# Patient Record
Sex: Male | Born: 1970
Health system: Southern US, Community
[De-identification: ages and names within clinical notes are randomized; demographics above are authoritative.]

## PROBLEM LIST (undated history)

## (undated) DIAGNOSIS — E785 Hyperlipidemia, unspecified: Secondary | ICD-10-CM

## (undated) DIAGNOSIS — Z87442 Personal history of urinary calculi: Secondary | ICD-10-CM

## (undated) DIAGNOSIS — I10 Essential (primary) hypertension: Secondary | ICD-10-CM

## (undated) DIAGNOSIS — K219 Gastro-esophageal reflux disease without esophagitis: Secondary | ICD-10-CM

## (undated) DIAGNOSIS — I472 Ventricular tachycardia, unspecified: Secondary | ICD-10-CM

## (undated) DIAGNOSIS — E039 Hypothyroidism, unspecified: Secondary | ICD-10-CM

## (undated) DIAGNOSIS — M169 Osteoarthritis of hip, unspecified: Secondary | ICD-10-CM

## (undated) DIAGNOSIS — J40 Bronchitis, not specified as acute or chronic: Secondary | ICD-10-CM

## (undated) DIAGNOSIS — D759 Disease of blood and blood-forming organs, unspecified: Secondary | ICD-10-CM

## (undated) DIAGNOSIS — Z973 Presence of spectacles and contact lenses: Secondary | ICD-10-CM

## (undated) DIAGNOSIS — F32A Depression, unspecified: Secondary | ICD-10-CM

## (undated) DIAGNOSIS — F1010 Alcohol abuse, uncomplicated: Secondary | ICD-10-CM

## (undated) DIAGNOSIS — E538 Deficiency of other specified B group vitamins: Secondary | ICD-10-CM

## (undated) HISTORY — DX: Essential (primary) hypertension: I10

## (undated) HISTORY — DX: Depression, unspecified: F32.A

## (undated) HISTORY — DX: Ventricular tachycardia: I47.2

## (undated) HISTORY — DX: Alcohol abuse, uncomplicated: F10.10

## (undated) HISTORY — DX: Deficiency of other specified B group vitamins: E53.8

## (undated) HISTORY — DX: Bronchitis, not specified as acute or chronic: J40

## (undated) HISTORY — PX: OTHER SURGICAL HISTORY: SHX169

## (undated) HISTORY — DX: Hypothyroidism, unspecified: E03.9

## (undated) HISTORY — PX: TOTAL HIP ARTHROPLASTY: SHX124

## (undated) HISTORY — DX: Ventricular tachycardia, unspecified: I47.20

## (undated) HISTORY — DX: Hyperlipidemia, unspecified: E78.5

---

## 1998-07-18 ENCOUNTER — Ambulatory Visit (HOSPITAL_COMMUNITY): Admission: RE | Admit: 1998-07-18 | Discharge: 1998-07-18 | Payer: Self-pay

## 2003-07-05 ENCOUNTER — Encounter: Admission: RE | Admit: 2003-07-05 | Discharge: 2003-07-05 | Payer: Self-pay | Admitting: Family Medicine

## 2006-03-08 ENCOUNTER — Inpatient Hospital Stay (HOSPITAL_COMMUNITY): Admission: RE | Admit: 2006-03-08 | Discharge: 2006-03-11 | Payer: Self-pay | Admitting: Orthopedic Surgery

## 2006-03-13 ENCOUNTER — Encounter: Admission: RE | Admit: 2006-03-13 | Discharge: 2006-05-14 | Payer: Self-pay | Admitting: Orthopedic Surgery

## 2010-03-12 ENCOUNTER — Encounter: Payer: Self-pay | Admitting: Family Medicine

## 2010-04-11 ENCOUNTER — Inpatient Hospital Stay (HOSPITAL_COMMUNITY)
Admission: EM | Admit: 2010-04-11 | Discharge: 2010-04-13 | DRG: 310 | Disposition: A | Discharge status: Home / Self Care | Payer: 59 | Attending: Cardiology | Admitting: Cardiology

## 2010-04-11 DIAGNOSIS — I10 Essential (primary) hypertension: Secondary | ICD-10-CM | POA: Diagnosis present

## 2010-04-11 DIAGNOSIS — I472 Ventricular tachycardia, unspecified: Principal | ICD-10-CM | POA: Diagnosis present

## 2010-04-11 DIAGNOSIS — F1010 Alcohol abuse, uncomplicated: Secondary | ICD-10-CM | POA: Diagnosis present

## 2010-04-11 DIAGNOSIS — F17200 Nicotine dependence, unspecified, uncomplicated: Secondary | ICD-10-CM | POA: Diagnosis present

## 2010-04-11 DIAGNOSIS — IMO0001 Reserved for inherently not codable concepts without codable children: Secondary | ICD-10-CM

## 2010-04-11 DIAGNOSIS — R002 Palpitations: Secondary | ICD-10-CM

## 2010-04-11 DIAGNOSIS — I4891 Unspecified atrial fibrillation: Secondary | ICD-10-CM | POA: Diagnosis present

## 2010-04-11 DIAGNOSIS — I4729 Other ventricular tachycardia: Principal | ICD-10-CM | POA: Diagnosis present

## 2010-04-11 DIAGNOSIS — E039 Hypothyroidism, unspecified: Secondary | ICD-10-CM | POA: Diagnosis present

## 2010-04-11 DIAGNOSIS — R5381 Other malaise: Secondary | ICD-10-CM

## 2010-04-11 DIAGNOSIS — R5383 Other fatigue: Secondary | ICD-10-CM

## 2010-04-11 DIAGNOSIS — E785 Hyperlipidemia, unspecified: Secondary | ICD-10-CM | POA: Diagnosis present

## 2010-04-11 DIAGNOSIS — E876 Hypokalemia: Secondary | ICD-10-CM | POA: Diagnosis present

## 2010-04-11 DIAGNOSIS — I959 Hypotension, unspecified: Secondary | ICD-10-CM | POA: Diagnosis present

## 2010-04-11 LAB — POCT I-STAT, CHEM 8
BUN: 6 mg/dL (ref 6–23)
Calcium, Ion: 1.03 mmol/L — ABNORMAL LOW (ref 1.12–1.32)
Chloride: 104 mEq/L (ref 96–112)
Creatinine, Ser: 0.9 mg/dL (ref 0.4–1.5)
Glucose, Bld: 133 mg/dL — ABNORMAL HIGH (ref 70–99)
HCT: 42 % (ref 39.0–52.0)
Hemoglobin: 14.3 g/dL (ref 13.0–17.0)
Potassium: 2.8 mEq/L — ABNORMAL LOW (ref 3.5–5.1)
Sodium: 137 mEq/L (ref 135–145)
TCO2: 19 mmol/L (ref 0–100)

## 2010-04-11 LAB — TROPONIN I: Troponin I: 0.08 ng/mL — ABNORMAL HIGH (ref 0.00–0.06)

## 2010-04-11 LAB — MRSA PCR SCREENING: MRSA by PCR: NEGATIVE

## 2010-04-11 LAB — CK TOTAL AND CKMB (NOT AT ARMC): Relative Index: 0.7 (ref 0.0–2.5)

## 2010-04-11 LAB — POCT CARDIAC MARKERS
CKMB, poc: 1.4 ng/mL (ref 1.0–8.0)
Myoglobin, poc: 116 ng/mL (ref 12–200)

## 2010-04-12 ENCOUNTER — Encounter: Payer: Self-pay | Admitting: Internal Medicine

## 2010-04-12 DIAGNOSIS — I4729 Other ventricular tachycardia: Secondary | ICD-10-CM

## 2010-04-12 DIAGNOSIS — I472 Ventricular tachycardia: Secondary | ICD-10-CM

## 2010-04-12 DIAGNOSIS — I369 Nonrheumatic tricuspid valve disorder, unspecified: Secondary | ICD-10-CM

## 2010-04-12 LAB — T4, FREE: Free T4: 1.47 ng/dL (ref 0.80–1.80)

## 2010-04-12 LAB — CARDIAC PANEL(CRET KIN+CKTOT+MB+TROPI)
CK, MB: 3.5 ng/mL (ref 0.3–4.0)
CK, MB: 3.5 ng/mL (ref 0.3–4.0)
Relative Index: 0.8 (ref 0.0–2.5)
Total CK: 433 U/L — ABNORMAL HIGH (ref 7–232)

## 2010-04-12 LAB — BASIC METABOLIC PANEL
Calcium: 8.1 mg/dL — ABNORMAL LOW (ref 8.4–10.5)
Creatinine, Ser: 0.94 mg/dL (ref 0.4–1.5)
GFR calc Af Amer: >60 mL/min (ref >60)
GFR calc non Af Amer: >60 mL/min (ref >60)
Glucose, Bld: 102 mg/dL — ABNORMAL HIGH (ref 70–99)
Sodium: 137 mEq/L (ref 135–145)

## 2010-04-12 LAB — TSH
TSH: 4.316 u[IU]/mL (ref 0.350–4.500)
TSH: 4.298 u[IU]/mL (ref 0.350–4.500)

## 2010-04-12 LAB — T3, FREE: T3, Free: 3.2 pg/mL (ref 2.3–4.2)

## 2010-04-13 LAB — BASIC METABOLIC PANEL
BUN: 5 mg/dL — ABNORMAL LOW (ref 6–23)
Calcium: 8.9 mg/dL (ref 8.4–10.5)
Creatinine, Ser: 0.98 mg/dL (ref 0.4–1.5)
GFR calc non Af Amer: >60 mL/min (ref >60)
Glucose, Bld: 120 mg/dL — ABNORMAL HIGH (ref 70–99)
Potassium: 3.8 mEq/L (ref 3.5–5.1)

## 2010-04-13 LAB — CBC
MCHC: 33.7 g/dL (ref 30.0–36.0)
Platelets: 178 10*3/uL (ref 150–400)
RDW: 13 % (ref 11.5–15.5)
WBC: 4.8 10*3/uL (ref 4.0–10.5)

## 2010-04-14 ENCOUNTER — Ambulatory Visit (HOSPITAL_COMMUNITY)
Admission: RE | Admit: 2010-04-14 | Discharge: 2010-04-14 | Disposition: A | Discharge status: Home / Self Care | Payer: 59 | Source: Ambulatory Visit | Attending: Cardiology | Admitting: Cardiology

## 2010-04-14 ENCOUNTER — Telehealth (INDEPENDENT_AMBULATORY_CARE_PROVIDER_SITE_OTHER): Payer: Self-pay | Admitting: *Deleted

## 2010-04-14 DIAGNOSIS — I4729 Other ventricular tachycardia: Secondary | ICD-10-CM | POA: Insufficient documentation

## 2010-04-14 DIAGNOSIS — I472 Ventricular tachycardia, unspecified: Secondary | ICD-10-CM | POA: Insufficient documentation

## 2010-04-14 LAB — ALDOSTERONE: Aldosterone, Serum: <1 ng/dL

## 2010-04-14 MED ORDER — GADOPENTETATE DIMEGLUMINE 469.01 MG/ML IV SOLN
35.0000 mL | Freq: Once | INTRAVENOUS | Status: AC
  Administered 2010-04-14: 35 mL via INTRAVENOUS

## 2010-04-14 NOTE — H&P (Signed)
NAME:  Christian King, Christian King            ACCOUNT NO.:  [id number]  MEDICAL RECORD NO.:  [id number]           PATIENT TYPE:  E  LOCATION:  [facility]                         FACILITY:  [facility]  PHYSICIAN:  Madolyn Frieze. Jens Som, MD, FACCDATE OF BIRTH:  September 11, 1970  DATE OF ADMISSION:  04/11/2010 DATE OF DISCHARGE:                             HISTORY & PHYSICAL   PRIMARY CARDIOLOGIST:  New patient to [facility], currently being evaluated by Dr. Jens Som.  PRIMARY CARE PHYSICIAN:  Belva Agee, MD  CHIEF COMPLAINT:  Palpitations, lightheadedness, and flushed feeling.  HISTORY OF PRESENT ILLNESS:  This is a 40 year old gentleman with history of hypertension, hyperlipidemia, and questionable hypothyroidism who states he was in his usual state of health today until this afternoon when he developed feelings of hot flash, flushing, nausea, weakness, and a full sensation.  This morning, he had helped his fatherload furniture and then proceeded to eat a buffet lunch.  By the time he got home, it was around 2 o'clock, and he began to have a fullness in his stomach.  He went to pick up his son from school, and at that time, became mildly nauseous.  This was then followed by hot flashes or flushing sensation.  He and his son were getting their haircut when this happened.  He had to go outside because he felt like he was going to vomit.  He also noticed that he was having weakness.  Then, while driving, the patient states he noticed palpitations in his chest as well as a lightheadedness/dizziness.  He continued to home.  His palpitations increased in intensity and with the feeling of weakness and lightheadedness, he called EMS.    When EMS arrived, they found the patient to be in a wide complex tachycardia.   He was given two IV pushes of amiodarone 150 mg.  This was without relief.   In the emergency department, the patient still showed a wide complex tachycardia at a rate of 205 beats per minute.  His  systolic blood pressure was noted to be around 70.  Therefore, the patient received direct current cardioversion x1.  This broke the wide complex tachycardia, returning him to  atrial fibrillation with rates around 70 and systolic blood pressure around 110.  The patient was then placed on Cardizem and his heart rate remains between 90 to 110 beats per minute, still in atrial fibrillation.  The patient's followup EKG after shock did show atrial fibrillation  at a rate of 151 beats per minute.  Currently, telemetry reads atrial fibrillation at a rate of 110 beats per minute.  Initial lab work also revealed a potassium of 2.8.  The patient's symptoms are much improved.  He is still anxious but currently in no acute distress.  In speaking with the patient, he denies any dyspnea on exertion, orthopnea, PND, pedal edema, chest pain, history of syncope, or palpitations.  Cardiology was asked to evaluate this patient for further recommendations.    PAST MEDICAL HISTORY: 1. Hyperlipidemia. 2. Hypertension. 3. Questionable hypothyroidism. 4. Alcohol abuse. 5. Continued tobacco abuse. 6. Right hip surgery.  SOCIAL HISTORY:  The patient lives with his wife and  son.  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  He has a tobacco history and continues to smoke 2-3 cigarettes per day.  He does consume alcohol; this is at least a 6 pack a day or 6 shot of bourbon a day, or a bottle of wine a day depending on what the patient has around the house.  Drugs, no illicit drug use.  FAMILY HISTORY:  The patient's mother is alive but unsure of any coronary artery disease.  His father is alive without known coronary artery disease.  He has siblings with diabetes, but no known coronary artery disease.  ALLERGIES:  No known drug allergies.  HOME MEDICATIONS:  The patient unsure of dosage,  but does take lisinopril, Trilipix, lorazepam, and simvastatin.  REVIEW OF SYSTEMS:  All pertinent positives and negatives as stated in the HPI.  All other systems have been reviewed and are negative.  CODE STATUS:  Full.  PHYSICAL EXAMINATION:  VITAL SIGNS:  Pulse 110, respirations 28, blood pressure 104/78, O2 saturation 100% on 2 L. GENERAL:  This is a well-developed, well-nourished, middle-aged gentleman who appears slightly anxious.  He is in no acute distress. HEENT:  Normal. NECK:  Supple without bruit or JVD. HEART:  Regularly irregular without murmur, rub, or gallop noted. Pulses are 2+ and equal bilaterally. LUNGS:  Clear to auscultation bilaterally without wheezes, rales, or rhonchi. ABDOMEN:  Soft, nontender, positive bowel sounds x4. EXTREMITIES:  No clubbing, cyanosis, or edema. MUSCULOSKELETAL:  No joint deformities or effusions. NEURO:  Alert and oriented x3.  Cranial nerves II-XII grossly intact.  Radiology pending.  EKG as stated in the HPI.  Sodium 137, potassium 2.8, chloride 104, bicarb 19, BUN 6, creatinine 0.9.  Point-of-care markers negative x1.  ASSESSMENT AND PLAN:  This is a 40 year old gentleman with past medical history of hypertension, hyperlipidemia, and questionable hypothyroidism who presents with a wide complex tachycardia that appears to be right ventricular outflow tract ventricular tachycardia that converted to atrial fibrillation after direct current cardioversion.  Dr. Jens Som completed a bedside quick look echocardiogram that demonstrated normal left ventricular and right ventricular function.  He also reviewed the above with Dr. Graciela Husbands, electrophysiologist, by phone.  The plan was discussed and he will be admitted to the coronary care unit.  The patient's potassium will be supplemented and followed closely.  We will also check a magnesium level as we are unsure of the etiology of the hypokalemia at this point.  Question if this is related  to his significant alcohol history.  We will also check a serum renin and aldosterone level.  The patient was placed on Cardizem, and this will be continued along with IV heparin.  Hopefully, the patient will convert overnight to normal sinus rhythm.  We will check an echocardiogram as well as TSH. Electrophysiology evaluation has been scheduled for the morning.  We will keep the patient n.p.o. after midnight for further procedures per Electrophysiology recommendations.  With the patient's alcohol abuse on a daily basis, we will watch for withdrawal and prophylactically place the patient on a CIWA protocol.  Discussion for alcohol and tobacco cessation has been discussed with the wife and the patient, and they understand the importance of cessation.  Further treatment will be dependent upon the above.  Thank you for this evaluation.     Leonette Monarch, PA-C   ______________________________ Madolyn Frieze. Jens Som, MD,  [facility]    NB/MEDQ  D:  04/11/2010  T:  04/12/2010  Job:  [id number]  cc:   [facility] Belva Agee, MD  Electronically Signed by Alen Blew P.A. on 04/14/2010 03:23:44 PM Electronically Signed by Olga Millers MD [facility] on 04/14/2010 06:52:40 PM

## 2010-04-16 DIAGNOSIS — I4729 Other ventricular tachycardia: Secondary | ICD-10-CM

## 2010-04-16 DIAGNOSIS — I472 Ventricular tachycardia: Secondary | ICD-10-CM

## 2010-04-17 ENCOUNTER — Ambulatory Visit (HOSPITAL_COMMUNITY): Payer: 59 | Attending: Internal Medicine

## 2010-04-17 ENCOUNTER — Encounter: Payer: Self-pay | Admitting: Internal Medicine

## 2010-04-17 DIAGNOSIS — I4729 Other ventricular tachycardia: Secondary | ICD-10-CM | POA: Insufficient documentation

## 2010-04-17 DIAGNOSIS — R9430 Abnormal result of cardiovascular function study, unspecified: Secondary | ICD-10-CM

## 2010-04-17 DIAGNOSIS — I472 Ventricular tachycardia, unspecified: Secondary | ICD-10-CM | POA: Insufficient documentation

## 2010-04-17 DIAGNOSIS — R55 Syncope and collapse: Secondary | ICD-10-CM

## 2010-04-18 NOTE — Progress Notes (Signed)
Summary: Nuclear Pre-Procedure  Phone Note Outgoing Call Call back at [facility] Phone [phone]   Call placed by: Stanton Kidney, EMT-P,  April 14, 2010 12:25 PM Action Taken: Phone Call Completed Summary of Call: Left message with information on [employer] Information Sheet (see scanned document for details). Stanton Kidney, EMT-P  April 14, 2010 12:25 PM      Nuclear Med Background Indications for Stress Test: Evaluation for Ischemia, Post Hospital, Abnormal EKG   History: Echo  History Comments: 2/12 Echo: EF=50-55%, Abn septal motion   Symptoms: Dizziness, Light-Headedness, Near Syncope, Palpitations    Nuclear Pre-Procedure Cardiac Risk Factors: Hypertension, Lipids, Smoker

## 2010-04-20 LAB — RENIN: Renin Activity: 9 ng/mL/h — ABNORMAL HIGH (ref 0.25–5.82)

## 2010-04-26 DIAGNOSIS — I4729 Other ventricular tachycardia: Secondary | ICD-10-CM | POA: Insufficient documentation

## 2010-04-26 DIAGNOSIS — E538 Deficiency of other specified B group vitamins: Secondary | ICD-10-CM

## 2010-04-26 DIAGNOSIS — I10 Essential (primary) hypertension: Secondary | ICD-10-CM

## 2010-04-26 DIAGNOSIS — I472 Ventricular tachycardia, unspecified: Secondary | ICD-10-CM | POA: Insufficient documentation

## 2010-04-26 DIAGNOSIS — M109 Gout, unspecified: Secondary | ICD-10-CM

## 2010-04-26 DIAGNOSIS — E039 Hypothyroidism, unspecified: Secondary | ICD-10-CM | POA: Insufficient documentation

## 2010-04-26 DIAGNOSIS — E785 Hyperlipidemia, unspecified: Secondary | ICD-10-CM | POA: Insufficient documentation

## 2010-04-26 HISTORY — DX: Essential (primary) hypertension: I10

## 2010-04-26 HISTORY — DX: Deficiency of other specified B group vitamins: E53.8

## 2010-04-26 HISTORY — DX: Gout, unspecified: M10.9

## 2010-04-26 HISTORY — DX: Hyperlipidemia, unspecified: E78.5

## 2010-04-27 ENCOUNTER — Encounter (INDEPENDENT_AMBULATORY_CARE_PROVIDER_SITE_OTHER): Payer: 59 | Admitting: Internal Medicine

## 2010-04-27 ENCOUNTER — Encounter: Payer: Self-pay | Admitting: Internal Medicine

## 2010-04-27 ENCOUNTER — Other Ambulatory Visit: Payer: Self-pay | Admitting: Internal Medicine

## 2010-04-27 DIAGNOSIS — I10 Essential (primary) hypertension: Secondary | ICD-10-CM

## 2010-04-27 DIAGNOSIS — I4729 Other ventricular tachycardia: Secondary | ICD-10-CM

## 2010-04-27 DIAGNOSIS — I472 Ventricular tachycardia: Secondary | ICD-10-CM

## 2010-04-27 LAB — BASIC METABOLIC PANEL
BUN: 12 mg/dL (ref 6–23)
Calcium: 10.2 mg/dL (ref 8.4–10.5)
Creatinine, Ser: 1 mg/dL (ref 0.4–1.5)

## 2010-04-27 LAB — MAGNESIUM: Magnesium: 2.1 mg/dL (ref 1.5–2.5)

## 2010-04-27 NOTE — Assessment & Plan Note (Addendum)
Summary: Cardiology Nuclear Testing  Nuclear Med Background Indications for Stress Test: Evaluation for Ischemia, Post Hospital, Abnormal EKG   History: Echo  History Comments: 2/12 Echo: EF=50-55%, Abn septal motion   Symptoms: Dizziness, Light-Headedness, Near Syncope, Palpitations    Nuclear Pre-Procedure Cardiac Risk Factors: Hypertension, Lipids, Smoker Caffeine/Decaff Intake: none NPO After: 10:00 PM Lungs: clear IV 0.9% NS with Angio Cath: 18     IV Site: R Antecubital IV Started by: Stanton Kidney, EMT-P Chest Size (in) 40     Height (in): 68 Weight (lb): 169 BMI: 25.79 Tech Comments: Dr. Johney Frame wanted Wende Mott to be in the room while his exercise portion was being done. S.Weaver observed, with no problems.   Nuclear Med Study 1 or 2 day study:  1 day     Stress Test Type:  Stress Reading MD:  Dietrich Pates, MD     Referring MD:  J.Allred Resting Radionuclide:  Technetium 49m Tetrofosmin     Resting Radionuclide Dose:  11.0 mCi  Stress Radionuclide:  Technetium 51m Tetrofosmin     Stress Radionuclide Dose:  33.0 mCi   Stress Protocol Exercise Time (min):  8:16 min     Max HR:  176 bpm     Predicted Max HR:  179 bpm  Max Systolic BP: 164 mm Hg     Percent Max HR:  98.32 %     METS: 13.4 Rate Pressure Product:  18841    Stress Test Technologist:  Milana Na, EMT-P     Nuclear Technologist:  Doyne Keel, CNMT  Rest Procedure  Myocardial perfusion imaging was performed at rest 45 minutes following the intravenous administration of Technetium 50m Tetrofosmin.  Stress Procedure  The patient exercised for 8:16. The patient stopped due to fatigue and denied any chest pain.  There were non specific ST-T wave changes.  Technetium 64m Tetrofosmin was injected at peak exercise and myocardial perfusion imaging was performed after a brief delay.  QPS Raw Data Images:  Rest images were motion corrected.  Soft tissue (diaphragm) underlies heart. Stress Images:  Thinning in  the inferior wall (base, mid)  Otherwise normal perfusion. Rest Images:  Mild improvemnt in the inferior  base.  Otherwise no significant change. Subtraction (SDS):  No significant ischemia. Transient Ischemic Dilatation:  0.83  (Normal <1.22)  Lung/Heart Ratio:  0.34  (Normal <0.45)  Quantitative Gated Spect Images QGS EDV:  86 ml QGS ESV:  33 ml QGS EF:  62 %   Overall Impression  Exercise Capacity: Good exercise capacity. BP Response: Normal blood pressure response. Clinical Symptoms: No chest pain ECG Impression: No significant ST segment change suggestive of ischemia. Overall Impression: Probable normal perfusion and soft tissue attenuation (diaphragm)  Minimal change in the inferior base.  Does not appeart to be signif for ischemia.  No evidence of scar.  Appended Document: Cardiology Nuclear Testing low risk study please inform pt  Appended Document: Cardiology Nuclear Testing pt aware

## 2010-05-02 NOTE — Consult Note (Addendum)
Summary: Consultation Report  Consultation Report   Imported By: Earl Many 04/28/2010 10:36:47  _____________________________________________________________________  External Attachment:    Type:   Image     Comment:   External Document

## 2010-05-02 NOTE — Assessment & Plan Note (Signed)
Summary: eph. per rhonda pa.gd   Visit Type:  Follow-up Primary Provider:  Belva Agee   History of Present Illness: The patient presents today for routine electrophysiology followup. He reports doing very well since last being seen in our clinic.  He denies symptomatic VT.  The patient denies symptoms of palpitations, chest pain, shortness of breath, orthopnea, PND, lower extremity edema, dizziness, presyncope, syncope, or neurologic sequela. The patient is tolerating medications without difficulties and is otherwise without complaint today.   Current Medications (verified): 1)  Folic Acid 1 Mg Tabs (Folic Acid) .Marland Kitchen.. 1 By Mouth Daily 2)  Aspirin 81 Mg  Tabs (Aspirin) .Marland Kitchen.. 1 By Mouth Daily 3)  Magnesium Oxide 400 Mg Tabs (Magnesium Oxide) .Marland Kitchen.. 1 By Mouth Daily 4)  Metoprolol Succinate 50 Mg Xr24h-Tab (Metoprolol Succinate) .Marland Kitchen.. 1 By Mouth Daily 5)  Klor-Con 10 10 Meq Cr-Tabs (Potassium Chloride) .Marland Kitchen.. 1 By Mouth Daily 6)  Thiamine Hcl 100 Mg Tabs (Thiamine Hcl) .Marland Kitchen.. 1 By Mouth Daily 7)  Lisinopril 20 Mg Tabs (Lisinopril) .Marland Kitchen.. 1 By Mouth Daily 8)  Simvastatin 20 Mg Tabs (Simvastatin) .Marland Kitchen.. 1 By Mouth Every 3 Day 9)  Trilipix 45 Mg Cpdr (Choline Fenofibrate) .... Every 3 Days 10)  Vitamin D3 50000 Unit Caps (Cholecalciferol) .Marland Kitchen.. 1 By Mouth Weekly  Allergies (verified): No Known Drug Allergies  Past History:  Past Medical History: Reviewed history from 04/26/2010 and no changes required. VENTRICULAR TACHYCARDIA HYPERLIPIDEMIA  HYPERTENSION B12 DEFICIENCY GOUT HYPOTHYROIDISM  Past Surgical History: Reviewed history from 04/26/2010 and no changes required. 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      Social History:  The patient lives with his spouse.  He is unemployed.   He has a history of tobacco use and presently smokes 2-3 cigarettes per   day.  He consumes large amounts of alcohol at least a 6 packs of beer a  day or 6 shots of  bourbon a day, but has quit since hospital discharge.  He denies drug use.   Vital Signs:  Patient profile:   40 year old male Height:      68 inches Weight:      169 pounds BMI:     25.79 Pulse rate:   66 / minute Resp:     16 per minute BP sitting:   104 / 67  (right arm)  Vitals Entered By: Marrion Coy, CNA (April 27, 2010 10:36 AM)  Physical Exam  General:  Well developed, well nourished, in no acute distress. Head:  normocephalic and atraumatic Eyes:  PERRLA/EOM intact; conjunctiva and lids normal. Mouth:  Teeth, gums and palate normal. Oral mucosa normal. Neck:  supple Lungs:  Clear bilaterally to auscultation and percussion. Heart:  Non-displaced PMI, chest non-tender; regular rate and rhythm, S1, S2 without murmurs, rubs or gallops. Carotid upstroke normal, no bruit. Normal abdominal aortic size, no bruits. Femorals normal pulses, no bruits. Pedals normal pulses. No edema, no varicosities. Abdomen:  Bowel sounds positive; abdomen soft and non-tender without masses, organomegaly, or hernias noted. No hepatosplenomegaly. Msk:  mildly tender over 1st metatarsal L foot Extremities:  No clubbing or cyanosis. Neurologic:  Alert and oriented x 3.   Impression & Recommendations:  Problem # 1:  VENTRICULAR TACHYCARDIA (ICD-427.1) doing well after recent hospital discharge without any further symptoms of VT Echo, MRI, and GXT were all low risk. We discussed EP study and ablation as an option today.  He requests to continue conservative management with  metoprolol and will consider EP study if VT recurs. No driving x 6 months (pt aware) ETOH cessation we will recheck BMET and Mg today  Problem # 2:  HYPERTENSION (ICD-401.9) stable no changes  Other Orders: TLB-BMP (Basic Metabolic Panel-BMET) (80048-METABOL) TLB-Magnesium (Mg) (83735-MG)  Patient Instructions: 1)  Your physician recommends that you return for lab work in: bmet and mg today...we will call you with  results 2)  No driving or alcohol use 3)  Your physician recommends that you schedule a follow-up appointment in: 3 months with Dr.Rocklin Soderquist

## 2010-05-04 ENCOUNTER — Encounter: Payer: 59 | Admitting: Internal Medicine

## 2010-05-05 NOTE — Discharge Summary (Signed)
NAME:  Christian King, Christian King            ACCOUNT NO.:  [id number]  MEDICAL RECORD NO.:  [id number]           PATIENT TYPE:  I  LOCATION:  2913                         FACILITY:  [facility]  PHYSICIAN:  Hillis Range, MD       DATE OF BIRTH:  1970-11-25  DATE OF ADMISSION:  04/11/2010 DATE OF DISCHARGE:  04/13/2010                              DISCHARGE SUMMARY   PROCEDURES:  A 2-D echocardiogram.  PRIMARY FINAL DISCHARGE DIAGNOSIS:  Ventricular tachycardia, rule out left ventricular outflow tract.  SECONDARY DIAGNOSES: 1. History of ethyl alcohol abuse. 2. Hypertension. 3. Hyperlipidemia. 4. Questionable history of hypothyroidism with TFTs within normal     limits this admission. 5. Tobacco abuse. 6. Status post vascularized fibular graft to his right hip for     avascular necrosis. 7. History of gout. 8. History of B12 deficiency. 9. Family history of sudden death in the paternal great grandmother     and two paternal uncles, no further details available.  TIME AT DISCHARGE:  Forty eight minutes.  HOSPITAL COURSE:  Christian King is a 40 year old male with no previous history of coronary artery disease.  He came to the hospital on the day of admission for palpitations with lightheadedness and flushed feeling. He was given amiodarone by EMS and then became hypotensive.  At that time, his heart rate was 205.  He received direct current cardioversion and was been in atrial fibrillation.  He was placed on Cardizem.  His blood pressure improved.  His initial heart rate in the atrial fibrillation was 150, but was slowed by IV Cardizem.  He was admitted for further evaluation.  His thyroid function test was within normal limits.  His initial potassium was 2.8, but was supplemented.  He had some elevated blood sugars between 102 and 133, but no hemoglobin A1c was done.  Magnesium was 1.5.  his potassium was extensively supplemented and improved.  His potassium level was 3.8 at discharge.   Thyroid function tests were within normal limits.  He spontaneously converted to sinus rhythm.  He had a serum rennin and aldosterone level performed.  These are both pending at the time of dictation.  They can be followed as an outpatient.  He spontaneously converted to sinus rhythm and the Cardizem was discontinued.  Cardiac enzymes were cycled and he had some elevation in his CKs between 427 and 513.  His troponins ranged between 0.08 and 0.13.  His MBs were all within normal limits.  After he converted to sinus rhythm, the Cardizem drip was discontinued.  On April 12, 2010, he was seen by Dr. Johney Frame.  He had a signal average EKG, which had some abnormalities.  He had an echocardiogram to evaluate his left ventricular and right ventricular function.  His left ventricular ejection fraction was 50-55% with no wall motion abnormalities.  He had no significant valvular abnormalities.  The right ventricular wall thickness and function was normal.  He was started on a beta-blocker and his final dosage was Toprol-XL 50 mg a day.  It was recommended that he continue on potassium to keep his level greater than 4 and magnesium to keep  his level greater than 2.  A smoking cessation consult was called.  On April 13, 2010, he was evaluated by Dr. Johney Frame.  He had no further arrhythmias.  He had no symptoms of volume overloaded.  Signal average EKG was reviewed by Dr. Graciela Husbands and because of that, he will need cardiac MRI and an outpatient stress test.  Dr. Johney Frame felt that Christian King was stable for discharge on April 13, 2010 in improved condition with close followup arranged.  DISCHARGE INSTRUCTIONS: 1. His activity level is to be increased gradually with no lifting or     driving for 4 weeks. 2. He is to be at [facility] on April 14, 2010 at 8:34, cardiac     MRI.  He is to check in, it is moving. 3. He is not to use alcohol at all. 4. He is encouraged not to smoke. 5. He is to  get a stress test on Monday April 17, 2010 and be at     the office at 8:15. 6. He is not to eat or drink after midnight, no caffeine or decaf for     24 hours before and he is not to take a.m. meds. 7. He is to follow up with Dr. Johney Frame on May 04, 2010 at 3:15 and     with Dr. Tania Ade as needed.  DISCHARGE MEDICATIONS: 1. Lisinopril 20 mg a day. 2. Fish oil OTC two capsules daily as prior to admission. 3. Toprol-XL 50 mg a day. 4. Trilipix 45 mg q.3 days. 5. Simvastatin 20 mg every 3 days as prior to admission. 6. Aspirin 81 mg a day. 7. K-Dur 10 mEq daily. 8. Mag-Ox 400 mg daily. 9. Thiamine 100 mg daily. 10.Folic acid 1 mg daily. 11.Vitamin D2 50,000 units on Sundays.     Theodore Demark, PA-C   ______________________________ Hillis Range, MD    RB/MEDQ  D:  04/13/2010  T:  04/13/2010  Job:  [id number]  cc:   Dr. Belva Agee  Electronically Signed by Theodore Demark PA-C on 04/17/2010 03:29:37 PM Electronically Signed by Hillis Range MD on 05/05/2010 10:51:57 PM

## 2010-05-05 NOTE — Consult Note (Signed)
NAME:  Christian King, Christian King            ACCOUNT NO.:  [id number]  MEDICAL RECORD NO.:  [id number]           PATIENT TYPE:  LOCATION:                                 FACILITY:  PHYSICIAN:  Christian Range, MD       DATE OF BIRTH:  05/25/1970  DATE OF CONSULTATION: DATE OF DISCHARGE:                                CONSULTATION   REQUESTING PHYSICIAN:  Madolyn Frieze. Jens Som, MD, [facility]  REASON FOR CONSULTATION:  Wide-complex tachycardia.  HISTORY OF PRESENT ILLNESS:  Christian King is a pleasant 40 year old gentleman with a history of heavy alcohol dependence who was admitted on April 11, 2010, with a wide-complex tachycardia.  The patient reports being in his usual state of health until the afternoon when he began having diaphoresis, flushing, and nausea.  He subsequently developed tachy palpitations with associated lightheadedness and dizziness.  He presented to [facility] where he was found to have a wide- complex tachycardia at 205 beats per minute.  He was mildly hypotensive and therefore was cardioverted.  He cardioverted to atrial fibrillation but subsequently converted to sinus rhythm.  He has maintained sinus rhythm since that time.  Presently, he is resting comfortably and is without complaint.  He denies chest pain, shortness of breath, presyncope, syncope, or other concerns.  He denies any prior exertional symptoms.  He has not had syncope in the past.  HOME MEDICATIONS:  Lisinopril, Trilipix, lorazepam, and simvastatin.  ALLERGIES:  No known drug allergies.  PAST MEDICAL HISTORY: 1. Alcohol abuse. 2. Hypertension. 3. Hyperlipidemia. 4. Ongoing tobacco use. 5. Prior right hip surgery.  SOCIAL HISTORY:  The patient lives with his spouse.  He is unemployed. He has a history of tobacco use and presently smokes 2-3 cigarettes per day.  He consumes large amounts of alcohol at least a 6 packs of beer a day or 6 shots of bourbon a day.  He denies drug use.  FAMILY  HISTORY:  The patient's parents are healthy.  He has a sibling with diabetes but no known coronary disease.  His paternal great grandmother died suddenly at the young age.  He also has two paternal uncles who died suddenly in their 30s or 18s.  REVIEW OF SYSTEMS:  All systems were reviewed and negative except as outlined in the HPI above.  PHYSICAL EXAMINATION:  TELEMETRY:  Sinus rhythm at 74 beats per minute with no arrhythmias observed. VITALS:  Blood pressure 125/70, heart rate 74, respirations 16, sats 97% on room air, afebrile. GENERAL:  The patient is a well-appearing male in no acute distress.  He is alert and oriented x3. HEENT:  Normocephalic, atraumatic.  Sclerae clear.  Conjunctivae pink. Oropharynx clear. NECK:  Supple.  No thyromegaly, JVD, or bruits. LUNGS:  Clear to auscultation bilaterally. HEART:  Regular rate and rhythm.  No murmurs, rubs, or gallops. GI:  Soft, nontender, nondistended.  Positive bowel sounds. EXTREMITIES:  No clubbing, cyanosis, or edema. SKIN:  No ecchymoses or lacerations. MUSCULOSKELETAL:  No deformity or atrophy. PSYCHIATRIC:  Euthymic mood.  Full affect. NEUROLOGIC:  Strength and sensation are intact.  EKG from this morning reveals sinus bradycardia with  heart rate of 54 beats per minute with T-wave inversions in V1 through V3, without specific abnormalities observed.  The QTc is 124.  I reviewed an EKG during his tachycardia from April 11, 2010, at 1739, which reveals a wide-complex tachycardia.  According to VT criteria, this is more consistent with a supraventricular tachycardia than a ventricular tachycardia.  The tachycardia has a left bundle- branch inferior axis morphology and could be potentially consistent with a right ventricular outflow tract tachycardia.  Echo is pending.  Cardiac markers are reviewed.  Potassium now 3.6.  Upon initial presentation, his potassium was 2.8.  Creatinine is 0.9.  TSH 4.3.  IMPRESSION:  Mr.  King is a 40 year old gentleman with a history of alcohol dependence who now presents in the setting of hypokalemia with a wide-complex tachycardia.  I have reviewed the wide-complex tachycardia and discussed its morphology with Dr. Graciela Husbands.  We both agree that this is more of a supraventricular tachycardia morphology and therefore may represent an aberrantly conducted SVT; however, certainly the morphology could also be consistent with a right ventricular outflow tract ventricular tachycardia.  At this time, I think the most prudent step is to obtain an echocardiogram to evaluate for structural heart disease. We will also obtain a signal averaged EKG.  If these are normal, then I think the patient can be discharged and have an outpatient stress Myoview.  If his signal average EKG or echo revealed high-risk features for a right ventricular dysplasia, then we could proceed with cardiac MRI at that time.  I have initiated the patient on Toprol-XL 50 mg daily today.  We will replete his magnesium and potassium.  In addition, I have made it very clear that the patient should make efforts to discontinue alcohol consumption.  I have also been very clear that the patient should not drive until he follows up with me in the office.  I will see the patient again in the office in 4 weeks.  He will contact our office immediately should he have syncope or any other problems.     Christian Range, MD     JA/MEDQ  D:  04/12/2010  T:  04/12/2010  Job:  [id number]  cc:   Madolyn Frieze. Jens Som, MD, [facility]  Electronically Signed by Christian Range MD on 05/05/2010 10:52:01 PM

## 2010-05-16 ENCOUNTER — Telehealth: Payer: Self-pay | Admitting: Internal Medicine

## 2010-05-16 NOTE — Telephone Encounter (Signed)
Pt needs metoprolol [facility]# [phone]

## 2010-05-17 ENCOUNTER — Other Ambulatory Visit: Payer: Self-pay

## 2010-05-17 DIAGNOSIS — I10 Essential (primary) hypertension: Secondary | ICD-10-CM

## 2010-05-17 MED ORDER — METOPROLOL SUCCINATE ER 50 MG PO TB24: 50.0000 mg | ORAL_TABLET | Freq: Every day | ORAL | Status: DC

## 2010-05-17 NOTE — Telephone Encounter (Signed)
Pt wife states pt needs meds to be called pt does not have anymore meds. Pt wife # [phone] wants to know when rx is called in.

## 2010-05-18 ENCOUNTER — Other Ambulatory Visit: Payer: Self-pay | Admitting: *Deleted

## 2010-05-18 MED ORDER — MAGNESIUM OXIDE 400 MG PO TABS: 400.0000 mg | ORAL_TABLET | Freq: Every day | ORAL | Status: AC

## 2010-06-14 ENCOUNTER — Telehealth: Payer: Self-pay | Admitting: Internal Medicine

## 2010-06-14 DIAGNOSIS — I10 Essential (primary) hypertension: Secondary | ICD-10-CM

## 2010-06-14 NOTE — Telephone Encounter (Signed)
Pt needs metoprolol to be called in to [facility] [address] # [phone].

## 2010-06-15 ENCOUNTER — Other Ambulatory Visit: Payer: Self-pay | Admitting: Internal Medicine

## 2010-06-15 DIAGNOSIS — I10 Essential (primary) hypertension: Secondary | ICD-10-CM

## 2010-06-15 MED ORDER — METOPROLOL SUCCINATE ER 50 MG PO TB24: 50.0000 mg | ORAL_TABLET | Freq: Every day | ORAL | Status: DC

## 2010-06-19 MED ORDER — METOPROLOL SUCCINATE ER 50 MG PO TB24: 50.0000 mg | ORAL_TABLET | Freq: Every day | ORAL | Status: DC

## 2010-06-19 NOTE — Telephone Encounter (Signed)
Metoprolol 50 mg. [facility] - store [phone]. Fax [phone].

## 2010-06-24 ENCOUNTER — Encounter: Payer: Self-pay | Admitting: Nurse Practitioner

## 2010-07-04 ENCOUNTER — Other Ambulatory Visit: Payer: Self-pay | Admitting: *Deleted

## 2010-07-04 MED ORDER — POTASSIUM CHLORIDE 10 MEQ PO TBCR: 10.0000 meq | EXTENDED_RELEASE_TABLET | Freq: Every day | ORAL | Status: DC

## 2010-07-14 ENCOUNTER — Encounter: Payer: Self-pay | Admitting: Internal Medicine

## 2010-07-24 ENCOUNTER — Encounter: Payer: Self-pay | Admitting: Internal Medicine

## 2010-07-24 ENCOUNTER — Ambulatory Visit (INDEPENDENT_AMBULATORY_CARE_PROVIDER_SITE_OTHER): Payer: 59 | Admitting: Internal Medicine

## 2010-07-24 VITALS — BP 124/88 | HR 72 | Resp 16 | Ht 68.0 in | Wt 176.0 lb

## 2010-07-24 DIAGNOSIS — I4729 Other ventricular tachycardia: Secondary | ICD-10-CM

## 2010-07-24 DIAGNOSIS — I10 Essential (primary) hypertension: Secondary | ICD-10-CM

## 2010-07-24 DIAGNOSIS — E785 Hyperlipidemia, unspecified: Secondary | ICD-10-CM

## 2010-07-24 DIAGNOSIS — F1010 Alcohol abuse, uncomplicated: Secondary | ICD-10-CM

## 2010-07-24 DIAGNOSIS — I472 Ventricular tachycardia: Secondary | ICD-10-CM

## 2010-07-24 HISTORY — DX: Alcohol abuse, uncomplicated: F10.10

## 2010-07-24 LAB — BASIC METABOLIC PANEL
Calcium: 9.7 mg/dL (ref 8.4–10.5)
Chloride: 102 mEq/L (ref 96–112)
Creatinine, Ser: 0.8 mg/dL (ref 0.4–1.5)
GFR: 109.4 mL/min (ref >60.00)

## 2010-07-24 LAB — MAGNESIUM: Magnesium: 2 mg/dL (ref 1.5–2.5)

## 2010-07-24 NOTE — Assessment & Plan Note (Signed)
Complete cessation advised 

## 2010-07-24 NOTE — Assessment & Plan Note (Signed)
Stable No change required today  

## 2010-07-24 NOTE — Progress Notes (Signed)
The patient presents today for routine electrophysiology followup.  Since last being seen in our clinic, the patient reports doing very well.  He has had no further palpitations or symptoms of VT.  Unfortunately, he continues to drink ETOH intemittently.  Today, he denies symptoms of palpitations, chest pain, shortness of breath, orthopnea, PND, lower extremity edema, dizziness, presyncope, syncope, or neurologic sequela.  The patient feels that he is tolerating medications without difficulties and is otherwise without complaint today.   Past Medical History  Diagnosis Date  . Ventricular tachycardia     in setting of hypokalemia/ hypomagnesemia and ETOH  . HLD (hyperlipidemia)   . HTN (hypertension)   . B12 deficiency   . Gout   . Hypothyroidism   . Alcohol abuse    Past Surgical History  Procedure Date  . Vascularized fibular graft     right hip for avascular necrosis    Current Outpatient Prescriptions  Medication Sig Dispense Refill  . aspirin 81 MG tablet Take 81 mg by mouth daily.        . Cholecalciferol (VITAMIN D) 2000 UNITS CAPS Take by mouth.        . Choline Fenofibrate (TRILIPIX) 135 MG capsule Take 135 mg by mouth daily.        . folic acid (FOLVITE) 1 MG tablet Take 1 mg by mouth daily.        Marland Kitchen lisinopril (PRINIVIL,ZESTRIL) 20 MG tablet Take 20 mg by mouth daily.        Marland Kitchen LORazepam (ATIVAN) 2 MG tablet Take 2 mg by mouth. One po q hs prn        . magnesium oxide (MAG-OX 400) 400 MG tablet Take 1 tablet (400 mg total) by mouth daily.  90 tablet  3  . metoprolol (TOPROL XL) 50 MG 24 hr tablet Take 1 tablet (50 mg total) by mouth daily.  30 tablet  11  . potassium chloride (KLOR-CON) 10 MEQ CR tablet Take 1 tablet (10 mEq total) by mouth daily.  30 tablet  3  . simvastatin (ZOCOR) 20 MG tablet Take 20 mg by mouth as directed.        . thiamine 100 MG tablet Take 100 mg by mouth daily.        Marland Kitchen DISCONTD: fish oil-omega-3 fatty acids 1000 MG capsule Take 2 g by mouth  daily. 2 po bid        . DISCONTD: simvastatin (ZOCOR) 40 MG tablet Take 40 mg by mouth. One po q3days         Allergies  Allergen Reactions  . Hctz (Hydrochlorothiazide) Rash    History   Social History  . Marital Status: Married    Spouse Name: N/A    Number of Children: N/A  . Years of Education: N/A   Occupational History  . Not on file.   Social History Main Topics  . Smoking status: Former Smoker -- 0.2 packs/day    Quit date: 02/19/2010  . Smokeless tobacco: Not on file  . Alcohol Use: Yes     was drinking 6 beers or shots or more a day - quit in hosp  . Drug Use: No  . Sexually Active: Not on file   Other Topics Concern  . Not on file   Social History Narrative  . No narrative on file   Physical Exam: Filed Vitals:   07/24/10 1000  BP: 124/88  Pulse: 72  Resp: 16  Height: 5\' 8"  (1.727 m)  Weight: 176 lb (79.833 kg)    GEN- The patient is well appearing, alert and oriented x 3 today.   Head- normocephalic, atraumatic Eyes-  Sclera clear, conjunctiva pink Ears- hearing intact Oropharynx- clear Neck- supple, no JVP Lymph- no cervical lymphadenopathy Lungs- Clear to ausculation bilaterally, normal work of breathing Heart- Regular rate and rhythm, no murmurs, rubs or gallops, PMI not laterally displaced GI- soft, NT, ND, + BS Extremities- no clubbing, cyanosis, or edema MS- no significant deformity or atrophy Skin- no rash or lesion Psych- euthymic mood, full affect Neuro- strength and sensation are intact  Assessment and Plan:

## 2010-07-24 NOTE — Patient Instructions (Signed)
Your physician wants you to follow-up in: 6 months with Dr Jacquiline Doe will receive a reminder letter in the mail two months in advance. If you don't receive a letter, please call our office to schedule the follow-up appointment.  Your physician recommends that you return for lab work today BMP/Mag

## 2010-07-24 NOTE — Assessment & Plan Note (Signed)
Stable, without recurrence Prior episode occurred in setting of heavy ETOH with K and Mg depletion.  Echo/ cardiac MRI/ GXT nuclear study were all unrevealing.  Continue Toprol He is aware to refrain from driving until 6 months s/p prior VT.   He will contact my office if further episodes arise.

## 2010-07-28 ENCOUNTER — Telehealth: Payer: Self-pay | Admitting: Internal Medicine

## 2010-07-28 NOTE — Telephone Encounter (Signed)
Pt aware of lab results 

## 2010-07-28 NOTE — Telephone Encounter (Signed)
Pt had lab work on Monday, He would like the results called to him.

## 2010-08-22 ENCOUNTER — Telehealth: Payer: Self-pay | Admitting: *Deleted

## 2010-08-22 NOTE — Telephone Encounter (Signed)
Called patient with lab results from June.

## 2010-10-27 ENCOUNTER — Other Ambulatory Visit: Payer: Self-pay | Admitting: Internal Medicine

## 2011-02-05 ENCOUNTER — Ambulatory Visit: Payer: 59 | Admitting: Internal Medicine

## 2011-02-08 ENCOUNTER — Encounter: Payer: Self-pay | Admitting: Internal Medicine

## 2011-02-08 ENCOUNTER — Ambulatory Visit (INDEPENDENT_AMBULATORY_CARE_PROVIDER_SITE_OTHER): Payer: 59 | Admitting: Internal Medicine

## 2011-02-08 VITALS — BP 120/76 | HR 86 | Resp 18 | Ht 68.0 in | Wt 177.4 lb

## 2011-02-08 DIAGNOSIS — I472 Ventricular tachycardia, unspecified: Secondary | ICD-10-CM

## 2011-02-08 DIAGNOSIS — I4729 Other ventricular tachycardia: Secondary | ICD-10-CM

## 2011-02-08 LAB — BASIC METABOLIC PANEL
CO2: 27 mEq/L (ref 19–32)
Calcium: 9.6 mg/dL (ref 8.4–10.5)
Creatinine, Ser: 1 mg/dL (ref 0.4–1.5)
GFR: 92.07 mL/min (ref >60.00)
Sodium: 139 mEq/L (ref 135–145)

## 2011-02-08 LAB — MAGNESIUM: Magnesium: 1.9 mg/dL (ref 1.5–2.5)

## 2011-02-08 NOTE — Progress Notes (Signed)
Addended by: Dennis Bast F on: 02/08/2011 02:49 PM   Modules accepted: Orders

## 2011-02-08 NOTE — Assessment & Plan Note (Signed)
Stable, without recurrence Prior episode occurred in setting of heavy ETOH with K and Mg depletion.  Echo/ cardiac MRI/ GXT nuclear study were all unrevealing.  Continue Toprol  He will contact my office if further episodes arise.

## 2011-02-08 NOTE — Progress Notes (Signed)
The patient presents today for routine electrophysiology followup.  Since last being seen in our clinic, the patient reports doing very well.  He has had no further palpitations or symptoms of VT.  Unfortunately, he continues to drink ETOH intemittently.  Today, he denies symptoms of palpitations, chest pain, shortness of breath, orthopnea, PND, lower extremity edema, dizziness, presyncope, syncope, or neurologic sequela.  The patient feels that he is tolerating medications without difficulties and is otherwise without complaint today.   Past Medical History  Diagnosis Date  . Ventricular tachycardia     in setting of hypokalemia/ hypomagnesemia and ETOH  . HLD (hyperlipidemia)   . HTN (hypertension)   . B12 deficiency   . Gout   . Hypothyroidism   . Alcohol abuse    Past Surgical History  Procedure Date  . Vascularized fibular graft     right hip for avascular necrosis    Current Outpatient Prescriptions  Medication Sig Dispense Refill  . aspirin 81 MG tablet Take 81 mg by mouth daily.        . Cholecalciferol (VITAMIN D) 2000 UNITS CAPS Take by mouth.        . folic acid (FOLVITE) 1 MG tablet Take 1 mg by mouth daily.        Marland Kitchen lisinopril (PRINIVIL,ZESTRIL) 20 MG tablet Take 20 mg by mouth daily.        Marland Kitchen LORazepam (ATIVAN) 2 MG tablet Take 2 mg by mouth. One po q hs prn        . magnesium oxide (MAG-OX 400) 400 MG tablet Take 1 tablet (400 mg total) by mouth daily.  90 tablet  3  . metoprolol (TOPROL XL) 50 MG 24 hr tablet Take 1 tablet (50 mg total) by mouth daily.  30 tablet  11  . potassium chloride (K-DUR) 10 MEQ tablet TAKE ONE TABLET BY MOUTH EVERY DAY  30 tablet  6  . simvastatin (ZOCOR) 20 MG tablet Take 20 mg by mouth as directed.        . thiamine 100 MG tablet Take 100 mg by mouth daily.          Allergies  Allergen Reactions  . Hctz (Hydrochlorothiazide) Rash    History   Social History  . Marital Status: Married    Spouse Name: N/A    Number of Children: N/A   . Years of Education: N/A   Occupational History  . Not on file.   Social History Main Topics  . Smoking status: Former Smoker -- 0.2 packs/day    Quit date: 02/19/2010  . Smokeless tobacco: Not on file  . Alcohol Use: Yes     was drinking 6 beers or shots or more a day - now drinks no wine and drinks several beers/burboun most days  . Drug Use: No  . Sexually Active: Not on file   Other Topics Concern  . Not on file   Social History Narrative   Unemployed.  Previously a Automotive engineer   Physical Exam: Filed Vitals:   02/08/11 1149  BP: 120/76  Pulse: 86  Resp: 18  Height: 5\' 8"  (1.727 m)  Weight: 177 lb 6.4 oz (80.468 kg)    GEN- The patient is well appearing, alert and oriented x 3 today.   Head- normocephalic, atraumatic Eyes-  Sclera clear, conjunctiva pink Ears- hearing intact Oropharynx- clear Neck- supple, no JVP Lymph- no cervical lymphadenopathy Lungs- Clear to ausculation bilaterally, normal work of breathing Heart- Regular rate  and rhythm, no murmurs, rubs or gallops, PMI not laterally displaced GI- soft, NT, ND, + BS Extremities- no clubbing, cyanosis, or edema MS- no significant deformity or atrophy  ekg today reveals sinus rhythm 86 bpm, nonspecific ST/T changes  Assessment and Plan:

## 2011-02-08 NOTE — Patient Instructions (Signed)
Your physician wants you to follow-up in: 12 months with Dr Jacquiline Doe will receive a reminder letter in the mail two months in advance. If you don't receive a letter, please call our office to schedule the follow-up appointment.   Your physician recommends that you return for lab work today  BMP/MAG

## 2011-02-22 ENCOUNTER — Telehealth: Payer: Self-pay | Admitting: Internal Medicine

## 2011-02-22 NOTE — Telephone Encounter (Signed)
Informed patient of results/KLanier,RN  

## 2011-02-22 NOTE — Telephone Encounter (Signed)
Pt would like a call to get his lab results

## 2011-10-15 ENCOUNTER — Other Ambulatory Visit: Payer: Self-pay | Admitting: Internal Medicine

## 2011-11-16 ENCOUNTER — Telehealth: Payer: Self-pay | Admitting: Internal Medicine

## 2011-11-16 NOTE — Telephone Encounter (Signed)
He does not have CAD.  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  I have told him if there is a problem to call me and we can always write him a letter or he can sign a release and we can send his medical records

## 2011-11-16 NOTE — Telephone Encounter (Signed)
plz return call to patient at [phone] regarding medical questions.

## 2011-11-29 ENCOUNTER — Other Ambulatory Visit: Payer: Self-pay

## 2011-11-29 MED ORDER — POTASSIUM CHLORIDE ER 10 MEQ PO TBCR: 10.0000 meq | EXTENDED_RELEASE_TABLET | Freq: Every day | ORAL | Status: DC

## 2012-05-29 ENCOUNTER — Other Ambulatory Visit: Payer: Self-pay | Admitting: Nurse Practitioner

## 2012-06-05 ENCOUNTER — Other Ambulatory Visit: Payer: Self-pay | Admitting: Nurse Practitioner

## 2012-06-05 NOTE — Telephone Encounter (Signed)
Last filled 03/21/12, last seen 12/05/11. If approved call into [facility]

## 2012-06-05 NOTE — Telephone Encounter (Signed)
OK to refill Lorazepam X1 NTBS for future refills

## 2012-07-02 ENCOUNTER — Other Ambulatory Visit: Payer: Self-pay | Admitting: Nurse Practitioner

## 2012-07-11 ENCOUNTER — Other Ambulatory Visit: Payer: Self-pay | Admitting: Nurse Practitioner

## 2012-07-23 ENCOUNTER — Ambulatory Visit (INDEPENDENT_AMBULATORY_CARE_PROVIDER_SITE_OTHER): Payer: 59 | Admitting: Internal Medicine

## 2012-07-23 ENCOUNTER — Encounter: Payer: Self-pay | Admitting: Internal Medicine

## 2012-07-23 VITALS — BP 125/88 | HR 103 | Ht 67.0 in | Wt 170.0 lb

## 2012-07-23 DIAGNOSIS — I472 Ventricular tachycardia, unspecified: Secondary | ICD-10-CM

## 2012-07-23 DIAGNOSIS — I4729 Other ventricular tachycardia: Secondary | ICD-10-CM

## 2012-07-23 DIAGNOSIS — F1010 Alcohol abuse, uncomplicated: Secondary | ICD-10-CM

## 2012-07-23 MED ORDER — METOPROLOL SUCCINATE ER 50 MG PO TB24: 50.0000 mg | ORAL_TABLET | Freq: Every day | ORAL | Status: DC

## 2012-07-23 MED ORDER — POTASSIUM CHLORIDE ER 10 MEQ PO TBCR: 10.0000 meq | EXTENDED_RELEASE_TABLET | Freq: Every day | ORAL | Status: DC

## 2012-07-23 NOTE — Progress Notes (Signed)
PCP: Mathis Fare, OTR  The patient presents today for routine electrophysiology followup.  Since last being seen in our clinic, the patient reports doing very well.  He has had no further palpitations or symptoms of VT.  Unfortunately, he continues to drink ETOH (2-3 beers per day).  He did have an episode of dizziness several weeks ago while doing yard work.  EMS was called and noted that he had tachycardia 130s which gradually terminated.  He reports that they did an ekg and did not feel that he warranted hospital eval.  He has had no other episodes.   Today, he denies symptoms of palpitations, chest pain, shortness of breath, orthopnea, PND, lower extremity edema, presyncope, syncope, or neurologic sequela.  The patient feels that he is tolerating medications without difficulties and is otherwise without complaint today.   Past Medical History  Diagnosis Date  . Ventricular tachycardia     in setting of hypokalemia/ hypomagnesemia and ETOH  . HLD (hyperlipidemia)   . HTN (hypertension)   . B12 deficiency   . Gout   . Hypothyroidism   . Alcohol abuse    Past Surgical History  Procedure Laterality Date  . Vascularized fibular graft      right hip for avascular necrosis    Current Outpatient Prescriptions  Medication Sig Dispense Refill  . aspirin 81 MG tablet Take 81 mg by mouth daily.        . Cholecalciferol (VITAMIN D) 2000 UNITS CAPS Take by mouth.        . fenofibrate micronized (ANTARA) 130 MG capsule TAKE ONE CAPSULE BY MOUTH ONCE DAILY NEEDS  TO  BE  SEEN  30 capsule  0  . folic acid (FOLVITE) 1 MG tablet Take 1 mg by mouth daily.        Marland Kitchen lisinopril (PRINIVIL,ZESTRIL) 20 MG tablet TAKE ONE TABLET BY MOUTH ONCE DAILY  30 tablet  0  . LORazepam (ATIVAN) 2 MG tablet TAKE ONE TABLET BY MOUTH AT BEDTIME  30 tablet  0  . metoprolol succinate (TOPROL-XL) 50 MG 24 hr tablet Take 1 tablet (50 mg total) by mouth daily. Take with or immediately following a meal.  90 tablet  3  .  potassium chloride (K-DUR) 10 MEQ tablet Take 1 tablet (10 mEq total) by mouth daily.  90 tablet  3  . simvastatin (ZOCOR) 20 MG tablet Take 20 mg by mouth as directed.        . thiamine 100 MG tablet Take 100 mg by mouth daily.         No current facility-administered medications for this visit.    Allergies  Allergen Reactions  . Hctz (Hydrochlorothiazide) Rash    History   Social History  . Marital Status: Married    Spouse Name: N/A    Number of Children: N/A  . Years of Education: N/A   Occupational History  . Not on file.   Social History Main Topics  . Smoking status: Former Smoker -- 0.20 packs/day    Quit date: 02/19/2010  . Smokeless tobacco: Not on file  . Alcohol Use: Yes     Comment: was drinking 6 beers or shots or more a day - now drinks no wine and drinks several beers/burboun most days  . Drug Use: No  . Sexually Active: Not on file   Other Topics Concern  . Not on file   Social History Narrative   Unemployed.  Previously a Automotive engineer  Physical Exam: Filed Vitals:   07/23/12 1557  BP: 125/88  Pulse: 103  Height: 5\' 7"  (1.702 m)  Weight: 170 lb (77.111 kg)    GEN- The patient is well appearing, alert and oriented x 3 today.   Head- normocephalic, atraumatic Eyes-  Sclera clear, conjunctiva pink Ears- hearing intact Oropharynx- clear Neck- supple, no JVP Lymph- no cervical lymphadenopathy Lungs- Clear to ausculation bilaterally, normal work of breathing Heart- Regular rate and rhythm, no murmurs, rubs or gallops, PMI not laterally displaced GI- soft, NT, ND, + BS Extremities- no clubbing, cyanosis, or edema MS- no significant deformity or atrophy  ekg today reveals sinus rhythm 100 bpm, nonspecific ST/T changes  Assessment and Plan:  1. VT Well controlled, without symptomatic recurrence No changes  2. ETOH  Cessation advised  Return in 12 months

## 2012-07-23 NOTE — Patient Instructions (Signed)
Your physician wants you to follow-up in: 12 months with Dr Allred You will receive a reminder letter in the mail two months in advance. If you don't receive a letter, please call our office to schedule the follow-up appointment.  

## 2012-07-30 ENCOUNTER — Other Ambulatory Visit: Payer: Self-pay | Admitting: Nurse Practitioner

## 2012-08-01 MED ORDER — LISINOPRIL 20 MG PO TABS: 20.0000 mg | ORAL_TABLET | Freq: Every day | ORAL | Status: DC

## 2012-08-01 NOTE — Telephone Encounter (Signed)
Please call in ativen rx 0 refills- NTBS for future refills

## 2012-08-01 NOTE — Telephone Encounter (Signed)
Last seen 10/13, last filled 06/05/12, if approved [facility] into [facility]

## 2012-08-01 NOTE — Telephone Encounter (Signed)
RX called to [facility] vm.

## 2012-08-05 ENCOUNTER — Other Ambulatory Visit: Payer: Self-pay | Admitting: Nurse Practitioner

## 2012-08-07 NOTE — Telephone Encounter (Signed)
See no Vitamin D level since before 2012

## 2012-08-22 ENCOUNTER — Other Ambulatory Visit: Payer: Self-pay | Admitting: Internal Medicine

## 2012-09-01 ENCOUNTER — Other Ambulatory Visit: Payer: Self-pay | Admitting: *Deleted

## 2012-09-01 ENCOUNTER — Other Ambulatory Visit: Payer: Self-pay | Admitting: Nurse Practitioner

## 2012-09-01 MED ORDER — FENOFIBRATE MICRONIZED 130 MG PO CAPS: 130.0000 mg | ORAL_CAPSULE | Freq: Every day | ORAL | Status: DC

## 2012-09-01 NOTE — Telephone Encounter (Signed)
LAST LABS 2/14.

## 2012-09-03 NOTE — Telephone Encounter (Signed)
Please call in ativan rx with 2 refills

## 2012-09-03 NOTE — Telephone Encounter (Signed)
Last seen 12/05/11  MMM Last filled 07/30/12    If approved route to nurse to call in and notify patient

## 2012-09-04 NOTE — Telephone Encounter (Signed)
rx called into pharmacy

## 2012-09-30 ENCOUNTER — Other Ambulatory Visit: Payer: Self-pay | Admitting: Nurse Practitioner

## 2012-10-02 NOTE — Telephone Encounter (Signed)
Pt has been told for the last 2 refills that he needs to be seen

## 2012-10-02 NOTE — Telephone Encounter (Signed)
Must make appointment to be seen.

## 2012-10-03 ENCOUNTER — Telehealth: Payer: Self-pay | Admitting: Nurse Practitioner

## 2012-10-03 NOTE — Telephone Encounter (Signed)
Patient aware.

## 2012-11-05 ENCOUNTER — Other Ambulatory Visit (INDEPENDENT_AMBULATORY_CARE_PROVIDER_SITE_OTHER): Payer: 59

## 2012-11-05 DIAGNOSIS — E785 Hyperlipidemia, unspecified: Secondary | ICD-10-CM

## 2012-11-05 DIAGNOSIS — R7989 Other specified abnormal findings of blood chemistry: Secondary | ICD-10-CM

## 2012-11-06 ENCOUNTER — Other Ambulatory Visit: Payer: Self-pay

## 2012-11-06 MED ORDER — LISINOPRIL 20 MG PO TABS: 20.0000 mg | ORAL_TABLET | Freq: Every day | ORAL | Status: DC

## 2012-11-06 NOTE — Telephone Encounter (Signed)
Last seen 12/05/11  MMM

## 2012-11-07 LAB — CMP14+EGFR
ALT: 12 IU/L (ref 0–44)
AST: 30 IU/L (ref 0–40)
Albumin/Globulin Ratio: 2.1 (ref 1.1–2.5)
GFR calc Af Amer: 99 mL/min/{1.73_m2} (ref >59)
GFR calc non Af Amer: 86 mL/min/{1.73_m2} (ref >59)
Globulin, Total: 2.3 g/dL (ref 1.5–4.5)
Potassium: 4 mmol/L (ref 3.5–5.2)
Sodium: 143 mmol/L (ref 134–144)
Total Bilirubin: 0.4 mg/dL (ref 0.0–1.2)
Total Protein: 7.2 g/dL (ref 6.0–8.5)

## 2012-11-07 LAB — NMR, LIPOPROFILE
LDL Size: 19.7 nm — ABNORMAL LOW (ref >20.5)
LP-IR Score: 88 — ABNORMAL HIGH (ref <=45)
Small LDL Particle Number: 1135 nmol/L — ABNORMAL HIGH (ref <=527)

## 2012-11-10 ENCOUNTER — Ambulatory Visit (INDEPENDENT_AMBULATORY_CARE_PROVIDER_SITE_OTHER): Payer: 59

## 2012-11-10 DIAGNOSIS — Z23 Encounter for immunization: Secondary | ICD-10-CM

## 2012-11-14 ENCOUNTER — Telehealth: Payer: Self-pay | Admitting: Nurse Practitioner

## 2012-11-17 NOTE — Telephone Encounter (Signed)
Patient aware on vm

## 2012-11-17 NOTE — Telephone Encounter (Signed)
Will check vitamin d at next visit

## 2012-11-17 NOTE — Telephone Encounter (Signed)
PT NOTIFIED ABOUT LABS. WANTED TO KNOW IF HE NEEDED A VIT D. NOT DONE AT VISIT.

## 2012-11-27 ENCOUNTER — Other Ambulatory Visit: Payer: Self-pay

## 2012-11-27 MED ORDER — FENOFIBRATE MICRONIZED 130 MG PO CAPS: 130.0000 mg | ORAL_CAPSULE | Freq: Every day | ORAL | Status: DC

## 2012-11-27 NOTE — Telephone Encounter (Signed)
Last lipid 2/14  MMM 

## 2012-12-05 ENCOUNTER — Encounter: Payer: Self-pay | Admitting: Nurse Practitioner

## 2012-12-05 ENCOUNTER — Encounter (INDEPENDENT_AMBULATORY_CARE_PROVIDER_SITE_OTHER): Payer: Self-pay

## 2012-12-05 ENCOUNTER — Ambulatory Visit (INDEPENDENT_AMBULATORY_CARE_PROVIDER_SITE_OTHER): Payer: 59 | Admitting: Nurse Practitioner

## 2012-12-05 VITALS — BP 86/60 | HR 100 | Temp 98.3°F | Ht 67.0 in | Wt 171.0 lb

## 2012-12-05 DIAGNOSIS — I10 Essential (primary) hypertension: Secondary | ICD-10-CM

## 2012-12-05 DIAGNOSIS — E039 Hypothyroidism, unspecified: Secondary | ICD-10-CM

## 2012-12-05 DIAGNOSIS — J069 Acute upper respiratory infection, unspecified: Secondary | ICD-10-CM

## 2012-12-05 DIAGNOSIS — E785 Hyperlipidemia, unspecified: Secondary | ICD-10-CM

## 2012-12-05 MED ORDER — AZITHROMYCIN 250 MG PO TABS: ORAL_TABLET | ORAL | Status: DC

## 2012-12-05 NOTE — Progress Notes (Signed)
Subjective:    Patient ID: Christian King, male    DOB: 1970/07/31, 42 y.o.   MRN: [id number]  URI  This is a new problem. The current episode started in the past 7 days. The problem has been unchanged. There has been no fever. Associated symptoms include coughing, rhinorrhea, sneezing and a sore throat. Pertinent negatives include no ear pain. He has tried nothing for the symptoms. The treatment provided mild relief.  Hypertension This is a chronic problem. The current episode started today. The problem has been resolved since onset. The problem is controlled. Pertinent negatives include no blurred vision, palpitations, peripheral edema or shortness of breath. Risk factors for coronary artery disease include dyslipidemia and male gender. Past treatments include beta blockers and ACE inhibitors. The current treatment provides significant improvement. There is no history of a thyroid problem. There is no history of sleep apnea.  Hyperlipidemia This is a chronic problem. The current episode started more than 1 year ago. The problem is controlled. Recent lipid tests were reviewed and are normal. He has no history of diabetes or hypothyroidism. Pertinent negatives include no leg pain, myalgias or shortness of breath. Current antihyperlipidemic treatment includes statins and fibric acid derivatives. The current treatment provides moderate improvement of lipids. Risk factors for coronary artery disease include dyslipidemia, hypertension and male sex.   Insomnia Pt takes 1/2 ativan about 3-4 nights a weeks- Works well with no complaints    Review of Systems  HENT: Positive for rhinorrhea, sneezing and sore throat. Negative for ear pain.   Eyes: Negative for blurred vision.  Respiratory: Positive for cough. Negative for shortness of breath.   Cardiovascular: Negative for palpitations.  Musculoskeletal: Negative for myalgias.  All other systems reviewed and are negative.       Objective:   Physical Exam  Vitals reviewed. Constitutional: He is oriented to person, place, and time. He appears well-developed and well-nourished.  HENT:  Head: Normocephalic.  Right Ear: External ear normal.  Left Ear: External ear normal.  Eyes: Pupils are equal, round, and reactive to light.  Neck: Normal range of motion. Neck supple.  Cardiovascular: Normal rate, regular rhythm, normal heart sounds and intact distal pulses.   Pulmonary/Chest: Effort normal and breath sounds normal.  Dry nonproductive cough   Abdominal: Soft. Bowel sounds are normal.  Musculoskeletal: Normal range of motion.  Neurological: He is alert and oriented to person, place, and time.  Skin: Skin is warm and dry.  Psychiatric: He has a normal mood and affect. His behavior is normal. Judgment and thought content normal.     BP 86/60  Pulse 100  Temp(Src) 98.3 F (36.8 C) (Oral)  Ht 5\' 7"  (1.702 m)  Wt 171 lb (77.565 kg)  BMI 26.78 kg/m2      Assessment & Plan:   1. HYPOTHYROIDISM   2. HYPERTENSION   3. HYPERLIPIDEMIA   4. Upper respiratory infection, acute      Meds ordered this encounter  Medications  . azithromycin (ZITHROMAX) 250 MG tablet    Sig: As directed    Dispense:  6 each    Refill:  0    Order Specific Question:  Supervising Provider    Answer:  Ernestina Penna [[id number]]  1. Take meds as prescribed 2. Use a cool mist humidifier especially during the winter months and when heat has  been humid. 3. Use saline nose sprays frequently 4. Saline irrigations of the nose can be very helpful if done frequently.  * 4X  daily for 1 week*  * Use of a nettie pot can be helpful with this. Follow directions with this* 5. Drink plenty of fluids 6. Keep thermostat turn down low 7.For any cough or congestion  Use plain Mucinex- regular strength or max strength is fine   * Children- consult with Pharmacist for dosing 8. For fever or aces or pains- take tylenol or ibuprofen appropriate for age and  weight.  * for fevers greater than 101 orally you may alternate ibuprofen and tylenol every  3 hours.     Continue all meds Labs pending Diet and exercise encouraged Health maintenance reviewed Follow up in 3 months  Mary-Margaret Daphine Deutscher, FNP

## 2012-12-05 NOTE — Patient Instructions (Signed)
Health Maintenance, Males A healthy lifestyle and preventative care can promote health and wellness.  Maintain regular health, dental, and eye exams.  Eat a healthy diet. Foods like vegetables, fruits, whole grains, low-fat dairy products, and lean protein foods contain the nutrients you need without too many calories. Decrease your intake of foods high in solid fats, added sugars, and salt. Get information about a proper diet from your caregiver, if necessary.  Regular physical exercise is one of the most important things you can do for your health. Most adults should get at least 150 minutes of moderate-intensity exercise (any activity that increases your heart rate and causes you to sweat) each week. In addition, most adults need muscle-strengthening exercises on 2 or more days a week.   Maintain a healthy weight. The body mass index (BMI) is a screening tool to identify possible weight problems. It provides an estimate of body fat based on height and weight. Your caregiver can help determine your BMI, and can help you achieve or maintain a healthy weight. For adults 20 years and older:  A BMI below 18.5 is considered underweight.  A BMI of 18.5 to 24.9 is normal.  A BMI of 25 to 29.9 is considered overweight.  A BMI of 30 and above is considered obese.  Maintain normal blood lipids and cholesterol by exercising and minimizing your intake of saturated fat. Eat a balanced diet with plenty of fruits and vegetables. Blood tests for lipids and cholesterol should begin at age 20 and be repeated every 5 years. If your lipid or cholesterol levels are high, you are over 50, or you are a high risk for heart disease, you may need your cholesterol levels checked more frequently.Ongoing high lipid and cholesterol levels should be treated with medicines, if diet and exercise are not effective.  If you smoke, find out from your caregiver how to quit. If you do not use tobacco, do not start.  If you  choose to drink alcohol, do not exceed 2 drinks per day. One drink is considered to be 12 ounces (355 mL) of beer, 5 ounces (148 mL) of wine, or 1.5 ounces (44 mL) of liquor.  Avoid use of street drugs. Do not share needles with anyone. Ask for help if you need support or instructions about stopping the use of drugs.  High blood pressure causes heart disease and increases the risk of stroke. Blood pressure should be checked at least every 1 to 2 years. Ongoing high blood pressure should be treated with medicines if weight loss and exercise are not effective.  If you are 45 to 44 years old, ask your caregiver if you should take aspirin to prevent heart disease.  Diabetes screening involves taking a blood sample to check your fasting blood sugar level. This should be done once every 3 years, after age 45, if you are within normal weight and without risk factors for diabetes. Testing should be considered at a younger age or be carried out more frequently if you are overweight and have at least 1 risk factor for diabetes.  Colorectal cancer can be detected and often prevented. Most routine colorectal cancer screening begins at the age of 50 and continues through age 75. However, your caregiver may recommend screening at an earlier age if you have risk factors for colon cancer. On a yearly basis, your caregiver may provide home test kits to check for hidden blood in the stool. Use of a small camera at the end of a tube,   to directly examine the colon (sigmoidoscopy or colonoscopy), can detect the earliest forms of colorectal cancer. Talk to your caregiver about this at age 50, when routine screening begins. Direct examination of the colon should be repeated every 5 to 10 years through age 75, unless early forms of pre-cancerous polyps or small growths are found.  Hepatitis C blood testing is recommended for all people born from 1945 through 1965 and any individual with known risks for hepatitis C.  Healthy  men should no longer receive prostate-specific antigen (PSA) blood tests as part of routine cancer screening. Consult with your caregiver about prostate cancer screening.  Testicular cancer screening is not recommended for adolescents or adult males who have no symptoms. Screening includes self-exam, caregiver exam, and other screening tests. Consult with your caregiver about any symptoms you have or any concerns you have about testicular cancer.  Practice safe sex. Use condoms and avoid high-risk sexual practices to reduce the spread of sexually transmitted infections (STIs).  Use sunscreen with a sun protection factor (SPF) of 30 or greater. Apply sunscreen liberally and repeatedly throughout the day. You should seek shade when your shadow is shorter than you. Protect yourself by wearing long sleeves, pants, a wide-brimmed hat, and sunglasses year round, whenever you are outdoors.  Notify your caregiver of new moles or changes in moles, especially if there is a change in shape or color. Also notify your caregiver if a mole is larger than the size of a pencil eraser.  A one-time screening for abdominal aortic aneurysm (AAA) and surgical repair of large AAAs by sound wave imaging (ultrasonography) is recommended for ages 65 to 75 years who are current or former smokers.  Stay current with your immunizations. Document Released: 08/04/2007 Document Revised: 04/30/2011 Document Reviewed: 07/03/2010 Exit[employer] Patient Information 2014 [employer].  

## 2012-12-09 ENCOUNTER — Other Ambulatory Visit: Payer: Self-pay

## 2012-12-09 MED ORDER — LISINOPRIL 20 MG PO TABS: 20.0000 mg | ORAL_TABLET | Freq: Every day | ORAL | Status: DC

## 2012-12-17 ENCOUNTER — Other Ambulatory Visit: Payer: Self-pay | Admitting: Nurse Practitioner

## 2012-12-18 NOTE — Telephone Encounter (Signed)
Please call in ativan RX

## 2012-12-18 NOTE — Telephone Encounter (Signed)
Last seen 12/05/12  MMM  If approved route to nurse to call into [facility]

## 2012-12-22 NOTE — Telephone Encounter (Signed)
RX called to [facility].

## 2013-01-19 ENCOUNTER — Other Ambulatory Visit: Payer: Self-pay | Admitting: Nurse Practitioner

## 2013-01-21 ENCOUNTER — Other Ambulatory Visit: Payer: Self-pay | Admitting: *Deleted

## 2013-01-21 MED ORDER — FENOFIBRATE MICRONIZED 130 MG PO CAPS: 130.0000 mg | ORAL_CAPSULE | Freq: Every day | ORAL | Status: DC

## 2013-01-21 NOTE — Telephone Encounter (Signed)
Please call in ativan rx 

## 2013-01-21 NOTE — Telephone Encounter (Signed)
Ativan called into [facility].

## 2013-01-21 NOTE — Telephone Encounter (Signed)
Last seen 12/05/12, last filled 12/17/12, uses [facility]

## 2013-02-16 ENCOUNTER — Encounter: Payer: Self-pay | Admitting: Family Medicine

## 2013-02-16 ENCOUNTER — Telehealth: Payer: Self-pay | Admitting: Nurse Practitioner

## 2013-02-16 ENCOUNTER — Ambulatory Visit (INDEPENDENT_AMBULATORY_CARE_PROVIDER_SITE_OTHER): Payer: 59 | Admitting: Family Medicine

## 2013-02-16 VITALS — BP 101/68 | HR 99 | Temp 98.1°F | Ht 67.0 in | Wt 177.0 lb

## 2013-02-16 DIAGNOSIS — J209 Acute bronchitis, unspecified: Secondary | ICD-10-CM

## 2013-02-16 MED ORDER — METHYLPREDNISOLONE (PAK) 4 MG PO TABS: ORAL_TABLET | ORAL | Status: DC

## 2013-02-16 MED ORDER — HYDROCODONE-HOMATROPINE 5-1.5 MG/5ML PO SYRP: 5.0000 mL | ORAL_SOLUTION | Freq: Four times a day (QID) | ORAL | Status: DC | PRN

## 2013-02-16 MED ORDER — AMOXICILLIN 875 MG PO TABS: 875.0000 mg | ORAL_TABLET | Freq: Two times a day (BID) | ORAL | Status: DC

## 2013-02-16 NOTE — Progress Notes (Signed)
   Subjective:    Patient ID: HARVIS MABUS, male    DOB: 1970/05/27, 42 y.o.   MRN: [id number]  HPI  This 42 y.o. male presents for evaluation of uri sx's and cough.  He has  Been short winded and having coughing and congested in chest at night.  Review of Systems    No chest pain, SOB, HA, dizziness, vision change, N/V, diarrhea, constipation, dysuria, urinary urgency or frequency, myalgias, arthralgias or rash.  Objective:   Physical Exam  Vital signs noted  Well developed well nourished male.  HEENT - Head atraumatic Normocephalic                Eyes - PERRLA, Conjuctiva - clear Sclera- Clear EOMI                Ears - EAC's Wnl TM's Wnl Gross Hearing WNL                Nose - Nares patent                 Throat - oropharanx wnl Respiratory - Lungs CTA bilateral Cardiac - RRR S1 and S2 without murmur GI - Abdomen soft Nontender and bowel sounds active x 4 Extremities - No edema. Neuro - Grossly intact.      Assessment & Plan:  Acute bronchitis - Plan: amoxicillin (AMOXIL) 875 MG tablet, methylPREDNIsolone (MEDROL DOSPACK) 4 MG tablet, HYDROcodone-homatropine (HYCODAN) 5-1.5 MG/5ML syrup.  Push po fluids, rest, tylenol and motrin otc prn as directed for fever, arthralgias, and myalgias.  Follow up prn if sx's continue or persist.  Deatra Canter FNP

## 2013-02-16 NOTE — Telephone Encounter (Signed)
appt today at 4 

## 2013-02-18 ENCOUNTER — Other Ambulatory Visit: Payer: Self-pay | Admitting: Nurse Practitioner

## 2013-02-20 ENCOUNTER — Telehealth: Payer: Self-pay | Admitting: Nurse Practitioner

## 2013-02-23 NOTE — Telephone Encounter (Signed)
rx called into pharmacy

## 2013-02-23 NOTE — Telephone Encounter (Signed)
Please call in ativan rx 

## 2013-03-17 ENCOUNTER — Telehealth: Payer: Self-pay | Admitting: Nurse Practitioner

## 2013-03-17 MED ORDER — ANTARA 90 MG PO CAPS: 90.0000 mg | ORAL_CAPSULE | Freq: Every day | ORAL | Status: DC

## 2013-03-17 NOTE — Telephone Encounter (Signed)
Wrote for lower dose of antara which has immediate coupon- should cost anything

## 2013-03-17 NOTE — Telephone Encounter (Signed)
Insurance will not cover Fenofibrate. What else can he take?

## 2013-03-18 NOTE — Telephone Encounter (Signed)
Pt will pickup coupon card.

## 2013-03-18 NOTE — Telephone Encounter (Signed)
Pt.notified

## 2013-03-23 MED ORDER — FENOFIBRATE 54 MG PO TABS: 54.0000 mg | ORAL_TABLET | Freq: Every day | ORAL | Status: DC

## 2013-03-23 NOTE — Telephone Encounter (Signed)
Insurance is still denying antara with coupon card. i spoke with pharmacy and they stated ins will not cover. Please advise

## 2013-03-23 NOTE — Telephone Encounter (Signed)
Pharmacy is asking for fenofibrate 160mg  or 54mg  will this work?

## 2013-03-23 NOTE — Telephone Encounter (Signed)
I spoke with [facility] and they said ins is denying to cover it.

## 2013-03-23 NOTE — Telephone Encounter (Signed)
Already chnaged med to fenofibrate

## 2013-03-23 NOTE — Telephone Encounter (Signed)
[facility] not [facility] very much- should be 0 co pay with coupon if has private insurance- bet if he tries somewhere else should go through.

## 2013-03-26 ENCOUNTER — Other Ambulatory Visit: Payer: Self-pay | Admitting: Nurse Practitioner

## 2013-03-30 ENCOUNTER — Telehealth: Payer: Self-pay | Admitting: Family Medicine

## 2013-03-30 NOTE — Telephone Encounter (Signed)
Last seen 02/16/13  [place]  [facility]

## 2013-03-30 NOTE — Telephone Encounter (Signed)
Called [facility]epam [facility] [place]

## 2013-04-13 ENCOUNTER — Other Ambulatory Visit: Payer: Self-pay | Admitting: Nurse Practitioner

## 2013-04-27 ENCOUNTER — Other Ambulatory Visit: Payer: Self-pay | Admitting: Family Medicine

## 2013-04-28 ENCOUNTER — Telehealth: Payer: Self-pay | Admitting: *Deleted

## 2013-04-28 MED ORDER — LORAZEPAM 2 MG PO TABS: ORAL_TABLET | ORAL | Status: DC

## 2013-04-28 NOTE — Telephone Encounter (Signed)
Last filled 03/30/2013. If approved have nurse to call into [facility]

## 2013-04-28 NOTE — Telephone Encounter (Signed)
Please call in ativan with 0 refills 

## 2013-04-28 NOTE — Telephone Encounter (Signed)
Needs a refill for ativan 2mg  1 po @ hs prn #30  Last filled 03/26/2013 [facility] to [facility]

## 2013-04-29 NOTE — Telephone Encounter (Signed)
Left message with authorization

## 2013-05-01 ENCOUNTER — Other Ambulatory Visit: Payer: Self-pay | Admitting: *Deleted

## 2013-05-01 DIAGNOSIS — I472 Ventricular tachycardia, unspecified: Secondary | ICD-10-CM

## 2013-05-01 MED ORDER — METOPROLOL SUCCINATE ER 50 MG PO TB24: 50.0000 mg | ORAL_TABLET | Freq: Every day | ORAL | Status: DC

## 2013-05-01 MED ORDER — POTASSIUM CHLORIDE CRYS ER 10 MEQ PO TBCR: EXTENDED_RELEASE_TABLET | ORAL | Status: DC

## 2013-05-05 ENCOUNTER — Other Ambulatory Visit: Payer: Self-pay

## 2013-05-05 NOTE — Telephone Encounter (Signed)
Last seen 02/16/13 JWO  No Vit D in EPIC  This is mail order

## 2013-05-06 ENCOUNTER — Other Ambulatory Visit: Payer: Self-pay | Admitting: Nurse Practitioner

## 2013-05-06 ENCOUNTER — Other Ambulatory Visit: Payer: Self-pay | Admitting: Family Medicine

## 2013-05-06 MED ORDER — LISINOPRIL 20 MG PO TABS: 20.0000 mg | ORAL_TABLET | Freq: Every day | ORAL | Status: DC

## 2013-05-06 NOTE — Telephone Encounter (Signed)
Lisinopril [facility] ent [facility] [place]- ntbs for other refills

## 2013-05-06 NOTE — Telephone Encounter (Signed)
Please review

## 2013-05-07 ENCOUNTER — Other Ambulatory Visit: Payer: Self-pay | Admitting: *Deleted

## 2013-05-07 ENCOUNTER — Telehealth: Payer: Self-pay | Admitting: Nurse Practitioner

## 2013-05-07 MED ORDER — LISINOPRIL 20 MG PO TABS: 20.0000 mg | ORAL_TABLET | Freq: Every day | ORAL | Status: DC

## 2013-05-07 NOTE — Telephone Encounter (Signed)
Spoke with patient.

## 2013-05-14 ENCOUNTER — Other Ambulatory Visit: Payer: Self-pay | Admitting: *Deleted

## 2013-05-14 NOTE — Telephone Encounter (Signed)
Patient NTBS for follow up and lab work before refilling vitamin D

## 2013-05-15 ENCOUNTER — Telehealth: Payer: Self-pay | Admitting: Nurse Practitioner

## 2013-05-15 NOTE — Telephone Encounter (Signed)
Detailed message left that he needs to be seen

## 2013-05-15 NOTE — Telephone Encounter (Signed)
Patient aware that he has to be seen in order to get a refill on his medication.

## 2013-05-26 ENCOUNTER — Encounter: Payer: Self-pay | Admitting: Nurse Practitioner

## 2013-05-26 ENCOUNTER — Ambulatory Visit (INDEPENDENT_AMBULATORY_CARE_PROVIDER_SITE_OTHER): Payer: 59 | Admitting: Nurse Practitioner

## 2013-05-26 VITALS — BP 96/70 | HR 76 | Temp 97.0°F | Ht 67.0 in | Wt 170.4 lb

## 2013-05-26 DIAGNOSIS — Z77018 Contact with and (suspected) exposure to other hazardous metals: Secondary | ICD-10-CM

## 2013-05-26 DIAGNOSIS — F1010 Alcohol abuse, uncomplicated: Secondary | ICD-10-CM

## 2013-05-26 DIAGNOSIS — F411 Generalized anxiety disorder: Secondary | ICD-10-CM

## 2013-05-26 DIAGNOSIS — E039 Hypothyroidism, unspecified: Secondary | ICD-10-CM

## 2013-05-26 DIAGNOSIS — I10 Essential (primary) hypertension: Secondary | ICD-10-CM

## 2013-05-26 DIAGNOSIS — E785 Hyperlipidemia, unspecified: Secondary | ICD-10-CM

## 2013-05-26 DIAGNOSIS — Z1388 Encounter for screening for disorder due to exposure to contaminants: Secondary | ICD-10-CM

## 2013-05-26 DIAGNOSIS — M109 Gout, unspecified: Secondary | ICD-10-CM

## 2013-05-26 MED ORDER — LORAZEPAM 2 MG PO TABS: ORAL_TABLET | ORAL | Status: DC

## 2013-05-26 NOTE — Progress Notes (Signed)
Subjective:    Patient ID: BREVAN LUBERTO, male    DOB: 05-18-1970, 43 y.o.   MRN: [id number]  Patient here today for follow up  Of chronic medical problems.  Hypertension This is a chronic problem. The current episode started today. The problem has been resolved since onset. The problem is controlled. Pertinent negatives include no blurred vision, palpitations, peripheral edema or shortness of breath. Risk factors for coronary artery disease include dyslipidemia and male gender. Past treatments include beta blockers and ACE inhibitors. The current treatment provides significant improvement. There is no history of a thyroid problem. There is no history of sleep apnea.  Hyperlipidemia This is a chronic problem. The current episode started more than 1 year ago. The problem is controlled. Recent lipid tests were reviewed and are normal. He has no history of diabetes or hypothyroidism. Pertinent negatives include no leg pain, myalgias or shortness of breath. Current antihyperlipidemic treatment includes statins and fibric acid derivatives. The current treatment provides moderate improvement of lipids. Risk factors for coronary artery disease include dyslipidemia, hypertension and male sex.  Insomnia Pt takes 1/2 ativan about 3-4 nights a weeks- Works well with no complaints  Alcohol abuse Patient says that he drinks daily- sometimes beer sometimes liquor. Has no desire to stop.   Review of Systems  Eyes: Negative for blurred vision.  Respiratory: Negative for shortness of breath.   Cardiovascular: Negative for palpitations.  Musculoskeletal: Negative for myalgias.  All other systems reviewed and are negative.       Objective:   Physical Exam  Vitals reviewed. Constitutional: He is oriented to person, place, and time. He appears well-developed and well-nourished.  HENT:  Head: Normocephalic.  Right Ear: External ear normal.  Left Ear: External ear normal.  Eyes: Pupils are equal,  round, and reactive to light.  Neck: Normal range of motion. Neck supple.  Cardiovascular: Normal rate, regular rhythm, normal heart sounds and intact distal pulses.   Pulmonary/Chest: Effort normal and breath sounds normal.  Dry nonproductive cough   Abdominal: Soft. Bowel sounds are normal.  Musculoskeletal: Normal range of motion.  Neurological: He is alert and oriented to person, place, and time.  Skin: Skin is warm and dry.  Psychiatric: He has a normal mood and affect. His behavior is normal. Judgment and thought content normal.     BP 96/70  Pulse 76  Temp(Src) 97 F (36.1 C) (Oral)  Ht 5' 7" (1.702 m)  Wt 170 lb 6.4 oz (77.293 kg)  BMI 26.68 kg/m2      Assessment & Plan:   1. GAD (generalized anxiety disorder)   2. HYPOTHYROIDISM   3. HYPERTENSION   4. HYPERLIPIDEMIA   5. GOUT   6. Alcohol abuse   7. Heavy metal exposure   8. Screening for heavy metal poisoning    Orders Placed This Encounter  Procedures  . CMP14+EGFR  . NMR, lipoprofile  . Chromium and Cobalt, WB (MoM)  . Anemia Profile B  . Thyroid Panel With TSH   Meds ordered this encounter  Medications  . LORazepam (ATIVAN) 2 MG tablet    Sig: TAKE ONE TABLET BY MOUTH ONCE DAILY AT BEDTIME    Dispense:  30 tablet    Refill:  1    Order Specific Question:  Supervising Provider    Answer:  Joycelyn Man  Do Not mix alcohol and ativan Discussed alcohol consumption and its affect on life and body- no desire to quit Labs pending Health maintenance reviewed  Diet and exercise encouraged Continue all meds Follow up  In 3 months   [place], FNP

## 2013-05-26 NOTE — Patient Instructions (Signed)
Alcohol Use Disorder  Alcohol use disorder is a mental disorder. It is not a one-time incident of heavy drinking. Alcohol use disorder is the excessive and uncontrollable use of alcohol over time that leads to problems with functioning in one or more areas of daily living. People with this disorder risk harming themselves and others when they drink to excess. Alcohol use disorder also can cause other mental disorders, such as mood and anxiety disorders, and serious physical problems. People with alcohol use disorder often misuse other drugs.   Alcohol use disorder is common and widespread. Some people with this disorder drink alcohol to cope with or escape from negative life events. Others drink to relieve chronic pain or symptoms of mental illness. People with a family history of alcohol use disorder are at higher risk of losing control and using alcohol to excess.   SYMPTOMS   Signs and symptoms of alcohol use disorder may include the following:   · Consumption of alcohol in larger amounts or over a longer period of time than intended.  · Multiple unsuccessful attempts to cut down or control alcohol use.    · A great deal of time spent obtaining alcohol, using alcohol, or recovering from the effects of alcohol (hangover).  · A strong desire or urge to use alcohol (cravings).    · Continued use of alcohol despite problems at work, school, or home because of alcohol use.    · Continued use of alcohol despite problems in relationships because of alcohol use.  · Continued use of alcohol in situations when it is physically hazardous, such as driving a car.  · Continued use of alcohol despite awareness of a physical or psychological problem that is likely related to alcohol use. Physical problems related to alcohol use can involve the brain, heart, liver, stomach, and intestines. Psychological problems related to alcohol use include intoxication, depression, anxiety, psychosis, delirium, and dementia.    · The need for  increased amounts of alcohol to achieve the same desired effect, or a decreased effect from the consumption of the same amount of alcohol (tolerance).  · Withdrawal symptoms upon reducing or stopping alcohol use, or alcohol use to reduce or avoid withdrawal symptoms. Withdrawal symptoms include:  · Racing heart.  · Hand tremor.  · Difficulty sleeping.  · Nausea.  · Vomiting.  · Hallucinations.  · Restlessness.  · Seizures.  DIAGNOSIS  Alcohol use disorder is diagnosed through an assessment by your caregiver. Your caregiver may start by asking three or four questions to screen for excessive or problematic alcohol use. To confirm a diagnosis of alcohol use disorder, at least two symptoms (see SYMPTOMS) must be present within a 12-month period. The severity of alcohol use disorder depends on the number of symptoms:  · Mild two or three.  · Moderate four or five.  · Severe six or more.  Your caregiver may perform a physical exam or use results from lab tests to see if you have physical problems resulting from alcohol use. Your caregiver may refer you to a mental health professional for evaluation.  TREATMENT   Some people with alcohol use disorder are able to reduce their alcohol use to low-risk levels. Some people with alcohol use disorder need to quit drinking alcohol. When necessary, mental health professionals with specialized training in substance use treatment can help. Your caregiver can help you decide how severe your alcohol use disorder is and what type of treatment you need. The following forms of treatment are available:   · Detoxification. Detoxification involves   the use of prescription medication to prevent alcohol withdrawal symptoms in the first week after quitting. This is important for people with a history of symptoms of withdrawal and for heavy drinkers who are likely to have withdrawal symptoms. Alcohol withdrawal can be dangerous and, in severe cases, cause death. Detoxification is usually provided  in a hospital or in-patient substance use treatment facility.  · Counseling or talk therapy. Talk therapy is provided by substance use treatment counselors. It addresses the reasons people use alcohol and ways to keep them from drinking again. The goals of talk therapy are to help people with alcohol use disorder find healthy activities and ways to cope with life stress, to identify and avoid triggers for alcohol use, and to handle cravings, which can cause relapse.  · Medication. Different medications can help treat alcohol use disorder through the following actions:  · Decrease alcohol cravings.  · Decrease the positive reward response felt from alcohol use.  · Produce an uncomfortable physical reaction when alcohol is used (aversion therapy).  · Support groups. Support groups are run by people who have quit drinking. They provide emotional support, advice, and guidance.  These forms of treatment are often combined. Some people with alcohol use disorder benefit from intensive combination treatment provided by specialized substance use treatment centers. Both inpatient and outpatient treatment programs are available.  Document Released: 03/15/2004 Document Revised: 10/08/2012 Document Reviewed: 05/15/2012  Exit[employer]® Patient Information ©2014 [employer].

## 2013-05-29 LAB — CMP14+EGFR
ALT: 28 IU/L (ref 0–44)
AST: 59 IU/L — AB (ref 0–40)
Albumin/Globulin Ratio: 1.8 (ref 1.1–2.5)
Albumin: 4.9 g/dL (ref 3.5–5.5)
Alkaline Phosphatase: 45 IU/L (ref 39–117)
BUN/Creatinine Ratio: 9 (ref 9–20)
BUN: 13 mg/dL (ref 6–24)
CALCIUM: 9.4 mg/dL (ref 8.7–10.2)
CHLORIDE: 99 mmol/L (ref 97–108)
CO2: 24 mmol/L (ref 18–29)
Creatinine, Ser: 1.44 mg/dL — ABNORMAL HIGH (ref 0.76–1.27)
GFR calc Af Amer: 67 mL/min/{1.73_m2} (ref >59)
GFR calc non Af Amer: 58 mL/min/{1.73_m2} — ABNORMAL LOW (ref >59)
GLUCOSE: 77 mg/dL (ref 65–99)
Globulin, Total: 2.7 g/dL (ref 1.5–4.5)
POTASSIUM: 4.7 mmol/L (ref 3.5–5.2)
Sodium: 140 mmol/L (ref 134–144)
TOTAL PROTEIN: 7.6 g/dL (ref 6.0–8.5)
Total Bilirubin: 0.7 mg/dL (ref 0.0–1.2)

## 2013-05-29 LAB — THYROID PANEL WITH TSH
Free Thyroxine Index: 2.3 (ref 1.2–4.9)
T3 UPTAKE RATIO: 27 % (ref 24–39)
T4, Total: 8.7 ug/dL (ref 4.5–12.0)
TSH: 4.21 u[IU]/mL (ref 0.450–4.500)

## 2013-05-29 LAB — ANEMIA PROFILE B
Basophils Absolute: 0.1 10*3/uL (ref 0.0–0.2)
Basos: 2 %
EOS: 2 %
Eosinophils Absolute: 0.1 10*3/uL (ref 0.0–0.4)
Ferritin: 591 ng/mL — ABNORMAL HIGH (ref 30–400)
Folate: 3 ng/mL — ABNORMAL LOW (ref >3.0)
HEMATOCRIT: 36.4 % — AB (ref 37.5–51.0)
Hemoglobin: 12.1 g/dL — ABNORMAL LOW (ref 12.6–17.7)
IMMATURE GRANULOCYTES: 0 %
IRON SATURATION: 34 % (ref 15–55)
Immature Grans (Abs): 0 10*3/uL (ref 0.0–0.1)
Iron: 156 ug/dL — ABNORMAL HIGH (ref 40–155)
Lymphocytes Absolute: 0.9 10*3/uL (ref 0.7–3.1)
Lymphs: 31 %
MCH: 36.4 pg — AB (ref 26.6–33.0)
MCHC: 33.2 g/dL (ref 31.5–35.7)
MCV: 110 fL — ABNORMAL HIGH (ref 79–97)
MONOS ABS: 0.4 10*3/uL (ref 0.1–0.9)
Monocytes: 15 %
NEUTROS ABS: 1.5 10*3/uL (ref 1.4–7.0)
Neutrophils Relative %: 50 %
PLATELETS: 100 10*3/uL — AB (ref 150–379)
RBC: 3.32 x10E6/uL — ABNORMAL LOW (ref 4.14–5.80)
RDW: 17.1 % — AB (ref 12.3–15.4)
Retic Ct Pct: 1 % (ref 0.6–2.6)
TIBC: 455 ug/dL — AB (ref 250–450)
UIBC: 299 ug/dL (ref 150–375)
Vitamin B-12: 211 pg/mL (ref 211–946)
WBC: 2.9 10*3/uL — ABNORMAL LOW (ref 3.4–10.8)

## 2013-05-29 LAB — NMR, LIPOPROFILE
Cholesterol: 205 mg/dL — ABNORMAL HIGH (ref <200)
HDL CHOLESTEROL BY NMR: 96 mg/dL (ref >=40)
HDL Particle Number: 45 umol/L (ref >=30.5)
LDL Particle Number: 755 nmol/L (ref <1000)
LDL Size: 21.3 nm (ref >20.5)
LDLC SERPL CALC-MCNC: 95 mg/dL (ref <100)
LP-IR Score: <25 (ref <=45)
Small LDL Particle Number: <90 nmol/L (ref <=527)
TRIGLYCERIDES BY NMR: 72 mg/dL (ref <150)

## 2013-05-29 LAB — CHROMIUM AND COBALT, WB (MOM)

## 2013-06-11 ENCOUNTER — Telehealth: Payer: Self-pay | Admitting: Nurse Practitioner

## 2013-06-11 DIAGNOSIS — Z77018 Contact with and (suspected) exposure to other hazardous metals: Secondary | ICD-10-CM

## 2013-06-11 DIAGNOSIS — D696 Thrombocytopenia, unspecified: Secondary | ICD-10-CM

## 2013-06-11 NOTE — Telephone Encounter (Signed)
Discussed with patient. He will return for repeat labs soon.

## 2013-07-14 ENCOUNTER — Telehealth: Payer: Self-pay | Admitting: Nurse Practitioner

## 2013-07-14 NOTE — Telephone Encounter (Signed)
Janie stone spoke with pt appt made

## 2013-07-16 ENCOUNTER — Other Ambulatory Visit: Payer: Self-pay | Admitting: Internal Medicine

## 2013-07-20 ENCOUNTER — Other Ambulatory Visit: Payer: Self-pay

## 2013-07-20 ENCOUNTER — Ambulatory Visit (INDEPENDENT_AMBULATORY_CARE_PROVIDER_SITE_OTHER): Payer: 59 | Admitting: Nurse Practitioner

## 2013-07-20 ENCOUNTER — Encounter: Payer: Self-pay | Admitting: Nurse Practitioner

## 2013-07-20 VITALS — BP 126/82 | HR 79 | Temp 98.1°F | Ht 67.0 in | Wt 166.0 lb

## 2013-07-20 DIAGNOSIS — Z09 Encounter for follow-up examination after completed treatment for conditions other than malignant neoplasm: Secondary | ICD-10-CM

## 2013-07-20 DIAGNOSIS — D691 Qualitative platelet defects: Secondary | ICD-10-CM

## 2013-07-20 DIAGNOSIS — I10 Essential (primary) hypertension: Secondary | ICD-10-CM

## 2013-07-20 LAB — POCT CBC
GRANULOCYTE PERCENT: 62.7 % (ref 37–80)
HCT, POC: 36.1 % — AB (ref 43.5–53.7)
Hemoglobin: 11.5 g/dL — AB (ref 14.1–18.1)
Lymph, poc: 1.4 (ref 0.6–3.4)
MCH, POC: 35.3 pg — AB (ref 27–31.2)
MCHC: 31.7 g/dL — AB (ref 31.8–35.4)
MCV: 111.2 fL — AB (ref 80–97)
MPV: 6.8 fL (ref 0–99.8)
POC Granulocyte: 3 (ref 2–6.9)
POC LYMPH PERCENT: 28.9 %L (ref 10–50)
Platelet Count, POC: 421 10*3/uL (ref 142–424)
RBC: 3.3 M/uL — AB (ref 4.69–6.13)
RDW, POC: 15.3 %
WBC: 4.8 10*3/uL (ref 4.6–10.2)

## 2013-07-20 MED ORDER — FENOFIBRATE 54 MG PO TABS: 54.0000 mg | ORAL_TABLET | Freq: Every day | ORAL | Status: DC

## 2013-07-20 MED ORDER — VITAMIN D (ERGOCALCIFEROL) 1.25 MG (50000 UNIT) PO CAPS: ORAL_CAPSULE | ORAL | Status: DC

## 2013-07-20 NOTE — Progress Notes (Signed)
   Subjective:    Patient ID: Christian King, male    DOB: 08/01/1970, 43 y.o.   MRN: [id number]  HPI Patient was at a concert outside and passed out- Was diagnosed with heat exhaustion and dehydration- His blood pressure was low at hospital and nothing has changed. He has been holding his lisinopril and blood pressure has been running normal.    Review of Systems  Constitutional: Negative.   HENT: Negative.   Respiratory: Negative.   Cardiovascular: Negative.   Gastrointestinal: Negative.   Genitourinary: Negative.   Neurological: Negative.   Psychiatric/Behavioral: Negative.   All other systems reviewed and are negative.      Objective:   Physical Exam  Constitutional: He is oriented to person, place, and time. He appears well-developed and well-nourished.  Cardiovascular: Normal rate, regular rhythm and normal heart sounds.   Pulmonary/Chest: Effort normal.  Neurological: He is alert and oriented to person, place, and time.  Skin: Skin is warm and dry.  Psychiatric: He has a normal mood and affect. His behavior is normal. Thought content normal.    BP 126/82  Pulse 79  Temp(Src) 98.1 F (36.7 C) (Oral)  Ht _0  (1.702 m)  Wt 166 lb (75.297 kg)  BMI 25.99 kg/m2       Assessment & Plan:   1. Hospital discharge follow-up   2. HYPERTENSION    Orders Placed This Encounter  Procedures  . CMP14+EGFR  No hospital records to review Continue to hold lisinopril Keep check of blood pressure- if goes above 856 systolic need to start back on 1/2 of lisinopril RTO prn AVOID alcohol  Mary-Margaret Hassell Done, FNP

## 2013-07-20 NOTE — Patient Instructions (Signed)

## 2013-07-20 NOTE — Telephone Encounter (Signed)
Last seen 05/26/13  MMM  Last lipid 05/26/13  No Vit D level in EPIC  This is mail order

## 2013-07-21 LAB — CMP14+EGFR
ALK PHOS: 57 IU/L (ref 39–117)
ALT: 15 IU/L (ref 0–44)
AST: 25 IU/L (ref 0–40)
Albumin/Globulin Ratio: 1.8 (ref 1.1–2.5)
Albumin: 4.5 g/dL (ref 3.5–5.5)
BILIRUBIN TOTAL: 0.3 mg/dL (ref 0.0–1.2)
BUN / CREAT RATIO: 7 — AB (ref 9–20)
BUN: 7 mg/dL (ref 6–24)
CHLORIDE: 102 mmol/L (ref 97–108)
CO2: 22 mmol/L (ref 18–29)
Calcium: 9.7 mg/dL (ref 8.7–10.2)
Creatinine, Ser: 1.07 mg/dL (ref 0.76–1.27)
GFR calc non Af Amer: 83 mL/min/{1.73_m2} (ref >59)
GFR, EST AFRICAN AMERICAN: 96 mL/min/{1.73_m2} (ref >59)
Globulin, Total: 2.5 g/dL (ref 1.5–4.5)
Glucose: 114 mg/dL — ABNORMAL HIGH (ref 65–99)
POTASSIUM: 4.4 mmol/L (ref 3.5–5.2)
SODIUM: 142 mmol/L (ref 134–144)
Total Protein: 7 g/dL (ref 6.0–8.5)

## 2013-07-27 ENCOUNTER — Ambulatory Visit: Payer: 59 | Admitting: Internal Medicine

## 2013-08-17 ENCOUNTER — Other Ambulatory Visit: Payer: Self-pay | Admitting: Nurse Practitioner

## 2013-08-18 NOTE — Telephone Encounter (Signed)
rx called into pharmacy

## 2013-08-18 NOTE — Telephone Encounter (Signed)
Please call in ativan with 1 refills 

## 2013-08-18 NOTE — Telephone Encounter (Signed)
Last seen 07/20/13  MMM  If approved route to nurse to call into [facility]

## 2013-08-24 ENCOUNTER — Other Ambulatory Visit: Payer: Self-pay | Admitting: Internal Medicine

## 2013-08-26 ENCOUNTER — Other Ambulatory Visit: Payer: Self-pay

## 2013-08-26 ENCOUNTER — Ambulatory Visit: Payer: 59 | Admitting: Internal Medicine

## 2013-08-28 ENCOUNTER — Other Ambulatory Visit: Payer: Self-pay | Admitting: Internal Medicine

## 2013-09-02 ENCOUNTER — Other Ambulatory Visit: Payer: Self-pay | Admitting: Nurse Practitioner

## 2013-09-28 ENCOUNTER — Telehealth: Payer: Self-pay | Admitting: Nurse Practitioner

## 2013-09-28 NOTE — Telephone Encounter (Signed)
appt scheduled for tomorrow at 4 with bill oxford

## 2013-09-29 ENCOUNTER — Ambulatory Visit (INDEPENDENT_AMBULATORY_CARE_PROVIDER_SITE_OTHER): Payer: 59 | Admitting: Family Medicine

## 2013-09-29 ENCOUNTER — Encounter: Payer: Self-pay | Admitting: Family Medicine

## 2013-09-29 VITALS — BP 124/88 | HR 79 | Temp 97.7°F | Ht 67.0 in | Wt 167.0 lb

## 2013-09-29 DIAGNOSIS — T148 Other injury of unspecified body region: Secondary | ICD-10-CM

## 2013-09-29 DIAGNOSIS — D518 Other vitamin B12 deficiency anemias: Secondary | ICD-10-CM

## 2013-09-29 DIAGNOSIS — W57XXXA Bitten or stung by nonvenomous insect and other nonvenomous arthropods, initial encounter: Secondary | ICD-10-CM

## 2013-09-29 DIAGNOSIS — E538 Deficiency of other specified B group vitamins: Secondary | ICD-10-CM

## 2013-09-29 DIAGNOSIS — R5383 Other fatigue: Secondary | ICD-10-CM

## 2013-09-29 DIAGNOSIS — R5381 Other malaise: Secondary | ICD-10-CM

## 2013-09-29 MED ORDER — CYANOCOBALAMIN 1000 MCG/ML IJ SOLN
1000.0000 ug | INTRAMUSCULAR | Status: DC
  Administered 2013-09-29: 1000 ug via INTRAMUSCULAR

## 2013-09-29 MED ORDER — DOXYCYCLINE HYCLATE 100 MG PO TABS: 100.0000 mg | ORAL_TABLET | Freq: Two times a day (BID) | ORAL | Status: DC

## 2013-09-29 NOTE — Progress Notes (Signed)
   Subjective:    Patient ID: Christian King, male    DOB: 17-Aug-1970, 43 y.o.   MRN: [id number]  HPI  C/o chest congestion and decreased energy.  He has been bitten by a tick 4 days ago.  He has hx of viamin B12 deficiency and anemia.  He has low energy.  Review of Systems No chest pain, SOB, HA, dizziness, vision change, N/V, diarrhea, constipation, dysuria, urinary urgency or frequency, myalgias, arthralgias or rash.     Objective:   Physical Exam  Vital signs noted  Well developed well nourished male.  HEENT - Head atraumatic Normocephalic                Eyes - PERRLA, Conjuctiva - clear Sclera- Clear EOMI                Ears - EAC's Wnl TM's Wnl Gross Hearing WNL                Throat - oropharanx wnl Respiratory - Lungs CTA bilateral Cardiac - RRR S1 and S2 without murmur GI - Abdomen soft Nontender and bowel sounds active x 4 Extremities - No edema. Neuro - Grossly intact.      Assessment & Plan:  Other malaise and fatigue - Plan: Rocky mtn spotted fvr abs pnl(IgG+IgM), Lyme Ab/Western Blot Reflex, Vit D  25 hydroxy (rtn osteoporosis monitoring), Thyroid Panel With TSH, CMP14+EGFR, Anemia panel, CANCELED: POCT CBC  Tick bite - Plan: Rocky mtn spotted fvr abs pnl(IgG+IgM), Lyme Ab/Western Blot Reflex, doxycycline (VIBRA-TABS) 100 MG tablet, CANCELED: POCT CBC  Anemia - anemia panel  B12 deficiency - Plan: cyanocobalamin ((VITAMIN B-12)) injection 1,000 mcg  Lysbeth Penner FNP

## 2013-09-30 ENCOUNTER — Ambulatory Visit: Payer: 59 | Admitting: Internal Medicine

## 2013-09-30 LAB — SPECIMEN STATUS REPORT

## 2013-10-01 ENCOUNTER — Other Ambulatory Visit: Payer: Self-pay | Admitting: Family Medicine

## 2013-10-01 DIAGNOSIS — D696 Thrombocytopenia, unspecified: Secondary | ICD-10-CM

## 2013-10-01 LAB — CMP14+EGFR
ALT: 65 IU/L — ABNORMAL HIGH (ref 0–44)
AST: 129 IU/L — ABNORMAL HIGH (ref 0–40)
Albumin/Globulin Ratio: 1.4 (ref 1.1–2.5)
Albumin: 4.1 g/dL (ref 3.5–5.5)
Alkaline Phosphatase: 51 IU/L (ref 39–117)
BUN/Creatinine Ratio: 5 — ABNORMAL LOW (ref 9–20)
BUN: 5 mg/dL — ABNORMAL LOW (ref 6–24)
CO2: 24 mmol/L (ref 18–29)
Calcium: 8.9 mg/dL (ref 8.7–10.2)
Chloride: 97 mmol/L (ref 97–108)
Creatinine, Ser: 0.93 mg/dL (ref 0.76–1.27)
GFR calc Af Amer: 114 mL/min/{1.73_m2} (ref >59)
GFR calc non Af Amer: 99 mL/min/{1.73_m2} (ref >59)
Globulin, Total: 2.9 g/dL (ref 1.5–4.5)
Glucose: 169 mg/dL — ABNORMAL HIGH (ref 65–99)
Potassium: 3.6 mmol/L (ref 3.5–5.2)
Sodium: 142 mmol/L (ref 134–144)
Total Bilirubin: 0.9 mg/dL (ref 0.0–1.2)
Total Protein: 7 g/dL (ref 6.0–8.5)

## 2013-10-01 LAB — ROCKY MTN SPOTTED FVR ABS PNL(IGG+IGM)
RMSF IgG: NEGATIVE
RMSF IgM: 0.44 index (ref 0.00–0.89)

## 2013-10-01 LAB — ANEMIA PROFILE B
Basophils Absolute: 0.1 10*3/uL (ref 0.0–0.2)
Basos: 2 %
Eos: 2 %
Eosinophils Absolute: 0.1 10*3/uL (ref 0.0–0.4)
Ferritin: 706 ng/mL — ABNORMAL HIGH (ref 30–400)
Folate: 2.9 ng/mL — ABNORMAL LOW (ref >3.0)
HCT: 40.3 % (ref 37.5–51.0)
Hemoglobin: 13.9 g/dL (ref 12.6–17.7)
Immature Grans (Abs): 0 10*3/uL (ref 0.0–0.1)
Immature Granulocytes: 0 %
Iron Saturation: 83 % (ref 15–55)
Iron: 280 ug/dL (ref 40–155)
Lymphocytes Absolute: 0.8 10*3/uL (ref 0.7–3.1)
Lymphs: 23 %
MCH: 37.3 pg — ABNORMAL HIGH (ref 26.6–33.0)
MCHC: 34.5 g/dL (ref 31.5–35.7)
MCV: 108 fL — ABNORMAL HIGH (ref 79–97)
Monocytes Absolute: 0.4 10*3/uL (ref 0.1–0.9)
Monocytes: 11 %
Neutrophils Absolute: 2.1 10*3/uL (ref 1.4–7.0)
Neutrophils Relative %: 62 %
Platelets: 82 10*3/uL — CL (ref 150–379)
RBC: 3.73 x10E6/uL — ABNORMAL LOW (ref 4.14–5.80)
RDW: 15.5 % — ABNORMAL HIGH (ref 12.3–15.4)
Retic Ct Pct: 1.1 % (ref 0.6–2.6)
TIBC: 336 ug/dL (ref 250–450)
UIBC: 56 ug/dL — ABNORMAL LOW (ref 150–375)
Vitamin B-12: >1999 pg/mL — ABNORMAL HIGH (ref 211–946)
WBC: 3.3 10*3/uL — ABNORMAL LOW (ref 3.4–10.8)

## 2013-10-01 LAB — LYME AB/WESTERN BLOT REFLEX
LYME DISEASE AB, QUANT, IGM: <0.8 index (ref 0.00–0.79)
Lyme IgG/IgM Ab: <0.91 {ISR} (ref 0.00–0.90)

## 2013-10-01 LAB — THYROID PANEL WITH TSH
Free Thyroxine Index: 2 (ref 1.2–4.9)
T3 Uptake Ratio: 27 % (ref 24–39)
T4, Total: 7.3 ug/dL (ref 4.5–12.0)
TSH: 1.54 u[IU]/mL (ref 0.450–4.500)

## 2013-10-01 LAB — SPECIMEN STATUS REPORT

## 2013-10-01 LAB — VITAMIN D 25 HYDROXY (VIT D DEFICIENCY, FRACTURES): Vit D, 25-Hydroxy: 37.7 ng/mL (ref 30.0–100.0)

## 2013-10-03 HISTORY — DX: Hemochromatosis, unspecified: E83.119

## 2013-10-13 ENCOUNTER — Telehealth: Payer: Self-pay | Admitting: Nurse Practitioner

## 2013-10-13 MED ORDER — LORAZEPAM 2 MG PO TABS: ORAL_TABLET | ORAL | Status: DC

## 2013-10-13 NOTE — Telephone Encounter (Signed)
Please call in lorazepam  1 po qhs with 1 refills

## 2013-10-14 ENCOUNTER — Other Ambulatory Visit: Payer: Self-pay | Admitting: Nurse Practitioner

## 2013-10-14 ENCOUNTER — Ambulatory Visit: Payer: 59 | Admitting: Nurse Practitioner

## 2013-10-15 ENCOUNTER — Other Ambulatory Visit: Payer: Self-pay | Admitting: Family Medicine

## 2013-10-15 NOTE — Telephone Encounter (Signed)
Patient is having lorazepam filled by Gennette Pac

## 2013-10-15 NOTE — Telephone Encounter (Signed)
Called into pharmacy

## 2013-10-19 ENCOUNTER — Encounter (HOSPITAL_COMMUNITY): Payer: Self-pay

## 2013-10-19 ENCOUNTER — Encounter (HOSPITAL_COMMUNITY): Payer: 59 | Attending: Hematology and Oncology

## 2013-10-19 DIAGNOSIS — E785 Hyperlipidemia, unspecified: Secondary | ICD-10-CM | POA: Insufficient documentation

## 2013-10-19 DIAGNOSIS — D61818 Other pancytopenia: Secondary | ICD-10-CM | POA: Diagnosis not present

## 2013-10-19 DIAGNOSIS — F1010 Alcohol abuse, uncomplicated: Secondary | ICD-10-CM | POA: Insufficient documentation

## 2013-10-19 DIAGNOSIS — J40 Bronchitis, not specified as acute or chronic: Secondary | ICD-10-CM | POA: Diagnosis not present

## 2013-10-19 DIAGNOSIS — Z96649 Presence of unspecified artificial hip joint: Secondary | ICD-10-CM | POA: Insufficient documentation

## 2013-10-19 DIAGNOSIS — Z79899 Other long term (current) drug therapy: Secondary | ICD-10-CM | POA: Insufficient documentation

## 2013-10-19 DIAGNOSIS — I472 Ventricular tachycardia, unspecified: Secondary | ICD-10-CM | POA: Insufficient documentation

## 2013-10-19 DIAGNOSIS — F1020 Alcohol dependence, uncomplicated: Secondary | ICD-10-CM

## 2013-10-19 DIAGNOSIS — E039 Hypothyroidism, unspecified: Secondary | ICD-10-CM | POA: Insufficient documentation

## 2013-10-19 DIAGNOSIS — K766 Portal hypertension: Secondary | ICD-10-CM

## 2013-10-19 DIAGNOSIS — D696 Thrombocytopenia, unspecified: Secondary | ICD-10-CM

## 2013-10-19 DIAGNOSIS — Z888 Allergy status to other drugs, medicaments and biological substances status: Secondary | ICD-10-CM | POA: Diagnosis not present

## 2013-10-19 DIAGNOSIS — I10 Essential (primary) hypertension: Secondary | ICD-10-CM

## 2013-10-19 DIAGNOSIS — Z9889 Other specified postprocedural states: Secondary | ICD-10-CM | POA: Diagnosis not present

## 2013-10-19 DIAGNOSIS — M109 Gout, unspecified: Secondary | ICD-10-CM | POA: Diagnosis not present

## 2013-10-19 DIAGNOSIS — I4729 Other ventricular tachycardia: Secondary | ICD-10-CM | POA: Diagnosis not present

## 2013-10-19 DIAGNOSIS — D731 Hypersplenism: Secondary | ICD-10-CM

## 2013-10-19 DIAGNOSIS — D709 Neutropenia, unspecified: Secondary | ICD-10-CM

## 2013-10-19 DIAGNOSIS — E538 Deficiency of other specified B group vitamins: Secondary | ICD-10-CM | POA: Insufficient documentation

## 2013-10-19 LAB — COMPREHENSIVE METABOLIC PANEL
ALK PHOS: 48 U/L (ref 39–117)
ALT: 38 U/L (ref 0–53)
AST: 43 U/L — AB (ref 0–37)
Albumin: 3.9 g/dL (ref 3.5–5.2)
Anion gap: 15 (ref 5–15)
BILIRUBIN TOTAL: 1.5 mg/dL — AB (ref 0.3–1.2)
BUN: 12 mg/dL (ref 6–23)
CHLORIDE: 98 meq/L (ref 96–112)
CO2: 25 meq/L (ref 19–32)
CREATININE: 0.98 mg/dL (ref 0.50–1.35)
Calcium: 9.7 mg/dL (ref 8.4–10.5)
GFR calc Af Amer: >90 mL/min (ref >90)
GFR calc non Af Amer: >90 mL/min (ref >90)
Glucose, Bld: 88 mg/dL (ref 70–99)
Potassium: 4 mEq/L (ref 3.7–5.3)
Sodium: 138 mEq/L (ref 137–147)
Total Protein: 7.7 g/dL (ref 6.0–8.3)

## 2013-10-19 LAB — CBC WITH DIFFERENTIAL/PLATELET
BASOS ABS: 0 10*3/uL (ref 0.0–0.1)
Basophils Relative: 1 % (ref 0–1)
Eosinophils Absolute: 0.1 10*3/uL (ref 0.0–0.7)
Eosinophils Relative: 1 % (ref 0–5)
HEMATOCRIT: 40.8 % (ref 39.0–52.0)
Hemoglobin: 14.4 g/dL (ref 13.0–17.0)
LYMPHS PCT: 16 % (ref 12–46)
Lymphs Abs: 0.9 10*3/uL (ref 0.7–4.0)
MCH: 38.5 pg — ABNORMAL HIGH (ref 26.0–34.0)
MCHC: 35.3 g/dL (ref 30.0–36.0)
MCV: 109.1 fL — ABNORMAL HIGH (ref 78.0–100.0)
MONOS PCT: 12 % (ref 3–12)
Monocytes Absolute: 0.7 10*3/uL (ref 0.1–1.0)
Neutro Abs: 3.7 10*3/uL (ref 1.7–7.7)
Neutrophils Relative %: 70 % (ref 43–77)
Platelets: 125 10*3/uL — ABNORMAL LOW (ref 150–400)
RBC: 3.74 MIL/uL — ABNORMAL LOW (ref 4.22–5.81)
RDW: 14.6 % (ref 11.5–15.5)
WBC: 5.3 10*3/uL (ref 4.0–10.5)

## 2013-10-19 NOTE — Patient Instructions (Signed)
[facility] Discharge Instructions  RECOMMENDATIONS MADE BY THE CONSULTANT AND ANY TEST RESULTS WILL BE SENT TO YOUR REFERRING PHYSICIAN.  We will see you in 3 weeks for follow up and to review your lab results.  Ultrasound as scheduled.   Thank you for choosing [facility] to provide your oncology and hematology care.  To afford each patient quality time with our providers, please arrive at least 15 minutes before your scheduled appointment time.  With your help, our goal is to use those 15 minutes to complete the necessary work-up to ensure our physicians have the information they need to help with your evaluation and healthcare recommendations.    Effective January 1st, 2014, we ask that you re-schedule your appointment with our physicians should you arrive 10 or more minutes late for your appointment.  We strive to give you quality time with our providers, and arriving late affects you and other patients whose appointments are after yours.    Again, thank you for choosing [facility].  Our hope is that these requests will decrease the amount of time that you wait before being seen by our physicians.       _____________________________________________________________  Should you have questions after your visit to [facility], please contact our office at [phone] between the hours of 8:30 a.m. and 4:30 p.m.  Voicemails left after 4:30 p.m. will not be returned until the following business day.  For prescription refill requests, have your pharmacy contact our office with your prescription refill request.    _______________________________________________________________  We hope that we have given you very good care.  You may receive a patient satisfaction survey in the mail, please complete it and return it as soon as possible.  We value your feedback!  _______________________________________________________________  Have  you asked about our STAR program?  STAR stands for Survivorship Training and Rehabilitation, and this is a nationally recognized cancer care program that focuses on survivorship and rehabilitation.  Cancer and cancer treatments may cause problems, such as, pain, making you feel tired and keeping you from doing the things that you need or want to do. Cancer rehabilitation can help. Our goal is to reduce these troubling effects and help you have the best quality of life possible.  You may receive a survey from a nurse that asks questions about your current state of health.  Based on the survey results, all eligible patients will be referred to the [facility] program for an evaluation so we can better serve you!  A frequently asked questions sheet is available upon request.

## 2013-10-19 NOTE — Progress Notes (Signed)
Ketchum A. Barnet Glasgow, M.D.  NEW PATIENT EVALUATION   Name: Christian King Date: 10/19/2013 MRN: [id number] DOB: 25-Jun-1970  PCP: Redge Gainer, MD   REFERRING PHYSICIAN: Lysbeth Penner, FNP  REASON FOR REFERRAL: B12 deficiency, thrombocytopenia, hemachromatosis     HISTORY OF PRESENT ILLNESS:Christian King is a 43 y.o. male who is referred by family physician for evaluation of cytopenia in the setting of hemochromatosis, alcohol abuse, and vitamin B12 deficiency and hypothyroidism. The patient drinks alcohol on a regular basis, either a sixpack of beer or bottle of wine daily. He is currently unemployed. He saw his family physician last week and was told his platelet count was low as was his white cell count. He was given an injection of vitamin B 12. He has not drunk any alcohol for the last 3 days. He denies any tremulousness. He appears today with his wife. He denies any nausea, vomiting, diarrhea, constipation, peripheral paresthesias, easy bruising, epistaxis, melena, hematochezia, hematuria, or hemoptysis. Appetite is fair. HEENT appears episode of ventricular tachycardia on 04/11/2010 and was cardioverted in the emergency room successfully. Intensive cardiac workup following that episode showed no evidence of organic heart disease. The patient claims he was not inebriated at the time of the dysrhythmia.   PAST MEDICAL HISTORY:  has a past medical history of Ventricular tachycardia; HLD (hyperlipidemia); HTN (hypertension); B12 deficiency; Gout; Hypothyroidism; Alcohol abuse; and Bronchitis.     PAST SURGICAL HISTORY: Past Surgical History  Procedure Laterality Date  . 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 Total hip arthroplasty Right      CURRENT MEDICATIONS: has a current medication list which includes the following prescription(s): cialis, diphenhydramine, fenofibrate, lorazepam,  magnesium oxide, metoprolol succinate, potassium chloride, simvastatin, vitamin d (ergocalciferol), and doxycycline, and the following Facility-Administered Medications: cyanocobalamin.   ALLERGIES: Hctz   SOCIAL HISTORY:  reports that he quit smoking about 3 years ago. He does not have any smokeless tobacco history on file. He reports that he drinks alcohol. He reports that he does not use illicit drugs.   FAMILY HISTORY: family history includes Diabetes in his maternal grandmother; Hypertension in his father and paternal grandmother; Stroke in his paternal grandmother.    REVIEW OF SYSTEMS:  Other than that discussed above is noncontributory.    PHYSICAL EXAM:  weight is 163 lb 6.4 oz (74.118 kg). His oral temperature is 98.1 F (36.7 C). His blood pressure is 124/86 and his pulse is 88. His respiration is 18 and oxygen saturation is 98%.    GENERAL:alert, no distress and comfortable SKIN: skin color, texture, turgor are normal, no rashes or significant lesions EYES: normal, Conjunctiva are pink and non-injected, sclera clear OROPHARYNX:no exudate, no erythema and lips, buccal mucosa, and tongue normal  NECK: supple, thyroid normal size, non-tender, without nodularity CHEST: Normal AP diameter with no gynecomastia. No spider angiomata. LYMPH:  no palpable lymphadenopathy in the cervical, axillary or inguinal LUNGS: clear to auscultation and percussion with normal breathing effort HEART: regular rate & rhythm and no murmurs ABDOMEN:abdomen soft, non-tender and normal bowel sounds. Spleen tip not palpable. Liver 12 x 6 cm, nontender. No free fluid wave or shifting dullness. MUSCULOSKELETALl:no cyanosis of digits, no clubbing or edema  NEURO: alert & oriented x 3 with fluent speech, no focal motor/sensory deficits. No evidence of asterixis.    LABORATORY DATA:  Office Visit on 10/19/2013  Component  Date Value Ref Range Status  . WBC 10/19/2013 5.3  4.0 - 10.5 K/uL Final  . RBC  10/19/2013 3.74* 4.22 - 5.81 MIL/uL Final  . Hemoglobin 10/19/2013 14.4  13.0 - 17.0 g/dL Final  . HCT 10/19/2013 40.8  39.0 - 52.0 % Final  . MCV 10/19/2013 109.1* 78.0 - 100.0 fL Final  . MCH 10/19/2013 38.5* 26.0 - 34.0 pg Final  . MCHC 10/19/2013 35.3  30.0 - 36.0 g/dL Final  . RDW 10/19/2013 14.6  11.5 - 15.5 % Final  . Platelets 10/19/2013 125* 150 - 400 K/uL Final  . Neutrophils Relative % 10/19/2013 70  43 - 77 % Final  . Neutro Abs 10/19/2013 3.7  1.7 - 7.7 K/uL Final  . Lymphocytes Relative 10/19/2013 16  12 - 46 % Final  . Lymphs Abs 10/19/2013 0.9  0.7 - 4.0 K/uL Final  . Monocytes Relative 10/19/2013 12  3 - 12 % Final  . Monocytes Absolute 10/19/2013 0.7  0.1 - 1.0 K/uL Final  . Eosinophils Relative 10/19/2013 1  0 - 5 % Final  . Eosinophils Absolute 10/19/2013 0.1  0.0 - 0.7 K/uL Final  . Basophils Relative 10/19/2013 1  0 - 1 % Final  . Basophils Absolute 10/19/2013 0.0  0.0 - 0.1 K/uL Final  . Sodium 10/19/2013 138  137 - 147 mEq/L Final  . Potassium 10/19/2013 4.0  3.7 - 5.3 mEq/L Final  . Chloride 10/19/2013 98  96 - 112 mEq/L Final  . CO2 10/19/2013 25  19 - 32 mEq/L Final  . Glucose, Bld 10/19/2013 88  70 - 99 mg/dL Final  . BUN 10/19/2013 12  6 - 23 mg/dL Final  . Creatinine, Ser 10/19/2013 0.98  0.50 - 1.35 mg/dL Final  . Calcium 10/19/2013 9.7  8.4 - 10.5 mg/dL Final  . Total Protein 10/19/2013 7.7  6.0 - 8.3 g/dL Final  . Albumin 10/19/2013 3.9  3.5 - 5.2 g/dL Final  . AST 10/19/2013 43* 0 - 37 U/L Final  . ALT 10/19/2013 38  0 - 53 U/L Final  . Alkaline Phosphatase 10/19/2013 48  39 - 117 U/L Final  . Total Bilirubin 10/19/2013 1.5* 0.3 - 1.2 mg/dL Final  . GFR calc non Af Amer 10/19/2013 >90  >90 mL/min Final  . GFR calc Af Amer 10/19/2013 >90  >90 mL/min Final   Comment: (NOTE)                          The eGFR has been calculated using the CKD EPI equation.                          This calculation has not been validated in all clinical situations.                           eGFR's persistently <90 mL/min signify possible Chronic Kidney                          Disease.  . Anion gap 10/19/2013 15  5 - 15 Final  Office Visit on 09/29/2013  Component Date Value Ref Range Status  . RMSF IgG 09/29/2013 Negative  Negative Final  . RMSF IgM 09/29/2013 0.44  0.00 - 0.89 index Final   Comment:  Negative        <0.90                                                           Equivocal 0.90 - 1.10                                                           Positive        >1.10  . Lyme IgG/IgM Ab 09/29/2013 <0.91  0.00 - 0.90 ISR Final   Comment:                                 Negative         <0.91                                                          Equivocal  0.91 - 1.09                                                          Positive         >1.09  . LYME DISEASE AB, QUANT, IGM 09/29/2013 <0.80  0.00 - 0.79 index Final   Comment:                                 Negative         <0.80                                                          Equivocal  0.80 - 1.19                                                          Positive         >1.19                           IgM levels may peak at 3-6 weeks post infection, then                           gradually decline.  . Vit D, 25-Hydroxy 09/29/2013 37.7  30.0 - 100.0 ng/mL Final   Comment: Vitamin D deficiency has been defined  by the [place] practice guideline as a                          level of serum 25-OH vitamin D less than 20 ng/mL (1,2).                          The Endocrine Society went on to further define vitamin D                          insufficiency as a level between 21 and 29 ng/mL (2).                          1. IOM (Institute of Medicine). 2010. Dietary reference                             intakes for calcium and D. [place]: The                             [employer].                          2. Holick MF, Binkley [place], Bischoff-Ferrari HA, et al.                             Evaluation, treatment, and prevention of vitamin D                             deficiency: an Endocrine Society clinical practice                             guideline. JCEM. 2011 Jul; 96(7):1911-30.  Marland Kitchen TSH 09/29/2013 1.540  0.450 - 4.500 uIU/mL Final  . T4, Total 09/29/2013 7.3  4.5 - 12.0 ug/dL Final  . T3 Uptake Ratio 09/29/2013 27  24 - 39 % Final  . Free Thyroxine Index 09/29/2013 2.0  1.2 - 4.9 Final  . Glucose 09/29/2013 169* 65 - 99 mg/dL Final   Comment: Specimen received in contact with cells. No visible hemolysis                          present. However GLUC may be decreased and K increased. Clinical                          correlation indicated.  . BUN 09/29/2013 5* 6 - 24 mg/dL Final  . Creatinine, Ser 09/29/2013 0.93  0.76 - 1.27 mg/dL Final  . GFR calc non Af Amer 09/29/2013 99  >59 mL/min/1.73 Final  . GFR calc Af Amer 09/29/2013 114  >59 mL/min/1.73 Final  . BUN/Creatinine Ratio 09/29/2013 5* 9 - 20 Final  . Sodium 09/29/2013 142  134 - 144 mmol/L Final  . Potassium 09/29/2013 3.6  3.5 - 5.2 mmol/L Final  . Chloride 09/29/2013 97  97 -  108 mmol/L Final  . CO2 09/29/2013 24  18 - 29 mmol/L Final  . Calcium 09/29/2013 8.9  8.7 - 10.2 mg/dL Final  . Total Protein 09/29/2013 7.0  6.0 - 8.5 g/dL Final  . Albumin 09/29/2013 4.1  3.5 - 5.5 g/dL Final  . Globulin, Total 09/29/2013 2.9  1.5 - 4.5 g/dL Final  . Albumin/Globulin Ratio 09/29/2013 1.4  1.1 - 2.5 Final  . Total Bilirubin 09/29/2013 0.9  0.0 - 1.2 mg/dL Final  . Alkaline Phosphatase 09/29/2013 51  39 - 117 IU/L Final  . AST 09/29/2013 129* 0 - 40 IU/L Final  . ALT 09/29/2013 65* 0 - 44 IU/L Final  . specimen status report 09/29/2013 Comment   Final   Comment: Verbal Order                          See below:                          Comment:                          The [place] States Code  of [employer] requires a written and                          signed request be forwarded to a laboratory following a verbal order                          of a laboratory test.  Please assist [place] to meet this requirement and                          to complete our records.                          Date:______________________________                          ICD-9/Diagnosis Code(s):______________________________________________                          Physician or Authorized Designee:_____________________________________                                                                        Please Print                          Physician or Authorized Designee Signature:                          ______________________________________________________________________                          Your Animal nutritionist Your Order Of The Test(s) Listed                          Please provide requested information and fax to  [phone].                          Additional Test(s) Requested                          Comment:                          Test(s) added per APRIL HODGES at account 09-30-2013                          Logged by Jonell Cluck                          Test# [id number] Anemia Profile B                          Diagnosis Codes Provided                             780.79     919.4      E906.4  . TIBC 09/29/2013 336  250 - 450 ug/dL Final  . UIBC 09/29/2013 56* 150 - 375 ug/dL Final  . Iron 09/29/2013 280* 40 - 155 ug/dL Final  . Iron Saturation 09/29/2013 83* 15 - 55 % Final  . Ferritin 09/29/2013 706* 30 - 400 ng/mL Final  . Vitamin B-12 09/29/2013 >1999* 211 - 946 pg/mL Final  . Folate 09/29/2013 2.9* >3.0 ng/mL Final   Comment: A serum folate concentration of less than 3.1 ng/mL is                          considered to represent clinical deficiency.  . WBC 09/29/2013 3.3* 3.4 - 10.8 x10E3/uL Final  . RBC 09/29/2013 3.73* 4.14 - 5.80 x10E6/uL Final  . Hemoglobin  09/29/2013 13.9  12.6 - 17.7 g/dL Final  . HCT 09/29/2013 40.3  37.5 - 51.0 % Final  . MCV 09/29/2013 108* 79 - 97 fL Final  . MCH 09/29/2013 37.3* 26.6 - 33.0 pg Final  . MCHC 09/29/2013 34.5  31.5 - 35.7 g/dL Final  . RDW 09/29/2013 15.5* 12.3 - 15.4 % Final  . Platelets 09/29/2013 82* 150 - 379 x10E3/uL Final   Comment: Actual platelet count may be somewhat higher than reported due to                          aggregation of platelets in this sample.  . Neutrophils Relative % 09/29/2013 62   Final  . Lymphs 09/29/2013 23   Final  . Monocytes 09/29/2013 11   Final  . Eos 09/29/2013 2   Final  . Basos 09/29/2013 2   Final  . Neutrophils Absolute 09/29/2013 2.1  1.4 - 7.0 x10E3/uL Final  . Lymphocytes Absolute 09/29/2013 0.8  0.7 - 3.1 x10E3/uL Final  . Monocytes Absolute 09/29/2013 0.4  0.1 - 0.9 x10E3/uL Final  . Eosinophils Absolute 09/29/2013 0.1  0.0 - 0.4 x10E3/uL Final  . Basophils Absolute 09/29/2013 0.1  0.0 - 0.2 x10E3/uL Final  . Immature Granulocytes 09/29/2013 0   Final  . Immature Grans (Abs) 09/29/2013 0.0  0.0 - 0.1 x10E3/uL Final  . Hematology Comments: 09/29/2013 Note:  Final   Verified by microscopic examination.  . Retic Ct Pct 09/29/2013 1.1  0.6 - 2.6 % Final  . specimen status report 09/29/2013 Comment   Final   Comment: Written Authorization                          Written Authorization                          Written Authorization Received.                          Authorization received from Stevan Born 10-01-2013                          Logged by [facility]    Urinalysis No results found for this basename: colorurine,  appearanceur,  labspec,  phurine,  glucoseu,  hgbur,  bilirubinur,  ketonesur,  proteinur,  urobilinogen,  nitrite,  leukocytesur      _0 : No results found.  PATHOLOGY: Peripheral smear failed to reveal evidence of platelet clumping.   IMPRESSION:  #1. Pancytopenia probably secondary to portal hypertension and  hypersplenism. #2. Elevated serum iron and ferritin, possibly due to alcohol versus hemochromatosis. #3. Borderline low vitamin B12 level on 05/26/2013, now greater than 2000. #4. Chronic alcohol abuse with macrocytosis, low folate level. #5. Episode of ventricular tachycardia, status post cardioversion, no evidence of organic heart disease on subsequent workup.   PLAN:  #1. Continue alcohol abstention. #2. Ultrasound elastogram to estimate degree of hepatic fibrosis and portal hypertension. #3. Hemochromatosis gene analysis. #4. Followup in 4 weeks with CBC.  I appreciate the opportunity sharing in his care.   Doroteo Bradford, MD 10/19/2013 7:24 PM   DISCLAIMER:  This note was dictated with voice recognition softwre.  Similar sounding words can inadvertently be transcribed inaccurately and may not be corrected upon review.

## 2013-10-20 ENCOUNTER — Other Ambulatory Visit (HOSPITAL_COMMUNITY): Payer: Self-pay | Admitting: Hematology and Oncology

## 2013-10-20 LAB — VITAMIN B12: Vitamin B-12: 440 pg/mL (ref 211–911)

## 2013-10-20 LAB — FOLATE: Folate: 2.3 ng/mL — ABNORMAL LOW

## 2013-10-20 LAB — FERRITIN: Ferritin: 741 ng/mL — ABNORMAL HIGH (ref 22–322)

## 2013-10-20 LAB — IRON AND TIBC
IRON: 87 ug/dL (ref 42–135)
Saturation Ratios: 24 % (ref 20–55)
TIBC: 361 ug/dL (ref 215–435)
UIBC: 274 ug/dL (ref 125–400)

## 2013-10-20 MED ORDER — FOLIC ACID 1 MG PO TABS: 1.0000 mg | ORAL_TABLET | Freq: Every day | ORAL | Status: DC

## 2013-10-21 LAB — ERYTHROPOIETIN: Erythropoietin: 6.3 m[IU]/mL (ref 2.6–18.5)

## 2013-10-22 ENCOUNTER — Ambulatory Visit (HOSPITAL_COMMUNITY)
Admission: RE | Admit: 2013-10-22 | Discharge: 2013-10-22 | Disposition: A | Discharge status: Home / Self Care | Payer: 59 | Source: Ambulatory Visit | Attending: Hematology and Oncology | Admitting: Hematology and Oncology

## 2013-10-22 DIAGNOSIS — D696 Thrombocytopenia, unspecified: Secondary | ICD-10-CM

## 2013-10-23 LAB — SOLUBLE TRANSFERRIN RECEPTOR: TRANSFERRIN RECEPTOR, SOLUBLE: 1.48 mg/L (ref 0.76–1.76)

## 2013-10-24 LAB — HEMOCHROMATOSIS DNA-PCR(C282Y,H63D)

## 2013-11-08 ENCOUNTER — Other Ambulatory Visit: Payer: Self-pay | Admitting: Internal Medicine

## 2013-11-09 ENCOUNTER — Encounter (HOSPITAL_COMMUNITY): Payer: Self-pay

## 2013-11-09 ENCOUNTER — Encounter (HOSPITAL_COMMUNITY): Payer: 59 | Attending: Hematology and Oncology

## 2013-11-09 ENCOUNTER — Encounter (HOSPITAL_BASED_OUTPATIENT_CLINIC_OR_DEPARTMENT_OTHER): Payer: 59

## 2013-11-09 VITALS — BP 103/84 | HR 86 | Temp 98.1°F | Resp 18 | Wt 167.0 lb

## 2013-11-09 DIAGNOSIS — D61818 Other pancytopenia: Secondary | ICD-10-CM

## 2013-11-09 DIAGNOSIS — E039 Hypothyroidism, unspecified: Secondary | ICD-10-CM | POA: Insufficient documentation

## 2013-11-09 DIAGNOSIS — E785 Hyperlipidemia, unspecified: Secondary | ICD-10-CM | POA: Insufficient documentation

## 2013-11-09 DIAGNOSIS — E538 Deficiency of other specified B group vitamins: Secondary | ICD-10-CM | POA: Insufficient documentation

## 2013-11-09 DIAGNOSIS — I4729 Other ventricular tachycardia: Secondary | ICD-10-CM | POA: Diagnosis not present

## 2013-11-09 DIAGNOSIS — J40 Bronchitis, not specified as acute or chronic: Secondary | ICD-10-CM | POA: Diagnosis not present

## 2013-11-09 DIAGNOSIS — F1010 Alcohol abuse, uncomplicated: Secondary | ICD-10-CM | POA: Diagnosis not present

## 2013-11-09 DIAGNOSIS — D696 Thrombocytopenia, unspecified: Secondary | ICD-10-CM | POA: Diagnosis present

## 2013-11-09 DIAGNOSIS — I472 Ventricular tachycardia, unspecified: Secondary | ICD-10-CM | POA: Insufficient documentation

## 2013-11-09 DIAGNOSIS — Z79899 Other long term (current) drug therapy: Secondary | ICD-10-CM | POA: Diagnosis not present

## 2013-11-09 DIAGNOSIS — I10 Essential (primary) hypertension: Secondary | ICD-10-CM | POA: Diagnosis not present

## 2013-11-09 DIAGNOSIS — Z96649 Presence of unspecified artificial hip joint: Secondary | ICD-10-CM | POA: Diagnosis not present

## 2013-11-09 DIAGNOSIS — Z9889 Other specified postprocedural states: Secondary | ICD-10-CM | POA: Insufficient documentation

## 2013-11-09 DIAGNOSIS — M109 Gout, unspecified: Secondary | ICD-10-CM | POA: Diagnosis not present

## 2013-11-09 DIAGNOSIS — Z23 Encounter for immunization: Secondary | ICD-10-CM

## 2013-11-09 DIAGNOSIS — Z888 Allergy status to other drugs, medicaments and biological substances status: Secondary | ICD-10-CM | POA: Diagnosis not present

## 2013-11-09 LAB — CBC WITH DIFFERENTIAL/PLATELET
BASOS ABS: 0 10*3/uL (ref 0.0–0.1)
BASOS PCT: 0 % (ref 0–1)
Eosinophils Absolute: 0.1 10*3/uL (ref 0.0–0.7)
Eosinophils Relative: 1 % (ref 0–5)
HCT: 40.6 % (ref 39.0–52.0)
Hemoglobin: 13.8 g/dL (ref 13.0–17.0)
Lymphocytes Relative: 19 % (ref 12–46)
Lymphs Abs: 1.2 10*3/uL (ref 0.7–4.0)
MCH: 36.4 pg — ABNORMAL HIGH (ref 26.0–34.0)
MCHC: 34 g/dL (ref 30.0–36.0)
MCV: 107.1 fL — ABNORMAL HIGH (ref 78.0–100.0)
Monocytes Absolute: 0.4 10*3/uL (ref 0.1–1.0)
Monocytes Relative: 6 % (ref 3–12)
NEUTROS ABS: 5 10*3/uL (ref 1.7–7.7)
NEUTROS PCT: 74 % (ref 43–77)
Platelets: 277 10*3/uL (ref 150–400)
RBC: 3.79 MIL/uL — ABNORMAL LOW (ref 4.22–5.81)
RDW: 14 % (ref 11.5–15.5)
WBC: 6.7 10*3/uL (ref 4.0–10.5)

## 2013-11-09 MED ORDER — INFLUENZA VAC SPLIT QUAD 0.5 ML IM SUSY
PREFILLED_SYRINGE | INTRAMUSCULAR | Status: AC
  Filled 2013-11-09: qty 0.5

## 2013-11-09 MED ORDER — INFLUENZA VAC SPLIT QUAD 0.5 ML IM SUSY
0.5000 mL | PREFILLED_SYRINGE | Freq: Once | INTRAMUSCULAR | Status: AC
  Administered 2013-11-09: 0.5 mL via INTRAMUSCULAR

## 2013-11-09 NOTE — Progress Notes (Signed)
..  Marolyn Hammock Kofman's reason for visit today are for labs as scheduled per MD orders.  Venipuncture performed with a 23 gauge butterfly needle to R Antecubital. Good blood return.  Needle removed intact.  [name] tolerated venipuncture well and without incident.

## 2013-11-09 NOTE — Patient Instructions (Signed)
[facility] Discharge Instructions  RECOMMENDATIONS MADE BY THE CONSULTANT AND ANY TEST RESULTS WILL BE SENT TO YOUR REFERRING PHYSICIAN.  EXAM FINDINGS BY THE PHYSICIAN TODAY AND SIGNS OR SYMPTOMS TO REPORT TO CLINIC OR PRIMARY PHYSICIAN: Exam and findings as discussed by Dr. Zigmund Daniel.   INSTRUCTIONS/FOLLOW-UP:  Return in 6 months for office visit and blood work.  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 Join Alcoholics Anonymous   Thank you for choosing [facility] to provide your oncology and hematology care.  To afford each patient quality time with our providers, please arrive at least 15 minutes before your scheduled appointment time.  With your help, our goal is to use those 15 minutes to complete the necessary work-up to ensure our physicians have the information they need to help with your evaluation and healthcare recommendations.    Effective January 1st, 2014, we ask that you re-schedule your appointment with our physicians should you arrive 10 or more minutes late for your appointment.  We strive to give you quality time with our providers, and arriving late affects you and other patients whose appointments are after yours.    Again, thank you for choosing [facility].  Our hope is that these requests will decrease the amount of time that you wait before being seen by our physicians.       _____________________________________________________________  Should you have questions after your visit to [facility], please contact our office at [phone] between the hours of 8:30 a.m. and 4:30 p.m.  Voicemails left after 4:30 p.m. will not be returned until the following business day.  For prescription refill requests, have your pharmacy contact our office with your prescription refill request.     _______________________________________________________________  We hope that we have given you very good care.  You may receive a patient satisfaction survey in the mail, please complete it and return it as soon as possible.  We value your feedback!  _______________________________________________________________  Have you asked about our STAR program?  STAR stands for Survivorship Training and Rehabilitation, and this is a nationally recognized cancer care program that focuses on survivorship and rehabilitation.  Cancer and cancer treatments may cause problems, such as, pain, making you feel tired and keeping you from doing the things that you need or want to do. Cancer rehabilitation can help. Our goal is to reduce these troubling effects and help you have the best quality of life possible.  You may receive a survey from a nurse that asks questions about your current state of health.  Based on the survey results, all eligible patients will be referred to the [facility] program for an evaluation so we can better serve you!  A frequently asked questions sheet is available upon request. Alcohol Use Disorder Alcohol use disorder is a mental disorder. It is not a one-time incident of heavy drinking. Alcohol use disorder is the excessive and uncontrollable use of alcohol over time that leads to problems with functioning in one or more areas of daily living. People with this disorder risk harming themselves and others when they drink to excess. Alcohol use disorder also can cause other mental disorders, such as mood and anxiety disorders, and serious physical problems. People with alcohol use disorder often misuse other drugs.  Alcohol use disorder is common and widespread. Some people with this disorder drink alcohol to cope with  or escape from negative life events. Others drink to relieve chronic pain or symptoms of mental illness. People with a family history of alcohol use disorder are at higher risk of  losing control and using alcohol to excess.  SYMPTOMS  Signs and symptoms of alcohol use disorder may include the following:   Consumption ofalcohol inlarger amounts or over a longer period of time than intended.  Multiple unsuccessful attempts to cutdown or control alcohol use.   A great deal of time spent obtaining alcohol, using alcohol, or recovering from the effects of alcohol (hangover).  A strong desire or urge to use alcohol (cravings).   Continued use of alcohol despite problems at work, school, or home because of alcohol use.   Continued use of alcohol despite problems in relationships because of alcohol use.  Continued use of alcohol in situations when it is physically hazardous, such as driving a car.  Continued use of alcohol despite awareness of a physical or psychological problem that is likely related to alcohol use. Physical problems related to alcohol use can involve the brain, heart, liver, stomach, and intestines. Psychological problems related to alcohol use include intoxication, depression, anxiety, psychosis, delirium, and dementia.   The need for increased amounts of alcohol to achieve the same desired effect, or a decreased effect from the consumption of the same amount of alcohol (tolerance).  Withdrawal symptoms upon reducing or stopping alcohol use, or alcohol use to reduce or avoid withdrawal symptoms. Withdrawal symptoms include:  Racing heart.  Hand tremor.  Difficulty sleeping.  Nausea.  Vomiting.  Hallucinations.  Restlessness.  Seizures. DIAGNOSIS Alcohol use disorder is diagnosed through an assessment by your health care provider. Your health care provider may start by asking three or four questions to screen for excessive or problematic alcohol use. To confirm a diagnosis of alcohol use disorder, at least two symptoms must be present within a 50-month period. The severity of alcohol use disorder depends on the number of  symptoms:  Mild--two or three.  Moderate--four or five.  Severe--six or more. Your health care provider may perform a physical exam or use results from lab tests to see if you have physical problems resulting from alcohol use. Your health care provider may refer you to a mental health professional for evaluation. TREATMENT  Some people with alcohol use disorder are able to reduce their alcohol use to low-risk levels. Some people with alcohol use disorder need to quit drinking alcohol. When necessary, mental health professionals with specialized training in substance use treatment can help. Your health care provider can help you decide how severe your alcohol use disorder is and what type of treatment you need. The following forms of treatment are available:   Detoxification. Detoxification involves the use of prescription medicines to prevent alcohol withdrawal symptoms in the first week after quitting. This is important for people with a history of symptoms of withdrawal and for heavy drinkers who are likely to have withdrawal symptoms. Alcohol withdrawal can be dangerous and, in severe cases, cause death. Detoxification is usually provided in a hospital or in-patient substance use treatment facility.  Counseling or talk therapy. Talk therapy is provided by substance use treatment counselors. It addresses the reasons people use alcohol and ways to keep them from drinking again. The goals of talk therapy are to help people with alcohol use disorder find healthy activities and ways to cope with life stress, to identify and avoid triggers for alcohol use, and to handle cravings, which can cause relapse.  Medicines.Different medicines can help treat alcohol use disorder through the following actions:  Decrease alcohol cravings.  Decrease the positive reward response felt from alcohol use.  Produce an uncomfortable physical reaction when alcohol is used (aversion therapy).  Support groups.  Support groups are run by people who have quit drinking. They provide emotional support, advice, and guidance. These forms of treatment are often combined. Some people with alcohol use disorder benefit from intensive combination treatment provided by specialized substance use treatment centers. Both inpatient and outpatient treatment programs are available. Document Released: 03/15/2004 Document Revised: 06/22/2013 Document Reviewed: 05/15/2012 [facility] Patient Information 2015 [place], [place]. This information is not intended to replace advice given to you by your health care provider. Make sure you discuss any questions you have with your health care provider.

## 2013-11-09 NOTE — Progress Notes (Signed)
[place]  OFFICE PROGRESS NOTE  [address], [place] [address]  DIAGNOSIS: Thrombocytopenia  Pancytopenia - Plan: CBC with Differential, Ferritin, Ferritin  Chief Complaint  Patient presents with  . Pancytopenia    CURRENT THERAPY: Completed workup for pancytopenia  INTERVAL HISTORY: Christian King 43 y.o. male returns for followup after additional testing to explain pancytopenia in the setting of possible hereditary hemochromatosis.  Except for 3 days since his last visit on 10/19/2013, he has abstained from alcohol. He denies abdominal pain, epistaxis, hemoptysis, melena, medication, abdominal pain, fever, night sweats, lower 70 swelling or redness, skin rash, headache, or seizures.  MEDICAL HISTORY: Past Medical History  Diagnosis Date  . Ventricular tachycardia     in setting of hypokalemia/ hypomagnesemia and ETOH  . HLD (hyperlipidemia)   . HTN (hypertension)   . B12 deficiency   . Gout   . Hypothyroidism   . Alcohol abuse   . Bronchitis     INTERIM HISTORY: has HYPOTHYROIDISM; B12 DEFICIENCY; HYPERLIPIDEMIA; GOUT; HYPERTENSION; VENTRICULAR TACHYCARDIA; Alcohol abuse; and Other hemochromatosis on his problem list.    ALLERGIES:  is allergic to hctz.  MEDICATIONS: has a current medication list which includes the following prescription(s): cialis, diphenhydramine, doxycycline, fenofibrate, folic acid, lorazepam, magnesium oxide, metoprolol succinate, potassium chloride, potassium chloride, simvastatin, and vitamin d (ergocalciferol), and the following Facility-Administered Medications: cyanocobalamin.  SURGICAL HISTORY:  Past Surgical History  Procedure Laterality Date  . 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 Total hip arthroplasty Right     FAMILY HISTORY: family history includes Diabetes in his maternal grandmother; Hypertension in his father and paternal  grandmother; Stroke in his paternal grandmother.  SOCIAL HISTORY:  reports that he quit smoking about 3 years ago. He does not have any smokeless tobacco history on file. He reports that he does not drink alcohol or use illicit drugs.  REVIEW OF SYSTEMS:  Other than that discussed above is noncontributory.  PHYSICAL EXAMINATION: ECOG PERFORMANCE STATUS: 1 - Symptomatic but completely ambulatory  Blood pressure 103/84, pulse 86, temperature 98.1 F (36.7 C), temperature source Oral, resp. rate 18, weight 167 lb (75.751 kg), SpO2 100.00%.  GENERAL:alert, no distress and comfortable SKIN: skin color, texture, turgor are normal, no rashes or significant lesions EYES: PERLA; Conjunctiva are pink and non-injected, sclera clear SINUSES: No redness or tenderness over maxillary or ethmoid sinuses OROPHARYNX:no exudate, no erythema on lips, buccal mucosa, or tongue. NECK: supple, thyroid normal size, non-tender, without nodularity. No masses CHEST: Normal AP diameter with no gynecomastia. LYMPH:  no palpable lymphadenopathy in the cervical, axillary or inguinal LUNGS: clear to auscultation and percussion with normal breathing effort HEART: regular rate & rhythm and no murmurs. ABDOMEN:abdomen soft, non-tender and normal bowel sounds. Liver not palpable. No free fluid or shifting dullness. MUSCULOSKELETAL:no cyanosis of digits and no clubbing. Range of motion normal.  NEURO: alert & oriented x 3 with fluent speech, no focal motor/sensory deficits   LABORATORY DATA: Lab on 11/09/2013  Component Date Value Ref Range Status  . WBC 11/09/2013 6.7  4.0 - 10.5 K/uL Final  . RBC 11/09/2013 3.79* 4.22 - 5.81 MIL/uL Final  . Hemoglobin 11/09/2013 13.8  13.0 - 17.0 g/dL Final  . HCT 11/09/2013 40.6  39.0 - 52.0 % Final  . MCV 11/09/2013 107.1* 78.0 - 100.0 fL Final  . MCH 11/09/2013 36.4* 26.0 - 34.0 pg  Final  . MCHC 11/09/2013 34.0  30.0 - 36.0 g/dL Final  . RDW 11/09/2013 14.0  11.5 - 15.5 % Final    . Platelets 11/09/2013 277  150 - 400 K/uL Final  . Neutrophils Relative % 11/09/2013 74  43 - 77 % Final  . Neutro Abs 11/09/2013 5.0  1.7 - 7.7 K/uL Final  . Lymphocytes Relative 11/09/2013 19  12 - 46 % Final  . Lymphs Abs 11/09/2013 1.2  0.7 - 4.0 K/uL Final  . Monocytes Relative 11/09/2013 6  3 - 12 % Final  . Monocytes Absolute 11/09/2013 0.4  0.1 - 1.0 K/uL Final  . Eosinophils Relative 11/09/2013 1  0 - 5 % Final  . Eosinophils Absolute 11/09/2013 0.1  0.0 - 0.7 K/uL Final  . Basophils Relative 11/09/2013 0  0 - 1 % Final  . Basophils Absolute 11/09/2013 0.0  0.0 - 0.1 K/uL Final  Office Visit on 10/19/2013  Component Date Value Ref Range Status  . WBC 10/19/2013 5.3  4.0 - 10.5 K/uL Final  . RBC 10/19/2013 3.74* 4.22 - 5.81 MIL/uL Final  . Hemoglobin 10/19/2013 14.4  13.0 - 17.0 g/dL Final  . HCT 10/19/2013 40.8  39.0 - 52.0 % Final  . MCV 10/19/2013 109.1* 78.0 - 100.0 fL Final  . MCH 10/19/2013 38.5* 26.0 - 34.0 pg Final  . MCHC 10/19/2013 35.3  30.0 - 36.0 g/dL Final  . RDW 10/19/2013 14.6  11.5 - 15.5 % Final  . Platelets 10/19/2013 125* 150 - 400 K/uL Final  . Neutrophils Relative % 10/19/2013 70  43 - 77 % Final  . Neutro Abs 10/19/2013 3.7  1.7 - 7.7 K/uL Final  . Lymphocytes Relative 10/19/2013 16  12 - 46 % Final  . Lymphs Abs 10/19/2013 0.9  0.7 - 4.0 K/uL Final  . Monocytes Relative 10/19/2013 12  3 - 12 % Final  . Monocytes Absolute 10/19/2013 0.7  0.1 - 1.0 K/uL Final  . Eosinophils Relative 10/19/2013 1  0 - 5 % Final  . Eosinophils Absolute 10/19/2013 0.1  0.0 - 0.7 K/uL Final  . Basophils Relative 10/19/2013 1  0 - 1 % Final  . Basophils Absolute 10/19/2013 0.0  0.0 - 0.1 K/uL Final  . Sodium 10/19/2013 138  137 - 147 mEq/L Final  . Potassium 10/19/2013 4.0  3.7 - 5.3 mEq/L Final  . Chloride 10/19/2013 98  96 - 112 mEq/L Final  . CO2 10/19/2013 25  19 - 32 mEq/L Final  . Glucose, Bld 10/19/2013 88  70 - 99 mg/dL Final  . BUN 10/19/2013 12  6 - 23 mg/dL  Final  . Creatinine, Ser 10/19/2013 0.98  0.50 - 1.35 mg/dL Final  . Calcium 10/19/2013 9.7  8.4 - 10.5 mg/dL Final  . Total Protein 10/19/2013 7.7  6.0 - 8.3 g/dL Final  . Albumin 10/19/2013 3.9  3.5 - 5.2 g/dL Final  . AST 10/19/2013 43* 0 - 37 U/L Final  . ALT 10/19/2013 38  0 - 53 U/L Final  . Alkaline Phosphatase 10/19/2013 48  39 - 117 U/L Final  . Total Bilirubin 10/19/2013 1.5* 0.3 - 1.2 mg/dL Final  . GFR calc non Af Amer 10/19/2013 >90  >90 mL/min Final  . GFR calc Af Amer 10/19/2013 >90  >90 mL/min Final   Comment: (NOTE)                          The eGFR has  been calculated using the CKD EPI equation.                          This calculation has not been validated in all clinical situations.                          eGFR's persistently <90 mL/min signify possible Chronic Kidney                          Disease.  . Anion gap 10/19/2013 15  5 - 15 Final  . Vitamin B-12 10/19/2013 440  211 - 911 pg/mL Final   Performed at [facility]  . Folate 10/19/2013 2.3*  Final   Comment: (NOTE)                          Reference Ranges                                 Deficient:       0.4 - 3.3 ng/mL                                 Indeterminate:   3.4 - 5.4 ng/mL                                 Normal:              > 5.4 ng/mL                          Performed at [facility]  . Transferrin Receptor, Soluble 10/19/2013 1.48  0.76 - 1.76 mg/L Final   Performed at [facility]  . Ferritin 10/19/2013 741* 22 - 322 ng/mL Final   Performed at [facility]  . Iron 10/19/2013 87  42 - 135 ug/dL Final  . TIBC 10/19/2013 361  215 - 435 ug/dL Final  . Saturation Ratios 10/19/2013 24  20 - 55 % Final  . UIBC 10/19/2013 274  125 - 400 ug/dL Final   Performed at [facility]  . Erythropoietin 10/19/2013 6.3  2.6 - 18.5 mIU/mL Final   Performed at [facility]  . DNA Mutation Analysis 10/19/2013 REPORT   Final   Comment: (NOTE)                           DNA Mutation Analysis                                                                               RESULT: HETEROZYGOUS FOR THE H63D MUTATION  INTERPRETATION: DNA testing indicates that this                          individual is positive for one copy of H63D mutation in                          HFE gene.  This individual is negative for the C282Y                          mutation.  This result reduces the likelihood of                          hereditary hemochromatosis (HH) in this individual.                          However, it does not rule out the presence of other                          mutations within the HFE gene or a diagnosis of HH.                          The risk of this individual carrying a HFE mutation                          other than those tested in this assay depends greatly                          on family and clinical history as well as ethnicity.                          This assay does not test for other primary or secondary                          iron overload disorders.  Consider genetic counseling                          and DNA testing for at-risk family members.                                                                               Masamichi Dillon Bjork, Ph.D., [facility]                          Director, Molecular Genetics                          Hereditary hemochromatosis [facility]) is an autosomal                          recessive disorder of iron metabolism that results in  iron overload and potential organ failure.  It is one                          of the most common genetic disorders in individuals of                          European-Caucasian ancestry, with an estimated carrier                          frequency of 10%.  HH is caused by mutations in the HFE                          gene.  Most individuals with HH (60-90%) are homozygous                           for the C282Y mutation.  A smaller percentage of                          affected individuals are either compound heterozygous                          for the C282Y and H63D mutations (3%-8%), or homozygous                          for the H63D mutation (approximately 1%).                          This assay detects the two mutations in the HFE gene,                          C282Y (NM_000410.2: c.845G>A) and H63D ([id number]: c.                          187C>G), that are commonly associated with HH.  The                          mutations are detected by multiplex-polymerase chain                          reaction (PCR) amplification, followed by digestion of                          the amplification products with the restriction enzymes                          Rsal and NlaIII, for the detection of the C282Y and                          H63D mutations respectively.  Fluorescent-labeled                          restriction fragments are detected by capillary                          electrophoresis.  This assay does not detect other mutations in the HFE                          gene that can cause HH.  Since genetic variation and                          other factors can affect the accuracy of direct                          mutation testing, these results should be interpreted                          in light of clinical and familial data.                          For assistance with interpretation of these results,                          please contact your local [facility] genetic                          counselor or call [phone]).                          This test was developed and its performance                          characteristics have been determined by [facility], [place], [place].                          Performance characteristics refer to the                           analytical performance of the test.                          For more information on this test, go to                          http://education.questdiagnostics.com/faq/hemochromatos                          Performed at [place]: Peripheral smear failed to reveal evidence of hypersegmented neutrophils or basophilic stippling.  Urinalysis No results found for this basename: colorurine,  appearanceur,  labspec,  phurine,  glucoseu,  hgbur,  bilirubinur,  ketonesur,  proteinur,  urobilinogen,  nitrite,  leukocytesur    RADIOGRAPHIC STUDIES: US Abdomen Complete W/elastography  10/22/2013   CLINICAL DATA:  Hemochromatosis, thrombocytopenia. Question cirrhosis.  EXAM: ULTRASOUND ABDOMEN  ULTRASOUND HEPATIC ELASTOGRAPHY  TECHNIQUE: Sonography of the upper abdomen was performed, in addition, ultrasound elastography evaluation of the liver was performed. A region of interest was placed within the right lobe of the liver. Following application of a compressive sonographic pulse, shear waves were detected in the adjacent  hepatic tissue and the shear wave velocity was calculated. Multiple assessments were performed at the selected site. Median shear wave velocity is correlated to a Metavir fibrosis score.  COMPARISON:  None.  CORRELATIVE IMAGING: None.  FINDINGS: ULTRASOUND ABDOMEN  Gallbladder:  No gallstones or wall thickening visualized. No sonographic Murphy sign noted.  Common bile duct:  Diameter: Normal caliber, 4 mm.  Liver:  Increased echotexture compatible with fatty infiltration or intrinsic liver disease. No focal abnormality or biliary ductal dilatation.  IVC:  No abnormality visualized.  Pancreas:  Visualized portion unremarkable.  Spleen:  Size and appearance within normal limits.  Right Kidney:  Length: 12.2 cm. Echogenicity within normal limits. No mass or hydronephrosis visualized.  Left Kidney:  Length: 12.3 cm. Echogenicity within normal limits. No mass  or hydronephrosis visualized.  Abdominal aorta:  No aneurysm visualized.  Other findings:  None.  ULTRASOUND HEPATIC ELASTOGRAPHY  Hepatic Segment:  8  Number of measurements:  13  Median velocity:   1.42  m/sec  Corresponding Metavir fibrosis score:  F 1/F 2  Pertinent liver disease findings noted on other imaging exams: None noted or none available  Please note that abnormal shear wave velocities may also be identified in clinical settings other than with hepatic fibrosis, such as: Acute hepatitis, elevated right heart and central venous pressures, veno-occlusive disease (Budd-Chiari), infiltrative processes such as mastocytosis/amyloidosis/infiltrative tumor, extrahepatic cholestasis, in the post-prandial state, and liver transplantation. Correlation with patient history, laboratory data, and clinical condition recommended.  IMPRESSION: ULTRASOUND ABDOMEN: Increased echotexture throughout the liver compatible with fatty infiltration or intrinsic liver disease.  ULTRASOUND HEPATIC ELASTOGRAPHY: Median hepatic shear wave velocity is calculated at 1.42 m/sec, which corresponds to a Metavir fibrosis score of F 1/F 2.   Electronically Signed   By: Rolm Baptise M.D.   On: 10/22/2013 11:08    ASSESSMENT:  #1. Alcoholic hepatitis with associated thrombocytopenia, resolved,  and very little if any progression to cirrhosis by elastography, reversible with abstention from alcohol use. #2. Folic acid deficiency, secondary to alcohol abuse, currently on folic acid supplements. #3. Hypothyroidism, on treatment. #4. No evidence of hemochromatosis as evidenced by normal soluble transferrin receptor and normal iron saturation. Elevated ferritin was probably a reflection of acute liver injury from alcohol, elevated as an acute phase reactant, for repeat value today. #5. Episode of ventricular tachycardia, status post cardioversion, no evidence organic heart disease and subsequent workup, probably a reflection of acute  alcohol intoxication (holiday heart).    PLAN:  #1. Recommend Alcoholics Anonymous in the presence of his wife who accompanied him today. #2. Exclusion of further alcohol use. #3. Followup in 6 months with CBC and ferritin. Continue folic acid 1 mg daily at this time. #4. Influenza virus vaccine was given today.   All questions were answered. The patient knows to call the clinic with any problems, questions or concerns. We can certainly see the patient much sooner if necessary.   I spent 25 minutes counseling the patient face to face. The total time spent in the appointment was 30 minutes.    Doroteo Bradford, MD 11/10/2013 6:25 AM  DISCLAIMER:  This note was dictated with voice recognition software.  Similar sounding words can inadvertently be transcribed inaccurately and may not be corrected upon review.

## 2013-11-09 NOTE — Progress Notes (Signed)
1415:  Christian King presents today for injection per the provider's orders.  Fluvarix administration without incident; see MAR for injection details.  Patient tolerated procedure well and without incident.  No questions or complaints noted at this time.

## 2013-11-10 LAB — FERRITIN: Ferritin: 347 ng/mL — ABNORMAL HIGH (ref 22–322)

## 2013-11-11 ENCOUNTER — Encounter: Payer: Self-pay | Admitting: Internal Medicine

## 2013-11-17 ENCOUNTER — Other Ambulatory Visit: Payer: Self-pay | Admitting: Nurse Practitioner

## 2013-11-18 ENCOUNTER — Ambulatory Visit: Payer: 59 | Admitting: Internal Medicine

## 2013-11-18 NOTE — Telephone Encounter (Signed)
Last filled 10/20/13. [facility] you [facility] 07/2013, [facility]

## 2013-11-20 NOTE — Telephone Encounter (Signed)
Called into pharmacy

## 2013-12-09 ENCOUNTER — Ambulatory Visit: Payer: 59 | Admitting: Internal Medicine

## 2013-12-21 ENCOUNTER — Other Ambulatory Visit: Payer: Self-pay | Admitting: *Deleted

## 2013-12-21 MED ORDER — LORAZEPAM 2 MG PO TABS: ORAL_TABLET | ORAL | Status: DC

## 2013-12-21 NOTE — Telephone Encounter (Signed)
Aware,[facility].

## 2013-12-21 NOTE — Telephone Encounter (Signed)
[facility] you [facility]/2013[facility]

## 2013-12-21 NOTE — Telephone Encounter (Signed)
Please call in ativan with 1 refills 

## 2013-12-22 ENCOUNTER — Ambulatory Visit: Payer: 59 | Admitting: Physician Assistant

## 2013-12-23 ENCOUNTER — Ambulatory Visit: Payer: 59 | Admitting: Physician Assistant

## 2013-12-29 ENCOUNTER — Telehealth: Payer: Self-pay | Admitting: Family Medicine

## 2013-12-29 NOTE — Telephone Encounter (Signed)
[facility]

## 2013-12-31 ENCOUNTER — Ambulatory Visit: Payer: 59 | Admitting: Nurse Practitioner

## 2014-01-11 ENCOUNTER — Encounter: Payer: Self-pay | Admitting: Physician Assistant

## 2014-01-11 ENCOUNTER — Ambulatory Visit (INDEPENDENT_AMBULATORY_CARE_PROVIDER_SITE_OTHER): Payer: 59 | Admitting: Physician Assistant

## 2014-01-11 DIAGNOSIS — I4729 Other ventricular tachycardia: Secondary | ICD-10-CM

## 2014-01-11 DIAGNOSIS — I472 Ventricular tachycardia: Secondary | ICD-10-CM

## 2014-01-11 MED ORDER — METOPROLOL SUCCINATE ER 50 MG PO TB24: 50.0000 mg | ORAL_TABLET | Freq: Every day | ORAL | Status: DC

## 2014-01-11 NOTE — Assessment & Plan Note (Signed)
Patient is on Zocor has had multiple blood tests recently. Last cholesterol in April was 205.

## 2014-01-11 NOTE — Progress Notes (Signed)
HPI: This is a 43 year old male patient of Dr. Johney FrameAllred who has history of ventricular tachycardia secondary to hypokalemia and hypomagnesemia due to alcohol abuse. He was last seen by Dr. Johney FrameAllred in June 2014 at which time is still drinking 2-3 beers a day. He's had an echo/cardiac MRI/GXT nuclear study that were all unrevealing.  Patient comes in here for routine follow-up. He had an episode in May when he was at an outdoor concert drinking alcohol and became overheated. He passed out and was given IV fluids and [facility]. He was told his blood pressure was low and one of his medications was stopped. He denied any palpitations and states that he did not have any arrhythmias. [facility]. He was found to have alcoholic hepatitis with associated thrombocytopenia resolved reversible with Donation from alcohol use. He was also folic acid deficient. There was no evidence of hemochromatosis.  Patient denies any chest pain, palpitations, dyspnea, dyspnea on exertion, dizziness, or presyncope. He does not drink alcohol every day but is having trouble abstaining from. He was gambling last Wednesday and drained quite a bit at the casino. He had wine last night.  Allergies  Allergen Reactions  . Hctz [Hydrochlorothiazide] Rash     Current Outpatient Prescriptions  Medication Sig Dispense Refill  . CIALIS 20 MG tablet TAKE ONE TABLET BY MOUTH AS NEEDED 3 tablet 0  . diphenhydrAMINE (BENADRYL) 25 MG tablet Take 25 mg by mouth every 6 (six) hours as needed (as needed).    Marland Kitchen. doxycycline (VIBRA-TABS) 100 MG tablet Take 1 tablet (100 mg total) by mouth 2 (two) times daily. 20 tablet 0  . fenofibrate 54 MG tablet Take 1 tablet (54 mg total) by mouth daily. 90 tablet 1  . folic acid (FOLVITE) 1 MG tablet Take 1 tablet (1 mg total) by mouth daily. 100 tablet 3  . LORazepam (ATIVAN) 2 MG tablet TAKE ONE TABLET BY MOUTH  ONCE DAILY AT BEDTIME 30 tablet 0  . magnesium oxide (MAG-OX) 400 MG tablet Take 400 mg by mouth daily.    . metoprolol succinate (TOPROL-XL) 50 MG 24 hr tablet Take 1 tablet (50 mg total) by mouth daily. Take with   or immediately following a  meal. 30 tablet 1  . potassium chloride (K-DUR) 10 MEQ tablet Take 1 tablet (10 mEq total) by mouth daily. 90 tablet 3  . potassium chloride (K-DUR,KLOR-CON) 10 MEQ tablet Take 1 tablet by mouth  every day 11 tablet 0  . simvastatin (ZOCOR) 40 MG tablet TAKE ONE TABLET BY MOUTH EVERY DAY 30 tablet 1  . Vitamin D, Ergocalciferol, (DRISDOL) 50000 UNITS CAPS capsule TAKE ONE CAPSULE BY MOUTH ONCE A WEEK 12 capsule 0   Current Facility-Administered Medications  Medication Dose Route Frequency Provider Last Rate Last Dose  . cyanocobalamin ((VITAMIN B-12)) injection 1,000 mcg  1,000 mcg Intramuscular Q30 days Deatra CanterWilliam J Oxford, FNP   1,000 mcg at 09/29/13 1643    Past Medical History  Diagnosis Date  . Ventricular tachycardia     in setting of hypokalemia/ hypomagnesemia and ETOH  . HLD (hyperlipidemia)   . HTN (hypertension)   . B12 deficiency   . Gout   . Hypothyroidism   . Alcohol abuse   . Bronchitis     Past Surgical History  Procedure Laterality Date  . 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  Total hip arthroplasty Right     Family History  Problem Relation Age of Onset  . Hypertension Father   . Diabetes Maternal Grandmother   . Hypertension Paternal Grandmother   . Stroke Paternal Grandmother     History   Social History  . Marital Status: Married    Spouse Name: N/A    Number of Children: N/A  . Years of Education: N/A   Occupational History  . Not on file.   Social History Main Topics  . Smoking status: Former Smoker -- 0.20 packs/day    Quit date: 02/19/2010  . Smokeless tobacco: Not on file  . Alcohol Use: No     Comment: was drinking 6 beers or shots or more a day; 11/09/13 - denies use  x 3 weeks  . Drug Use: No  . Sexual Activity: Yes   Other Topics Concern  . Not on file   Social History Narrative   Unemployed.  Previously a Automotive engineerrealtor and insurance salesman    ROS: She history of present illness otherwise negative  BP 120/82 mmHg  Pulse 60  Ht 5\' 7"  (1.702 m)  Wt 172 lb (78.019 kg)  BMI 26.93 kg/m2  PHYSICAL EXAM: Well-nournished, in no acute distress. Neck: No JVD, HJR, Bruit, or thyroid enlargement  Lungs: No tachypnea, clear without wheezing, rales, or rhonchi  Cardiovascular: RRR, PMI not displaced, Normal S1 and S2, no murmurs, gallops, bruit, thrill, or heave.  Abdomen: BS normal. Soft without organomegaly, masses, lesions or tenderness.  Extremities: without cyanosis, clubbing or edema. Good distal pulses bilateral  SKin: Warm, no lesions or rashes   Musculoskeletal: No deformities  Neuro: no focal signs   Wt Readings from Last 3 Encounters:  11/09/13 167 lb (75.751 kg)  10/19/13 163 lb 6.4 oz (74.118 kg)  09/29/13 167 lb (75.751 kg)    Lab Results  Component Value Date   WBC 6.7 11/09/2013   HGB 13.8 11/09/2013   HCT 40.6 11/09/2013   PLT 277 11/09/2013   GLUCOSE 88 10/19/2013   CHOL 205* 05/26/2013   TRIG 72 05/26/2013   HDL 96 05/26/2013   LDLCALC 95 05/26/2013   ALT 38 10/19/2013   AST 43* 10/19/2013   NA 138 10/19/2013   K 4.0 10/19/2013   CL 98 10/19/2013   CREATININE 0.98 10/19/2013   BUN 12 10/19/2013   CO2 25 10/19/2013   TSH 1.540 09/29/2013    EKG: Normal sinus rhythm, no acute change  MR CARDIA MORPHOLOGY WITHOUT AND WITH CONTRAST 2012   GE 1.5 T magnet with dedicated cardiac coil.  FIESTA sequences for function and viability.  T1 and T1 fat sat images for tissue characterization.  10 minutes after Magnevist contrast was given, inversion recovery sequences were done to assess for myocardial delayed enhancement. EF was calculated at a dedicated workstation.   Comparison: None.   Findings: The left  ventricle was normal in size with low normal systolic function, EF estimated to be 53.5%.  No regional wall motion abnormalities.  The right ventricle appeared normal in size and systolic function.  No RV aneurysms or discrete wall motion abnormalities.  Normal T1 enhancement of the RV wall.  The atria were normal in size.  Trileaflet aortic valve with no stenosis or significant regurgitation.  No significant mitral regurgitation noted.   Delayed enhancement images were poor quality.  However, no definite myocardial delayed enhancement was seen.   IMPRESSION: 1.  Normal LV size with low normal systolic function, EF 53.5%.  No discrete wall motion abnormalities.   2.  Normal RV size and systolic function without evidence for ARVC.   3.  Poor quality delayed enhancement images, but no myocardial delayed enhancement was seen.

## 2014-01-11 NOTE — Patient Instructions (Signed)
Your physician recommends that you continue on your current medications as directed. Please refer to the Current Medication list given to you today.     Your physician wants you to follow-up in:  WITH DR Johney FrameALLRED IN ONE YEAR  You will receive a reminder letter in the mail two months in advance. If you don't receive a letter, please call our office to schedule the follow-up appointment.

## 2014-01-11 NOTE — Assessment & Plan Note (Signed)
BP stable.

## 2014-01-11 NOTE — Assessment & Plan Note (Signed)
Patient has had no recurrence since 2012. He did have syncope in May of this year related to being overheated and dehydrated with alcohol consumption. This treated with IV fluids. Follow-up with Dr. Johney FrameAllred in 1 year.

## 2014-01-11 NOTE — Assessment & Plan Note (Signed)
Patient continues to drink alcohol despite being told by multiple physicians to stop. Alcoholics Anonymous was recommended. Patient is not willing to quit.

## 2014-01-25 ENCOUNTER — Other Ambulatory Visit: Payer: Self-pay | Admitting: Internal Medicine

## 2014-01-26 ENCOUNTER — Other Ambulatory Visit: Payer: Self-pay | Admitting: Nurse Practitioner

## 2014-02-05 ENCOUNTER — Ambulatory Visit (INDEPENDENT_AMBULATORY_CARE_PROVIDER_SITE_OTHER): Payer: 59 | Admitting: Nurse Practitioner

## 2014-02-05 ENCOUNTER — Encounter: Payer: Self-pay | Admitting: Nurse Practitioner

## 2014-02-05 VITALS — BP 151/99 | HR 94 | Temp 97.6°F | Ht 67.0 in | Wt 177.0 lb

## 2014-02-05 DIAGNOSIS — F1010 Alcohol abuse, uncomplicated: Secondary | ICD-10-CM

## 2014-02-05 DIAGNOSIS — E039 Hypothyroidism, unspecified: Secondary | ICD-10-CM

## 2014-02-05 DIAGNOSIS — G4700 Insomnia, unspecified: Secondary | ICD-10-CM | POA: Insufficient documentation

## 2014-02-05 DIAGNOSIS — M1A9XX0 Chronic gout, unspecified, without tophus (tophi): Secondary | ICD-10-CM

## 2014-02-05 DIAGNOSIS — Z96649 Presence of unspecified artificial hip joint: Secondary | ICD-10-CM

## 2014-02-05 DIAGNOSIS — E785 Hyperlipidemia, unspecified: Secondary | ICD-10-CM

## 2014-02-05 DIAGNOSIS — I10 Essential (primary) hypertension: Secondary | ICD-10-CM

## 2014-02-05 DIAGNOSIS — E876 Hypokalemia: Secondary | ICD-10-CM

## 2014-02-05 HISTORY — DX: Insomnia, unspecified: G47.00

## 2014-02-05 MED ORDER — MAGNESIUM OXIDE 400 MG PO TABS: 400.0000 mg | ORAL_TABLET | Freq: Every day | ORAL | Status: DC

## 2014-02-05 MED ORDER — METOPROLOL SUCCINATE ER 50 MG PO TB24: 50.0000 mg | ORAL_TABLET | Freq: Every day | ORAL | Status: DC

## 2014-02-05 MED ORDER — LORAZEPAM 2 MG PO TABS: ORAL_TABLET | ORAL | Status: DC

## 2014-02-05 MED ORDER — POTASSIUM CHLORIDE CRYS ER 10 MEQ PO TBCR: EXTENDED_RELEASE_TABLET | ORAL | Status: DC

## 2014-02-05 MED ORDER — SIMVASTATIN 40 MG PO TABS: 40.0000 mg | ORAL_TABLET | Freq: Every day | ORAL | Status: DC

## 2014-02-05 MED ORDER — FENOFIBRATE 54 MG PO TABS: ORAL_TABLET | ORAL | Status: DC

## 2014-02-05 NOTE — Addendum Note (Signed)
Addended by: Bennie PieriniMARTIN, MARY-MARGARET on: 02/05/2014 08:31 AM   Modules accepted: Orders

## 2014-02-05 NOTE — Progress Notes (Signed)
Subjective:    Patient ID: Christian King, male    DOB: 17-Aug-1970, 43 y.o.   MRN: [id number]  Patient here today for follow up  Of chronic medical problems.  Hypertension This is a chronic problem. The current episode started more than 1 year ago. The problem is uncontrolled. Pertinent negatives include no headaches, palpitations, peripheral edema or shortness of breath. Risk factors for coronary artery disease include dyslipidemia and male gender. The current treatment provides mild improvement. Compliance problems include diet and exercise.   Hyperlipidemia This is a chronic problem. The current episode started more than 1 year ago. The problem is uncontrolled. Recent lipid tests were reviewed and are variable. Exacerbating diseases include obesity. He has no history of diabetes or hypothyroidism. Pertinent negatives include no myalgias or shortness of breath. Current antihyperlipidemic treatment includes statins and fibric acid derivatives. The current treatment provides moderate improvement of lipids. Compliance problems include adherence to diet and adherence to exercise.  Risk factors for coronary artery disease include dyslipidemia, hypertension and male sex.  Insomnia Pt takes 1/2 ativan about 3-4 nights a weeks- Works well with no complaints  Alcohol abuse Patient says that he drinks daily- sometimes beer sometimes liquor. Has no desire to stop. Hypokalemia Potassium supplement daily- no cramps. Hemochromatosis Patient says he is feeling okay right now- no fatigue. NOt 100% sure is this is correct diagnosis according to hematologist- has follow in March.    Review of Systems  Constitutional: Negative.   HENT: Negative.   Respiratory: Negative for shortness of breath.   Cardiovascular: Negative for palpitations.  Musculoskeletal: Negative for myalgias.  Neurological: Negative for headaches.  All other systems reviewed and are negative.      Objective:   Physical Exam   Constitutional: He is oriented to person, place, and time. He appears well-developed and well-nourished.  HENT:  Head: Normocephalic.  Right Ear: External ear normal.  Left Ear: External ear normal.  Nose: Nose normal.  Mouth/Throat: Oropharynx is clear and moist.  Eyes: EOM are normal. Pupils are equal, round, and reactive to light.  Neck: Normal range of motion. Neck supple. No JVD present. No thyromegaly present.  Cardiovascular: Normal rate, regular rhythm, normal heart sounds and intact distal pulses.  Exam reveals no gallop and no friction rub.   No murmur heard. Pulmonary/Chest: Effort normal and breath sounds normal. No respiratory distress. He has no wheezes. He has no rales. He exhibits no tenderness.  Abdominal: Soft. Bowel sounds are normal. He exhibits no mass. There is no tenderness.  Genitourinary: Prostate normal and penis normal.  Musculoskeletal: Normal range of motion. He exhibits no edema.  Lymphadenopathy:    He has no cervical adenopathy.  Neurological: He is alert and oriented to person, place, and time. No cranial nerve deficit.  Skin: Skin is warm and dry.  Psychiatric: He has a normal mood and affect. His behavior is normal. Judgment and thought content normal.     BP 151/99 mmHg  Pulse 94  Temp(Src) 97.6 F (36.4 C) (Oral)  Ht _0  (1.702 m)  Wt 177 lb (80.287 kg)  BMI 27.72 kg/m2      Assessment & Plan:   1. Insomnia bedtime ritual  2. Essential hypertension Do not add salt to diet - CMP14+EGFR - metoprolol succinate (TOPROL-XL) 50 MG 24 hr tablet; Take 1 tablet (50 mg total) by mouth daily. Take with or immediately following a meal.  Dispense: 90 tablet; Refill: 3  3. Hypothyroidism, unspecified hypothyroidism type  4.  Alcohol abuse  5. Chronic gout without tophus, unspecified cause, unspecified site  6. Hemochromatosis Keep follow up with hematologist - Anemia Profile B  7. Hyperlipidemia Avoid fat in diet - NMR, lipoprofile -  fenofibrate 54 MG tablet; Take 1 tablet by mouth  daily  Dispense: 90 tablet; Refill: 1 - simvastatin (ZOCOR) 40 MG tablet; Take 1 tablet (40 mg total) by mouth daily.  Dispense: 90 tablet; Refill: 1  8. Hypokalemia - potassium chloride (K-DUR,KLOR-CON) 10 MEQ tablet; Take 1 tablet by mouth  every day  Dispense: 90 tablet; Refill: 1    Labs pending Health maintenance reviewed Diet and exercise encouraged Continue all meds Follow up  In 3 months   [place], FNP

## 2014-02-05 NOTE — Patient Instructions (Signed)

## 2014-02-05 NOTE — Addendum Note (Signed)
Addended by: Bennie PieriniMARTIN, MARY-MARGARET on: 02/05/2014 08:34 AM   Modules accepted: Orders

## 2014-02-05 NOTE — Addendum Note (Signed)
Addended by: Bennie PieriniMARTIN, MARY-MARGARET on: 02/05/2014 08:33 AM   Modules accepted: Orders

## 2014-02-08 LAB — NMR, LIPOPROFILE
CHOLESTEROL: 159 mg/dL (ref 100–199)
HDL Cholesterol by NMR: 52 mg/dL (ref >39)
HDL PARTICLE NUMBER: 44.6 umol/L (ref >=30.5)
LDL Particle Number: 1247 nmol/L — ABNORMAL HIGH (ref <1000)
LDL SIZE: 20.4 nm (ref >20.5)
LDL-C: 89 mg/dL (ref 0–99)
LP-IR Score: 75 — ABNORMAL HIGH (ref <=45)
SMALL LDL PARTICLE NUMBER: 672 nmol/L — AB (ref <=527)
TRIGLYCERIDES BY NMR: 90 mg/dL (ref 0–149)

## 2014-02-08 LAB — CMP14+EGFR
A/G RATIO: 1.6 (ref 1.1–2.5)
ALBUMIN: 4.5 g/dL (ref 3.5–5.5)
ALT: 20 IU/L (ref 0–44)
AST: 20 IU/L (ref 0–40)
Alkaline Phosphatase: 68 IU/L (ref 39–117)
BILIRUBIN TOTAL: 0.2 mg/dL (ref 0.0–1.2)
BUN/Creatinine Ratio: 8 — ABNORMAL LOW (ref 9–20)
BUN: 8 mg/dL (ref 6–24)
CO2: 23 mmol/L (ref 18–29)
Calcium: 9.8 mg/dL (ref 8.7–10.2)
Chloride: 101 mmol/L (ref 97–108)
Creatinine, Ser: 1.02 mg/dL (ref 0.76–1.27)
GFR, EST AFRICAN AMERICAN: 102 mL/min/{1.73_m2} (ref >59)
GFR, EST NON AFRICAN AMERICAN: 88 mL/min/{1.73_m2} (ref >59)
Globulin, Total: 2.8 g/dL (ref 1.5–4.5)
Glucose: 117 mg/dL — ABNORMAL HIGH (ref 65–99)
Potassium: 4.5 mmol/L (ref 3.5–5.2)
Sodium: 141 mmol/L (ref 134–144)
Total Protein: 7.3 g/dL (ref 6.0–8.5)

## 2014-02-08 LAB — ANEMIA PROFILE B
BASOS: 1 %
Basophils Absolute: 0.1 10*3/uL (ref 0.0–0.2)
EOS ABS: 0.1 10*3/uL (ref 0.0–0.4)
EOS: 2 %
FOLATE: 13 ng/mL (ref >3.0)
Ferritin: 181 ng/mL (ref 30–400)
HCT: 44.9 % (ref 37.5–51.0)
HEMOGLOBIN: 14.9 g/dL (ref 12.6–17.7)
Immature Grans (Abs): 0 10*3/uL (ref 0.0–0.1)
Immature Granulocytes: 0 %
Iron Saturation: 16 % (ref 15–55)
Iron: 73 ug/dL (ref 40–155)
LYMPHS: 28 %
Lymphocytes Absolute: 1.7 10*3/uL (ref 0.7–3.1)
MCH: 32.5 pg (ref 26.6–33.0)
MCHC: 33.2 g/dL (ref 31.5–35.7)
MCV: 98 fL — ABNORMAL HIGH (ref 79–97)
Monocytes Absolute: 0.8 10*3/uL (ref 0.1–0.9)
Monocytes: 13 %
Neutrophils Absolute: 3.5 10*3/uL (ref 1.4–7.0)
Neutrophils Relative %: 56 %
Platelets: 296 10*3/uL (ref 150–379)
RBC: 4.59 x10E6/uL (ref 4.14–5.80)
RDW: 13.1 % (ref 12.3–15.4)
Retic Ct Pct: 1.4 % (ref 0.6–2.6)
TIBC: 449 ug/dL (ref 250–450)
UIBC: 376 ug/dL — ABNORMAL HIGH (ref 150–375)
VITAMIN B 12: 163 pg/mL — AB (ref 211–946)
WBC: 6.1 10*3/uL (ref 3.4–10.8)

## 2014-02-08 LAB — MAGNESIUM, RBC: Magnesium RBC: 5.7 mg/dL (ref 4.2–6.8)

## 2014-02-08 LAB — CHROMIUM AND COBALT, WB (MOM)
COBALT: 1.1 ng/mL (ref <3.0)
Chromium: 1.6 ng/mL (ref <3.0)

## 2014-02-10 ENCOUNTER — Telehealth: Payer: Self-pay | Admitting: *Deleted

## 2014-02-10 NOTE — Telephone Encounter (Signed)
Aware of lab results  

## 2014-03-29 ENCOUNTER — Other Ambulatory Visit: Payer: Self-pay | Admitting: Nurse Practitioner

## 2014-04-05 ENCOUNTER — Other Ambulatory Visit: Payer: Self-pay | Admitting: Nurse Practitioner

## 2014-04-07 ENCOUNTER — Other Ambulatory Visit: Payer: Self-pay

## 2014-04-07 NOTE — Telephone Encounter (Signed)
Last seen 02/05/14 MMM  Last Vit D level 09/29/13  37.7 normal

## 2014-04-08 MED ORDER — VITAMIN D (ERGOCALCIFEROL) 1.25 MG (50000 UNIT) PO CAPS: ORAL_CAPSULE | ORAL | Status: DC

## 2014-04-27 ENCOUNTER — Other Ambulatory Visit: Payer: Self-pay | Admitting: Nurse Practitioner

## 2014-04-28 NOTE — Telephone Encounter (Signed)
Last seen 02/05/14 MMM IF approved route to nurse to call into [facility]

## 2014-04-28 NOTE — Telephone Encounter (Signed)
RX for Lorazepam called into [facility] okayed per MMM

## 2014-04-28 NOTE — Telephone Encounter (Signed)
Please call in ativan with 1 refills 

## 2014-05-10 ENCOUNTER — Other Ambulatory Visit (HOSPITAL_COMMUNITY): Payer: 59

## 2014-05-10 ENCOUNTER — Ambulatory Visit (HOSPITAL_COMMUNITY): Payer: Self-pay | Admitting: Oncology

## 2014-05-25 ENCOUNTER — Other Ambulatory Visit (HOSPITAL_COMMUNITY): Payer: Self-pay

## 2014-05-31 ENCOUNTER — Other Ambulatory Visit (HOSPITAL_COMMUNITY): Payer: Self-pay

## 2014-05-31 ENCOUNTER — Ambulatory Visit (HOSPITAL_COMMUNITY): Payer: Self-pay | Admitting: Oncology

## 2014-06-14 ENCOUNTER — Other Ambulatory Visit: Payer: Self-pay

## 2014-06-14 DIAGNOSIS — I10 Essential (primary) hypertension: Secondary | ICD-10-CM

## 2014-06-14 MED ORDER — METOPROLOL SUCCINATE ER 50 MG PO TB24: 50.0000 mg | ORAL_TABLET | Freq: Every day | ORAL | Status: DC

## 2014-06-28 ENCOUNTER — Other Ambulatory Visit: Payer: Self-pay | Admitting: Nurse Practitioner

## 2014-06-29 NOTE — Telephone Encounter (Signed)
Last seen 02/05/14 MMM If approved route to nurse to call into  [facility]

## 2014-06-29 NOTE — Telephone Encounter (Signed)
Please call in ativan with 0 refills no more refills without being seen  

## 2014-06-30 ENCOUNTER — Other Ambulatory Visit (HOSPITAL_COMMUNITY): Payer: Self-pay

## 2014-06-30 ENCOUNTER — Ambulatory Visit (HOSPITAL_COMMUNITY): Payer: Self-pay | Admitting: Oncology

## 2014-06-30 ENCOUNTER — Other Ambulatory Visit: Payer: Self-pay | Admitting: Nurse Practitioner

## 2014-06-30 NOTE — Telephone Encounter (Signed)
rx called into pharmacy

## 2014-07-06 ENCOUNTER — Other Ambulatory Visit (HOSPITAL_COMMUNITY): Payer: Self-pay

## 2014-07-12 ENCOUNTER — Ambulatory Visit (HOSPITAL_COMMUNITY): Payer: Self-pay | Admitting: Oncology

## 2014-07-12 ENCOUNTER — Other Ambulatory Visit: Payer: Self-pay | Admitting: Nurse Practitioner

## 2014-07-12 ENCOUNTER — Other Ambulatory Visit (HOSPITAL_COMMUNITY): Payer: Self-pay

## 2014-07-21 ENCOUNTER — Ambulatory Visit (HOSPITAL_COMMUNITY): Payer: Self-pay | Admitting: Oncology

## 2014-07-21 ENCOUNTER — Other Ambulatory Visit (HOSPITAL_COMMUNITY): Payer: Self-pay

## 2014-08-02 ENCOUNTER — Other Ambulatory Visit: Payer: Self-pay | Admitting: Nurse Practitioner

## 2014-08-02 NOTE — Telephone Encounter (Signed)
Patient last seen in office on 12-18. Rx last filled on 5-11 for #30. Please advise.

## 2014-08-02 NOTE — Telephone Encounter (Signed)
Refill called to [facility] 

## 2014-08-10 ENCOUNTER — Ambulatory Visit (HOSPITAL_COMMUNITY): Payer: Self-pay | Admitting: Oncology

## 2014-08-10 ENCOUNTER — Other Ambulatory Visit (HOSPITAL_COMMUNITY): Payer: Self-pay

## 2014-08-24 ENCOUNTER — Other Ambulatory Visit: Payer: Self-pay | Admitting: Nurse Practitioner

## 2014-08-24 ENCOUNTER — Other Ambulatory Visit: Payer: Self-pay | Admitting: Family Medicine

## 2014-08-24 NOTE — Telephone Encounter (Signed)
Last seen 02/05/14 MMM  Last VIT D 09/29/13  37.7

## 2014-08-24 NOTE — Telephone Encounter (Signed)
no more refills without being seen  

## 2014-08-24 NOTE — Telephone Encounter (Signed)
Last seen and last lipid 02/05/14  MMM Requesting 90 day supply

## 2014-08-25 ENCOUNTER — Ambulatory Visit (HOSPITAL_COMMUNITY): Payer: Self-pay | Admitting: Oncology

## 2014-08-25 ENCOUNTER — Other Ambulatory Visit (HOSPITAL_COMMUNITY): Payer: Self-pay

## 2014-08-27 ENCOUNTER — Encounter: Payer: Self-pay | Admitting: Family Medicine

## 2014-08-27 ENCOUNTER — Ambulatory Visit (INDEPENDENT_AMBULATORY_CARE_PROVIDER_SITE_OTHER): Payer: 59 | Admitting: Family Medicine

## 2014-08-27 VITALS — BP 133/91 | HR 82 | Temp 97.0°F | Ht 67.0 in | Wt 178.0 lb

## 2014-08-27 DIAGNOSIS — L259 Unspecified contact dermatitis, unspecified cause: Secondary | ICD-10-CM

## 2014-08-27 MED ORDER — BETAMETHASONE SOD PHOS & ACET 6 (3-3) MG/ML IJ SUSP
6.0000 mg | Freq: Once | INTRAMUSCULAR | Status: AC
  Administered 2014-08-27: 6 mg via INTRAMUSCULAR

## 2014-08-27 MED ORDER — PREDNISONE 10 MG (21) PO TBPK: ORAL_TABLET | ORAL | Status: DC

## 2014-08-27 NOTE — Progress Notes (Signed)
Subjective:  Patient ID: Christian King, male    DOB: March 24, 1970  Age: 44 y.o. MRN: [id number]  CC: Rash   HPI GODRIC LAVELL presents for recurrent eruption on the body of presumptive poison ivy. Very pruritic present for several days and increasing. Exposure apparently in his yard lawnmowing and weed eating. No systemic symptoms such as chest pain and shortness of breath wheezing tremor are noted  History [name] has a past medical history of Ventricular tachycardia; HLD (hyperlipidemia); HTN (hypertension); B12 deficiency; Gout; Hypothyroidism; Alcohol abuse; and Bronchitis.   He has past surgical history that includes vascularized fibular graft and Total hip arthroplasty (Right).   His family history includes Diabetes in his maternal grandmother; Hypertension in his father and paternal grandmother; Stroke in his paternal grandmother.He reports that he quit smoking about 4 years ago. He does not have any smokeless tobacco history on file. He reports that he does not drink alcohol or use illicit drugs.  Outpatient Prescriptions Prior to Visit  Medication Sig Dispense Refill  . CIALIS 20 MG tablet TAKE ONE TABLET BY MOUTH AS NEEDED 3 tablet 0  . diphenhydrAMINE (BENADRYL) 25 MG tablet Take 25 mg by mouth every 6 (six) hours as needed (as needed).    . fenofibrate 54 MG tablet Take 1 tablet by mouth  daily  Rx written by: Paulene Floor, NP 90 tablet 0  . folic acid (FOLVITE) 1 MG tablet Take 1 tablet (1 mg total) by mouth daily. 100 tablet 3  . LORazepam (ATIVAN) 2 MG tablet TAKE ONE TABLET BY MOUTH AT BEDTIME MUST  BE  SEEN  FOR  ADDITIONAL  REFILLS 30 tablet 0  . magnesium oxide (MAG-OX) 400 MG tablet Take 1 tablet (400 mg total) by mouth daily. 90 tablet 1  . metoprolol succinate (TOPROL-XL) 50 MG 24 hr tablet Take 1 tablet (50 mg total) by mouth daily. Take with or immediately following a meal. 90 tablet 1  . potassium chloride (K-DUR,KLOR-CON) 10 MEQ tablet Take 1 tablet by  mouth  every day 90 tablet 1  . simvastatin (ZOCOR) 40 MG tablet Take 1 tablet (40 mg total) by mouth daily. 90 tablet 1  . simvastatin (ZOCOR) 40 MG tablet TAKE ONE TABLET BY MOUTH ONCE DAILY 30 tablet 3  . Vitamin D, Ergocalciferol, (DRISDOL) 50000 UNITS CAPS capsule TAKE ONE CAPSULE BY MOUTH  ONCE A WEEK 12 capsule 0   No facility-administered medications prior to visit.    ROS Review of Systems  Constitutional: Negative for fever, chills and diaphoresis.  HENT: Negative for congestion, rhinorrhea and sore throat.   Respiratory: Negative for cough, shortness of breath and wheezing.   Cardiovascular: Negative for chest pain.  Gastrointestinal: Negative for nausea, vomiting, abdominal pain, diarrhea, constipation and abdominal distention.  Genitourinary: Negative for dysuria and frequency.  Musculoskeletal: Negative for joint swelling and arthralgias.  Skin: Negative for rash.  Neurological: Negative for headaches.    Objective:  BP 133/91 mmHg  Pulse 82  Temp(Src) 97 F (36.1 C) (Oral)  Ht  (1.702 m)  Wt 178 lb (80.74 kg)  BMI 27.87 kg/m2  BP Readings from Last 3 Encounters:  08/27/14 133/91  02/05/14 151/99  01/11/14 120/82    Wt Readings from Last 3 Encounters:  08/27/14 178 lb (80.74 kg)  02/05/14 177 lb (80.287 kg)  01/11/14 172 lb (78.019 kg)     Physical Exam  Constitutional: He appears well-developed and well-nourished.  HENT:  Head: Normocephalic and atraumatic.  Right  Ear: External ear normal.  Left Ear: External ear normal.  Mouth/Throat: No oropharyngeal exudate or posterior oropharyngeal erythema.  Eyes: Pupils are equal, round, and reactive to light.  Neck: Normal range of motion. Neck supple.  Cardiovascular: Normal rate and regular rhythm.   No murmur heard. Pulmonary/Chest: Breath sounds normal. No respiratory distress.  Abdominal: Bowel sounds are normal.  Skin: There is erythema.  Moderate papulovesicular eruption at the right ilial  crest. Moderate vesicular eruption at both forearms. Multiple raised vesicles at the buttocks superiorly and bilaterally. Small amount on both calves and thighs.  Vitals reviewed.   No results found for: HGBA1C  Lab Results  Component Value Date   WBC 6.1 02/05/2014   HGB 14.9 02/05/2014   HCT 44.9 02/05/2014   PLT 296 02/05/2014   GLUCOSE 117* 02/05/2014   CHOL 159 02/05/2014   TRIG 90 02/05/2014   HDL 52 02/05/2014   LDLCALC 95 05/26/2013   ALT 20 02/05/2014   AST 20 02/05/2014   NA 141 02/05/2014   K 4.5 02/05/2014   CL 101 02/05/2014   CREATININE 1.02 02/05/2014   BUN 8 02/05/2014   CO2 23 02/05/2014   TSH 1.540 09/29/2013    US Abdomen Complete W/elastography  10/22/2013   CLINICAL DATA:  Hemochromatosis, thrombocytopenia. Question cirrhosis.  EXAM: ULTRASOUND ABDOMEN  ULTRASOUND HEPATIC ELASTOGRAPHY  TECHNIQUE: Sonography of the upper abdomen was performed, in addition, ultrasound elastography evaluation of the liver was performed. A region of interest was placed within the right lobe of the liver. Following application of a compressive sonographic pulse, shear waves were detected in the adjacent hepatic tissue and the shear wave velocity was calculated. Multiple assessments were performed at the selected site. Median shear wave velocity is correlated to a Metavir fibrosis score.  COMPARISON:  None.  CORRELATIVE IMAGING: None.  FINDINGS: ULTRASOUND ABDOMEN  Gallbladder:  No gallstones or wall thickening visualized. No sonographic Murphy sign noted.  Common bile duct:  Diameter: Normal caliber, 4 mm.  Liver:  Increased echotexture compatible with fatty infiltration or intrinsic liver disease. No focal abnormality or biliary ductal dilatation.  IVC:  No abnormality visualized.  Pancreas:  Visualized portion unremarkable.  Spleen:  Size and appearance within normal limits.  Right Kidney:  Length: 12.2 cm. Echogenicity within normal limits. No mass or hydronephrosis visualized.  Left  Kidney:  Length: 12.3 cm. Echogenicity within normal limits. No mass or hydronephrosis visualized.  Abdominal aorta:  No aneurysm visualized.  Other findings:  None.  ULTRASOUND HEPATIC ELASTOGRAPHY  Hepatic Segment:  8  Number of measurements:  13  Median velocity:   1.42  m/sec  Corresponding Metavir fibrosis score:  F 1/F 2  Pertinent liver disease findings noted on other imaging exams: None noted or none available  Please note that abnormal shear wave velocities may also be identified in clinical settings other than with hepatic fibrosis, such as: Acute hepatitis, elevated right heart and central venous pressures, veno-occlusive disease (Budd-Chiari), infiltrative processes such as mastocytosis/amyloidosis/infiltrative tumor, extrahepatic cholestasis, in the post-prandial state, and liver transplantation. Correlation with patient history, laboratory data, and clinical condition recommended.  IMPRESSION: ULTRASOUND ABDOMEN: Increased echotexture throughout the liver compatible with fatty infiltration or intrinsic liver disease.  ULTRASOUND HEPATIC ELASTOGRAPHY: Median hepatic shear wave velocity is calculated at 1.42 m/sec, which corresponds to a Metavir fibrosis score of F 1/F 2.   Electronically Signed   By: Charlett Nose M.D.   On: 10/22/2013 11:08    Assessment & Plan:  Casimiro NeedleMichael was seen today for rash.  Diagnoses and all orders for this visit:  Contact dermatitis Orders: -     betamethasone acetate-betamethasone sodium phosphate (CELESTONE) injection 6 mg; Inject 1 mL (6 mg total) into the muscle once.  Other orders -     predniSONE (STERAPRED UNI-PAK 21 TAB) 10 MG (21) TBPK tablet; Take 6 on day 1 then one less each day until done   I am having Mr. Mayford KnifeWilliams start on predniSONE. I am also having him maintain his diphenhydrAMINE, folic acid, magnesium oxide, potassium chloride, simvastatin, simvastatin, metoprolol succinate, CIALIS, LORazepam, fenofibrate, and Vitamin D (Ergocalciferol). We  administered betamethasone acetate-betamethasone sodium phosphate.  Meds ordered this encounter  Medications  . betamethasone acetate-betamethasone sodium phosphate (CELESTONE) injection 6 mg    Sig:   . predniSONE (STERAPRED UNI-PAK 21 TAB) 10 MG (21) TBPK tablet    Sig: Take 6 on day 1 then one less each day until done    Dispense:  21 tablet    Refill:  0     Follow-up: Return if symptoms worsen or fail to improve.  Mechele ClaudeWarren Emmalia Heyboer, M.D.

## 2014-08-31 ENCOUNTER — Other Ambulatory Visit: Payer: Self-pay | Admitting: Family

## 2014-08-31 NOTE — Telephone Encounter (Signed)
Last seen 08/27/14  Dr Darlyn ReadStacks  [facility]

## 2014-08-31 NOTE — Telephone Encounter (Signed)
Please review and advise.

## 2014-09-01 NOTE — Telephone Encounter (Signed)
Please call in ativan with 0 refills  Patient NTBS for follow up and lab work  

## 2014-09-01 NOTE — Telephone Encounter (Signed)
Refill called to [facility] 

## 2014-09-02 ENCOUNTER — Other Ambulatory Visit: Payer: Self-pay | Admitting: Family

## 2014-09-03 ENCOUNTER — Telehealth: Payer: Self-pay | Admitting: Nurse Practitioner

## 2014-09-03 NOTE — Telephone Encounter (Signed)
[facility] at [facility], with no refills

## 2014-09-03 NOTE — Telephone Encounter (Signed)
Last filled 08/02/14, last seen 01/2014[facility]

## 2014-09-03 NOTE — Telephone Encounter (Signed)
Please call in ativan with 0 refills no more refills without being seen  

## 2014-09-03 NOTE — Telephone Encounter (Signed)
Rx called in to [facility]

## 2014-09-06 ENCOUNTER — Telehealth: Payer: Self-pay | Admitting: Family Medicine

## 2014-09-06 MED ORDER — PREDNISONE 10 MG PO TABS: ORAL_TABLET | ORAL | Status: DC

## 2014-09-06 NOTE — Telephone Encounter (Signed)
Stronger taper of prednisone sent in

## 2014-09-06 NOTE — Telephone Encounter (Signed)
Detailed message left for patient that rx for prednisone sent to pharmacy. Ok per [employer]

## 2014-09-22 ENCOUNTER — Other Ambulatory Visit: Payer: Self-pay | Admitting: Internal Medicine

## 2014-10-04 ENCOUNTER — Other Ambulatory Visit: Payer: Self-pay | Admitting: Nurse Practitioner

## 2014-10-04 NOTE — Telephone Encounter (Signed)
Patient last seen in office on 08-27-14. Rx last filled on 7-15. Please advise

## 2014-10-05 NOTE — Telephone Encounter (Signed)
Please call in lorazepam with 1 refills 

## 2014-10-05 NOTE — Telephone Encounter (Signed)
rx called into pharmacy

## 2014-10-17 ENCOUNTER — Other Ambulatory Visit: Payer: Self-pay | Admitting: Nurse Practitioner

## 2014-11-03 ENCOUNTER — Other Ambulatory Visit: Payer: Self-pay | Admitting: Nurse Practitioner

## 2014-11-03 NOTE — Telephone Encounter (Signed)
Last seen 08/27/14  Dr Darlyn Read   Last lipid 02/05/14  Last Vit D 09/29/13  37.7

## 2014-12-03 ENCOUNTER — Other Ambulatory Visit: Payer: Self-pay | Admitting: Nurse Practitioner

## 2014-12-03 NOTE — Telephone Encounter (Signed)
Last filled 11/04/14, last seen 01/2014. [facility]

## 2014-12-03 NOTE — Telephone Encounter (Signed)
Please call in loraepam with 1 refills

## 2014-12-03 NOTE — Telephone Encounter (Signed)
Rx called into [facility] Okayed per MMM

## 2014-12-23 ENCOUNTER — Other Ambulatory Visit: Payer: Self-pay | Admitting: Internal Medicine

## 2014-12-23 ENCOUNTER — Other Ambulatory Visit: Payer: Self-pay | Admitting: Family Medicine

## 2015-01-14 ENCOUNTER — Other Ambulatory Visit: Payer: Self-pay | Admitting: Nurse Practitioner

## 2015-01-14 NOTE — Telephone Encounter (Signed)
Last seen 09/16/14  Dr Darlyn ReadStacks  If approved print

## 2015-01-21 ENCOUNTER — Ambulatory Visit: Payer: 59 | Admitting: Nurse Practitioner

## 2015-01-26 ENCOUNTER — Ambulatory Visit (INDEPENDENT_AMBULATORY_CARE_PROVIDER_SITE_OTHER): Payer: 59 | Admitting: Internal Medicine

## 2015-01-26 ENCOUNTER — Encounter: Payer: Self-pay | Admitting: Internal Medicine

## 2015-01-26 VITALS — BP 122/84 | HR 98 | Ht 68.0 in | Wt 166.6 lb

## 2015-01-26 DIAGNOSIS — F1010 Alcohol abuse, uncomplicated: Secondary | ICD-10-CM

## 2015-01-26 DIAGNOSIS — I472 Ventricular tachycardia: Secondary | ICD-10-CM | POA: Diagnosis not present

## 2015-01-26 DIAGNOSIS — I4729 Other ventricular tachycardia: Secondary | ICD-10-CM

## 2015-01-26 NOTE — Patient Instructions (Signed)
Medication Instructions:  Your physician recommends that you continue on your current medications as directed. Please refer to the Current Medication list given to you today.   Labwork: None ordered   Testing/Procedures: None ordered   Follow-Up: Your physician wants you to follow-up in: 12 months with Renee Ursuy, PA You will receive a reminder letter in the mail two months in advance. If you don't receive a letter, please call our office to schedule the follow-up appointment.       Any Other Special Instructions Will Be Listed Below (If Applicable).     If you need a refill on your cardiac medications before your next appointment, please call your pharmacy.   

## 2015-01-26 NOTE — Progress Notes (Signed)
PCP: Bennie PieriniMARTIN,MARY MARGARET, FNP  The patient presents today for routine electrophysiology followup.  Since last being seen in our clinic, the patient reports doing very well.   Unfortunately, he has returned to alcohol.  This has resulted in separation from his spouse.  He is quite stressed out about this.  He has had no further VT for several years.  Tolerating metoprolol.   Today, he denies symptoms of palpitations, chest pain, shortness of breath, orthopnea, PND, lower extremity edema, presyncope, syncope, or neurologic sequela.  The patient feels that he is tolerating medications without difficulties and is otherwise without complaint today.   Past Medical History  Diagnosis Date  . Ventricular tachycardia (HCC)     in setting of hypokalemia/ hypomagnesemia and ETOH  . HLD (hyperlipidemia)   . HTN (hypertension)   . B12 deficiency   . Gout   . Hypothyroidism   . Alcohol abuse   . Bronchitis    Past Surgical History  Procedure Laterality Date  . 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 Total hip arthroplasty Right     Current Outpatient Prescriptions  Medication Sig Dispense Refill  . CIALIS 20 MG tablet TAKE ONE TABLET BY MOUTH AS NEEDED 3 tablet 2  . diphenhydrAMINE (BENADRYL) 25 MG tablet Take 25 mg by mouth every 6 (six) hours as needed for allergies.     . fenofibrate 54 MG tablet Take 1 tablet by mouth  daily 90 tablet 0  . folic acid (FOLVITE) 1 MG tablet Take 1 tablet (1 mg total) by mouth daily. 100 tablet 3  . LORazepam (ATIVAN) 2 MG tablet TAKE ONE TABLET BY MOUTH ONCE DAILY AT BEDTIME 30 tablet 0  . magnesium oxide (MAG-OX) 400 MG tablet Take 1 tablet (400 mg total) by mouth daily. 90 tablet 1  . metoprolol succinate (TOPROL-XL) 50 MG 24 hr tablet Take 1 tablet by mouth  daily; with, or immediately following a meal 90 tablet 0  . potassium chloride (K-DUR,KLOR-CON) 10 MEQ tablet Take 1 tablet by mouth  every day 90 tablet 1  . simvastatin  (ZOCOR) 40 MG tablet Take 1 tablet (40 mg total) by mouth daily. 90 tablet 1  . Vitamin D, Ergocalciferol, (DRISDOL) 50000 UNITS CAPS capsule Take 1 capsule by mouth  once a week 12 capsule 0   No current facility-administered medications for this visit.    Allergies  Allergen Reactions  . Hctz [Hydrochlorothiazide] Rash    Social History   Social History  . Marital Status: Married    Spouse Name: N/A  . Number of Children: N/A  . Years of Education: N/A   Occupational History  . Not on file.   Social History Main Topics  . Smoking status: Former Smoker -- 0.20 packs/day    Quit date: 02/19/2010  . Smokeless tobacco: Not on file  . Alcohol Use: No     Comment: was drinking 6 beers or shots or more a day; 11/09/13 - denies use x 3 weeks  . Drug Use: No  . Sexual Activity: Yes   Other Topics Concern  . Not on file   Social History Narrative   Unemployed.  Previously a Automotive engineerrealtor and insurance salesman   Physical Exam: Filed Vitals:   01/26/15 1408  BP: 122/84  Pulse: 98  Height: 5\' 8"  (1.727 m)  Weight: 166 lb 9.6 oz (75.569 kg)    GEN- The patient is well appearing, alert and oriented x 3  today.   Head- normocephalic, atraumatic Eyes-  Sclera clear, conjunctiva pink Ears- hearing intact Oropharynx- clear Neck- supple, no JVP Lymph- no cervical lymphadenopathy Lungs- Clear to ausculation bilaterally, normal work of breathing Heart- Regular rate and rhythm, no murmurs, rubs or gallops, PMI not laterally displaced GI- soft, NT, ND, + BS Extremities- no clubbing, cyanosis, or edema MS- no significant deformity or atrophy  ekg today reveals sinus rhythm 98 bpm, nonspecific ST/T changes  Assessment and Plan:  1. VT Well controlled, without symptomatic recurrence No changes  2. ETOH  Cessation advised  Return in 12 months to see EP PA-C  Hillis Range MD, [facility] 01/26/2015 2:32 PM

## 2015-01-28 ENCOUNTER — Encounter: Payer: Self-pay | Admitting: Nurse Practitioner

## 2015-01-28 ENCOUNTER — Ambulatory Visit (INDEPENDENT_AMBULATORY_CARE_PROVIDER_SITE_OTHER): Payer: 59 | Admitting: Nurse Practitioner

## 2015-01-28 VITALS — BP 120/85 | HR 104 | Temp 97.3°F | Ht 68.0 in | Wt 167.0 lb

## 2015-01-28 DIAGNOSIS — E039 Hypothyroidism, unspecified: Secondary | ICD-10-CM

## 2015-01-28 DIAGNOSIS — G4700 Insomnia, unspecified: Secondary | ICD-10-CM | POA: Diagnosis not present

## 2015-01-28 DIAGNOSIS — Z6825 Body mass index (BMI) 25.0-25.9, adult: Secondary | ICD-10-CM | POA: Insufficient documentation

## 2015-01-28 DIAGNOSIS — E785 Hyperlipidemia, unspecified: Secondary | ICD-10-CM | POA: Diagnosis not present

## 2015-01-28 DIAGNOSIS — Z23 Encounter for immunization: Secondary | ICD-10-CM | POA: Diagnosis not present

## 2015-01-28 DIAGNOSIS — F1010 Alcohol abuse, uncomplicated: Secondary | ICD-10-CM

## 2015-01-28 DIAGNOSIS — I10 Essential (primary) hypertension: Secondary | ICD-10-CM

## 2015-01-28 DIAGNOSIS — E876 Hypokalemia: Secondary | ICD-10-CM

## 2015-01-28 HISTORY — DX: Body mass index (BMI) 25.0-25.9, adult: Z68.25

## 2015-01-28 MED ORDER — MAGNESIUM OXIDE 400 MG PO TABS: 400.0000 mg | ORAL_TABLET | Freq: Every day | ORAL | Status: DC

## 2015-01-28 MED ORDER — FENOFIBRATE 54 MG PO TABS: ORAL_TABLET | ORAL | Status: DC

## 2015-01-28 MED ORDER — SIMVASTATIN 40 MG PO TABS: 40.0000 mg | ORAL_TABLET | Freq: Every day | ORAL | Status: DC

## 2015-01-28 MED ORDER — POTASSIUM CHLORIDE CRYS ER 10 MEQ PO TBCR: EXTENDED_RELEASE_TABLET | ORAL | Status: DC

## 2015-01-28 MED ORDER — LORAZEPAM 2 MG PO TABS: 2.0000 mg | ORAL_TABLET | Freq: Every day | ORAL | Status: DC

## 2015-01-28 NOTE — Progress Notes (Signed)
Subjective:    Patient ID: Leonette NuttingMichael L Mullinax, male    DOB: January 03, 1971, 44 y.o.   MRN: [id number]  Patient here today for follow up  Of chronic medical problems.  Hypertension This is a chronic problem. The current episode started more than 1 year ago. The problem is uncontrolled. Pertinent negatives include no headaches, palpitations, peripheral edema or shortness of breath. Risk factors for coronary artery disease include dyslipidemia and male gender. The current treatment provides mild improvement. Compliance problems include diet and exercise.   Hyperlipidemia This is a chronic problem. The current episode started more than 1 year ago. The problem is uncontrolled. Recent lipid tests were reviewed and are variable. Exacerbating diseases include obesity. He has no history of diabetes or hypothyroidism. Pertinent negatives include no myalgias or shortness of breath. Current antihyperlipidemic treatment includes statins and fibric acid derivatives. The current treatment provides moderate improvement of lipids. Compliance problems include adherence to diet and adherence to exercise.  Risk factors for coronary artery disease include dyslipidemia, hypertension and male sex.  Insomnia Pt takes 1/2 ativan about 3-4 nights a weeks- Works well with no complaints  Alcohol abuse Patient says that he drinks daily- sometimes beer sometimes liquor. Has no desire to stop. Hypokalemia Potassium supplement daily- no cramps. Hemochromatosis Patient says he is feeling okay right now- no fatigue. NOt 100% sure is this is correct diagnosis according to hematologist- has follow in March.    Review of Systems  Constitutional: Negative.   HENT: Negative.   Respiratory: Negative for shortness of breath.   Cardiovascular: Negative for palpitations.  Musculoskeletal: Negative for myalgias.  Neurological: Negative for headaches.  All other systems reviewed and are negative.      Objective:   Physical Exam   Constitutional: He is oriented to person, place, and time. He appears well-developed and well-nourished.  HENT:  Head: Normocephalic.  Right Ear: External ear normal.  Left Ear: External ear normal.  Nose: Nose normal.  Mouth/Throat: Oropharynx is clear and moist.  Eyes: EOM are normal. Pupils are equal, round, and reactive to light.  Neck: Normal range of motion. Neck supple. No JVD present. No thyromegaly present.  Cardiovascular: Normal rate, regular rhythm, normal heart sounds and intact distal pulses.  Exam reveals no gallop and no friction rub.   No murmur heard. Pulmonary/Chest: Effort normal and breath sounds normal. No respiratory distress. He has no wheezes. He has no rales. He exhibits no tenderness.  Abdominal: Soft. Bowel sounds are normal. He exhibits no mass. There is no tenderness.  Genitourinary: Prostate normal and penis normal.  Musculoskeletal: Normal range of motion. He exhibits no edema.  Lymphadenopathy:    He has no cervical adenopathy.  Neurological: He is alert and oriented to person, place, and time. No cranial nerve deficit.  Skin: Skin is warm and dry.  Psychiatric: He has a normal mood and affect. His behavior is normal. Judgment and thought content normal.     BP 120/85 mmHg  Pulse 104  Temp(Src) 97.3 F (36.3 C) (Oral)  Ht 5\' 8"  (1.727 m)  Wt 167 lb (75.751 kg)  BMI 25.40 kg/m2      Assessment & Plan:   1. Essential hypertension Do not add salt to diet  2. Hypothyroidism, unspecified hypothyroidism type  3. Hyperlipidemia Low fat diet - simvastatin (ZOCOR) 40 MG tablet; Take 1 tablet (40 mg total) by mouth daily.  Dispense: 90 tablet; Refill: 1 - fenofibrate 54 MG tablet; Take 1 tablet by mouth  daily  Dispense: 90 tablet; Refill: 1  4. Hemochromatosis Labs pending  5. Insomnia Bedtime ritual  6. Alcohol abuse Need to avoid alcohol  7. BMI 25.0-25.9,adult Discussed diet and exercise for person with BMI >25 Will recheck weight  in 3-6 months  8. Hypokalemia - potassium chloride (K-DUR,KLOR-CON) 10 MEQ tablet; Take 1 tablet by mouth  every day  Dispense: 90 tablet; Refill: 1    Labs pending Health maintenance reviewed Diet and exercise encouraged Continue all meds Follow up  In 3 months   Mary-Margaret Daphine Deutscher, FNP

## 2015-01-28 NOTE — Addendum Note (Signed)
Addended by: Bennie PieriniMARTIN, MARY-MARGARET on: 01/28/2015 02:51 PM   Modules accepted: Orders

## 2015-01-28 NOTE — Patient Instructions (Signed)

## 2015-02-01 LAB — CMP14+EGFR
A/G RATIO: 1.5 (ref 1.1–2.5)
ALT: 55 IU/L — AB (ref 0–44)
AST: 76 IU/L — AB (ref 0–40)
Albumin: 4.4 g/dL (ref 3.5–5.5)
Alkaline Phosphatase: 72 IU/L (ref 39–117)
BUN/Creatinine Ratio: 6 — ABNORMAL LOW (ref 9–20)
BUN: 5 mg/dL — AB (ref 6–24)
Bilirubin Total: 0.5 mg/dL (ref 0.0–1.2)
CALCIUM: 9.1 mg/dL (ref 8.7–10.2)
CHLORIDE: 98 mmol/L (ref 97–106)
CO2: 22 mmol/L (ref 18–29)
Creatinine, Ser: 0.86 mg/dL (ref 0.76–1.27)
GFR calc Af Amer: 120 mL/min/{1.73_m2} (ref >59)
GFR, EST NON AFRICAN AMERICAN: 104 mL/min/{1.73_m2} (ref >59)
GLUCOSE: 90 mg/dL (ref 65–99)
Globulin, Total: 3 g/dL (ref 1.5–4.5)
POTASSIUM: 4 mmol/L (ref 3.5–5.2)
Sodium: 141 mmol/L (ref 136–144)
Total Protein: 7.4 g/dL (ref 6.0–8.5)

## 2015-02-01 LAB — CBC WITH DIFFERENTIAL/PLATELET
BASOS ABS: 0.1 10*3/uL (ref 0.0–0.2)
BASOS: 1 %
EOS (ABSOLUTE): 0 10*3/uL (ref 0.0–0.4)
Eos: 1 %
Hematocrit: 43.4 % (ref 37.5–51.0)
Hemoglobin: 14.9 g/dL (ref 12.6–17.7)
IMMATURE GRANULOCYTES: 1 %
Immature Grans (Abs): 0 10*3/uL (ref 0.0–0.1)
LYMPHS: 35 %
Lymphocytes Absolute: 1.4 10*3/uL (ref 0.7–3.1)
MCH: 33 pg (ref 26.6–33.0)
MCHC: 34.3 g/dL (ref 31.5–35.7)
MCV: 96 fL (ref 79–97)
MONOS ABS: 0.7 10*3/uL (ref 0.1–0.9)
Monocytes: 17 %
NEUTROS PCT: 45 %
Neutrophils Absolute: 1.8 10*3/uL (ref 1.4–7.0)
PLATELETS: 195 10*3/uL (ref 150–379)
RBC: 4.52 x10E6/uL (ref 4.14–5.80)
RDW: 14.8 % (ref 12.3–15.4)
WBC: 4 10*3/uL (ref 3.4–10.8)

## 2015-02-01 LAB — CHROMIUM AND COBALT, WB (MOM)
Chromium: 1.2 ng/mL (ref <3.0)
Cobalt: <1 ng/mL (ref <3.0)

## 2015-02-01 LAB — LIPID PANEL
CHOL/HDL RATIO: 2 ratio (ref 0.0–5.0)
Cholesterol, Total: 166 mg/dL (ref 100–199)
HDL: 85 mg/dL (ref >39)
LDL Calculated: 61 mg/dL (ref 0–99)
TRIGLYCERIDES: 100 mg/dL (ref 0–149)
VLDL Cholesterol Cal: 20 mg/dL (ref 5–40)

## 2015-02-02 NOTE — Progress Notes (Signed)
Patient aware.

## 2015-03-20 ENCOUNTER — Inpatient Hospital Stay (HOSPITAL_COMMUNITY)
Admission: EM | Admit: 2015-03-20 | Discharge: 2015-03-22 | DRG: 193 | Disposition: A | Discharge status: Home / Self Care | Payer: 59 | Attending: Internal Medicine | Admitting: Internal Medicine

## 2015-03-20 ENCOUNTER — Emergency Department (HOSPITAL_COMMUNITY): Payer: 59

## 2015-03-20 ENCOUNTER — Encounter (HOSPITAL_COMMUNITY): Payer: Self-pay | Admitting: *Deleted

## 2015-03-20 DIAGNOSIS — E039 Hypothyroidism, unspecified: Secondary | ICD-10-CM | POA: Diagnosis present

## 2015-03-20 DIAGNOSIS — G4700 Insomnia, unspecified: Secondary | ICD-10-CM | POA: Diagnosis present

## 2015-03-20 DIAGNOSIS — F17200 Nicotine dependence, unspecified, uncomplicated: Secondary | ICD-10-CM | POA: Diagnosis present

## 2015-03-20 DIAGNOSIS — J13 Pneumonia due to Streptococcus pneumoniae: Principal | ICD-10-CM | POA: Diagnosis present

## 2015-03-20 DIAGNOSIS — E785 Hyperlipidemia, unspecified: Secondary | ICD-10-CM | POA: Diagnosis present

## 2015-03-20 DIAGNOSIS — G936 Cerebral edema: Secondary | ICD-10-CM | POA: Diagnosis present

## 2015-03-20 DIAGNOSIS — J690 Pneumonitis due to inhalation of food and vomit: Secondary | ICD-10-CM | POA: Diagnosis not present

## 2015-03-20 DIAGNOSIS — F10929 Alcohol use, unspecified with intoxication, unspecified: Secondary | ICD-10-CM | POA: Diagnosis present

## 2015-03-20 DIAGNOSIS — Z823 Family history of stroke: Secondary | ICD-10-CM | POA: Diagnosis not present

## 2015-03-20 DIAGNOSIS — J9600 Acute respiratory failure, unspecified whether with hypoxia or hypercapnia: Secondary | ICD-10-CM | POA: Diagnosis present

## 2015-03-20 DIAGNOSIS — F10129 Alcohol abuse with intoxication, unspecified: Secondary | ICD-10-CM

## 2015-03-20 DIAGNOSIS — I472 Ventricular tachycardia, unspecified: Secondary | ICD-10-CM

## 2015-03-20 DIAGNOSIS — I4729 Other ventricular tachycardia: Secondary | ICD-10-CM

## 2015-03-20 DIAGNOSIS — E876 Hypokalemia: Secondary | ICD-10-CM | POA: Diagnosis present

## 2015-03-20 DIAGNOSIS — Z833 Family history of diabetes mellitus: Secondary | ICD-10-CM

## 2015-03-20 DIAGNOSIS — F1010 Alcohol abuse, uncomplicated: Secondary | ICD-10-CM | POA: Diagnosis not present

## 2015-03-20 DIAGNOSIS — J9601 Acute respiratory failure with hypoxia: Secondary | ICD-10-CM | POA: Diagnosis not present

## 2015-03-20 DIAGNOSIS — Z8249 Family history of ischemic heart disease and other diseases of the circulatory system: Secondary | ICD-10-CM | POA: Diagnosis not present

## 2015-03-20 DIAGNOSIS — I4581 Long QT syndrome: Secondary | ICD-10-CM

## 2015-03-20 DIAGNOSIS — F4323 Adjustment disorder with mixed anxiety and depressed mood: Secondary | ICD-10-CM

## 2015-03-20 DIAGNOSIS — Z96641 Presence of right artificial hip joint: Secondary | ICD-10-CM | POA: Diagnosis present

## 2015-03-20 DIAGNOSIS — R9431 Abnormal electrocardiogram [ECG] [EKG]: Secondary | ICD-10-CM | POA: Diagnosis present

## 2015-03-20 DIAGNOSIS — I10 Essential (primary) hypertension: Secondary | ICD-10-CM | POA: Diagnosis present

## 2015-03-20 DIAGNOSIS — R4182 Altered mental status, unspecified: Secondary | ICD-10-CM | POA: Diagnosis not present

## 2015-03-20 DIAGNOSIS — G9340 Encephalopathy, unspecified: Secondary | ICD-10-CM | POA: Diagnosis not present

## 2015-03-20 DIAGNOSIS — F10120 Alcohol abuse with intoxication, uncomplicated: Secondary | ICD-10-CM | POA: Diagnosis not present

## 2015-03-20 HISTORY — DX: Abnormal electrocardiogram (ECG) (EKG): R94.31

## 2015-03-20 LAB — BASIC METABOLIC PANEL
Anion gap: 16 — ABNORMAL HIGH (ref 5–15)
BUN: 7 mg/dL (ref 6–20)
CO2: 29 mmol/L (ref 22–32)
Calcium: 8.6 mg/dL — ABNORMAL LOW (ref 8.9–10.3)
Chloride: 97 mmol/L — ABNORMAL LOW (ref 101–111)
Creatinine, Ser: 0.95 mg/dL (ref 0.61–1.24)
GFR calc Af Amer: >60 mL/min (ref >60)
GFR calc non Af Amer: >60 mL/min (ref >60)
Glucose, Bld: 166 mg/dL — ABNORMAL HIGH (ref 65–99)
Potassium: 3.2 mmol/L — ABNORMAL LOW (ref 3.5–5.1)
Sodium: 142 mmol/L (ref 135–145)

## 2015-03-20 LAB — LACTIC ACID, PLASMA
LACTIC ACID, VENOUS: 3.5 mmol/L — AB (ref 0.5–2.0)
Lactic Acid, Venous: 4.1 mmol/L (ref 0.5–2.0)

## 2015-03-20 LAB — HEPATIC FUNCTION PANEL
ALT: 59 U/L (ref 17–63)
AST: 77 U/L — ABNORMAL HIGH (ref 15–41)
Albumin: 4.2 g/dL (ref 3.5–5.0)
Alkaline Phosphatase: 68 U/L (ref 38–126)
Bilirubin, Direct: 0.2 mg/dL (ref 0.1–0.5)
Indirect Bilirubin: 0.4 mg/dL (ref 0.3–0.9)
Total Bilirubin: 0.6 mg/dL (ref 0.3–1.2)
Total Protein: 7.6 g/dL (ref 6.5–8.1)

## 2015-03-20 LAB — URINALYSIS, ROUTINE W REFLEX MICROSCOPIC
Bilirubin Urine: NEGATIVE
GLUCOSE, UA: 100 mg/dL — AB
Ketones, ur: NEGATIVE mg/dL
LEUKOCYTES UA: NEGATIVE
Nitrite: NEGATIVE
PH: 5.5 (ref 5.0–8.0)
Protein, ur: 100 mg/dL — AB
Specific Gravity, Urine: >1.03 — ABNORMAL HIGH (ref 1.005–1.030)

## 2015-03-20 LAB — BLOOD GAS, ARTERIAL
Acid-Base Excess: 1.1 mmol/L (ref 0.0–2.0)
Bicarbonate: 24.9 mEq/L — ABNORMAL HIGH (ref 20.0–24.0)
Drawn by: 382351
FIO2: 0.44
O2 Saturation: 98.4 %
Patient temperature: 37
pCO2 arterial: 46.7 mmHg — ABNORMAL HIGH (ref 35.0–45.0)
pH, Arterial: 7.363 (ref 7.350–7.450)
pO2, Arterial: 143 mmHg — ABNORMAL HIGH (ref 80.0–100.0)

## 2015-03-20 LAB — RAPID URINE DRUG SCREEN, HOSP PERFORMED
AMPHETAMINES: NOT DETECTED
BARBITURATES: NOT DETECTED
BENZODIAZEPINES: POSITIVE — AB
Cocaine: NOT DETECTED
Opiates: NOT DETECTED
TETRAHYDROCANNABINOL: NOT DETECTED

## 2015-03-20 LAB — AMMONIA: Ammonia: 38 umol/L — ABNORMAL HIGH (ref 9–35)

## 2015-03-20 LAB — CBC WITH DIFFERENTIAL/PLATELET
Basophils Absolute: 0.1 10*3/uL (ref 0.0–0.1)
Basophils Relative: 1 %
Eosinophils Absolute: 0.1 10*3/uL (ref 0.0–0.7)
Eosinophils Relative: 1 %
HCT: 46 % (ref 39.0–52.0)
Hemoglobin: 15.8 g/dL (ref 13.0–17.0)
Lymphocytes Relative: 32 %
Lymphs Abs: 2 10*3/uL (ref 0.7–4.0)
MCH: 35.7 pg — ABNORMAL HIGH (ref 26.0–34.0)
MCHC: 34.3 g/dL (ref 30.0–36.0)
MCV: 104.1 fL — ABNORMAL HIGH (ref 78.0–100.0)
Monocytes Absolute: 0.7 10*3/uL (ref 0.1–1.0)
Monocytes Relative: 11 %
Neutro Abs: 3.4 10*3/uL (ref 1.7–7.7)
Neutrophils Relative %: 55 %
Platelets: 175 10*3/uL (ref 150–400)
RBC: 4.42 MIL/uL (ref 4.22–5.81)
RDW: 16.1 % — ABNORMAL HIGH (ref 11.5–15.5)
WBC: 6.1 10*3/uL (ref 4.0–10.5)

## 2015-03-20 LAB — URINE MICROSCOPIC-ADD ON

## 2015-03-20 LAB — CBG MONITORING, ED: Glucose-Capillary: 153 mg/dL — ABNORMAL HIGH (ref 65–99)

## 2015-03-20 LAB — TROPONIN I: Troponin I: <0.03 ng/mL (ref <0.031)

## 2015-03-20 LAB — MAGNESIUM: MAGNESIUM: 1.6 mg/dL — AB (ref 1.7–2.4)

## 2015-03-20 LAB — ETHANOL: Alcohol, Ethyl (B): 588 mg/dL (ref <5)

## 2015-03-20 MED ORDER — LORAZEPAM 2 MG/ML IJ SOLN
0.0000 mg | Freq: Two times a day (BID) | INTRAMUSCULAR | Status: DC
Start: 2015-03-22 — End: 2015-03-22

## 2015-03-20 MED ORDER — LORAZEPAM 2 MG/ML IJ SOLN
0.0000 mg | Freq: Four times a day (QID) | INTRAMUSCULAR | Status: DC
Start: 2015-03-20 — End: 2015-03-22
  Administered 2015-03-21 (×3): 2 mg via INTRAVENOUS
  Filled 2015-03-20 (×5): qty 1

## 2015-03-20 MED ORDER — POTASSIUM CHLORIDE 10 MEQ/100ML IV SOLN
10.0000 meq | INTRAVENOUS | Status: DC
  Administered 2015-03-20: 10 meq via INTRAVENOUS
  Filled 2015-03-20 (×2): qty 100

## 2015-03-20 MED ORDER — FOLIC ACID 5 MG/ML IJ SOLN
INTRAMUSCULAR | Status: AC
  Filled 2015-03-20: qty 0.2

## 2015-03-20 MED ORDER — PIPERACILLIN-TAZOBACTAM 3.375 G IVPB
3.3750 g | Freq: Three times a day (TID) | INTRAVENOUS | Status: DC
  Administered 2015-03-20 – 2015-03-21 (×2): 3.375 g via INTRAVENOUS
  Filled 2015-03-20 (×2): qty 50

## 2015-03-20 MED ORDER — PHENOL 1.4 % MT LIQD
OROMUCOSAL | Status: AC
  Filled 2015-03-20: qty 177

## 2015-03-20 MED ORDER — SODIUM CHLORIDE 0.9 % IV SOLN
INTRAVENOUS | Status: DC
  Administered 2015-03-20 – 2015-03-22 (×3): via INTRAVENOUS

## 2015-03-20 MED ORDER — LORAZEPAM 2 MG/ML IJ SOLN
1.0000 mg | Freq: Four times a day (QID) | INTRAMUSCULAR | Status: DC | PRN
  Administered 2015-03-20 – 2015-03-22 (×4): 1 mg via INTRAVENOUS
  Filled 2015-03-20 (×3): qty 1

## 2015-03-20 MED ORDER — ENOXAPARIN SODIUM 40 MG/0.4ML ~~LOC~~ SOLN
40.0000 mg | SUBCUTANEOUS | Status: DC
  Administered 2015-03-20 – 2015-03-21 (×2): 40 mg via SUBCUTANEOUS
  Filled 2015-03-20 (×2): qty 0.4

## 2015-03-20 MED ORDER — PHENOL 1.4 % MT LIQD
1.0000 | OROMUCOSAL | Status: DC | PRN
  Administered 2015-03-20: 1 via OROMUCOSAL
  Filled 2015-03-20: qty 177

## 2015-03-20 MED ORDER — SODIUM CHLORIDE 0.9 % IV SOLN
Freq: Once | INTRAVENOUS | Status: AC
  Administered 2015-03-20: 20:00:00 via INTRAVENOUS

## 2015-03-20 MED ORDER — THIAMINE HCL 100 MG/ML IJ SOLN
100.0000 mg | Freq: Once | INTRAMUSCULAR | Status: AC
  Administered 2015-03-20: 100 mg via INTRAVENOUS
  Filled 2015-03-20: qty 2

## 2015-03-20 MED ORDER — MAGNESIUM SULFATE 2 GM/50ML IV SOLN
2.0000 g | Freq: Once | INTRAVENOUS | Status: AC
  Administered 2015-03-20: 2 g via INTRAVENOUS
  Filled 2015-03-20: qty 50

## 2015-03-20 MED ORDER — POTASSIUM CHLORIDE CRYS ER 20 MEQ PO TBCR
20.0000 meq | EXTENDED_RELEASE_TABLET | Freq: Two times a day (BID) | ORAL | Status: DC
  Administered 2015-03-21: 20 meq via ORAL
  Filled 2015-03-20 (×2): qty 1

## 2015-03-20 MED ORDER — SODIUM CHLORIDE 0.9 % IV SOLN
Freq: Once | INTRAVENOUS | Status: AC
  Administered 2015-03-20: 18:00:00 via INTRAVENOUS

## 2015-03-20 MED ORDER — PNEUMOCOCCAL VAC POLYVALENT 25 MCG/0.5ML IJ INJ
0.5000 mL | INJECTION | INTRAMUSCULAR | Status: DC
Start: 2015-03-21 — End: 2015-03-22
  Filled 2015-03-20: qty 0.5

## 2015-03-20 MED ORDER — THIAMINE HCL 100 MG/ML IJ SOLN: 100.0000 mg | Freq: Every day | INTRAMUSCULAR | Status: DC

## 2015-03-20 MED ORDER — VITAMIN B-1 100 MG PO TABS
100.0000 mg | ORAL_TABLET | Freq: Every day | ORAL | Status: DC
  Administered 2015-03-21 – 2015-03-22 (×2): 100 mg via ORAL
  Filled 2015-03-20 (×2): qty 1

## 2015-03-20 MED ORDER — LORAZEPAM 1 MG PO TABS: 1.0000 mg | ORAL_TABLET | Freq: Four times a day (QID) | ORAL | Status: DC | PRN

## 2015-03-20 MED ORDER — DEXTROSE 5 % IV SOLN
1.0000 g | Freq: Once | INTRAVENOUS | Status: AC
  Administered 2015-03-20: 1 g via INTRAVENOUS
  Filled 2015-03-20: qty 10

## 2015-03-20 MED ORDER — METRONIDAZOLE IN NACL 5-0.79 MG/ML-% IV SOLN
500.0000 mg | Freq: Once | INTRAVENOUS | Status: AC
  Administered 2015-03-20: 500 mg via INTRAVENOUS
  Filled 2015-03-20: qty 100

## 2015-03-20 MED ORDER — FOLIC ACID 1 MG PO TABS
1.0000 mg | ORAL_TABLET | Freq: Every day | ORAL | Status: DC
  Administered 2015-03-21 – 2015-03-22 (×2): 1 mg via ORAL
  Filled 2015-03-20 (×2): qty 1

## 2015-03-20 MED ORDER — THIAMINE HCL 100 MG/ML IJ SOLN
INTRAMUSCULAR | Status: AC
  Filled 2015-03-20: qty 2

## 2015-03-20 MED ORDER — M.V.I. ADULT IV INJ
INJECTION | INTRAVENOUS | Status: AC
  Filled 2015-03-20: qty 10

## 2015-03-20 MED ORDER — FOLIC ACID 5 MG/ML IJ SOLN
1.0000 mg | Freq: Once | INTRAMUSCULAR | Status: AC
  Administered 2015-03-20: 1 mg via INTRAVENOUS
  Filled 2015-03-20: qty 0.2

## 2015-03-20 MED ORDER — ADULT MULTIVITAMIN W/MINERALS CH
1.0000 | ORAL_TABLET | Freq: Every day | ORAL | Status: DC
  Administered 2015-03-21 – 2015-03-22 (×2): 1 via ORAL
  Filled 2015-03-20 (×2): qty 1

## 2015-03-20 MED ORDER — ONDANSETRON HCL 4 MG/2ML IJ SOLN
4.0000 mg | Freq: Once | INTRAMUSCULAR | Status: AC
  Administered 2015-03-20: 4 mg via INTRAVENOUS
  Filled 2015-03-20: qty 2

## 2015-03-20 MED ORDER — THIAMINE HCL 100 MG/ML IJ SOLN
Freq: Once | INTRAVENOUS | Status: AC
  Administered 2015-03-20: via INTRAVENOUS
  Filled 2015-03-20: qty 1000

## 2015-03-20 NOTE — Progress Notes (Signed)
ANTIBIOTIC CONSULT NOTE-Preliminary  Pharmacy Consult for Zosyn Indication: aspiration pna  Allergies  Allergen Reactions  . Hctz [Hydrochlorothiazide] Rash    Patient Measurements: Height:  (170.2 cm) Weight: 167 lb 15.9 oz (76.2 kg) IBW/kg (Calculated) : 66.1  Vital Signs: Temp: 97.5 F (36.4 C) (01/29 2100) Temp Source: Oral (01/29 2100) BP: 103/83 mmHg (01/29 2000) Pulse Rate: 103 (01/29 2015)  Labs:  Recent Labs  03/20/15 1735  WBC 6.1  HGB 15.8  PLT 175  CREATININE 0.95    Estimated Creatinine Clearance: 90.8 mL/min (by C-G formula based on Cr of 0.95).  No results for input(s): VANCOTROUGH, VANCOPEAK, VANCORANDOM, GENTTROUGH, GENTPEAK, GENTRANDOM, TOBRATROUGH, TOBRAPEAK, TOBRARND, AMIKACINPEAK, AMIKACINTROU, AMIKACIN in the last 72 hours.   Microbiology: No results found for this or any previous visit (from the past 720 hour(s)).  Medical History: Past Medical History  Diagnosis Date  . Ventricular tachycardia (HCC)     in setting of hypokalemia/ hypomagnesemia and ETOH  . HLD (hyperlipidemia)   . HTN (hypertension)   . B12 deficiency   . Gout   . Hypothyroidism   . Alcohol abuse   . Bronchitis    Assessment: Admitted withpossible aspiration pna.  Asked to initiate Zosyn  Goal of Therapy:  Eradicate infection.  Plan:  Preliminary review of pertinent patient information completed.  Protocol will be initiated with Zosyn 3.375gm IV q8h [facility] clinical pharmacist will complete review during morning rounds to assess patient and finalize treatment regimen.  Valrie Hart A, [facility] 03/20/2015,9:22 PM

## 2015-03-20 NOTE — ED Provider Notes (Signed)
CSN: [id number]     Arrival date & time 03/20/15  1718 History   First MD Initiated Contact with Patient 03/20/15 1734     Chief Complaint  Patient presents with  . Altered Mental Status     (Consider location/radiation/quality/duration/timing/severity/associated sxs/prior Treatment) HPI   45 year old male found poorly responsive and his residence. EMS reports that his sister found him laying on the ground and difficult to arouse. His wife subsequently came to the emergency room and reports he does not have a sister. She reports is a history of alcohol abuse. They separated this past October. History country and he remains stably more since then. She reports that he also takes Ativan that is prescribed inappropriately. No drug use that she is aware of. Patient himself appears to be highly intoxicated and unable to provide any useful history.  Past Medical History  Diagnosis Date  . Ventricular tachycardia (HCC)     in setting of hypokalemia/ hypomagnesemia and ETOH  . HLD (hyperlipidemia)   . HTN (hypertension)   . B12 deficiency   . Gout   . Hypothyroidism   . Alcohol abuse   . Bronchitis    Past Surgical History  Procedure Laterality Date  . 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 Total hip arthroplasty Right    Family History  Problem Relation Age of Onset  . Hypertension Father   . Diabetes Maternal Grandmother   . Hypertension Paternal Grandmother   . Stroke Paternal Grandmother    Social History  Substance Use Topics  . Smoking status: Former Smoker -- 0.20 packs/day    Quit date: 02/19/2010  . Smokeless tobacco: None  . Alcohol Use: Yes     Comment: drinking since Thursday    Review of Systems  Level V caveat because of alcohol intoxication  Allergies  Hctz  Home Medications   Prior to Admission medications   Medication Sig Start Date End Date Taking? Authorizing Provider  CIALIS 20 MG tablet TAKE ONE TABLET BY MOUTH AS  NEEDED 01/14/15   Mechele Claude, MD  diphenhydrAMINE (BENADRYL) 25 MG tablet Take 25 mg by mouth every 6 (six) hours as needed for allergies.     Historical Provider, MD  fenofibrate 54 MG tablet Take 1 tablet by mouth  daily 01/28/15   Mary-Margaret Daphine Deutscher, FNP  folic acid (FOLVITE) 1 MG tablet Take 1 tablet (1 mg total) by mouth daily. 10/20/13   Alla German, MD  LORazepam (ATIVAN) 2 MG tablet Take 1 tablet (2 mg total) by mouth daily with breakfast. 01/28/15   Mary-Margaret Daphine Deutscher, FNP  magnesium oxide (MAG-OX) 400 MG tablet Take 1 tablet (400 mg total) by mouth daily. 01/28/15   Mary-Margaret Daphine Deutscher, FNP  metoprolol succinate (TOPROL-XL) 50 MG 24 hr tablet Take 1 tablet by mouth  daily; with, or immediately following a meal 12/23/14   Hillis Range, MD  potassium chloride (K-DUR,KLOR-CON) 10 MEQ tablet Take 1 tablet by mouth  every day 01/28/15   Mary-Margaret Daphine Deutscher, FNP  simvastatin (ZOCOR) 40 MG tablet Take 1 tablet (40 mg total) by mouth daily. 01/28/15   Mary-Margaret Daphine Deutscher, FNP  Vitamin D, Ergocalciferol, (DRISDOL) 50000 UNITS CAPS capsule Take 1 capsule by mouth  once a week 11/03/14   Mechele Claude, MD   BP 103/83 mmHg  Pulse 103  Temp(Src) 97.5 F (36.4 C) (Oral)  Resp 29  Ht  (1.702 m)  Wt 167 lb 15.9 oz (76.2 kg)  BMI  26.30 kg/m2  SpO2 91% Physical Exam  Constitutional: He appears well-developed and well-nourished. No distress.  HENT:  Head: Normocephalic and atraumatic.  Eyes: Conjunctivae are normal. Right eye exhibits no discharge. Left eye exhibits no discharge.  Neck: Neck supple.  Cardiovascular: Normal rate, regular rhythm and normal heart sounds.  Exam reveals no gallop and no friction rub.   No murmur heard. Pulmonary/Chest: Effort normal and breath sounds normal. No respiratory distress.  Abdominal: Soft. He exhibits no distension. There is no tenderness.  Musculoskeletal: He exhibits no edema or tenderness.  Neurological:  Very drowsy. Speech is slurred. Can  give his name. He will follow very basic commands such as squeeze fingers. Moves all extremities spontaneously.  Skin: Skin is warm and dry.  Psychiatric:  Appears highly intoxicated.   Nursing note and vitals reviewed.   ED Course  Procedures (including critical care time) Labs Review Labs Reviewed  CBC WITH DIFFERENTIAL/PLATELET - Abnormal; Notable for the following:    MCV 104.1 (*)    MCH 35.7 (*)    RDW 16.1 (*)    All other components within normal limits  BASIC METABOLIC PANEL - Abnormal; Notable for the following:    Potassium 3.2 (*)    Chloride 97 (*)    Glucose, Bld 166 (*)    Calcium 8.6 (*)    Anion gap 16 (*)    All other components within normal limits  ETHANOL - Abnormal; Notable for the following:    Alcohol, Ethyl (B) 588 (*)    All other components within normal limits  HEPATIC FUNCTION PANEL - Abnormal; Notable for the following:    AST 77 (*)    All other components within normal limits  AMMONIA - Abnormal; Notable for the following:    Ammonia 38 (*)    All other components within normal limits  BLOOD GAS, ARTERIAL - Abnormal; Notable for the following:    pCO2 arterial 46.7 (*)    pO2, Arterial 143 (*)    Bicarbonate 24.9 (*)    All other components within normal limits  LACTIC ACID, PLASMA - Abnormal; Notable for the following:    Lactic Acid, Venous 4.1 (*)    All other components within normal limits  MAGNESIUM - Abnormal; Notable for the following:    Magnesium 1.6 (*)    All other components within normal limits  CBG MONITORING, ED - Abnormal; Notable for the following:    Glucose-Capillary 153 (*)    All other components within normal limits  MRSA PCR SCREENING  CULTURE, BLOOD (ROUTINE X 2)  CULTURE, BLOOD (ROUTINE X 2)  CULTURE, EXPECTORATED SPUTUM-ASSESSMENT  GRAM STAIN  TROPONIN I  LACTIC ACID, PLASMA  URINALYSIS, ROUTINE W REFLEX MICROSCOPIC (NOT AT [facility])  URINE RAPID DRUG SCREEN, HOSP PERFORMED  HIV ANTIBODY (ROUTINE TESTING)   STREP PNEUMONIAE URINARY ANTIGEN  COMPREHENSIVE METABOLIC PANEL  CBC WITH DIFFERENTIAL/PLATELET    Imaging Review Ct Head Wo Contrast  03/20/2015  CLINICAL DATA:  45 year old male with altered mental status. Intoxicated. EXAM: CT HEAD WITHOUT CONTRAST TECHNIQUE: Contiguous axial images were obtained from the base of the skull through the vertex without intravenous contrast. COMPARISON:  None. FINDINGS: Atrophy identified. No acute intracranial abnormalities are identified, including mass lesion or mass effect, hydrocephalus, extra-axial fluid collection, midline shift, hemorrhage, or acute infarction. The visualized bony calvarium is unremarkable. IMPRESSION: No evidence of acute intracranial abnormality. Atrophy. Electronically Signed   By: Harmon Pier M.D.   On: 03/20/2015 18:40   Dg  Chest Portable 1 View  03/20/2015  CLINICAL DATA:  Intoxicated. Hypoxia. Unresponsive. Possible aspiration. EXAM: PORTABLE CHEST 1 VIEW COMPARISON:  03/08/2006 FINDINGS: Low lung volumes are noted. Pulmonary infiltrate or atelectasis is seen in the medial right lung base. Heart size is within normal limits. No evidence of pneumothorax or definite pleural effusion. IMPRESSION: New low lung volumes with infiltrate or atelectasis in medial right lung base. Electronically Signed   By: Myles Rosenthal M.D.   On: 03/20/2015 19:13   I have personally reviewed and evaluated these images and lab results as part of my medical decision-making.   EKG Interpretation   Date/Time:  Sunday March 20 2015 17:24:59 EST Ventricular Rate:  89 PR Interval:  141 QRS Duration: 64 QT Interval:  437 QTC Calculation: 532 R Axis:   96 Text Interpretation:  Sinus rhythm Borderline right axis deviation  Borderline T abnormalities, diffuse leads Prolonged QT interval Confirmed  by Juleen China  MD, Eraina Winnie ([id number]) on 03/20/2015 7:30:48 PM      MDM   Final diagnoses:  Alcohol intoxication, with unspecified complication (HCC)  Aspiration  pneumonia of right lung, unspecified aspiration pneumonia type, unspecified part of lung (HCC)    45 year old male found down poorly responsive. Suspect secondary to alcohol intoxication. His alcohol level is markedly elevated. Also noted to be hypoxic. Suspect he may have aspirated. There is a right-sided opacity on CXR. He continues to require supplemental oxygen via nasal cannula. Antibiotics for possible aspiration pneumonia. Mild hyperkalemia noted. Repletion. I do not feel that patient will be clinically sober in a matter of a few hours and still requires O2 so will admit.    Raeford Razor, MD 03/20/15 2139

## 2015-03-20 NOTE — ED Notes (Addendum)
Per EMS, pt stated pt has been drinking alcohol since Thursday. Pt is arousable briefly to painful stimuli, per EMS. Sister visited pt and pt was staggering and slurring his words. Narcan given with no response.

## 2015-03-20 NOTE — ED Notes (Signed)
Patient resting with eyes closed. Answer to his name being called. Patient makes short verbal answers, fall back to sleep.

## 2015-03-20 NOTE — H&P (Signed)
PCP:   Bennie Pierini, FNP   Chief Complaint:  Found down  HPI: 45 yo male h/o etoh abuse, vtach in setting of electrolyte abnormalities, htn brought in by EMS after being found at home by friend/family member.  Pt was minimially responsive on arrival and hypoxic on 6 liter [place].  Over the last several hours he has woken up and become more responsive.  Pt reports he drinks about 1/2 to whole fifth of hard liquor today.  He does not remember what happened today.  He denies any recent illnesses.  But says his wife left him about 4 months ago and he has lost 40 lbs since and report from friends is he has been drinking  More since.  Pt denies any pain.  He is easily arousable, answers questions but with slurred speech.  Tearful when speaking about his wife.  Referred for admission for etoh intoxication, hypoxia and likely aspiration pna.  Review of Systems:  Positive and negative as per HPI otherwise all other systems are negative  Past Medical History: Past Medical History  Diagnosis Date  . Ventricular tachycardia (HCC)     in setting of hypokalemia/ hypomagnesemia and ETOH  . HLD (hyperlipidemia)   . HTN (hypertension)   . B12 deficiency   . Gout   . Hypothyroidism   . Alcohol abuse   . Bronchitis    Past Surgical History  Procedure Laterality Date  . 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 Total hip arthroplasty Right     Medications: Prior to Admission medications   Medication Sig Start Date End Date Taking? Authorizing Provider  CIALIS 20 MG tablet TAKE ONE TABLET BY MOUTH AS NEEDED 01/14/15   Mechele Claude, MD  diphenhydrAMINE (BENADRYL) 25 MG tablet Take 25 mg by mouth every 6 (six) hours as needed for allergies.     Historical Provider, MD  fenofibrate 54 MG tablet Take 1 tablet by mouth  daily 01/28/15   Mary-Margaret Daphine Deutscher, FNP  folic acid (FOLVITE) 1 MG tablet Take 1 tablet (1 mg total) by mouth daily. 10/20/13   Alla German, MD   LORazepam (ATIVAN) 2 MG tablet Take 1 tablet (2 mg total) by mouth daily with breakfast. 01/28/15   Mary-Margaret Daphine Deutscher, FNP  magnesium oxide (MAG-OX) 400 MG tablet Take 1 tablet (400 mg total) by mouth daily. 01/28/15   Mary-Margaret Daphine Deutscher, FNP  metoprolol succinate (TOPROL-XL) 50 MG 24 hr tablet Take 1 tablet by mouth  daily; with, or immediately following a meal 12/23/14   Hillis Range, MD  potassium chloride (K-DUR,KLOR-CON) 10 MEQ tablet Take 1 tablet by mouth  every day 01/28/15   Mary-Margaret Daphine Deutscher, FNP  simvastatin (ZOCOR) 40 MG tablet Take 1 tablet (40 mg total) by mouth daily. 01/28/15   Mary-Margaret Daphine Deutscher, FNP  Vitamin D, Ergocalciferol, (DRISDOL) 50000 UNITS CAPS capsule Take 1 capsule by mouth  once a week 11/03/14   Mechele Claude, MD    Allergies:   Allergies  Allergen Reactions  . Hctz [Hydrochlorothiazide] Rash    Social History:  reports that he quit smoking about 5 years ago. He does not have any smokeless tobacco history on file. He reports that he drinks alcohol. He reports that he does not use illicit drugs.  Family History: Family History  Problem Relation Age of Onset  . Hypertension Father   . Diabetes Maternal Grandmother   . Hypertension Paternal Grandmother   . Stroke Paternal Grandmother  Physical Exam: Filed Vitals:   03/20/15 1930 03/20/15 1945 03/20/15 2000 03/20/15 2015  BP: 112/83  103/83   Pulse: 90 89 102 103  Temp:      TempSrc:      Resp: Height:      Weight:      SpO2: 100% 100% 100% 91%   General appearance: alert, cooperative, no distress and slowed mentation Head: Normocephalic, without obvious abnormality, atraumatic Eyes: negative Nose: Nares normal. Septum midline. Mucosa normal. No drainage or sinus tenderness. Neck: no JVD and supple, symmetrical, trachea midline Lungs: clear to auscultation bilaterally Heart: regular rate and rhythm, S1, S2 normal, no murmur, click, rub or gallop Abdomen: soft, non-tender;  bowel sounds normal; no masses,  no organomegaly Extremities: extremities normal, atraumatic, no cyanosis or edema Pulses: 2+ and symmetric Skin: Skin color, texture, turgor normal. No rashes or lesions Neurologic: Grossly normal   Labs on Admission:   Recent Labs  03/20/15 1735  NA 142  K 3.2*  CL 97*  CO2 29  GLUCOSE 166*  BUN 7  CREATININE 0.95  CALCIUM 8.6*    Recent Labs  03/20/15 1735  AST 77*  ALT 59  ALKPHOS 68  BILITOT 0.6  PROT 7.6  ALBUMIN 4.2    Recent Labs  03/20/15 1735  WBC 6.1  NEUTROABS 3.4  HGB 15.8  HCT 46.0  MCV 104.1*  PLT 175    Radiological Exams on Admission: Ct Head Wo Contrast  03/20/2015  CLINICAL DATA:  45 year old male with altered mental status. Intoxicated. EXAM: CT HEAD WITHOUT CONTRAST TECHNIQUE: Contiguous axial images were obtained from the base of the skull through the vertex without intravenous contrast. COMPARISON:  None. FINDINGS: Atrophy identified. No acute intracranial abnormalities are identified, including mass lesion or mass effect, hydrocephalus, extra-axial fluid collection, midline shift, hemorrhage, or acute infarction. The visualized bony calvarium is unremarkable. IMPRESSION: No evidence of acute intracranial abnormality. Atrophy. Electronically Signed   By: Harmon Pier M.D.   On: 03/20/2015 18:40   Dg Chest Portable 1 View  03/20/2015  CLINICAL DATA:  Intoxicated. Hypoxia. Unresponsive. Possible aspiration. EXAM: PORTABLE CHEST 1 VIEW COMPARISON:  03/08/2006 FINDINGS: Low lung volumes are noted. Pulmonary infiltrate or atelectasis is seen in the medial right lung base. Heart size is within normal limits. No evidence of pneumothorax or definite pleural effusion. IMPRESSION: New low lung volumes with infiltrate or atelectasis in medial right lung base. Electronically Signed   By: Myles Rosenthal M.D.   On: 03/20/2015 19:13   Old record reviewed cxr reviewed no edema rll infiltrate ekg reviewed nsr prolonged qtc, no  acute issues  Assessment/Plan  45 yo male with encephalopathy, hypoxia and etoh intoxication   Principal Problem:   Encephalopathy acute- mostly likely from intoxication with etoh.  Ammonia level mildly elevated.  Seems to be improving with time already.  Treat infection and etoh intoxication see below.  Active Problems:   Alcohol intoxication (HCC)-  Noted.  Place on ciwa protocol.  bananna bag.  Check mag level.   Acute respiratory failure with hypoxia (HCC)-  Likely aspirated, cxr with infiltrate.  Provide with zosyn iv.  Blood and sputum cx ordered.  hiv ordered.  hiv ordered.  Check lactate levels.   Essential hypertension- noted, hold bblocker at this time   h/o VENTRICULAR TACHYCARDIA- in setting of low k and mag. Check mag level.  ekg with prolonged qtc.  Monitor closely in stepdown.  Replete lytes if needed.  Alcohol abuse- noted   Hemochromatosis- noted   QT prolongation- noted, as above  Admit to stepdown.  Pt not really interested in stopping drinking, would readdress tomorrow when more sober.  Full code.  May have component of depression? From recent separation from wife.  Sharryn Belding A 03/20/2015, 8:32 PM

## 2015-03-20 NOTE — ED Notes (Signed)
Per EMS- pt found unresponsive lying on floor by family. Family reported to EMS pt is a heavy drinker and has been on a drinking binge since Thursday. Narcan given in route by EMS with no response.   Upon ED arrival-Pt responding to sternal rub/painful stimuli only. Unable to follow commands, but able to move all extremities. Pt drooling-pt suctioned and head adjusted to support airway. O2 saturation 91%-98% on O2  at 6l.

## 2015-03-20 NOTE — ED Notes (Signed)
Christian King [phone], pt wife (seperated).

## 2015-03-21 DIAGNOSIS — G9340 Encephalopathy, unspecified: Secondary | ICD-10-CM

## 2015-03-21 DIAGNOSIS — J9601 Acute respiratory failure with hypoxia: Secondary | ICD-10-CM

## 2015-03-21 DIAGNOSIS — I10 Essential (primary) hypertension: Secondary | ICD-10-CM

## 2015-03-21 DIAGNOSIS — J690 Pneumonitis due to inhalation of food and vomit: Secondary | ICD-10-CM

## 2015-03-21 DIAGNOSIS — F4323 Adjustment disorder with mixed anxiety and depressed mood: Secondary | ICD-10-CM

## 2015-03-21 DIAGNOSIS — F1010 Alcohol abuse, uncomplicated: Secondary | ICD-10-CM

## 2015-03-21 LAB — CBC WITH DIFFERENTIAL/PLATELET
BASOS ABS: 0 10*3/uL (ref 0.0–0.1)
BASOS PCT: 0 %
EOS ABS: 0 10*3/uL (ref 0.0–0.7)
EOS PCT: 0 %
HCT: 38.5 % — ABNORMAL LOW (ref 39.0–52.0)
Hemoglobin: 13 g/dL (ref 13.0–17.0)
Lymphocytes Relative: 6 %
Lymphs Abs: 0.4 10*3/uL — ABNORMAL LOW (ref 0.7–4.0)
MCH: 35.1 pg — ABNORMAL HIGH (ref 26.0–34.0)
MCHC: 33.8 g/dL (ref 30.0–36.0)
MCV: 104.1 fL — ABNORMAL HIGH (ref 78.0–100.0)
MONO ABS: 0.6 10*3/uL (ref 0.1–1.0)
MONOS PCT: 9 %
NEUTROS ABS: 5.8 10*3/uL (ref 1.7–7.7)
Neutrophils Relative %: 85 %
PLATELETS: 131 10*3/uL — AB (ref 150–400)
RBC: 3.7 MIL/uL — ABNORMAL LOW (ref 4.22–5.81)
RDW: 16.3 % — AB (ref 11.5–15.5)
WBC: 6.9 10*3/uL (ref 4.0–10.5)

## 2015-03-21 LAB — COMPREHENSIVE METABOLIC PANEL
ALBUMIN: 3.4 g/dL — AB (ref 3.5–5.0)
ALT: 45 U/L (ref 17–63)
ANION GAP: 13 (ref 5–15)
AST: 57 U/L — ABNORMAL HIGH (ref 15–41)
Alkaline Phosphatase: 54 U/L (ref 38–126)
BILIRUBIN TOTAL: 0.7 mg/dL (ref 0.3–1.2)
BUN: 8 mg/dL (ref 6–20)
CALCIUM: 7.5 mg/dL — AB (ref 8.9–10.3)
CO2: 27 mmol/L (ref 22–32)
CREATININE: 0.65 mg/dL (ref 0.61–1.24)
Chloride: 101 mmol/L (ref 101–111)
GFR calc Af Amer: >60 mL/min (ref >60)
GFR calc non Af Amer: >60 mL/min (ref >60)
GLUCOSE: 130 mg/dL — AB (ref 65–99)
Potassium: 3.1 mmol/L — ABNORMAL LOW (ref 3.5–5.1)
SODIUM: 141 mmol/L (ref 135–145)
TOTAL PROTEIN: 6.2 g/dL — AB (ref 6.5–8.1)

## 2015-03-21 LAB — MRSA PCR SCREENING: MRSA BY PCR: NEGATIVE

## 2015-03-21 LAB — STREP PNEUMONIAE URINARY ANTIGEN: Strep Pneumo Urinary Antigen: POSITIVE — AB

## 2015-03-21 MED ORDER — ENSURE ENLIVE PO LIQD: 237.0000 mL | Freq: Two times a day (BID) | ORAL | Status: DC

## 2015-03-21 MED ORDER — SODIUM CHLORIDE 0.9% FLUSH: 3.0000 mL | INTRAVENOUS | Status: DC | PRN

## 2015-03-21 MED ORDER — MAGNESIUM SULFATE 2 GM/50ML IV SOLN
2.0000 g | Freq: Once | INTRAVENOUS | Status: AC
  Administered 2015-03-21: 2 g via INTRAVENOUS
  Filled 2015-03-21: qty 50

## 2015-03-21 MED ORDER — METOPROLOL SUCCINATE ER 50 MG PO TB24
50.0000 mg | ORAL_TABLET | Freq: Every day | ORAL | Status: DC
  Administered 2015-03-21 – 2015-03-22 (×2): 50 mg via ORAL
  Filled 2015-03-21 (×2): qty 1

## 2015-03-21 MED ORDER — AMOXICILLIN-POT CLAVULANATE 875-125 MG PO TABS
1.0000 | ORAL_TABLET | Freq: Two times a day (BID) | ORAL | Status: DC
  Administered 2015-03-21 – 2015-03-22 (×3): 1 via ORAL
  Filled 2015-03-21 (×7): qty 1

## 2015-03-21 MED ORDER — PANTOPRAZOLE SODIUM 40 MG IV SOLR
40.0000 mg | Freq: Two times a day (BID) | INTRAVENOUS | Status: DC
  Administered 2015-03-21 – 2015-03-22 (×3): 40 mg via INTRAVENOUS
  Filled 2015-03-21 (×3): qty 40

## 2015-03-21 MED ORDER — POTASSIUM CHLORIDE CRYS ER 20 MEQ PO TBCR: 40.0000 meq | EXTENDED_RELEASE_TABLET | ORAL | Status: DC

## 2015-03-21 MED ORDER — POTASSIUM CHLORIDE CRYS ER 20 MEQ PO TBCR
40.0000 meq | EXTENDED_RELEASE_TABLET | ORAL | Status: AC
  Administered 2015-03-21 (×2): 40 meq via ORAL
  Filled 2015-03-21 (×2): qty 2

## 2015-03-21 MED ORDER — SODIUM CHLORIDE 0.9% FLUSH
3.0000 mL | Freq: Two times a day (BID) | INTRAVENOUS | Status: DC
  Administered 2015-03-21 – 2015-03-22 (×2): 3 mL via INTRAVENOUS

## 2015-03-21 MED ORDER — SODIUM CHLORIDE 0.9 % IV SOLN: 250.0000 mL | INTRAVENOUS | Status: DC | PRN

## 2015-03-21 NOTE — Care Management Note (Signed)
Case Management Note  Patient Details  Name: Christian King MRN: [id number] Date of Birth: Aug 24, 1970  Subjective/Objective:                  Pt is from home, lives alone and is ind with ADL's. Pt has had TTS referral, inpt psych not recommended. Pt plans to return home with self care.   Action/Plan: No CM needs.   Expected Discharge Date:    03/22/2015              Expected Discharge Plan:  Home/Self Care  In-House Referral:  NA  Discharge planning Services  CM Consult  Post Acute Care Choice:  NA Choice offered to:  NA  DME Arranged:    DME Agency:     HH Arranged:    HH Agency:     Status of Service:  Completed, signed off  Medicare Important Message Given:    Date Medicare IM Given:    Medicare IM give by:    Date Additional Medicare IM Given:    Additional Medicare Important Message give by:     If discussed at Long Length of Stay Meetings, dates discussed:    Additional Comments:  Malcolm Metro, RN 03/21/2015, 3:25 PM

## 2015-03-21 NOTE — Progress Notes (Signed)
Pt a/o.vss. IV patent. No complaints of any distress. Report given to Leotis Shames, RN. Pt to be transferred to room 333 via wheelchair with nursing staff.

## 2015-03-21 NOTE — Consult Note (Signed)
Telepsych Consultation   Reason for Consult: Medication recommendations Referring Physician:  Forestine Na EDP  Patient Identification: Christian King MRN:  [id number] Principal Diagnosis: Alcohol abuse Diagnosis:   Patient Active Problem List   Diagnosis Date Noted  . Adjustment disorder with mixed anxiety and depressed mood [F43.23] 03/21/2015  . Alcohol intoxication ([place]) [F10.129] 03/20/2015  . Acute respiratory failure with hypoxia ([place]) [J96.01] 03/20/2015  . Encephalopathy acute [G93.40] 03/20/2015  . QT prolongation [I45.81] 03/20/2015  . BMI 25.0-25.9,adult [Z68.25] 01/28/2015  . Insomnia [G47.00] 02/05/2014  . Hemochromatosis [E83.119] 10/03/2013  . Alcohol abuse [F10.10] 07/24/2010  . Hypothyroidism [E03.9] 04/26/2010  . B12 DEFICIENCY [E53.8] 04/26/2010  . Hyperlipidemia [E78.5] 04/26/2010  . Gout [M10.9] 04/26/2010  . Essential hypertension [I10] 04/26/2010  . h/o VENTRICULAR TACHYCARDIA [I47.2] 04/26/2010    Total Time spent with patient: 45 minutes  Subjective:   Christian King is a 45 y.o. male patient admitted with alcohol intoxication after being found by his cousin staggering and slurring his words. He was brought into [facility] via EMS. Patient states "I was at [facility] last January 2016. I started slipping a little bit last April and my wife got mad. I was sneaking some alcohol here and there. Then in October my wife abruptly told me she wanted a divorce. I thought things were good. We had been on vacation and at one point she even thought that she was pregnant. I was shocked. I guess I did start drinking more. I had some ativan that was started in 2005 that I was using to help me sleep. I was never suicidal and have never attempted. I don't think that I need rehab again. I am worried about money and how I will pay for this. I was told that ativan is like taking a shot of alcohol. I know it's dangerous to mix them. After I left [facility]  I was given Trazodone and Neurontin for sleep. But I had not way of getting them refilled."    HPI:    Christian King is a 45 year old male with a past history of alcohol dependence who presented to [facility] after being found intoxicated by his cousin. Patient reports that his mood was doing fine until his wife left him in October 2016. He admits to increased alcohol usage since that time. Patient denies any suicidal or homicidal ideation. His CIWA scores have decreased to a five this morning since being started on an Ativan taper. Patient's alcohol level was extremely high on admission being 588 and he reports drinking too much at a friend's house yesterday. His vital signs are stable and there are no obvious symptoms of withdrawal. The patient denies any history of seizures or DT's. Patient is requesting outpatient follow up due to his current financial stressors. Patient is alert and oriented. Christian King is fully cooperative with the assessment. He realizes that his current drinking is related to stress from his wife leaving him. Patient reported a sense of hope for the future and talked about going to trucking school. He mentions that many friends and family have showed increased emotional support in response to the recent breakup of his marriage.   HPI Elements:   Location:  alcohol intoxication . Quality:  Blackout out after excessively drinking yesterday . Severity:  Severe. Timing:  Drinking more last two months. Duration:  Acute . Context:  Started when wife left him in October 2016, financial problems .  Past Medical History:  Past  Medical History  Diagnosis Date  . Ventricular tachycardia ([place])     in setting of hypokalemia/ hypomagnesemia and ETOH  . HLD (hyperlipidemia)   . HTN (hypertension)   . B12 deficiency   . Gout   . Hypothyroidism   . Alcohol abuse   . Bronchitis     Past Surgical History  Procedure Laterality Date  . Vascularized fibular graft      right hip for  avascular necrosis  . Total hip arthroplasty Right    Family History:  Family History  Problem Relation Age of Onset  . Hypertension Father   . Diabetes Maternal Grandmother   . Hypertension Paternal Grandmother   . Stroke Paternal Grandmother    Social History:  History  Alcohol Use  . Yes    Comment: drinking since Thursday     History  Drug Use No    Social History   Social History  . Marital Status: Married    Spouse Name: N/A  . Number of Children: N/A  . Years of Education: N/A   Social History Main Topics  . Smoking status: Former Smoker -- 0.20 packs/day    Quit date: 02/19/2010  . Smokeless tobacco: None  . Alcohol Use: Yes     Comment: drinking since Thursday  . Drug Use: No  . Sexual Activity: Yes   Other Topics Concern  . None   Social History Narrative   Unemployed.  Previously a Estate agent Social History:                          Allergies:   Allergies  Allergen Reactions  . Hctz [Hydrochlorothiazide] Rash    Labs:  Results for orders placed or performed during the hospital encounter of 03/20/15 (from the past 48 hour(s))  Urinalysis, Routine w reflex microscopic (not at [facility])     Status: Abnormal   Collection Time: 03/20/15 11:08 AM  Result Value Ref Range   Color, Urine YELLOW YELLOW   APPearance CLEAR CLEAR   Specific Gravity, Urine >1.030 (H) 1.005 - 1.030   pH 5.5 5.0 - 8.0   Glucose, UA 100 (A) NEGATIVE mg/dL   Hgb urine dipstick SMALL (A) NEGATIVE   Bilirubin Urine NEGATIVE NEGATIVE   Ketones, ur NEGATIVE NEGATIVE mg/dL   Protein, ur 100 (A) NEGATIVE mg/dL   Nitrite NEGATIVE NEGATIVE   Leukocytes, UA NEGATIVE NEGATIVE  Urine rapid drug screen (hosp performed)     Status: Abnormal   Collection Time: 03/20/15 11:08 AM  Result Value Ref Range   Opiates NONE DETECTED NONE DETECTED   Cocaine NONE DETECTED NONE DETECTED   Benzodiazepines POSITIVE (A) NONE DETECTED   Amphetamines NONE  DETECTED NONE DETECTED   Tetrahydrocannabinol NONE DETECTED NONE DETECTED   Barbiturates NONE DETECTED NONE DETECTED    Comment:        DRUG SCREEN FOR MEDICAL PURPOSES ONLY.  IF CONFIRMATION IS NEEDED FOR ANY PURPOSE, NOTIFY LAB WITHIN 5 DAYS.        LOWEST DETECTABLE LIMITS FOR URINE DRUG SCREEN Drug Class       Cutoff (ng/mL) Amphetamine      1000 Barbiturate      200 Benzodiazepine   482 Tricyclics       707 Opiates          300 Cocaine          300 THC  50   Urine microscopic-add on     Status: Abnormal   Collection Time: 03/20/15 11:08 AM  Result Value Ref Range   Squamous Epithelial / LPF 0-5 (A) NONE SEEN   WBC, UA 0-5 0 - 5 WBC/hpf   RBC / HPF 0-5 0 - 5 RBC/hpf   Bacteria, UA RARE (A) NONE SEEN  CBG monitoring, ED     Status: Abnormal   Collection Time: 03/20/15  5:28 PM  Result Value Ref Range   Glucose-Capillary 153 (H) 65 - 99 mg/dL  CBC with Differential     Status: Abnormal   Collection Time: 03/20/15  5:35 PM  Result Value Ref Range   WBC 6.1 4.0 - 10.5 K/uL   RBC 4.42 4.22 - 5.81 MIL/uL   Hemoglobin 15.8 13.0 - 17.0 g/dL   HCT 46.0 39.0 - 52.0 %   MCV 104.1 (H) 78.0 - 100.0 fL   MCH 35.7 (H) 26.0 - 34.0 pg   MCHC 34.3 30.0 - 36.0 g/dL   RDW 16.1 (H) 11.5 - 15.5 %   Platelets 175 150 - 400 K/uL   Neutrophils Relative % 55 %   Neutro Abs 3.4 1.7 - 7.7 K/uL   Lymphocytes Relative 32 %   Lymphs Abs 2.0 0.7 - 4.0 K/uL   Monocytes Relative 11 %   Monocytes Absolute 0.7 0.1 - 1.0 K/uL   Eosinophils Relative 1 %   Eosinophils Absolute 0.1 0.0 - 0.7 K/uL   Basophils Relative 1 %   Basophils Absolute 0.1 0.0 - 0.1 K/uL  Basic metabolic panel     Status: Abnormal   Collection Time: 03/20/15  5:35 PM  Result Value Ref Range   Sodium 142 135 - 145 mmol/L   Potassium 3.2 (L) 3.5 - 5.1 mmol/L   Chloride 97 (L) 101 - 111 mmol/L   CO2 29 22 - 32 mmol/L   Glucose, Bld 166 (H) 65 - 99 mg/dL   BUN 7 6 - 20 mg/dL   Creatinine, Ser 0.95 0.61 - 1.24  mg/dL   Calcium 8.6 (L) 8.9 - 10.3 mg/dL   GFR calc non Af Amer >60 >60 mL/min   GFR calc Af Amer >60 >60 mL/min    Comment: (NOTE) The eGFR has been calculated using the CKD EPI equation. This calculation has not been validated in all clinical situations. eGFR's persistently <60 mL/min signify possible Chronic Kidney Disease.    Anion gap 16 (H) 5 - 15  Ethanol     Status: Abnormal   Collection Time: 03/20/15  5:35 PM  Result Value Ref Range   Alcohol, Ethyl (B) 588 (HH) <5 mg/dL    Comment:        LOWEST DETECTABLE LIMIT FOR SERUM ALCOHOL IS 5 mg/dL FOR MEDICAL PURPOSES ONLY CRITICAL RESULT CALLED TO, READ BACK BY AND VERIFIED WITH: WATKINS,P. AT 1920 ON 03/20/2015 BY AGUNDIZ,E.   Hepatic function panel     Status: Abnormal   Collection Time: 03/20/15  5:35 PM  Result Value Ref Range   Total Protein 7.6 6.5 - 8.1 g/dL   Albumin 4.2 3.5 - 5.0 g/dL   AST 77 (H) 15 - 41 U/L   ALT 59 17 - 63 U/L   Alkaline Phosphatase 68 38 - 126 U/L   Total Bilirubin 0.6 0.3 - 1.2 mg/dL   Bilirubin, Direct 0.2 0.1 - 0.5 mg/dL   Indirect Bilirubin 0.4 0.3 - 0.9 mg/dL  Ammonia     Status: Abnormal   Collection  Time: 03/20/15  6:50 PM  Result Value Ref Range   Ammonia 38 (H) 9 - 35 umol/L  Troponin I     Status: None   Collection Time: 03/20/15  8:01 PM  Result Value Ref Range   Troponin I <0.03 <0.031 ng/mL    Comment:        NO INDICATION OF MYOCARDIAL INJURY.   Lactic acid, plasma     Status: Abnormal   Collection Time: 03/20/15  8:01 PM  Result Value Ref Range   Lactic Acid, Venous 4.1 (HH) 0.5 - 2.0 mmol/L    Comment: CRITICAL RESULT CALLED TO, READ BACK BY AND VERIFIED WITH: DANIELS,J. AT 2106 ON 03/20/2015 BY AGUNDIZ,E.    Magnesium     Status: Abnormal   Collection Time: 03/20/15  8:01 PM  Result Value Ref Range   Magnesium 1.6 (L) 1.7 - 2.4 mg/dL  Blood gas, arterial ([facility] & [facility] ONLY)     Status: Abnormal   Collection Time: 03/20/15  8:29 PM  Result Value Ref Range   FIO2  0.44    Delivery systems NASAL CANNULA    pH, Arterial 7.363 7.350 - 7.450   pCO2 arterial 46.7 (H) 35.0 - 45.0 mmHg   pO2, Arterial 143 (H) 80.0 - 100.0 mmHg   Bicarbonate 24.9 (H) 20.0 - 24.0 mEq/L   Acid-Base Excess 1.1 0.0 - 2.0 mmol/L   O2 Saturation 98.4 %   Patient temperature 37.0    Collection site RIGHT RADIAL    Drawn by [id number]    Sample type ARTERIAL DRAW    Allens test (pass/fail) PASS PASS  MRSA PCR Screening     Status: None   Collection Time: 03/20/15  8:41 PM  Result Value Ref Range   MRSA by PCR NEGATIVE NEGATIVE    Comment:        The GeneXpert MRSA Assay (FDA approved for NASAL specimens only), is one component of a comprehensive MRSA colonization surveillance program. It is not intended to diagnose MRSA infection nor to guide or monitor treatment for MRSA infections.   Culture, blood (routine x 2) Call MD if unable to obtain prior to antibiotics being given     Status: None (Preliminary result)   Collection Time: 03/20/15  9:24 PM  Result Value Ref Range   Specimen Description BLOOD RIGHT ARM    Special Requests BOTTLES DRAWN AEROBIC AND ANAEROBIC 4CC    Culture NO GROWTH < 12 HOURS    Report Status PENDING   Culture, blood (routine x 2) Call MD if unable to obtain prior to antibiotics being given     Status: None (Preliminary result)   Collection Time: 03/20/15  9:34 PM  Result Value Ref Range   Specimen Description BLOOD RIGHT HAND    Special Requests BOTTLES DRAWN AEROBIC AND ANAEROBIC 4CC EACH    Culture NO GROWTH < 12 HOURS    Report Status PENDING   Lactic acid, plasma     Status: Abnormal   Collection Time: 03/20/15 10:53 PM  Result Value Ref Range   Lactic Acid, Venous 3.5 (HH) 0.5 - 2.0 mmol/L    Comment: CRITICAL RESULT CALLED TO, READ BACK BY AND VERIFIED WITH: MIZE,J AT 1128 ON 03/20/2015 BY AGUNDIE,E.   Comprehensive metabolic panel     Status: Abnormal   Collection Time: 03/21/15  4:33 AM  Result Value Ref Range   Sodium 141 135 -  145 mmol/L   Potassium 3.1 (L) 3.5 - 5.1 mmol/L   Chloride  101 101 - 111 mmol/L   CO2 27 22 - 32 mmol/L   Glucose, Bld 130 (H) 65 - 99 mg/dL   BUN 8 6 - 20 mg/dL   Creatinine, Ser 0.65 0.61 - 1.24 mg/dL   Calcium 7.5 (L) 8.9 - 10.3 mg/dL   Total Protein 6.2 (L) 6.5 - 8.1 g/dL   Albumin 3.4 (L) 3.5 - 5.0 g/dL   AST 57 (H) 15 - 41 U/L   ALT 45 17 - 63 U/L   Alkaline Phosphatase 54 38 - 126 U/L   Total Bilirubin 0.7 0.3 - 1.2 mg/dL   GFR calc non Af Amer >60 >60 mL/min   GFR calc Af Amer >60 >60 mL/min    Comment: (NOTE) The eGFR has been calculated using the CKD EPI equation. This calculation has not been validated in all clinical situations. eGFR's persistently <60 mL/min signify possible Chronic Kidney Disease.    Anion gap 13 5 - 15  CBC WITH DIFFERENTIAL     Status: Abnormal   Collection Time: 03/21/15  4:33 AM  Result Value Ref Range   WBC 6.9 4.0 - 10.5 K/uL   RBC 3.70 (L) 4.22 - 5.81 MIL/uL   Hemoglobin 13.0 13.0 - 17.0 g/dL   HCT 38.5 (L) 39.0 - 52.0 %   MCV 104.1 (H) 78.0 - 100.0 fL   MCH 35.1 (H) 26.0 - 34.0 pg   MCHC 33.8 30.0 - 36.0 g/dL   RDW 16.3 (H) 11.5 - 15.5 %   Platelets 131 (L) 150 - 400 K/uL   Neutrophils Relative % 85 %   Neutro Abs 5.8 1.7 - 7.7 K/uL   Lymphocytes Relative 6 %   Lymphs Abs 0.4 (L) 0.7 - 4.0 K/uL   Monocytes Relative 9 %   Monocytes Absolute 0.6 0.1 - 1.0 K/uL   Eosinophils Relative 0 %   Eosinophils Absolute 0.0 0.0 - 0.7 K/uL   Basophils Relative 0 %   Basophils Absolute 0.0 0.0 - 0.1 K/uL    Vitals: Blood pressure 120/81, pulse 95, temperature 97.9 F (36.6 C), temperature source Oral, resp. rate 21, height '5\' 7"'  (1.702 m), weight 76.2 kg (167 lb 15.9 oz), SpO2 96 %.  Risk to Self: Is patient at risk for suicide?: No Risk to Others:   Prior Inpatient Therapy:   Prior Outpatient Therapy:    Current Facility-Administered Medications  Medication Dose Route Frequency Provider Last Rate Last Dose  . 0.9 %  sodium chloride  infusion   Intravenous Continuous Virgel Manifold, MD 100 mL/hr at 03/20/15 2202    . amoxicillin-clavulanate (AUGMENTIN) 875-125 MG per tablet 1 tablet  1 tablet Oral Q12H Kathie Dike, MD   1 tablet at 03/21/15 0959  . enoxaparin (LOVENOX) injection 40 mg  40 mg Subcutaneous Q24H Phillips Grout, MD   40 mg at 03/20/15 2212  . folic acid (FOLVITE) tablet 1 mg  1 mg Oral Daily Phillips Grout, MD   1 mg at 03/21/15 0900  . LORazepam (ATIVAN) injection 0-4 mg  0-4 mg Intravenous Q6H Phillips Grout, MD   2 mg at 03/21/15 0945   Followed by  . [START ON 03/22/2015] LORazepam (ATIVAN) injection 0-4 mg  0-4 mg Intravenous Q12H Phillips Grout, MD      . LORazepam (ATIVAN) tablet 1 mg  1 mg Oral Q6H PRN Phillips Grout, MD       Or  . LORazepam (ATIVAN) injection 1 mg  1 mg Intravenous Q6H PRN  Phillips Grout, MD   1 mg at 03/20/15 2212  . metoprolol succinate (TOPROL-XL) 24 hr tablet 50 mg  50 mg Oral Daily Kathie Dike, MD   50 mg at 03/21/15 0948  . multivitamin with minerals tablet 1 tablet  1 tablet Oral Daily Phillips Grout, MD   1 tablet at 03/21/15 0900  . pantoprazole (PROTONIX) injection 40 mg  40 mg Intravenous Q12H Kathie Dike, MD   40 mg at 03/21/15 0945  . phenol (CHLORASEPTIC) mouth spray 1 spray  1 spray Mouth/Throat PRN Pollie Friar, PA-C   1 spray at 03/20/15 2351  . pneumococcal 23 valent vaccine (PNU-IMMUNE) injection 0.5 mL  0.5 mL Intramuscular Tomorrow-1000 Phillips Grout, MD   0.5 mL at 03/21/15 1000  . potassium chloride SA (K-DUR,KLOR-CON) CR tablet 40 mEq  40 mEq Oral Q3H Kathie Dike, MD   40 mEq at 03/21/15 1140  . thiamine (VITAMIN B-1) tablet 100 mg  100 mg Oral Daily Phillips Grout, MD   100 mg at 03/21/15 0900   Or  . thiamine (B-1) injection 100 mg  100 mg Intravenous Daily Phillips Grout, MD        Musculoskeletal: Strength & Muscle Tone: within normal limits Gait & Station: normal Patient leans: N/A  Psychiatric Specialty Exam: Physical Exam  ROS  Blood  pressure 120/81, pulse 95, temperature 97.9 F (36.6 C), temperature source Oral, resp. rate 21, height '5\' 7"'  (1.702 m), weight 76.2 kg (167 lb 15.9 oz), SpO2 96 %.Body mass index is 26.3 kg/(m^2).  General Appearance: Disheveled  Eye Contact::  Good  Speech:  Clear and Coherent  Volume:  Normal  Mood:  Depressed  Affect:  Congruent  Thought Process:  Goal Directed and Intact  Orientation:  Full (Time, Place, and Person)  Thought Content:  Symptoms, worries, concerns  Suicidal Thoughts:  No  Homicidal Thoughts:  No  Memory:  Immediate;   Good Recent;   Good Remote;   Good  Judgement:  Impaired  Insight:  Present  Psychomotor Activity:  Normal  Concentration:  Good  Recall:  Good  Fund of Knowledge:Good  Language: Good  Akathisia:  No  Handed:  Right  AIMS (if indicated):     Assets:  Communication Skills Desire for Improvement Leisure Time Physical Health Resilience Social Support Talents/Skills Transportation  ADL's:  Intact  Cognition: WNL  Sleep:      Medical Decision Making: Review of Psycho-Social Stressors (1), Review or order clinical lab tests (1) and Established Problem, Worsening (2)  Plan:  No evidence of imminent risk to self or others at present.   Patient does not meet criteria for psychiatric inpatient admission. Supportive therapy provided about ongoing stressors. Disposition:   -Discussed case with Dr. Dwyane Dee. Patient is not interested in inpatient rehab at this time. He is requesting to be discharged home with outpatient resources for medication management. Patient is currently denying and suicidal thoughts and appears motivated to obtain help with his alcohol use on an outpatient basis. He was advised to discontinue use of ativan due to his substance abuse history.   -Would recommend starting Neurontin 100 mg BID for anxiety and prevention of seizures.  -Recommend Vistaril 50 mg hs prn to help with symptoms of insomnia.   Elmarie Shiley,  NP-C 03/21/2015 11:46 AM

## 2015-03-21 NOTE — Progress Notes (Signed)
Initial Nutrition Assessment  INTERVENTION:   Provide Ensure Enlive po BID, each supplement provides 350 kcal and 20 grams of protein.   NUTRITION DIAGNOSIS:   Inadequate oral intake related to vomiting, poor appetite as evidenced by per patient/family report.  GOAL:   Patient will meet greater than or equal to 90% of their needs  MONITOR:   PO intake, Supplement acceptance, Labs, Weight trends  REASON FOR ASSESSMENT:   Malnutrition Screening Tool   ASSESSMENT:   45 yo male h/o etoh abuse, vtach in setting of electrolyte abnormalities, htn brought in by EMS after being found at home by friend/family member. Pt reports he drank about 1/2 to whole fifth of hard liquor today.  Referred for admission for etoh intoxication, hypoxia and likely aspiration pna.   Visited patient at bedside.  Patient reports no current appetite but is thirsty.  Reports vomiting up blood yesterday evening.  Pt reports that his appetite has been in decline since the summer time when he separated from his wife.  He reports a usual body weight to be 190 lbs and a 35 lb weight loss since this time; however, no weight history from chart to verify this weight change.  Weight history in chart shows around 165 - 175 lbs to be normal weight.   Patient reports that he used to cook dinner for wife and son until wife was working later hours and son got a girlfriend, so he stopped cooking just for himself.  Reports a typical breakfast includes a muffin and milk, nothing consumed for lunch, and "whatever is in the fridge" for dinner.  While visiting with patient a tray was delivered containing chicken and gravy, potato wedges, steamed carrots, roll, and tea.  Patient did not feel that he could handle the foods; however, he drank almost all of the tea while RD was present.  Patient stated he would like to try some jello.  Nutrition Focused Physical Exam was conducted.  Findings were no significant fat or muscle depletions and  no edema.   Pt reports that liquids are easier for him to consume than food due to a sore throat from vomiting over the past 24 hours.  Patient was amenable to Ensure Enlive, which provides 350 kcal and 20 grams of protein per supplement.  RD discussed the importance of adequate nutrition to help patient feel better.    Diet Order:  Diet regular Room service appropriate?: Yes; Fluid consistency:: Thin  Skin:  Reviewed, no issues  Last BM:  PTA  Height:   Ht Readings from Last 1 Encounters:  03/20/15  (1.702 m)   Weight:   Wt Readings from Last 1 Encounters:  03/21/15 167 lb 15.9 oz (76.2 kg)   Ideal Body Weight:  67.3 kg  BMI:  Body mass index is 26.3 kg/(m^2).  Estimated Nutritional Needs:   Kcal:  2000-2200  Protein:  90-100 grams   Fluid:  2-2.2 L  EDUCATION NEEDS:   No education needs identified at this time  Doroteo Glassman, Dietetic Intern Pager: [phone]

## 2015-03-21 NOTE — Progress Notes (Signed)
[facility] PROGRESS NOTE  Christian King:[id number] DOB: [birth year] DOA: 03/20/2015 PCP: Bennie Pierini, FNP  Assessment/Plan: Acute encephalopathy, secondary to EtOH intoxication. It was reported that he was found home lying on the ground and difficult to arouse. Resolved with improvement in EtOH levels.  Acute respiratory failure with hypoxia, resolved. On arrival required 6L [place]. Will discontinue [place] and monitor. Appears to be breathing comfortably on room air.  Aspiration pneumonia, likely due to recent fall and LOC, revealed on CXR. Started on IV Zosyn. HIV, blood and sputum culture in process. Lactic acid 3.5 on admission. Since he does not appear septic or toxic and is afebrile will discontinue Zosyn and start a course of Augmentin.   Alcohol intoxication, placed on CIWA protocol.    Essential HTN. Blood pressure has stabilized will restart metoprolol.  H/o ventricular tachycardia. Will monitor in the setting of low potassium and mag. EKG with prolong QTC. Electrolytes will be replaced and repeat EKG. .  Alcohol abuse. Counseled on cessation, has tried rehab last year.  Tobacco use. Recently restarted. Counseled on importance of cessation.  Hemochromatosis. Noted Possible depression. Noted to have separated  from his wife and has been drinking heavily since. With his ongoing depression, EtOH abuse, and benzo use will consult psychiatry to see if he is a candidate for inpatient rehab.  Nausea and vomiting. Patient had episode of vomiting last night that appeared to be coffee ground appearing. May have some element of alcoholic gastritis. Will start on PPI BID. Follow hemoglobin. If emesis persists, can consider GI input. Hemoglobin is stable.  Code Status: Full DVT prophylaxis: SCDs Family Communication: No family bedside. Discussed with patient who understands and has no concerns at this time. Disposition Plan: Anticipate discharge within 24 hours.     Consultants:  None  Procedures:  None  Antibiotics:  Zosyn 1/29>>1/30  Augmentin 1/30>>  HPI/Subjective: Doing well. Breathing is fine. Denies cough since admission. Recent separation from wife and subsequent issues caused increased drinking. Drinks a pint a day and has restarted smoking. Increased sleeping and depression. Has SI but doesn't think he has the courage and is too religious. Has tried rehab last year with only a month long success.  When going a few days without drinking he admits shaking unless he takes Ativan.   Objective: Filed Vitals:   03/21/15 0500 03/21/15 0600  BP: 95/70 105/76  Pulse: 85 84  Temp:    Resp: 20 19    Intake/Output Summary (Last 24 hours) at 03/21/15 0702 Last data filed at 03/21/15 0400  Gross per 24 hour  Intake      0 ml  Output   1600 ml  Net  -1600 ml   Filed Weights   03/20/15 1735 03/20/15 2100 03/21/15 0500  Weight: 72.576 kg (160 lb) 76.2 kg (167 lb 15.9 oz) 76.2 kg (167 lb 15.9 oz)    Exam: General: NAD, looks comfortable. Does not appear intoxicated or in withdrawal. Cardiovascular: RRR, S1, S2  Respiratory: clear bilaterally, No wheezing, rales or rhonchi Abdomen: soft, non tender, no distention , bowel sounds normal Musculoskeletal: No edema b/l  Data Reviewed: Basic Metabolic Panel:  Recent Labs Lab 03/20/15 1735 03/20/15 2001 03/21/15 0433  NA 142  --  141  K 3.2*  --  3.1*  CL 97*  --  101  CO2 29  --  27  GLUCOSE 166*  --  130*  BUN 7  --  8  CREATININE 0.95  --  0.65  CALCIUM 8.6*  --  7.5*  MG  --  1.6*  --    Liver Function Tests:  Recent Labs Lab 03/20/15 1735 03/21/15 0433  AST 77* 57*  ALT 59 45  ALKPHOS 68 54  BILITOT 0.6 0.7  PROT 7.6 6.2*  ALBUMIN 4.2 3.4*   Recent Labs Lab 03/20/15 1850  AMMONIA 38*   CBC:  Recent Labs Lab 03/20/15 1735 03/21/15 0433  WBC 6.1 6.9  NEUTROABS 3.4 5.8  HGB 15.8 13.0  HCT 46.0 38.5*  MCV 104.1* 104.1*  PLT 175 131*   Cardiac  Enzymes:  Recent Labs Lab 03/20/15 2001  TROPONINI <0.03   CBG:  Recent Labs Lab 03/20/15 1728  GLUCAP 153*    Recent Results (from the past 240 hour(s))  MRSA PCR Screening     Status: None   Collection Time: 03/20/15  8:41 PM  Result Value Ref Range Status   MRSA by PCR NEGATIVE NEGATIVE Final    Comment:        The GeneXpert MRSA Assay (FDA approved for NASAL specimens only), is one component of a comprehensive MRSA colonization surveillance program. It is not intended to diagnose MRSA infection nor to guide or monitor treatment for MRSA infections.   Culture, blood (routine x 2) Call MD if unable to obtain prior to antibiotics being given     Status: None (Preliminary result)   Collection Time: 03/20/15  9:24 PM  Result Value Ref Range Status   Specimen Description BLOOD RIGHT ARM  Final   Special Requests BOTTLES DRAWN AEROBIC AND ANAEROBIC 4CC  Final   Culture PENDING  Incomplete   Report Status PENDING  Incomplete  Culture, blood (routine x 2) Call MD if unable to obtain prior to antibiotics being given     Status: None (Preliminary result)   Collection Time: 03/20/15  9:34 PM  Result Value Ref Range Status   Specimen Description BLOOD RIGHT HAND  Final   Special Requests BOTTLES DRAWN AEROBIC AND ANAEROBIC 4CC EACH  Final   Culture PENDING  Incomplete   Report Status PENDING  Incomplete     Studies: Ct Head Wo Contrast  03/20/2015  CLINICAL DATA:  45 year old male with altered mental status. Intoxicated. EXAM: CT HEAD WITHOUT CONTRAST TECHNIQUE: Contiguous axial images were obtained from the base of the skull through the vertex without intravenous contrast. COMPARISON:  None. FINDINGS: Atrophy identified. No acute intracranial abnormalities are identified, including mass lesion or mass effect, hydrocephalus, extra-axial fluid collection, midline shift, hemorrhage, or acute infarction. The visualized bony calvarium is unremarkable. IMPRESSION: No evidence of  acute intracranial abnormality. Atrophy. Electronically Signed   By: Harmon Pier M.D.   On: 03/20/2015 18:40   Dg Chest Portable 1 View  03/20/2015  CLINICAL DATA:  Intoxicated. Hypoxia. Unresponsive. Possible aspiration. EXAM: PORTABLE CHEST 1 VIEW COMPARISON:  03/08/2006 FINDINGS: Low lung volumes are noted. Pulmonary infiltrate or atelectasis is seen in the medial right lung base. Heart size is within normal limits. No evidence of pneumothorax or definite pleural effusion. IMPRESSION: New low lung volumes with infiltrate or atelectasis in medial right lung base. Electronically Signed   By: Myles Rosenthal M.D.   On: 03/20/2015 19:13    Scheduled Meds: . enoxaparin (LOVENOX) injection  40 mg Subcutaneous Q24H  . folic acid  1 mg Oral Daily  . LORazepam  0-4 mg Intravenous Q6H   Followed by  . [START ON 03/22/2015] LORazepam  0-4 mg Intravenous Q12H  . multivitamin with minerals  1 tablet Oral Daily  . piperacillin-tazobactam (ZOSYN)  IV  3.375 g Intravenous Q8H  . pneumococcal 23 valent vaccine  0.5 mL Intramuscular Tomorrow-1000  . potassium chloride  20 mEq Oral BID  . thiamine  100 mg Oral Daily   Or  . thiamine  100 mg Intravenous Daily   Continuous Infusions: . sodium chloride 100 mL/hr at 03/20/15 2202    Principal Problem:   Encephalopathy acute Active Problems:   Essential hypertension   h/o VENTRICULAR TACHYCARDIA   Alcohol abuse   Hemochromatosis   Alcohol intoxication (HCC)   Acute respiratory failure with hypoxia (HCC)   QT prolongation    Time spent: 35 minutes.     Erick Blinks, MD  [facility] Pager [phone]. If 7PM-7AM, please contact night-coverage at [email or web address], password [facility] 03/21/2015, 7:02 AM  LOS: 1 day     By signing my name below, I, Zadie Cleverly, attest that this documentation has been prepared under the direction and in the presence of Erick Blinks, MD. Electronically signed: Zadie Cleverly, Scribe. 03/21/2015 9:12am   I,  Dr. Erick Blinks, personally performed the services described in this documentaiton. All medical record entries made by the scribe were at my direction and in my presence. I have reviewed the chart and agree that the record reflects my personal performance and is accurate and complete  Erick Blinks, MD, 03/21/2015 9:37 AM

## 2015-03-22 DIAGNOSIS — J13 Pneumonia due to Streptococcus pneumoniae: Principal | ICD-10-CM

## 2015-03-22 DIAGNOSIS — F10120 Alcohol abuse with intoxication, uncomplicated: Secondary | ICD-10-CM

## 2015-03-22 LAB — BASIC METABOLIC PANEL
Anion gap: 9 (ref 5–15)
BUN: 5 mg/dL — AB (ref 6–20)
CALCIUM: 8.2 mg/dL — AB (ref 8.9–10.3)
CHLORIDE: 103 mmol/L (ref 101–111)
CO2: 25 mmol/L (ref 22–32)
CREATININE: 0.64 mg/dL (ref 0.61–1.24)
GFR calc Af Amer: >60 mL/min (ref >60)
GFR calc non Af Amer: >60 mL/min (ref >60)
Glucose, Bld: 88 mg/dL (ref 65–99)
Potassium: 3.7 mmol/L (ref 3.5–5.1)
SODIUM: 137 mmol/L (ref 135–145)

## 2015-03-22 LAB — CBC
HCT: 36.4 % — ABNORMAL LOW (ref 39.0–52.0)
Hemoglobin: 12.2 g/dL — ABNORMAL LOW (ref 13.0–17.0)
MCH: 35.4 pg — AB (ref 26.0–34.0)
MCHC: 33.5 g/dL (ref 30.0–36.0)
MCV: 105.5 fL — AB (ref 78.0–100.0)
PLATELETS: 99 10*3/uL — AB (ref 150–400)
RBC: 3.45 MIL/uL — ABNORMAL LOW (ref 4.22–5.81)
RDW: 16.3 % — AB (ref 11.5–15.5)
WBC: 4.3 10*3/uL (ref 4.0–10.5)

## 2015-03-22 LAB — HIV ANTIBODY (ROUTINE TESTING W REFLEX): HIV Screen 4th Generation wRfx: NONREACTIVE

## 2015-03-22 MED ORDER — GABAPENTIN 100 MG PO CAPS: 100.0000 mg | ORAL_CAPSULE | Freq: Two times a day (BID) | ORAL | Status: DC

## 2015-03-22 MED ORDER — AMOXICILLIN-POT CLAVULANATE ER 1000-62.5 MG PO TB12: 2.0000 | ORAL_TABLET | Freq: Two times a day (BID) | ORAL | Status: DC

## 2015-03-22 MED ORDER — HYDROXYZINE PAMOATE 50 MG PO CAPS: 50.0000 mg | ORAL_CAPSULE | Freq: Every evening | ORAL | Status: DC | PRN

## 2015-03-22 MED ORDER — GUAIFENESIN ER 600 MG PO TB12: 600.0000 mg | ORAL_TABLET | Freq: Two times a day (BID) | ORAL | Status: DC

## 2015-03-22 MED ORDER — LORAZEPAM 2 MG PO TABS: 1.0000 mg | ORAL_TABLET | Freq: Three times a day (TID) | ORAL | Status: DC | PRN

## 2015-03-22 MED ORDER — PANTOPRAZOLE SODIUM 40 MG PO TBEC: 40.0000 mg | DELAYED_RELEASE_TABLET | Freq: Every day | ORAL | Status: DC

## 2015-03-22 NOTE — Discharge Summary (Signed)
Physician Discharge Summary  MYLON MABEY NFA:[id number] DOB: 1970-03-03 DOA: 03/20/2015  PCP: Bennie Pierini, FNP  Admit date: 03/20/2015 Discharge date: 03/22/2015  Time spent: 35 minutes  Recommendations for Outpatient Follow-up:  1. Follow up with outpatient alcohol rehab 2. Follow up with primary care physician in 2-3 weeks 3. Repeat chest xray in 3-4 weeks to ensure resolution of pneumonia   Discharge Diagnoses:  Principal Problem:   Alcohol abuse Active Problems:   Essential hypertension   h/o VENTRICULAR TACHYCARDIA   Hemochromatosis   Alcohol intoxication (HCC)   Acute respiratory failure with hypoxia (HCC)   Encephalopathy acute   QT prolongation   Adjustment disorder with mixed anxiety and depressed mood   Pneumococcal pneumonia  Discharge Condition: improved  Diet recommendation: heart healthy  Filed Weights   03/20/15 2100 03/21/15 0500 03/21/15 1300  Weight: 76.2 kg (167 lb 15.9 oz) 76.2 kg (167 lb 15.9 oz) 73.5 kg (162 lb 0.6 oz)    History of present illness:  45 y/o male admitted to the hospital with lethargy. He was found down at home by a friend. He was noted to be hypoxic requiring 6L [place]. Patient has been heavily drinking alcohol and did not have any recollection of events that led to admission. He was admitted for further management  Hospital Course:  Patient was admitted with alcohol intoxication with an alcohol level >500. He was monitored in the hospital and reported recent separation from his wife with on going depression that led him to start drinking more. He was seen by psychiatry and was not interested in pursuing inpatient rehab. He elected to pursue outpatient resources to help with him quitting drinking. During his hospital stay, he was not tachycardic, confused/delirious. He has a mild tremor. He reports using ativan prn at home for anxiety. He was advised to continue ativan for the next few days to prevent withdrawal.   He was  noted to be hypoxic, but is now breathing comfortably on room air. He was found to have a right middle lobe pneumonia on xray and step pneumo antigen was positive. He was treated with augmentin and has remained afebrile and normal wbc count. He is breathing comfortably on room air. He was complaining of pleuritic chest pain, likely related to pneumonia.   Patient appears back to his baseline level of functioning. He is stable for discharge home with outpatient alcohol rehab follow up.  Procedures:    Consultations:  psychiatry  Discharge Exam: Filed Vitals:   03/21/15 2113 03/22/15 0610  BP: 128/77 136/89  Pulse: 87 77  Temp: 99.2 F (37.3 C) 98.6 F (37 C)  Resp: 20 20    General: NAD, alert and oriented Cardiovascular: S1, S2 RRR Respiratory: rhonchi on right side  Discharge Instructions   Discharge Instructions    Diet - low sodium heart healthy    Complete by:  As directed      Increase activity slowly    Complete by:  As directed           Current Discharge Medication List    START taking these medications   Details  amoxicillin-clavulanate (AUGMENTIN XR) 1000-62.5 MG 12 hr tablet Take 2 tablets by mouth 2 (two) times daily. Qty: 20 tablet, Refills: 0    gabapentin (NEURONTIN) 100 MG capsule Take 1 capsule (100 mg total) by mouth 2 (two) times daily. Qty: 60 capsule, Refills: 0    guaiFENesin (MUCINEX) 600 MG 12 hr tablet Take 1 tablet (600 mg total) by  mouth 2 (two) times daily. Qty: 20 tablet, Refills: 0    hydrOXYzine (VISTARIL) 50 MG capsule Take 1 capsule (50 mg total) by mouth at bedtime as needed (insomnia). Qty: 30 capsule, Refills: 0    pantoprazole (PROTONIX) 40 MG tablet Take 1 tablet (40 mg total) by mouth daily. Qty: 30 tablet, Refills: 0      CONTINUE these medications which have CHANGED   Details  LORazepam (ATIVAN) 2 MG tablet Take 0.5 tablets (1 mg total) by mouth 3 (three) times daily as needed for anxiety (withdrawal). Qty: 30  tablet, Refills: 2      CONTINUE these medications which have NOT CHANGED   Details  CIALIS 20 MG tablet TAKE ONE TABLET BY MOUTH AS NEEDED Qty: 3 tablet, Refills: 2    diphenhydrAMINE (BENADRYL) 25 MG tablet Take 25 mg by mouth every 6 (six) hours as needed for allergies.     fenofibrate 54 MG tablet Take 1 tablet by mouth  daily Qty: 90 tablet, Refills: 1   Associated Diagnoses: Hyperlipidemia    folic acid (FOLVITE) 1 MG tablet Take 1 tablet (1 mg total) by mouth daily. Qty: 100 tablet, Refills: 3    magnesium oxide (MAG-OX) 400 MG tablet Take 1 tablet (400 mg total) by mouth daily. Qty: 90 tablet, Refills: 1    metoprolol succinate (TOPROL-XL) 50 MG 24 hr tablet Take 1 tablet by mouth  daily; with, or immediately following a meal Qty: 90 tablet, Refills: 0    potassium chloride (K-DUR,KLOR-CON) 10 MEQ tablet Take 1 tablet by mouth  every day Qty: 90 tablet, Refills: 1   Associated Diagnoses: Hypokalemia    simvastatin (ZOCOR) 40 MG tablet Take 1 tablet (40 mg total) by mouth daily. Qty: 90 tablet, Refills: 1   Associated Diagnoses: Hyperlipidemia    Vitamin D, Ergocalciferol, (DRISDOL) 50000 UNITS CAPS capsule Take 1 capsule by mouth  once a week Qty: 12 capsule, Refills: 0       Allergies  Allergen Reactions  . Hctz [Hydrochlorothiazide] Rash      The results of significant diagnostics from this hospitalization (including imaging, microbiology, ancillary and laboratory) are listed below for reference.    Significant Diagnostic Studies: Ct Head Wo Contrast  03/20/2015  CLINICAL DATA:  45 year old male with altered mental status. Intoxicated. EXAM: CT HEAD WITHOUT CONTRAST TECHNIQUE: Contiguous axial images were obtained from the base of the skull through the vertex without intravenous contrast. COMPARISON:  None. FINDINGS: Atrophy identified. No acute intracranial abnormalities are identified, including mass lesion or mass effect, hydrocephalus, extra-axial fluid  collection, midline shift, hemorrhage, or acute infarction. The visualized bony calvarium is unremarkable. IMPRESSION: No evidence of acute intracranial abnormality. Atrophy. Electronically Signed   By: Harmon Pier M.D.   On: 03/20/2015 18:40   Dg Chest Portable 1 View  03/20/2015  CLINICAL DATA:  Intoxicated. Hypoxia. Unresponsive. Possible aspiration. EXAM: PORTABLE CHEST 1 VIEW COMPARISON:  03/08/2006 FINDINGS: Low lung volumes are noted. Pulmonary infiltrate or atelectasis is seen in the medial right lung base. Heart size is within normal limits. No evidence of pneumothorax or definite pleural effusion. IMPRESSION: New low lung volumes with infiltrate or atelectasis in medial right lung base. Electronically Signed   By: Myles Rosenthal M.D.   On: 03/20/2015 19:13    Microbiology: Recent Results (from the past 240 hour(s))  MRSA PCR Screening     Status: None   Collection Time: 03/20/15  8:41 PM  Result Value Ref Range Status   MRSA by  PCR NEGATIVE NEGATIVE Final    Comment:        The GeneXpert MRSA Assay (FDA approved for NASAL specimens only), is one component of a comprehensive MRSA colonization surveillance program. It is not intended to diagnose MRSA infection nor to guide or monitor treatment for MRSA infections.   Culture, blood (routine x 2) Call MD if unable to obtain prior to antibiotics being given     Status: None (Preliminary result)   Collection Time: 03/20/15  9:24 PM  Result Value Ref Range Status   Specimen Description BLOOD RIGHT ARM  Final   Special Requests BOTTLES DRAWN AEROBIC AND ANAEROBIC 4CC  Final   Culture NO GROWTH < 24 HOURS  Final   Report Status PENDING  Incomplete  Culture, blood (routine x 2) Call MD if unable to obtain prior to antibiotics being given     Status: None (Preliminary result)   Collection Time: 03/20/15  9:34 PM  Result Value Ref Range Status   Specimen Description BLOOD RIGHT HAND  Final   Special Requests BOTTLES DRAWN AEROBIC AND  ANAEROBIC 4CC EACH  Final   Culture NO GROWTH < 24 HOURS  Final   Report Status PENDING  Incomplete     Labs: Basic Metabolic Panel:  Recent Labs Lab 03/20/15 1735 03/20/15 2001 03/21/15 0433 03/22/15 0510  NA 142  --  141 137  K 3.2*  --  3.1* 3.7  CL 97*  --  101 103  CO2 29  --  27 25  GLUCOSE 166*  --  130* 88  BUN 7  --  8 5*  CREATININE 0.95  --  0.65 0.64  CALCIUM 8.6*  --  7.5* 8.2*  MG  --  1.6*  --   --    Liver Function Tests:  Recent Labs Lab 03/20/15 1735 03/21/15 0433  AST 77* 57*  ALT 59 45  ALKPHOS 68 54  BILITOT 0.6 0.7  PROT 7.6 6.2*  ALBUMIN 4.2 3.4*   No results for input(s): LIPASE, AMYLASE in the last 168 hours.  Recent Labs Lab 03/20/15 1850  AMMONIA 38*   CBC:  Recent Labs Lab 03/20/15 1735 03/21/15 0433 03/22/15 0510  WBC 6.1 6.9 4.3  NEUTROABS 3.4 5.8  --   HGB 15.8 13.0 12.2*  HCT 46.0 38.5* 36.4*  MCV 104.1* 104.1* 105.5*  PLT 175 131* 99*   Cardiac Enzymes:  Recent Labs Lab 03/20/15 2001  TROPONINI <0.03   BNP: BNP (last 3 results) No results for input(s): BNP in the last 8760 hours.  ProBNP (last 3 results) No results for input(s): PROBNP in the last 8760 hours.  CBG:  Recent Labs Lab 03/20/15 1728  GLUCAP 153*       Signed:  MEMON,JEHANZEB MD.  [facility] 03/22/2015, 9:59 AM

## 2015-03-22 NOTE — Progress Notes (Signed)
Patient with orders to be discharge home. Discharge instructions given, patient verbalized understanding. Prescriptions given. Patient stable. Patient left in private vehicle with family.  

## 2015-03-25 LAB — CULTURE, BLOOD (ROUTINE X 2)
CULTURE: NO GROWTH
Culture: NO GROWTH

## 2015-04-04 ENCOUNTER — Ambulatory Visit: Payer: 59 | Admitting: Nurse Practitioner

## 2015-04-05 ENCOUNTER — Encounter: Payer: Self-pay | Admitting: Nurse Practitioner

## 2015-04-18 ENCOUNTER — Ambulatory Visit: Payer: 59 | Admitting: Nurse Practitioner

## 2015-05-02 ENCOUNTER — Other Ambulatory Visit: Payer: Self-pay | Admitting: Nurse Practitioner

## 2015-05-03 ENCOUNTER — Other Ambulatory Visit: Payer: Self-pay | Admitting: Nurse Practitioner

## 2015-05-03 ENCOUNTER — Other Ambulatory Visit: Payer: Self-pay | Admitting: Family Medicine

## 2015-05-03 NOTE — Telephone Encounter (Signed)
lmovm that medication should have a refill on it & it is time to be seen & labwork

## 2015-05-03 NOTE — Telephone Encounter (Addendum)
According to chart has refill on last rx Patient NTBS for follow up and lab work

## 2015-05-03 NOTE — Telephone Encounter (Signed)
Appt made for next week. Ativan was refilled to [facility] pt will call them.

## 2015-05-03 NOTE — Telephone Encounter (Signed)
Last filled 04/02/15, last seen 01/28/15. [facility]

## 2015-05-04 NOTE — Telephone Encounter (Signed)
Pt called back today. Refill of Ativan done 1/31 was sent to [facility] who does not have this on file & is not able to mail this. He has 7 pills left. Has appt scheduled for 3/23. [facility] [place]

## 2015-05-04 NOTE — Telephone Encounter (Signed)
Last seen 01/28/15  MMM  Do not see Vit D level in EPIC

## 2015-05-04 NOTE — Telephone Encounter (Signed)
Last seen 01/28/15  MMM If approved route to nurse to call into [facility]

## 2015-05-05 NOTE — Telephone Encounter (Signed)
Refill denied- NTBS 

## 2015-05-10 NOTE — Telephone Encounter (Signed)
Appt is scheduled for 3/23

## 2015-05-12 ENCOUNTER — Other Ambulatory Visit: Payer: Self-pay | Admitting: Nurse Practitioner

## 2015-05-12 ENCOUNTER — Ambulatory Visit (INDEPENDENT_AMBULATORY_CARE_PROVIDER_SITE_OTHER): Payer: 59 | Admitting: Nurse Practitioner

## 2015-05-12 ENCOUNTER — Encounter: Payer: Self-pay | Admitting: Nurse Practitioner

## 2015-05-12 VITALS — BP 128/82 | HR 85 | Temp 97.5°F | Ht 69.0 in | Wt 166.0 lb

## 2015-05-12 DIAGNOSIS — F1010 Alcohol abuse, uncomplicated: Secondary | ICD-10-CM

## 2015-05-12 DIAGNOSIS — I472 Ventricular tachycardia, unspecified: Secondary | ICD-10-CM

## 2015-05-12 DIAGNOSIS — M1A9XX0 Chronic gout, unspecified, without tophus (tophi): Secondary | ICD-10-CM | POA: Diagnosis not present

## 2015-05-12 DIAGNOSIS — G4700 Insomnia, unspecified: Secondary | ICD-10-CM

## 2015-05-12 DIAGNOSIS — J9601 Acute respiratory failure with hypoxia: Secondary | ICD-10-CM

## 2015-05-12 DIAGNOSIS — I10 Essential (primary) hypertension: Secondary | ICD-10-CM

## 2015-05-12 DIAGNOSIS — Z6825 Body mass index (BMI) 25.0-25.9, adult: Secondary | ICD-10-CM | POA: Diagnosis not present

## 2015-05-12 DIAGNOSIS — F4323 Adjustment disorder with mixed anxiety and depressed mood: Secondary | ICD-10-CM | POA: Diagnosis not present

## 2015-05-12 DIAGNOSIS — E039 Hypothyroidism, unspecified: Secondary | ICD-10-CM | POA: Diagnosis not present

## 2015-05-12 DIAGNOSIS — E876 Hypokalemia: Secondary | ICD-10-CM | POA: Diagnosis not present

## 2015-05-12 DIAGNOSIS — I4729 Other ventricular tachycardia: Secondary | ICD-10-CM

## 2015-05-12 DIAGNOSIS — E785 Hyperlipidemia, unspecified: Secondary | ICD-10-CM | POA: Diagnosis not present

## 2015-05-12 MED ORDER — SIMVASTATIN 40 MG PO TABS: 40.0000 mg | ORAL_TABLET | Freq: Every day | ORAL | Status: DC

## 2015-05-12 MED ORDER — POTASSIUM CHLORIDE CRYS ER 10 MEQ PO TBCR: EXTENDED_RELEASE_TABLET | ORAL | Status: DC

## 2015-05-12 MED ORDER — LORAZEPAM 2 MG PO TABS: 1.0000 mg | ORAL_TABLET | Freq: Every day | ORAL | Status: DC

## 2015-05-12 MED ORDER — METOPROLOL SUCCINATE ER 50 MG PO TB24: ORAL_TABLET | ORAL | Status: DC

## 2015-05-12 MED ORDER — SILDENAFIL CITRATE 20 MG PO TABS
20.0000 mg | ORAL_TABLET | Freq: Three times a day (TID) | ORAL | Status: DC
Start: 2015-05-12 — End: 2015-05-18

## 2015-05-12 MED ORDER — FENOFIBRATE 54 MG PO TABS: ORAL_TABLET | ORAL | Status: DC

## 2015-05-12 NOTE — Addendum Note (Signed)
Addended by: Bennie PieriniMARTIN, MARY-MARGARET on: 05/12/2015 10:11 AM   Modules accepted: Orders

## 2015-05-12 NOTE — Progress Notes (Signed)
Subjective:    Patient ID: Christian King, male    DOB: 12-16-1970, 45 y.o.   MRN: [id number]  Patient here today for follow up of chronic medical problems.  Current Outpatient Prescriptions on File Prior to Visit  Medication Sig Dispense Refill  . CIALIS 20 MG tablet TAKE ONE TABLET BY MOUTH AS NEEDED 3 tablet 2  . diphenhydrAMINE (BENADRYL) 25 MG tablet Take 25 mg by mouth every 6 (six) hours as needed for allergies.     . fenofibrate 54 MG tablet Take 1 tablet by mouth  daily 90 tablet 1  . hydrOXYzine (VISTARIL) 50 MG capsule Take 1 capsule (50 mg total) by mouth at bedtime as needed (insomnia). 30 capsule 0  . LORazepam (ATIVAN) 2 MG tablet Take 0.5 tablets (1 mg total) by mouth 3 (three) times daily as needed for anxiety (withdrawal). 30 tablet 2  . magnesium oxide (MAG-OX) 400 MG tablet Take 1 tablet (400 mg total) by mouth daily. 90 tablet 1  . metoprolol succinate (TOPROL-XL) 50 MG 24 hr tablet Take 1 tablet by mouth  daily; with, or immediately following a meal 90 tablet 0  . potassium chloride (K-DUR,KLOR-CON) 10 MEQ tablet Take 1 tablet by mouth  every day 90 tablet 1  . simvastatin (ZOCOR) 40 MG tablet Take 1 tablet (40 mg total) by mouth daily. 90 tablet 1  . Vitamin D, Ergocalciferol, (DRISDOL) 50000 units CAPS capsule Take 1 capsule by mouth  once every week 12 capsule 0   No current facility-administered medications on file prior to visit.      Hypertension This is a chronic problem. The current episode started more than 1 year ago. The problem has been gradually improving since onset. The problem is uncontrolled. Pertinent negatives include no headaches, palpitations, peripheral edema or shortness of breath. Risk factors for coronary artery disease include dyslipidemia and male gender. The current treatment provides mild improvement. Compliance problems include diet and exercise.   Hyperlipidemia This is a chronic problem. The current episode started more than 1 year  ago. The problem is uncontrolled. Recent lipid tests were reviewed and are variable. Exacerbating diseases include obesity. He has no history of diabetes or hypothyroidism. Pertinent negatives include no myalgias or shortness of breath. Current antihyperlipidemic treatment includes statins and fibric acid derivatives. The current treatment provides moderate improvement of lipids. Compliance problems include adherence to diet and adherence to exercise.  Risk factors for coronary artery disease include dyslipidemia, hypertension and male sex.  Insomnia Pt takes 1/2 ativan about 3-4 nights a weeks- Sometimes taking in the mornings now. Works well with no complaints  Requesting Rx for vistaril for sleep.  Alcohol abuse Patient says that he drinks daily. Has no desire to stop. 3-4 beers in the evenings.  Hypokalemia Potassium supplement daily- having some cramps.   Review of Systems  Constitutional: Negative.   HENT: Negative.   Eyes: Negative.        Wears glasses  Respiratory: Negative.  Negative for shortness of breath.   Cardiovascular: Negative.  Negative for palpitations.  Gastrointestinal: Negative.   Endocrine: Negative.   Genitourinary: Negative.   Musculoskeletal: Negative.  Negative for myalgias.  Skin: Negative.   Allergic/Immunologic: Negative.   Neurological: Negative.  Negative for headaches.  Hematological: Negative.   Psychiatric/Behavioral: Negative.   All other systems reviewed and are negative.      Objective:   Physical Exam  Constitutional: He is oriented to person, place, and time. He appears well-developed and well-nourished.  HENT:  Head: Normocephalic.  Right Ear: External ear normal.  Left Ear: External ear normal.  Nose: Nose normal.  Mouth/Throat: Oropharynx is clear and moist.  Eyes: Conjunctivae and EOM are normal. Pupils are equal, round, and reactive to light.  Neck: Normal range of motion. Neck supple. No JVD present. No thyromegaly present.   Cardiovascular: Normal rate, regular rhythm, normal heart sounds and intact distal pulses.  Exam reveals no gallop and no friction rub.   No murmur heard. Pulmonary/Chest: Effort normal and breath sounds normal. No respiratory distress. He has no wheezes. He has no rales. He exhibits no tenderness.  Abdominal: Soft. He exhibits no mass. There is no tenderness.  Hyperactive bowel sounds  Musculoskeletal: Normal range of motion. He exhibits no edema.  Lymphadenopathy:    He has no cervical adenopathy.  Neurological: He is alert and oriented to person, place, and time. No cranial nerve deficit.  Skin: Skin is warm and dry.  Psychiatric: He has a normal mood and affect. His behavior is normal. Judgment and thought content normal.    BP 128/82 mmHg  Pulse 85  Temp(Src) 97.5 F (36.4 C) (Oral)  Ht '5\' 9"'  (1.753 m)  Wt 166 lb (75.297 kg)  BMI 24.50 kg/m2     Assessment & Plan:   1. Essential hypertension Do not add saltto diet - metoprolol succinate (TOPROL-XL) 50 MG 24 hr tablet; Take 1 tablet by mouth  daily; with, or immediately following a meal  Dispense: 90 tablet; Refill: 1 - CMP14+EGFR  2. h/o VENTRICULAR TACHYCARDIA  3. Acute respiratory failure with hypoxia (HCC)  4. Hypothyroidism, unspecified hypothyroidism type  5. Hyperlipidemia Low fat diet - simvastatin (ZOCOR) 40 MG tablet; Take 1 tablet (40 mg total) by mouth daily.  Dispense: 90 tablet; Refill: 1 - fenofibrate 54 MG tablet; Take 1 tablet by mouth  daily  Dispense: 90 tablet; Refill: 1 - Lipid panel  6. Chronic gout without tophus, unspecified cause, unspecified site Stop alcohol  7. Alcohol abuse encouraged to stop drinking Will not feel ativan until negative drug screen - ToxASSURE Select 13 (MW), Urine  8. Hemochromatosis  9. Insomnia Bedtime ritual  10. BMI 25.0-25.9,adult Discussed diet and exercise for person with BMI >25 Will recheck weight in 3-6 months  11. Adjustment disorder with mixed  anxiety and depressed mood  12. Hypokalemia - potassium chloride (K-DUR,KLOR-CON) 10 MEQ tablet; Take 1 tablet by mouth  every day  Dispense: 90 tablet; Refill: 1    Labs pending Health maintenance reviewed Diet and exercise encouraged Continue all meds Follow up  In 3 months   [place], FNP

## 2015-05-12 NOTE — Patient Instructions (Signed)

## 2015-05-13 ENCOUNTER — Telehealth: Payer: Self-pay | Admitting: Nurse Practitioner

## 2015-05-13 LAB — CMP14+EGFR
A/G RATIO: 1.6 (ref 1.2–2.2)
ALK PHOS: 84 IU/L (ref 39–117)
ALT: 52 IU/L — ABNORMAL HIGH (ref 0–44)
AST: 67 IU/L — AB (ref 0–40)
Albumin: 4.2 g/dL (ref 3.5–5.5)
BILIRUBIN TOTAL: 1.1 mg/dL (ref 0.0–1.2)
BUN/Creatinine Ratio: 7 — ABNORMAL LOW (ref 9–20)
BUN: 5 mg/dL — ABNORMAL LOW (ref 6–24)
CHLORIDE: 93 mmol/L — AB (ref 96–106)
CO2: 19 mmol/L (ref 18–29)
CREATININE: 0.73 mg/dL — AB (ref 0.76–1.27)
Calcium: 9.1 mg/dL (ref 8.7–10.2)
GFR calc Af Amer: 128 mL/min/{1.73_m2} (ref >59)
GFR calc non Af Amer: 110 mL/min/{1.73_m2} (ref >59)
GLOBULIN, TOTAL: 2.6 g/dL (ref 1.5–4.5)
Glucose: 77 mg/dL (ref 65–99)
POTASSIUM: 3.8 mmol/L (ref 3.5–5.2)
SODIUM: 135 mmol/L (ref 134–144)
Total Protein: 6.8 g/dL (ref 6.0–8.5)

## 2015-05-13 LAB — ANEMIA PROFILE B
BASOS: 1 %
Basophils Absolute: 0 10*3/uL (ref 0.0–0.2)
EOS (ABSOLUTE): 0.1 10*3/uL (ref 0.0–0.4)
EOS: 1 %
Ferritin: 287 ng/mL (ref 30–400)
Folate: <2 ng/mL — ABNORMAL LOW (ref >3.0)
Hematocrit: 42.2 % (ref 37.5–51.0)
Hemoglobin: 14.4 g/dL (ref 12.6–17.7)
IMMATURE GRANS (ABS): 0 10*3/uL (ref 0.0–0.1)
IMMATURE GRANULOCYTES: 0 %
Iron Saturation: 55 % (ref 15–55)
Iron: 196 ug/dL — ABNORMAL HIGH (ref 38–169)
LYMPHS ABS: 1.1 10*3/uL (ref 0.7–3.1)
LYMPHS: 25 %
MCH: 36.3 pg — AB (ref 26.6–33.0)
MCHC: 34.1 g/dL (ref 31.5–35.7)
MCV: 106 fL — ABNORMAL HIGH (ref 79–97)
MONOS ABS: 0.8 10*3/uL (ref 0.1–0.9)
Monocytes: 19 %
NEUTROS PCT: 54 %
Neutrophils Absolute: 2.4 10*3/uL (ref 1.4–7.0)
Platelets: 106 10*3/uL — ABNORMAL LOW (ref 150–379)
RBC: 3.97 x10E6/uL — AB (ref 4.14–5.80)
RDW: 14.9 % (ref 12.3–15.4)
Retic Ct Pct: 1.3 % (ref 0.6–2.6)
TIBC: 358 ug/dL (ref 250–450)
UIBC: 162 ug/dL (ref 111–343)
Vitamin B-12: 304 pg/mL (ref 211–946)
WBC: 4.4 10*3/uL (ref 3.4–10.8)

## 2015-05-13 LAB — LIPID PANEL
CHOLESTEROL TOTAL: 168 mg/dL (ref 100–199)
Chol/HDL Ratio: 1.7 ratio units (ref 0.0–5.0)
HDL: 99 mg/dL (ref >39)
LDL Calculated: 57 mg/dL (ref 0–99)
TRIGLYCERIDES: 60 mg/dL (ref 0–149)
VLDL Cholesterol Cal: 12 mg/dL (ref 5–40)

## 2015-05-13 NOTE — Telephone Encounter (Signed)
Stp and he had questions about Lorazepam rx, advised pt we are waiting on tox screen to come back per MMM office notes. Pt voiced understanding.

## 2015-05-14 ENCOUNTER — Telehealth: Payer: Self-pay | Admitting: *Deleted

## 2015-05-14 NOTE — Telephone Encounter (Signed)
Patient states the the sildenafil was called in for 1 three times a day and #10 and [facility] states that ir can not be filled with these instructions. Please advise

## 2015-05-17 NOTE — Telephone Encounter (Signed)
Was not suppose  To go to mail order

## 2015-05-18 ENCOUNTER — Telehealth: Payer: Self-pay | Admitting: Nurse Practitioner

## 2015-05-18 LAB — TOXASSURE SELECT 13 (MW), URINE: PDF: 0

## 2015-05-18 MED ORDER — SILDENAFIL CITRATE 20 MG PO TABS: ORAL_TABLET | ORAL | Status: DC

## 2015-05-18 NOTE — Telephone Encounter (Signed)
rx changed patient aware

## 2015-05-24 ENCOUNTER — Telehealth: Payer: Self-pay

## 2015-05-24 NOTE — Telephone Encounter (Signed)
Prior auth for Sildenafil 20mg , as directed, [facility]

## 2015-06-02 ENCOUNTER — Telehealth: Payer: Self-pay | Admitting: Nurse Practitioner

## 2015-06-02 MED ORDER — SILDENAFIL CITRATE 20 MG PO TABS: ORAL_TABLET | ORAL | Status: DC

## 2015-06-02 NOTE — Telephone Encounter (Signed)
Please review and advise.

## 2015-06-02 NOTE — Telephone Encounter (Signed)
rx should not have ran out- i am confused

## 2015-06-02 NOTE — Telephone Encounter (Signed)
RX for Sildenafil resubmitted to [facility] per pt request

## 2015-06-10 ENCOUNTER — Telehealth: Payer: Self-pay

## 2015-06-10 NOTE — Telephone Encounter (Signed)
[facility]

## 2015-06-16 ENCOUNTER — Telehealth: Payer: Self-pay

## 2015-06-16 NOTE — Telephone Encounter (Signed)
[facility]

## 2015-06-16 NOTE — Telephone Encounter (Signed)
Sildenafil denied by [employer].  

## 2015-07-14 ENCOUNTER — Other Ambulatory Visit: Payer: Self-pay | Admitting: Internal Medicine

## 2015-07-15 ENCOUNTER — Other Ambulatory Visit: Payer: Self-pay | Admitting: Internal Medicine

## 2015-07-15 ENCOUNTER — Other Ambulatory Visit: Payer: Self-pay | Admitting: Nurse Practitioner

## 2015-07-15 NOTE — Telephone Encounter (Signed)
Last filled 06/16/15, last seen 05/12/15. [facility]

## 2015-07-15 NOTE — Telephone Encounter (Signed)
Please call in ativan with 1 refills 

## 2015-07-19 ENCOUNTER — Other Ambulatory Visit: Payer: Self-pay

## 2015-07-19 DIAGNOSIS — I10 Essential (primary) hypertension: Secondary | ICD-10-CM

## 2015-07-19 MED ORDER — METOPROLOL SUCCINATE ER 50 MG PO TB24: ORAL_TABLET | ORAL | Status: DC

## 2015-08-01 ENCOUNTER — Telehealth: Payer: Self-pay | Admitting: Nurse Practitioner

## 2015-08-01 ENCOUNTER — Telehealth: Payer: Self-pay | Admitting: Family Medicine

## 2015-08-01 NOTE — Telephone Encounter (Signed)
FYI Father is very concerned Per pt's father patient is over taking medication He had to take pt to ED in Jan due to alcohol and medication

## 2015-08-08 ENCOUNTER — Telehealth: Payer: Self-pay | Admitting: Nurse Practitioner

## 2015-08-08 NOTE — Telephone Encounter (Signed)
Appt given per patients request 

## 2015-08-19 ENCOUNTER — Ambulatory Visit: Payer: 59 | Admitting: Nurse Practitioner

## 2015-09-28 ENCOUNTER — Other Ambulatory Visit: Payer: Self-pay | Admitting: Nurse Practitioner

## 2015-09-29 NOTE — Telephone Encounter (Signed)
RX for Lorazepam called into [facility] per pt request Okayed per [employer] Hawks

## 2015-10-05 ENCOUNTER — Ambulatory Visit (INDEPENDENT_AMBULATORY_CARE_PROVIDER_SITE_OTHER): Payer: 59 | Admitting: Family Medicine

## 2015-10-05 ENCOUNTER — Encounter: Payer: Self-pay | Admitting: Family Medicine

## 2015-10-05 VITALS — Ht 69.0 in

## 2015-10-05 DIAGNOSIS — J029 Acute pharyngitis, unspecified: Secondary | ICD-10-CM

## 2015-10-05 LAB — RAPID STREP SCREEN (MED CTR MEBANE ONLY): Strep Gp A Ag, IA W/Reflex: POSITIVE — AB

## 2015-10-05 MED ORDER — SILDENAFIL CITRATE 20 MG PO TABS: ORAL_TABLET | ORAL | 3 refills | Status: DC

## 2015-10-05 MED ORDER — AMOXICILLIN-POT CLAVULANATE 875-125 MG PO TABS: 1.0000 | ORAL_TABLET | Freq: Two times a day (BID) | ORAL | 0 refills | Status: DC

## 2015-10-05 NOTE — Progress Notes (Signed)
Subjective:  Patient ID: Christian King, male    DOB: 07/16/1970  Age: 45 y.o. MRN: [id number]  CC: Sore Throat (started on Sunday, fever, headache)   HPI Christian King presents for Patient presents with upper respiratory congestion. Onset 2 days ago with sore throat so severe it felt like he was swallowing razor blades when he tried to eat a chicken sandwich a couple of days ago. There is no fever no chills no sweats. The patient denies being short of breath. Headache has been a dull all over pain. Patient also having some partial erectile dysfunction. He couldn't afford Cialis and would like to have the Revatio renewed.  History Christian King has a past medical history of Alcohol abuse; B12 deficiency; Bronchitis; Gout; HLD (hyperlipidemia); HTN (hypertension); Hypothyroidism; and Ventricular tachycardia (HCC).   He has a past surgical history that includes vascularized fibular graft and Total hip arthroplasty (Right).   His family history includes Diabetes in his maternal grandmother; Hypertension in his father and paternal grandmother; Stroke in his paternal grandmother.He reports that he quit smoking about 5 years ago. He smoked 0.20 packs per day. He has never used smokeless tobacco. He reports that he drinks alcohol. He reports that he does not use drugs.    ROS Review of Systems  Constitutional: Negative for activity change, appetite change, chills and fever.  HENT: Positive for congestion and sore throat. Negative for ear pain, postnasal drip, rhinorrhea, sinus pressure and trouble swallowing.   Respiratory: Negative for shortness of breath.   Skin: Negative for rash.    Objective:  Ht 5\' 9"  (1.753 m)   BP Readings from Last 3 Encounters:  05/12/15 128/82  03/22/15 136/89  01/28/15 120/85    Wt Readings from Last 3 Encounters:  05/12/15 166 lb (75.3 kg)  03/21/15 162 lb 0.6 oz (73.5 kg)  01/28/15 167 lb (75.8 kg)     Physical Exam  Constitutional: He appears  well-developed and well-nourished.  HENT:  Head: Normocephalic and atraumatic.  Right Ear: Tympanic membrane and external ear normal. No decreased hearing is noted.  Left Ear: Tympanic membrane and external ear normal. No decreased hearing is noted.  Nose: Mucosal edema present. Right sinus exhibits no frontal sinus tenderness. Left sinus exhibits no frontal sinus tenderness.  Mouth/Throat: Oropharyngeal exudate (posterior pharynx is angry, red with soft palate petechiae and few exudates.) present. No posterior oropharyngeal erythema.  Neck: No Brudzinski's sign noted.  Pulmonary/Chest: Breath sounds normal. No respiratory distress.  Lymphadenopathy:       Head (right side): No preauricular adenopathy present.       Head (left side): No preauricular adenopathy present.       Right cervical: No superficial cervical adenopathy present.      Left cervical: No superficial cervical adenopathy present.     Lab Results  Component Value Date   WBC 4.4 05/12/2015   HGB 12.2 (L) 03/22/2015   HCT 42.2 05/12/2015   PLT 106 (L) 05/12/2015   GLUCOSE 77 05/12/2015   CHOL 168 05/12/2015   TRIG 60 05/12/2015   HDL 99 05/12/2015   LDLCALC 57 05/12/2015   ALT 52 (H) 05/12/2015   AST 67 (H) 05/12/2015   NA 135 05/12/2015   K 3.8 05/12/2015   CL 93 (L) 05/12/2015   CREATININE 0.73 (L) 05/12/2015   BUN 5 (L) 05/12/2015   CO2 19 05/12/2015   TSH 1.540 09/29/2013    Ct Head Wo Contrast  Result Date: 03/20/2015 CLINICAL DATA:  45 year old male with altered mental status. Intoxicated. EXAM: CT HEAD WITHOUT CONTRAST TECHNIQUE: Contiguous axial images were obtained from the base of the skull through the vertex without intravenous contrast. COMPARISON:  None. FINDINGS: Atrophy identified. No acute intracranial abnormalities are identified, including mass lesion or mass effect, hydrocephalus, extra-axial fluid collection, midline shift, hemorrhage, or acute infarction. The visualized bony calvarium is  unremarkable. IMPRESSION: No evidence of acute intracranial abnormality. Atrophy. Electronically Signed   By: Harmon PierJeffrey  Hu M.D.   On: 03/20/2015 18:40   Dg Chest Portable 1 View  Result Date: 03/20/2015 CLINICAL DATA:  Intoxicated. Hypoxia. Unresponsive. Possible aspiration. EXAM: PORTABLE CHEST 1 VIEW COMPARISON:  03/08/2006 FINDINGS: Low lung volumes are noted. Pulmonary infiltrate or atelectasis is seen in the medial right lung base. Heart size is within normal limits. No evidence of pneumothorax or definite pleural effusion. IMPRESSION: New low lung volumes with infiltrate or atelectasis in medial right lung base. Electronically Signed   By: Myles RosenthalJohn  Stahl M.D.   On: 03/20/2015 19:13    Assessment & Plan:   Christian King was seen today for sore throat.  Diagnoses and all orders for this visit:  Sore throat -     Rapid strep screen (not at [facility])  Other orders -     amoxicillin-clavulanate (AUGMENTIN) 875-125 MG tablet; Take 1 tablet by mouth 2 (two) times daily. Take all of this medication -     sildenafil (REVATIO) 20 MG tablet; 2-5 prn sex  I have changed Mr. Christian King's sildenafil. I am also having him start on amoxicillin-clavulanate. Additionally, I am having him maintain his diphenhydrAMINE, magnesium oxide, hydrOXYzine, Vitamin D (Ergocalciferol), simvastatin, fenofibrate, potassium chloride, metoprolol succinate, metoprolol succinate, and LORazepam.  Meds ordered this encounter  Medications  . amoxicillin-clavulanate (AUGMENTIN) 875-125 MG tablet    Sig: Take 1 tablet by mouth 2 (two) times daily. Take all of this medication    Dispense:  20 tablet    Refill:  0  . sildenafil (REVATIO) 20 MG tablet    Sig: 2-5 prn sex    Dispense:  100 tablet    Refill:  3     Follow-up: No Follow-up on file.  Mechele ClaudeWarren Brycen Bean, M.D.

## 2015-10-17 ENCOUNTER — Telehealth: Payer: Self-pay

## 2015-10-17 NOTE — Telephone Encounter (Signed)
Can get from [facility] for 2 dollars a pill.

## 2015-10-31 ENCOUNTER — Other Ambulatory Visit: Payer: Self-pay | Admitting: Nurse Practitioner

## 2015-10-31 DIAGNOSIS — I10 Essential (primary) hypertension: Secondary | ICD-10-CM

## 2015-11-21 ENCOUNTER — Telehealth: Payer: Self-pay | Admitting: Nurse Practitioner

## 2015-11-21 ENCOUNTER — Other Ambulatory Visit: Payer: Self-pay | Admitting: Nurse Practitioner

## 2015-11-21 MED ORDER — INDOMETHACIN ER 75 MG PO CPCR: 75.0000 mg | ORAL_CAPSULE | Freq: Two times a day (BID) | ORAL | 1 refills | Status: DC

## 2015-11-25 ENCOUNTER — Ambulatory Visit: Payer: 59 | Admitting: Nurse Practitioner

## 2015-11-28 ENCOUNTER — Encounter: Payer: Self-pay | Admitting: Nurse Practitioner

## 2015-12-03 ENCOUNTER — Other Ambulatory Visit: Payer: Self-pay | Admitting: Family

## 2015-12-05 NOTE — Telephone Encounter (Signed)
Has appt 01/06/16. Last filled 11/03/15. Call in

## 2015-12-05 NOTE — Telephone Encounter (Signed)
Please call in ativan with 0 refills 

## 2015-12-05 NOTE — Telephone Encounter (Signed)
Refill called to [facility] 

## 2015-12-28 ENCOUNTER — Ambulatory Visit: Payer: Self-pay | Admitting: Internal Medicine

## 2015-12-30 ENCOUNTER — Ambulatory Visit (INDEPENDENT_AMBULATORY_CARE_PROVIDER_SITE_OTHER): Payer: 59 | Admitting: *Deleted

## 2015-12-30 ENCOUNTER — Ambulatory Visit: Payer: Self-pay | Admitting: Internal Medicine

## 2015-12-30 DIAGNOSIS — Z23 Encounter for immunization: Secondary | ICD-10-CM | POA: Diagnosis not present

## 2016-01-06 ENCOUNTER — Other Ambulatory Visit: Payer: 59

## 2016-01-06 ENCOUNTER — Ambulatory Visit: Payer: 59 | Admitting: Nurse Practitioner

## 2016-01-06 DIAGNOSIS — E785 Hyperlipidemia, unspecified: Secondary | ICD-10-CM

## 2016-01-06 DIAGNOSIS — I10 Essential (primary) hypertension: Secondary | ICD-10-CM

## 2016-01-07 LAB — LIPID PANEL
CHOL/HDL RATIO: 3.2 ratio (ref 0.0–5.0)
CHOLESTEROL TOTAL: 151 mg/dL (ref 100–199)
HDL: 47 mg/dL (ref >39)
LDL CALC: 77 mg/dL (ref 0–99)
Triglycerides: 137 mg/dL (ref 0–149)
VLDL Cholesterol Cal: 27 mg/dL (ref 5–40)

## 2016-01-07 LAB — CMP14+EGFR
A/G RATIO: 1.4 (ref 1.2–2.2)
ALBUMIN: 4.3 g/dL (ref 3.5–5.5)
ALT: 11 IU/L (ref 0–44)
AST: 17 IU/L (ref 0–40)
Alkaline Phosphatase: 81 IU/L (ref 39–117)
BUN/Creatinine Ratio: 9 (ref 9–20)
BUN: 7 mg/dL (ref 6–24)
Bilirubin Total: 0.3 mg/dL (ref 0.0–1.2)
CALCIUM: 9.4 mg/dL (ref 8.7–10.2)
CO2: 23 mmol/L (ref 18–29)
Chloride: 104 mmol/L (ref 96–106)
Creatinine, Ser: 0.78 mg/dL (ref 0.76–1.27)
GFR calc non Af Amer: 107 mL/min/{1.73_m2} (ref >59)
GFR, EST AFRICAN AMERICAN: 124 mL/min/{1.73_m2} (ref >59)
GLUCOSE: 101 mg/dL — AB (ref 65–99)
Globulin, Total: 3.1 g/dL (ref 1.5–4.5)
Potassium: 3.9 mmol/L (ref 3.5–5.2)
Sodium: 146 mmol/L — ABNORMAL HIGH (ref 134–144)
TOTAL PROTEIN: 7.4 g/dL (ref 6.0–8.5)

## 2016-01-07 LAB — MAGNESIUM: Magnesium: 1.6 mg/dL (ref 1.6–2.3)

## 2016-01-10 ENCOUNTER — Encounter: Payer: Self-pay | Admitting: Nurse Practitioner

## 2016-01-13 ENCOUNTER — Encounter: Payer: Self-pay | Admitting: Nurse Practitioner

## 2016-01-13 ENCOUNTER — Ambulatory Visit (INDEPENDENT_AMBULATORY_CARE_PROVIDER_SITE_OTHER): Payer: 59 | Admitting: Nurse Practitioner

## 2016-01-13 VITALS — BP 132/84 | HR 93 | Temp 97.4°F | Ht 69.0 in | Wt 174.0 lb

## 2016-01-13 DIAGNOSIS — I10 Essential (primary) hypertension: Secondary | ICD-10-CM | POA: Diagnosis not present

## 2016-01-13 DIAGNOSIS — E039 Hypothyroidism, unspecified: Secondary | ICD-10-CM

## 2016-01-13 DIAGNOSIS — E785 Hyperlipidemia, unspecified: Secondary | ICD-10-CM

## 2016-01-13 DIAGNOSIS — E876 Hypokalemia: Secondary | ICD-10-CM

## 2016-01-13 DIAGNOSIS — G4700 Insomnia, unspecified: Secondary | ICD-10-CM

## 2016-01-13 DIAGNOSIS — Z6825 Body mass index (BMI) 25.0-25.9, adult: Secondary | ICD-10-CM

## 2016-01-13 MED ORDER — METOPROLOL SUCCINATE ER 50 MG PO TB24: ORAL_TABLET | ORAL | 1 refills | Status: DC

## 2016-01-13 MED ORDER — LORAZEPAM 2 MG PO TABS: ORAL_TABLET | ORAL | 1 refills | Status: DC

## 2016-01-13 MED ORDER — POTASSIUM CHLORIDE CRYS ER 10 MEQ PO TBCR
EXTENDED_RELEASE_TABLET | ORAL | 1 refills | Status: DC
Start: 2016-01-13 — End: 2016-08-21

## 2016-01-13 MED ORDER — SIMVASTATIN 40 MG PO TABS: 40.0000 mg | ORAL_TABLET | Freq: Every day | ORAL | 1 refills | Status: DC

## 2016-01-13 MED ORDER — FENOFIBRATE 54 MG PO TABS: ORAL_TABLET | ORAL | 1 refills | Status: DC

## 2016-01-13 NOTE — Progress Notes (Signed)
Subjective:    Patient ID: Christian King, male    DOB: [birth year], 45 y.o.   MRN: [id number]  Patient here today for follow up of chronic medical problems. Patient has not been seen since March 2017 for follow up of chronic medical problems- he has actually missed several appointments.  Current Outpatient Prescriptions on File Prior to Visit  Medication Sig Dispense Refill  . diphenhydrAMINE (BENADRYL) 25 MG tablet Take 25 mg by mouth every 6 (six) hours as needed for allergies.     . fenofibrate 54 MG tablet Take 1 tablet by mouth  daily 90 tablet 1  . hydrOXYzine (VISTARIL) 50 MG capsule Take 1 capsule (50 mg total) by mouth at bedtime as needed (insomnia). 30 capsule 0  . indomethacin (INDOCIN SR) 75 MG CR capsule Take 1 capsule (75 mg total) by mouth 2 (two) times daily with a meal. 30 capsule 1  . LORazepam (ATIVAN) 2 MG tablet TAKE ONE-HALF TABLET BY MOUTH ONCE DAILY AS NEEDED 30 tablet 0  . magnesium oxide (MAG-OX) 400 MG tablet Take 1 tablet (400 mg total) by mouth daily. 90 tablet 1  . metoprolol succinate (TOPROL-XL) 50 MG 24 hr tablet TAKE ONE TABLET BY MOUTH ONCE DAILY TAKE  WITH  OR  IMMEDIATELY  FOLLOWING  A  MEAL 90 tablet 1  . metoprolol succinate (TOPROL-XL) 50 MG 24 hr tablet Take 1 tablet by mouth  daily; with, or immediately following a meal 14 tablet 0  . metoprolol succinate (TOPROL-XL) 50 MG 24 hr tablet TAKE 1 TABLET BY MOUTH  DAILY WITH/ OR IMMEDIATELY  FOLLOWING A MEAL 90 tablet 0  . potassium chloride (K-DUR,KLOR-CON) 10 MEQ tablet Take 1 tablet by mouth  every day 90 tablet 1  . sildenafil (REVATIO) 20 MG tablet 2-5 prn sex 100 tablet 3  . simvastatin (ZOCOR) 40 MG tablet Take 1 tablet (40 mg total) by mouth daily. 90 tablet 1  . Vitamin D, Ergocalciferol, (DRISDOL) 50000 units CAPS capsule Take 1 capsule by mouth  once every week 12 capsule 0   No current facility-administered medications on file prior to visit.    Hypertension  This is a chronic problem.  The current episode started more than 1 year ago. The problem has been gradually improving since onset. The problem is uncontrolled. Pertinent negatives include no headaches, palpitations, peripheral edema or shortness of breath. Risk factors for coronary artery disease include dyslipidemia and male gender. The current treatment provides mild improvement. Compliance problems include diet and exercise.   Hyperlipidemia  This is a chronic problem. The current episode started more than 1 year ago. The problem is uncontrolled. Recent lipid tests were reviewed and are variable. Exacerbating diseases include obesity. He has no history of diabetes or hypothyroidism. Pertinent negatives include no myalgias or shortness of breath. Current antihyperlipidemic treatment includes statins and fibric acid derivatives. The current treatment provides moderate improvement of lipids. Compliance problems include adherence to diet and adherence to exercise.  Risk factors for coronary artery disease include dyslipidemia, hypertension and male sex.  Insomnia Pt takes 1/2 ativan about 3-4 nights a weeks- Sometimes taking in the mornings now. Works well with no complaints  Requesting Rx for vistaril for sleep.  Alcohol abuse Patient says that he still drinks daily. Has no desire to stop. He says that he does not drink beer anymore, only drinks wine. At least 2-3 glasses every evening. Hypokalemia Potassium supplement daily- having some cramps.   Review of Systems  Constitutional: Negative.  HENT: Negative.   Eyes: Negative.        Wears glasses  Respiratory: Negative.  Negative for shortness of breath.   Cardiovascular: Negative.  Negative for palpitations.  Gastrointestinal: Negative.   Endocrine: Negative.   Genitourinary: Negative.   Musculoskeletal: Negative.  Negative for myalgias.  Skin: Negative.   Allergic/Immunologic: Negative.   Neurological: Negative.  Negative for headaches.  Hematological: Negative.    Psychiatric/Behavioral: Negative.   All other systems reviewed and are negative.      Objective:   Physical Exam  Constitutional: He is oriented to person, place, and time. He appears well-developed and well-nourished.  HENT:  Head: Normocephalic.  Right Ear: External ear normal.  Left Ear: External ear normal.  Nose: Nose normal.  Mouth/Throat: Oropharynx is clear and moist.  Eyes: Conjunctivae and EOM are normal. Pupils are equal, round, and reactive to light.  Neck: Normal range of motion. Neck supple. No JVD present. No thyromegaly present.  Cardiovascular: Normal rate, regular rhythm, normal heart sounds and intact distal pulses.  Exam reveals no gallop and no friction rub.   No murmur heard. Pulmonary/Chest: Effort normal and breath sounds normal. No respiratory distress. He has no wheezes. He has no rales. He exhibits no tenderness.  Abdominal: Soft. He exhibits no mass. There is no tenderness.  Hyperactive bowel sounds  Musculoskeletal: Normal range of motion. He exhibits no edema.  Lymphadenopathy:    He has no cervical adenopathy.  Neurological: He is alert and oriented to person, place, and time. No cranial nerve deficit.  Skin: Skin is warm and dry.  Psychiatric: He has a normal mood and affect. His behavior is normal. Judgment and thought content normal.   BP 132/84 (BP Location: Left Arm, Cuff Size: Normal)   Pulse 93   Temp 97.4 F (36.3 C) (Oral)   Ht _0  (1.753 m)   Wt 174 lb (78.9 kg)   BMI 25.70 kg/m       Assessment & Plan:  1. Essential hypertension Low sodium diet - metoprolol succinate (TOPROL-XL) 50 MG 24 hr tablet; TAKE 1 TABLET BY MOUTH  DAILY WITH/ OR IMMEDIATELY  FOLLOWING A MEAL  Dispense: 90 tablet; Refill: 1 - CMP14+EGFR  2. Hyperlipidemia, unspecified hyperlipidemia type Low fat diet - simvastatin (ZOCOR) 40 MG tablet; Take 1 tablet (40 mg total) by mouth daily.  Dispense: 90 tablet; Refill: 1 - fenofibrate 54 MG tablet; Take 1  tablet by mouth  daily  Dispense: 90 tablet; Refill: 1 - Lipid panel  3. Hypokalemia - potassium chloride (K-DUR,KLOR-CON) 10 MEQ tablet; Take 1 tablet by mouth  every day  Dispense: 90 tablet; Refill: 1  4. Insomnia, unspecified type Bedtime routine - LORazepam (ATIVAN) 2 MG tablet; TAKE ONE-HALF TABLET BY MOUTH ONCE DAILY AS NEEDED  Dispense: 15 tablet; Refill: 1  5. Hypothyroidism, unspecified type  6. BMI 25.0-25.9,adult Discussed diet and exercise for person with BMI >25 Will recheck weight in 3-6 months   7. Hereditary hemochromatosis ([place]) - Anemia Profile B    Labs discussed with patient Health maintenance reviewed Diet and exercise encouraged Continue all meds Follow up  In 3 months   [place], FNP

## 2016-01-13 NOTE — Addendum Note (Signed)
Addended by: Bennie PieriniMARTIN, MARY-MARGARET on: 01/13/2016 04:31 PM   Modules accepted: Orders

## 2016-02-16 ENCOUNTER — Ambulatory Visit (INDEPENDENT_AMBULATORY_CARE_PROVIDER_SITE_OTHER): Payer: 59 | Admitting: Pediatrics

## 2016-02-16 VITALS — BP 142/90 | HR 120 | Temp 97.8°F | Resp 18 | Ht 69.0 in | Wt 177.2 lb

## 2016-02-16 DIAGNOSIS — J209 Acute bronchitis, unspecified: Secondary | ICD-10-CM | POA: Diagnosis not present

## 2016-02-16 MED ORDER — AZITHROMYCIN 250 MG PO TABS: ORAL_TABLET | ORAL | 0 refills | Status: DC

## 2016-02-16 NOTE — Progress Notes (Signed)
  Subjective:   Patient ID: Christian King, male    DOB: 04-28-1970, 45 y.o.   MRN: [id number] CC: URI (cough, congestion, wheezing x 3 wks)  HPI: Christian King is a 45 y.o. male presenting for URI (cough, congestion, wheezing x 3 wks)  Taking pseudo-fed, robitussin Missed christmas 3 days ago because feeling so bad, aching, congestion No fevers Lots of congestion, coughing ongoing Feels breathing is fine, sometimes wheezing at night  Relevant past medical, surgical, family and social history reviewed. Allergies and medications reviewed and updated. History  Smoking Status  . Former Smoker  . Packs/day: 0.20  . Quit date: 02/19/2010  Smokeless Tobacco  . Never Used   ROS: Per HPI   Objective:    BP (!) 142/90 (BP Location: Left Arm, Patient Position: Sitting, Cuff Size: Normal)   Pulse (!) 120   Temp 97.8 F (36.6 C) (Oral)   Resp 18   Ht 5\' 9"  (1.753 m)   Wt 177 lb 3.2 oz (80.4 kg)   SpO2 97%   BMI 26.17 kg/m   Wt Readings from Last 3 Encounters:  02/16/16 177 lb 3.2 oz (80.4 kg)  01/13/16 174 lb (78.9 kg)  05/12/15 166 lb (75.3 kg)    Gen: NAD, alert, cooperative with exam, NCAT EYES: EOMI, no conjunctival injection, or no icterus ENT:  TMs dull gray b/l, OP without erythema LYMPH: no cervical LAD CV: NRRR, normal S1/S2, no murmur, distal pulses 2+ b/l Resp: CTABL, no wheezes, normal WOB Ext: No edema, warm Neuro: Alert and oriented MSK: normal muscle bulk  Assessment & Plan:  Christian King was seen today for uri.  Diagnoses and all orders for this visit:  Acute bronchitis, unspecified organism Symptoms ongoing over three weeks Worsening this week Discussed symptom control Treat as below Nl exam, O2 sat, well appearing Originally prescribed azithromycin On reviewing chart and seeing h/o Qt prolongation will switch to amoxicillin, will call pt to notify Added azithro to allergy list for h/o QT prolongation  Follow up plan:  as needed Rex Krasarol Vincent,  MD [facility]

## 2016-02-16 NOTE — Patient Instructions (Signed)
Netipot with distilled water 2-3 times a day to clear out sinuses Or Normal saline nasal spray Flonase steroid nasal spray Antihistamine daily such as cetirizine Lots of fluids  

## 2016-02-17 MED ORDER — AMOXICILLIN 500 MG PO CAPS: 500.0000 mg | ORAL_CAPSULE | Freq: Two times a day (BID) | ORAL | 0 refills | Status: DC

## 2016-02-24 ENCOUNTER — Encounter: Payer: Self-pay | Admitting: Internal Medicine

## 2016-02-24 ENCOUNTER — Ambulatory Visit (INDEPENDENT_AMBULATORY_CARE_PROVIDER_SITE_OTHER): Payer: PRIVATE HEALTH INSURANCE | Admitting: Internal Medicine

## 2016-02-24 VITALS — BP 126/79 | HR 69 | Ht 68.0 in | Wt 174.0 lb

## 2016-02-24 DIAGNOSIS — I472 Ventricular tachycardia: Secondary | ICD-10-CM

## 2016-02-24 DIAGNOSIS — F1010 Alcohol abuse, uncomplicated: Secondary | ICD-10-CM | POA: Diagnosis not present

## 2016-02-24 DIAGNOSIS — R0602 Shortness of breath: Secondary | ICD-10-CM

## 2016-02-24 DIAGNOSIS — I4729 Other ventricular tachycardia: Secondary | ICD-10-CM

## 2016-02-24 NOTE — Patient Instructions (Addendum)
Medication Instructions:  Continue all current medications.  Labwork: none  Testing/Procedures:  Your physician has requested that you have an echocardiogram. Echocardiography is a painless test that uses sound waves to create images of your heart. It provides your doctor with information about the size and shape of your heart and how well your heart's chambers and valves are working. This procedure takes approximately one hour. There are no restrictions for this procedure.  Office will contact with results via phone or letter.    Follow-Up: Your physician wants you to follow up in:  1 year.  You will receive a reminder letter in the mail one-two months in advance.  If you don't receive a letter, please call our office to schedule the follow up appointment   Any Other Special Instructions Will Be Listed Below (If Applicable). Smoking cessation - info given.    If you need a refill on your cardiac medications before your next appointment, please call your pharmacy.

## 2016-02-24 NOTE — Progress Notes (Signed)
PCP: Bennie Pierini, FNP  The patient presents today for routine electrophysiology followup.  Since last being seen in our clinic, the patient reports doing very well.   Unfortunately, he has returned to alcohol and tobacco.  He has had no further VT for several years.  Tolerating metoprolol.  He has hip surgery planned for next month. + SOB at times Today, he denies symptoms of palpitations, chest pain, shortness of breath, orthopnea, PND, lower extremity edema, presyncope, syncope, or neurologic sequela.  The patient feels that he is tolerating medications without difficulties and is otherwise without complaint today.   Past Medical History:  Diagnosis Date  . Alcohol abuse   . B12 deficiency   . Bronchitis   . Gout   . HLD (hyperlipidemia)   . HTN (hypertension)   . Hypothyroidism   . Ventricular tachycardia (HCC)    in setting of hypokalemia/ hypomagnesemia and ETOH   Past Surgical History:  Procedure Laterality Date  . TOTAL HIP ARTHROPLASTY Right   . vascularized fibular graft     right hip for avascular necrosis    Current Outpatient Prescriptions  Medication Sig Dispense Refill  . amoxicillin (AMOXIL) 500 MG capsule Take 1 capsule (500 mg total) by mouth 2 (two) times daily. 14 capsule 0  . diphenhydrAMINE (BENADRYL) 25 MG tablet Take 25 mg by mouth every 6 (six) hours as needed for allergies.     . fenofibrate 54 MG tablet Take 1 tablet by mouth  daily 90 tablet 1  . hydrOXYzine (VISTARIL) 50 MG capsule Take 1 capsule (50 mg total) by mouth at bedtime as needed (insomnia). 30 capsule 0  . ibuprofen (ADVIL,MOTRIN) 200 MG tablet Take 200 mg by mouth every 6 (six) hours as needed.    . indomethacin (INDOCIN SR) 75 MG CR capsule Take 1 capsule (75 mg total) by mouth 2 (two) times daily with a meal. (Patient taking differently: Take 75 mg by mouth 2 (two) times daily as needed. ) 30 capsule 1  . LORazepam (ATIVAN) 2 MG tablet TAKE ONE-HALF TABLET BY MOUTH ONCE DAILY AS  NEEDED 15 tablet 1  . magnesium oxide (MAG-OX) 400 MG tablet Take 1 tablet (400 mg total) by mouth daily. 90 tablet 1  . meloxicam (MOBIC) 15 MG tablet Take 15 mg by mouth daily.    . metoprolol succinate (TOPROL-XL) 50 MG 24 hr tablet TAKE 1 TABLET BY MOUTH  DAILY WITH/ OR IMMEDIATELY  FOLLOWING A MEAL 90 tablet 1  . potassium chloride (K-DUR,KLOR-CON) 10 MEQ tablet Take 1 tablet by mouth  every day 90 tablet 1  . sildenafil (REVATIO) 20 MG tablet 2-5 prn sex 100 tablet 3  . simvastatin (ZOCOR) 40 MG tablet Take 1 tablet (40 mg total) by mouth daily. 90 tablet 1  . traMADol (ULTRAM) 50 MG tablet Take 50 mg by mouth every 6 (six) hours as needed.     . Vitamin D, Ergocalciferol, (DRISDOL) 50000 units CAPS capsule Take 1 capsule by mouth  once every week 12 capsule 0   No current facility-administered medications for this visit.     Allergies  Allergen Reactions  . Azithromycin     H/o QT prolongation  . Hctz [Hydrochlorothiazide] Rash    Social History   Social History  . Marital status: Married    Spouse name: N/A  . Number of children: N/A  . Years of education: N/A   Occupational History  . Not on file.   Social History Main Topics  .  Smoking status: Current Every Day Smoker    Packs/day: 0.25    Types: Cigarettes    Start date: 05/05/1985  . Smokeless tobacco: Never Used     Comment: quit in 12/2010 but restarted smoking 12-2014  . Alcohol use Yes     Comment: drinking since Thursday  . Drug use: No  . Sexual activity: Yes   Other Topics Concern  . Not on file   Social History Narrative   Unemployed.  Previously a Automotive engineerrealtor and insurance salesman   Physical Exam: Vitals:   02/24/16 0928  BP: 126/79  Pulse: 69  Weight: 174 lb (78.9 kg)  Height: 5\' 8"  (1.727 m)    GEN- The patient is well appearing, alert and oriented x 3 today.   Head- normocephalic, atraumatic Eyes-  Sclera clear, conjunctiva pink Ears- hearing intact Oropharynx- clear Neck- supple,    Lungs- Clear to ausculation bilaterally, normal work of breathing Heart- Regular rate and rhythm, no murmurs, rubs or gallops, PMI not laterally displaced GI- soft, NT, ND, + BS Extremities- no clubbing, cyanosis, or edema MS- no significant deformity or atrophy  ekg today reveals sinus rhythm 60 bpm, normal ekg  Assessment and Plan:  1. VT Well controlled, without symptomatic recurrence No changes I have offered to wean metoprolol but he declines Echo ordered today to evaluate for structural changes  2. ETOH  Cessation advised  3. Tobacco Cessation advised  Return in 12 months to see me  Preop: If echo is low risk, proceed with hip surgery.  Would continue metoprolol perioperatively  Hillis RangeJames Cortavius Montesinos MD, [facility] 02/24/2016 10:06 AM

## 2016-02-27 ENCOUNTER — Other Ambulatory Visit (HOSPITAL_COMMUNITY): Payer: Self-pay

## 2016-03-01 ENCOUNTER — Ambulatory Visit: Payer: Self-pay | Admitting: Physician Assistant

## 2016-03-01 ENCOUNTER — Telehealth: Payer: Self-pay | Admitting: Internal Medicine

## 2016-03-01 NOTE — H&P (Signed)
TOTAL HIP ADMISSION H&P  Patient is admitted for left total hip arthroplasty.  Subjective:  Chief Complaint: left hip pain  HPI: Christian King, 46 y.o. male, has a history of pain and functional disability in the left hip(s) due to arthritis and patient has failed non-surgical conservative treatments for greater than 12 weeks to include NSAID's and/or analgesics and activity modification.  Onset of symptoms was gradual starting 7 years ago with gradually worsening course since that time.The patient noted no past surgery on the left hip(s).  Patient currently rates pain in the left hip at 10 out of 10 with activity. Patient has night pain, worsening of pain with activity and weight bearing, pain that interfers with activities of daily living and pain with passive range of motion. 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 This condition presents safety issues increasing the risk of falls. This patient has had avascular necrosis of the hip, acetabular fracture, hip dysplasia.  There is no current active infection.  Patient Active Problem List   Diagnosis Date Noted  . QT prolongation 03/20/2015  . BMI 25.0-25.9,adult 01/28/2015  . Insomnia 02/05/2014  . Hemochromatosis 10/03/2013  . Alcohol abuse 07/24/2010  . Hypothyroidism 04/26/2010  . B12 DEFICIENCY 04/26/2010  . Hyperlipidemia 04/26/2010  . Gout 04/26/2010  . Essential hypertension 04/26/2010  . h/o VENTRICULAR TACHYCARDIA 04/26/2010   Past Medical History:  Diagnosis Date  . Alcohol abuse   . B12 deficiency   . Bronchitis   . Gout   . HLD (hyperlipidemia)   . HTN (hypertension)   . Hypothyroidism   . Ventricular tachycardia (HCC)    in setting of hypokalemia/ hypomagnesemia and ETOH    Past Surgical History:  Procedure Laterality Date  . TOTAL HIP ARTHROPLASTY Right   . vascularized fibular graft     right hip for avascular necrosis     (Not in a hospital admission) Allergies  Allergen  Reactions  . Azithromycin     H/o QT prolongation  . Tramadol Itching  . Hctz [Hydrochlorothiazide] Rash    Social History  Substance Use Topics  . Smoking status: Current Every Day Smoker    Packs/day: 0.25    Types: Cigarettes    Start date: 05/05/1985  . Smokeless tobacco: Never Used     Comment: quit in 12/2010 but restarted smoking 12-2014  . Alcohol use Yes     Comment: drinking since Thursday    Family History  Problem Relation Age of Onset  . Hypertension Father   . Diabetes Maternal Grandmother   . Hypertension Paternal Grandmother   . Stroke Paternal Grandmother      Review of Systems  Respiratory: Positive for cough.   Musculoskeletal: Positive for joint pain.  All other systems reviewed and are negative.   Objective:  Physical Exam  Constitutional: He is oriented to person, place, and time. He appears well-developed and well-nourished. No distress.  HENT:  Head: Normocephalic and atraumatic.  Nose: Nose normal.  Eyes: Conjunctivae and EOM are normal. Pupils are equal, round, and reactive to light.  Neck: Normal range of motion. Neck supple.  Cardiovascular: Normal rate, regular rhythm, normal heart sounds and intact distal pulses.   Respiratory: Effort normal and breath sounds normal. No respiratory distress. He has no wheezes.  GI: Soft. Bowel sounds are normal. He exhibits no distension. There is no tenderness.  Musculoskeletal:       Left hip: He exhibits decreased range of motion, tenderness and bony tenderness.  Lymphadenopathy:    He has no cervical adenopathy.  Neurological: He is alert and oriented to person, place, and time. No cranial nerve deficit.  Skin: Skin is warm and dry. No rash noted. No erythema.  Psychiatric: He has a normal mood and affect. His behavior is normal.    Vital signs in last 24 hours: @VSRANGES @  Labs:   Estimated body mass index is 26.46 kg/m as calculated from the following:   Height as of 02/24/16: 5\' 8"  (1.727  m).   Weight as of 02/24/16: 78.9 kg (174 lb).   Imaging Review Plain radiographs demonstrate moderate degenerative joint disease of the left hip(s). The bone quality appears to be good for age and reported activity level.  Assessment/Plan:  End stage arthritis, left hip(s)  The patient history, physical examination, clinical judgement of the provider and imaging studies are consistent with end stage degenerative joint disease of the left hip(s) and total hip arthroplasty is deemed medically necessary. The treatment options including medical management, injection therapy, arthroscopy and arthroplasty were discussed at length. The risks and benefits of total hip arthroplasty were presented and reviewed. The risks due to aseptic loosening, infection, stiffness, dislocation/subluxation,  thromboembolic complications and other imponderables were discussed.  The patient acknowledged the explanation, agreed to proceed with the plan and consent was signed. Patient is being admitted for inpatient treatment for surgery, pain control, PT, OT, prophylactic antibiotics, VTE prophylaxis, progressive ambulation and ADL's and discharge planning.The patient is planning to be discharged home with home health services

## 2016-03-01 NOTE — Telephone Encounter (Signed)
New Message  Cordelia PenSherry voiced from MD-Daniel's Caffery's office pt is schedule for surgery with hospital.  Cordelia PenSherry voiced can't proceed with surgery if echo is low risk.  Cordelia PenSherry voiced pt stated echo has not been done but MD Allred stated to proceed anyway but there's no verbal orders or notes.  Please f/u with Cordelia PenSherry to see if it's okay to still proceed.

## 2016-03-01 NOTE — Telephone Encounter (Signed)
Spoke with Dr Johney FrameAllred and he did not clear for surgery without echo.  Will need to reschedule echo and if low risk will be able to proceed with surgery

## 2016-03-01 NOTE — Telephone Encounter (Signed)
Spoke with Cordelia PenSherry and let her know patient did not show for his echo scheduled for 02/24/16.  She says the patient told her that Dr Johney FrameAllred said he would clear him without the echo(something to do with the patient's deductible).  I let Cordelia PenSherry know that Dr Johney FrameAllred is not here to say but there is nothing documented in the patient's chart stating that.  This is an [place] patient and I was not involved but she nothing documented stating he is cleared.  Will try and get in touch with Dr Johney FrameAllred to address.  Cordelia PenSherry is aware he is not back in the office until next Wed.

## 2016-03-05 ENCOUNTER — Ambulatory Visit (HOSPITAL_COMMUNITY)
Admission: RE | Admit: 2016-03-05 | Discharge: 2016-03-05 | Disposition: A | Discharge status: Home / Self Care | Payer: 59 | Source: Ambulatory Visit | Attending: Physician Assistant | Admitting: Physician Assistant

## 2016-03-05 ENCOUNTER — Encounter (HOSPITAL_COMMUNITY)
Admission: RE | Admit: 2016-03-05 | Discharge: 2016-03-05 | Disposition: A | Discharge status: Home / Self Care | Payer: 59 | Source: Ambulatory Visit | Attending: Orthopedic Surgery | Admitting: Orthopedic Surgery

## 2016-03-05 ENCOUNTER — Encounter (HOSPITAL_COMMUNITY): Payer: Self-pay

## 2016-03-05 DIAGNOSIS — Z01810 Encounter for preprocedural cardiovascular examination: Secondary | ICD-10-CM | POA: Diagnosis present

## 2016-03-05 DIAGNOSIS — M1612 Unilateral primary osteoarthritis, left hip: Secondary | ICD-10-CM | POA: Diagnosis not present

## 2016-03-05 HISTORY — DX: Gastro-esophageal reflux disease without esophagitis: K21.9

## 2016-03-05 HISTORY — DX: Osteoarthritis of hip, unspecified: M16.9

## 2016-03-05 HISTORY — DX: Presence of spectacles and contact lenses: Z97.3

## 2016-03-05 HISTORY — DX: Disease of blood and blood-forming organs, unspecified: D75.9

## 2016-03-05 HISTORY — DX: Personal history of urinary calculi: Z87.442

## 2016-03-05 LAB — SURGICAL PCR SCREEN
MRSA, PCR: NEGATIVE
Staphylococcus aureus: NEGATIVE

## 2016-03-05 LAB — TYPE AND SCREEN
ABO/RH(D): O POS
Antibody Screen: NEGATIVE

## 2016-03-05 LAB — CBC WITH DIFFERENTIAL/PLATELET
BASOS ABS: 0 10*3/uL (ref 0.0–0.1)
BLASTS: 0 %
Band Neutrophils: 0 %
Basophils Relative: 0 %
EOS PCT: 0 %
Eosinophils Absolute: 0 10*3/uL (ref 0.0–0.7)
HEMATOCRIT: 45.3 % (ref 39.0–52.0)
HEMOGLOBIN: 15.3 g/dL (ref 13.0–17.0)
Lymphocytes Relative: 12 %
Lymphs Abs: 0.7 10*3/uL (ref 0.7–4.0)
MCH: 36.3 pg — AB (ref 26.0–34.0)
MCHC: 33.8 g/dL (ref 30.0–36.0)
MCV: 107.6 fL — AB (ref 78.0–100.0)
METAMYELOCYTES PCT: 0 %
MYELOCYTES: 0 %
Monocytes Absolute: 0.4 10*3/uL (ref 0.1–1.0)
Monocytes Relative: 7 %
NEUTROS PCT: 81 %
NRBC: 0 /100{WBCs}
Neutro Abs: 4.9 10*3/uL (ref 1.7–7.7)
Other: 0 %
PLATELETS: 214 10*3/uL (ref 150–400)
Promyelocytes Absolute: 0 %
RBC: 4.21 MIL/uL — AB (ref 4.22–5.81)
RDW: 14.9 % (ref 11.5–15.5)
WBC: 6 10*3/uL (ref 4.0–10.5)

## 2016-03-05 LAB — COMPREHENSIVE METABOLIC PANEL
ALK PHOS: 86 U/L (ref 38–126)
ALT: 26 U/L (ref 17–63)
AST: 36 U/L (ref 15–41)
Albumin: 3.3 g/dL — ABNORMAL LOW (ref 3.5–5.0)
Anion gap: 12 (ref 5–15)
BILIRUBIN TOTAL: 0.7 mg/dL (ref 0.3–1.2)
BUN: 12 mg/dL (ref 6–20)
CALCIUM: 9.4 mg/dL (ref 8.9–10.3)
CHLORIDE: 100 mmol/L — AB (ref 101–111)
CO2: 24 mmol/L (ref 22–32)
CREATININE: 0.8 mg/dL (ref 0.61–1.24)
Glucose, Bld: 89 mg/dL (ref 65–99)
Potassium: 4 mmol/L (ref 3.5–5.1)
Sodium: 136 mmol/L (ref 135–145)
Total Protein: 7.3 g/dL (ref 6.5–8.1)

## 2016-03-05 LAB — URINALYSIS, ROUTINE W REFLEX MICROSCOPIC
Bilirubin Urine: NEGATIVE
Glucose, UA: NEGATIVE mg/dL
KETONES UR: NEGATIVE mg/dL
LEUKOCYTES UA: NEGATIVE
NITRITE: NEGATIVE
PH: 6 (ref 5.0–8.0)
PROTEIN: 30 mg/dL — AB
Specific Gravity, Urine: 1.025 (ref 1.005–1.030)

## 2016-03-05 LAB — URINALYSIS, MICROSCOPIC (REFLEX): WBC UA: NONE SEEN WBC/hpf (ref 0–5)

## 2016-03-05 LAB — APTT: APTT: 28 s (ref 24–36)

## 2016-03-05 LAB — PROTIME-INR
INR: 0.93
PROTHROMBIN TIME: 12.5 s (ref 11.4–15.2)

## 2016-03-05 NOTE — Progress Notes (Signed)
Pt denies SOB and chest pain but is under the care of Dr. Johney FrameAllred, Cardiology. Pt was made aware by MD that he must reschedule an appointment for an echo in order to get cardiac clearance from Dr. Johney FrameAllred, Cardiology. Pt denies having a cardiac cath but stated that a stress test was performed February 2012. Pt has medical clearance in "Media" in epic. Pt made aware that it is okay to continue taking Magnesium and Potassium ( per Revonda StandardAllison, [place], Anesthesia). Pt chart forwarded to anesthesia, awaiting echo results for cardiac clearance.

## 2016-03-05 NOTE — Pre-Procedure Instructions (Signed)
    Christian NuttingMichael L King  03/05/2016      [facility] [phone] [place] - [address] [facility] [address] [facility] [address] [place] [address] Phone: [phone] Fax: [phone]  [facility] [address] [place] - [place] [place] [address] [place] [address] [place] [address] Phone: [phone] Fax: [phone]  [facility] [place] - [id number] [place] [place] [id number] Phone: [phone] Fax: [phone]    Your procedure is scheduled on Friday, March 16, 2016  [facility].M.  Call this number if you have problems the morning of surgery:  [phone]   Remember:  Do not eat food or drink liquids after midnight Thursday, March 15, 2016  Take these medicines the morning of surgery with A SIP OF WATER : metoprolol succinate (TOPROL-XL), if needed: LORazepam (ATIVAN) for anxiety, eye drops Stop taking Aspirin, vitamins, fish oil and herbal medications. Do not take any NSAIDs ie: Ibuprofen, Advil, Naproxen, BC and Goody Powder or any medication containing Aspirin such as indomethacin (INDOCIN SR) and meloxicam [facility](MOBIC); stop Friday, March 09, 2016  Do not wear jewelry, make-up or nail polish.  Do not wear lotions, powders, or perfumes, or deoderant.  Do not shave 48 hours prior to surgery.  Men may shave face and neck.  Do not bring valuables to the hospital.  [facility] is not responsible for any belongings or valuables.  Contacts, dentures or bridgework may not be worn into surgery.  Leave your suitcase in the car.  After surgery it may be brought to your room. For patients admitted to the hospital, discharge time will be determined by your treatment team. Special instructions: Shower the night before surgery and the morning of surgery with CHG. Please read over the following fact sheets that you were given. Pain Booklet,  Coughing and Deep Breathing, Blood Transfusion Information, Total Joint Packet, MRSA Information and Surgical Site Infection Prevention

## 2016-03-06 ENCOUNTER — Ambulatory Visit (HOSPITAL_COMMUNITY)
Admission: RE | Admit: 2016-03-06 | Discharge: 2016-03-06 | Disposition: A | Discharge status: Home / Self Care | Payer: PRIVATE HEALTH INSURANCE | Source: Ambulatory Visit | Attending: Internal Medicine | Admitting: Internal Medicine

## 2016-03-06 DIAGNOSIS — R0602 Shortness of breath: Secondary | ICD-10-CM

## 2016-03-06 DIAGNOSIS — I472 Ventricular tachycardia: Secondary | ICD-10-CM

## 2016-03-06 DIAGNOSIS — I4729 Other ventricular tachycardia: Secondary | ICD-10-CM

## 2016-03-06 DIAGNOSIS — E785 Hyperlipidemia, unspecified: Secondary | ICD-10-CM | POA: Insufficient documentation

## 2016-03-06 DIAGNOSIS — I10 Essential (primary) hypertension: Secondary | ICD-10-CM | POA: Insufficient documentation

## 2016-03-06 DIAGNOSIS — F17200 Nicotine dependence, unspecified, uncomplicated: Secondary | ICD-10-CM | POA: Insufficient documentation

## 2016-03-06 LAB — URINE CULTURE

## 2016-03-06 NOTE — Progress Notes (Signed)
*  PRELIMINARY RESULTS* Echocardiogram 2D Echocardiogram has been performed.  Jeryl Columbialliott, Kennedy Brines 03/06/2016, 10:18 AM

## 2016-03-08 NOTE — Progress Notes (Addendum)
Anesthesia Chart Review:  Pt is a 46 year old male scheduled for L THA on 03/16/2016 with Frederico Hammananiel Caffrey, MD.   - PCP is Mary-Margaret Daphine DeutscherMartin, FNP, last office visit 01/13/16, who cleared pt for surgery.  - Cardiologist is Hillis RangeJames Allred, MD, last office visit 02/24/16. Echo ordered as part of pre-op eval (results below). Notes state if echo low risk, pt can proceed with surgery.   PMH includes:  Ventricular tachycardia (in the setting of hypokalemia, hypomagnesemia and ETOH), HTN, hyperlipidemia, alcohol abuse, hypothyroidism, hemachromatosis, B12 deficiency, GERD. Current smoker. BMI 25.5  Medications include: fenofibrate, magnesium, metoprolol, potassium, sildenafil, simvastatin  Preoperative labs reviewed.    CXR 03/05/16: No acute cardiopulmonary disease.  EKG 02/24/16: Sinus  Rhythm. Short PR syndrome  Echo 03/06/16:  - Left ventricle: The cavity size was normal. Wall thickness was normal. Systolic function was normal. The estimated ejection fraction was in the range of 55% to 60%. Wall motion was normal; there were no regional wall motion abnormalities. Left ventricular diastolic function parameters were normal. - Aortic valve: Valve area (VTI): 2.23 cm^2. Valve area (Vmax):  2.36 cm^2. - Technically adequate study.  Nuclear stress test 04/17/10: Probable normal perfusion and soft tissue attenuation (diaphragm)  Minimal change in the inferior base.  Does not appeart to be signif for ischemia.  No evidence of scar.  If no changes, I anticipate pt can proceed with surgery as scheduled.   Rica Mastngela Bryony Kaman, FNP-BC [facility]Anesthesiology Phone: [phone] 03/08/2016 1:47 PM

## 2016-03-15 MED ORDER — TRANEXAMIC ACID 1000 MG/10ML IV SOLN
1000.0000 mg | INTRAVENOUS | Status: AC
  Administered 2016-03-16: 1000 mg via INTRAVENOUS
  Filled 2016-03-15: qty 10

## 2016-03-16 ENCOUNTER — Inpatient Hospital Stay (HOSPITAL_COMMUNITY): Payer: PRIVATE HEALTH INSURANCE | Admitting: Certified Registered Nurse Anesthetist

## 2016-03-16 ENCOUNTER — Encounter (HOSPITAL_COMMUNITY): Payer: Self-pay | Admitting: *Deleted

## 2016-03-16 ENCOUNTER — Inpatient Hospital Stay (HOSPITAL_COMMUNITY)
Admission: RE | Admit: 2016-03-16 | Discharge: 2016-03-18 | DRG: 470 | Disposition: A | Discharge status: Home Health | Payer: PRIVATE HEALTH INSURANCE | Source: Ambulatory Visit | Attending: Orthopedic Surgery | Admitting: Orthopedic Surgery

## 2016-03-16 ENCOUNTER — Inpatient Hospital Stay (HOSPITAL_COMMUNITY): Payer: PRIVATE HEALTH INSURANCE

## 2016-03-16 ENCOUNTER — Inpatient Hospital Stay (HOSPITAL_COMMUNITY): Payer: PRIVATE HEALTH INSURANCE | Admitting: Emergency Medicine

## 2016-03-16 ENCOUNTER — Encounter (HOSPITAL_COMMUNITY)
Admission: RE | Disposition: A | Discharge status: Home Health | Payer: Self-pay | Source: Ambulatory Visit | Attending: Orthopedic Surgery

## 2016-03-16 DIAGNOSIS — K219 Gastro-esophageal reflux disease without esophagitis: Secondary | ICD-10-CM | POA: Diagnosis present

## 2016-03-16 DIAGNOSIS — E785 Hyperlipidemia, unspecified: Secondary | ICD-10-CM | POA: Diagnosis present

## 2016-03-16 DIAGNOSIS — G4700 Insomnia, unspecified: Secondary | ICD-10-CM | POA: Diagnosis present

## 2016-03-16 DIAGNOSIS — F17210 Nicotine dependence, cigarettes, uncomplicated: Secondary | ICD-10-CM | POA: Diagnosis present

## 2016-03-16 DIAGNOSIS — E039 Hypothyroidism, unspecified: Secondary | ICD-10-CM | POA: Diagnosis present

## 2016-03-16 DIAGNOSIS — M879 Osteonecrosis, unspecified: Principal | ICD-10-CM | POA: Diagnosis present

## 2016-03-16 DIAGNOSIS — I10 Essential (primary) hypertension: Secondary | ICD-10-CM | POA: Diagnosis present

## 2016-03-16 DIAGNOSIS — E876 Hypokalemia: Secondary | ICD-10-CM | POA: Diagnosis not present

## 2016-03-16 DIAGNOSIS — R9431 Abnormal electrocardiogram [ECG] [EKG]: Secondary | ICD-10-CM | POA: Diagnosis present

## 2016-03-16 DIAGNOSIS — M1612 Unilateral primary osteoarthritis, left hip: Secondary | ICD-10-CM

## 2016-03-16 DIAGNOSIS — F1010 Alcohol abuse, uncomplicated: Secondary | ICD-10-CM | POA: Diagnosis present

## 2016-03-16 DIAGNOSIS — Z96641 Presence of right artificial hip joint: Secondary | ICD-10-CM | POA: Diagnosis present

## 2016-03-16 DIAGNOSIS — M25552 Pain in left hip: Secondary | ICD-10-CM | POA: Diagnosis present

## 2016-03-16 DIAGNOSIS — M109 Gout, unspecified: Secondary | ICD-10-CM | POA: Diagnosis present

## 2016-03-16 HISTORY — DX: Unilateral primary osteoarthritis, left hip: M16.12

## 2016-03-16 HISTORY — PX: TOTAL HIP ARTHROPLASTY: SHX124

## 2016-03-16 SURGERY — ARTHROPLASTY, HIP, TOTAL,POSTERIOR APPROACH
Anesthesia: Monitor Anesthesia Care | Laterality: Left

## 2016-03-16 MED ORDER — POLYETHYLENE GLYCOL 3350 17 G PO PACK
17.0000 g | PACK | Freq: Every day | ORAL | Status: DC | PRN
  Filled 2016-03-16: qty 1

## 2016-03-16 MED ORDER — HYDROMORPHONE HCL 2 MG/ML IJ SOLN
1.0000 mg | INTRAMUSCULAR | Status: DC | PRN
  Administered 2016-03-16 – 2016-03-18 (×8): 1 mg via INTRAVENOUS
  Filled 2016-03-16 (×8): qty 1

## 2016-03-16 MED ORDER — OXYCODONE HCL 5 MG PO TABS
ORAL_TABLET | ORAL | Status: AC
  Administered 2016-03-16: 10 mg via ORAL
  Filled 2016-03-16: qty 2

## 2016-03-16 MED ORDER — EPINEPHRINE PF 1 MG/ML IJ SOLN
INTRAMUSCULAR | Status: AC
  Filled 2016-03-16: qty 1

## 2016-03-16 MED ORDER — LORAZEPAM 1 MG PO TABS
1.0000 mg | ORAL_TABLET | Freq: Every day | ORAL | Status: DC | PRN
  Administered 2016-03-16 – 2016-03-18 (×3): 1 mg via ORAL
  Filled 2016-03-16 (×3): qty 1

## 2016-03-16 MED ORDER — METOCLOPRAMIDE HCL 5 MG/ML IJ SOLN: 10.0000 mg | Freq: Once | INTRAMUSCULAR | Status: DC | PRN

## 2016-03-16 MED ORDER — ONDANSETRON HCL 4 MG PO TABS
4.0000 mg | ORAL_TABLET | Freq: Four times a day (QID) | ORAL | Status: DC | PRN
  Filled 2016-03-16: qty 1

## 2016-03-16 MED ORDER — SODIUM CHLORIDE 0.9 % IR SOLN
Status: DC | PRN
  Administered 2016-03-16: 3000 mL

## 2016-03-16 MED ORDER — PROPOFOL 500 MG/50ML IV EMUL
INTRAVENOUS | Status: DC | PRN
  Administered 2016-03-16: 75 ug/kg/min via INTRAVENOUS

## 2016-03-16 MED ORDER — FENOFIBRATE 54 MG PO TABS
54.0000 mg | ORAL_TABLET | Freq: Every day | ORAL | Status: DC
  Administered 2016-03-16 – 2016-03-18 (×3): 54 mg via ORAL
  Filled 2016-03-16 (×3): qty 1

## 2016-03-16 MED ORDER — SORBITOL 70 % SOLN
30.0000 mL | Freq: Every day | Status: DC | PRN
  Filled 2016-03-16: qty 30

## 2016-03-16 MED ORDER — 0.9 % SODIUM CHLORIDE (POUR BTL) OPTIME
TOPICAL | Status: DC | PRN
  Administered 2016-03-16: 1000 mL

## 2016-03-16 MED ORDER — METOPROLOL SUCCINATE ER 50 MG PO TB24
50.0000 mg | ORAL_TABLET | Freq: Every day | ORAL | Status: DC
  Administered 2016-03-17 – 2016-03-18 (×2): 50 mg via ORAL
  Filled 2016-03-16 (×4): qty 1

## 2016-03-16 MED ORDER — ACETAMINOPHEN 325 MG PO TABS
650.0000 mg | ORAL_TABLET | Freq: Four times a day (QID) | ORAL | Status: DC | PRN
  Administered 2016-03-17 – 2016-03-18 (×3): 650 mg via ORAL
  Filled 2016-03-16 (×4): qty 2

## 2016-03-16 MED ORDER — SODIUM CHLORIDE 0.9 % IV SOLN
2000.0000 mg | INTRAVENOUS | Status: AC
  Administered 2016-03-16 (×2): 2000 mg via TOPICAL
  Filled 2016-03-16: qty 20

## 2016-03-16 MED ORDER — BUPIVACAINE HCL (PF) 0.5 % IJ SOLN
INTRAMUSCULAR | Status: AC
  Filled 2016-03-16: qty 30

## 2016-03-16 MED ORDER — MENTHOL 3 MG MT LOZG: 1.0000 | LOZENGE | OROMUCOSAL | Status: DC | PRN

## 2016-03-16 MED ORDER — FLEET ENEMA 7-19 GM/118ML RE ENEM
1.0000 | ENEMA | Freq: Once | RECTAL | Status: DC | PRN
  Filled 2016-03-16: qty 1

## 2016-03-16 MED ORDER — BUPIVACAINE-EPINEPHRINE (PF) 0.5% -1:200000 IJ SOLN
INTRAMUSCULAR | Status: AC
  Filled 2016-03-16: qty 30

## 2016-03-16 MED ORDER — POTASSIUM CHLORIDE CRYS ER 10 MEQ PO TBCR
10.0000 meq | EXTENDED_RELEASE_TABLET | Freq: Every day | ORAL | Status: DC
  Administered 2016-03-16 – 2016-03-18 (×3): 10 meq via ORAL
  Filled 2016-03-16 (×3): qty 1

## 2016-03-16 MED ORDER — ONDANSETRON HCL 4 MG/2ML IJ SOLN
4.0000 mg | Freq: Four times a day (QID) | INTRAMUSCULAR | Status: DC | PRN
  Filled 2016-03-16: qty 2

## 2016-03-16 MED ORDER — LACTATED RINGERS IV SOLN
INTRAVENOUS | Status: DC | PRN
Start: 2016-03-16 — End: 2016-03-16
  Administered 2016-03-16: 07:00:00 via INTRAVENOUS

## 2016-03-16 MED ORDER — FENTANYL CITRATE (PF) 100 MCG/2ML IJ SOLN
25.0000 ug | INTRAMUSCULAR | Status: DC | PRN
  Administered 2016-03-16 (×2): 50 ug via INTRAVENOUS

## 2016-03-16 MED ORDER — METHOCARBAMOL 500 MG PO TABS
ORAL_TABLET | ORAL | Status: AC
  Administered 2016-03-16: 500 mg via ORAL
  Filled 2016-03-16: qty 1

## 2016-03-16 MED ORDER — MAGNESIUM OXIDE 400 (241.3 MG) MG PO TABS
400.0000 mg | ORAL_TABLET | Freq: Every day | ORAL | Status: DC
  Administered 2016-03-17 – 2016-03-18 (×2): 400 mg via ORAL
  Filled 2016-03-16 (×4): qty 1

## 2016-03-16 MED ORDER — FENTANYL CITRATE (PF) 100 MCG/2ML IJ SOLN
INTRAMUSCULAR | Status: AC
  Filled 2016-03-16: qty 2

## 2016-03-16 MED ORDER — ACETAMINOPHEN 650 MG RE SUPP
650.0000 mg | Freq: Four times a day (QID) | RECTAL | Status: DC | PRN
Start: 2016-03-16 — End: 2016-03-18
  Filled 2016-03-16: qty 1

## 2016-03-16 MED ORDER — MIDAZOLAM HCL 2 MG/2ML IJ SOLN
INTRAMUSCULAR | Status: AC
  Filled 2016-03-16: qty 2

## 2016-03-16 MED ORDER — HYDROCODONE-ACETAMINOPHEN 7.5-325 MG PO TABS: 1.0000 | ORAL_TABLET | Freq: Once | ORAL | Status: DC | PRN

## 2016-03-16 MED ORDER — SODIUM CHLORIDE 0.9 % IV SOLN
1000.0000 mg | Freq: Once | INTRAVENOUS | Status: AC
  Administered 2016-03-16: 1000 mg via INTRAVENOUS
  Filled 2016-03-16: qty 10

## 2016-03-16 MED ORDER — SODIUM CHLORIDE 0.9 % IV SOLN
INTRAVENOUS | Status: DC
  Administered 2016-03-16: 16:00:00 via INTRAVENOUS

## 2016-03-16 MED ORDER — MIDAZOLAM HCL 5 MG/5ML IJ SOLN
INTRAMUSCULAR | Status: DC | PRN
  Administered 2016-03-16: 2 mg via INTRAVENOUS

## 2016-03-16 MED ORDER — CEFAZOLIN SODIUM-DEXTROSE 2-4 GM/100ML-% IV SOLN
2.0000 g | INTRAVENOUS | Status: AC
  Administered 2016-03-16: 2 g via INTRAVENOUS
  Filled 2016-03-16: qty 100

## 2016-03-16 MED ORDER — HYDROMORPHONE HCL 1 MG/ML IJ SOLN: 1.0000 mg | INTRAMUSCULAR | Status: DC | PRN

## 2016-03-16 MED ORDER — FENTANYL CITRATE (PF) 100 MCG/2ML IJ SOLN
INTRAMUSCULAR | Status: AC
  Administered 2016-03-16: 50 ug via INTRAVENOUS
  Filled 2016-03-16: qty 2

## 2016-03-16 MED ORDER — PHENOL 1.4 % MT LIQD: 1.0000 | OROMUCOSAL | Status: DC | PRN

## 2016-03-16 MED ORDER — SIMVASTATIN 40 MG PO TABS
40.0000 mg | ORAL_TABLET | Freq: Every day | ORAL | Status: DC
  Administered 2016-03-16 – 2016-03-17 (×2): 40 mg via ORAL
  Filled 2016-03-16 (×2): qty 1

## 2016-03-16 MED ORDER — CHLORHEXIDINE GLUCONATE 4 % EX LIQD: 60.0000 mL | Freq: Once | CUTANEOUS | Status: DC

## 2016-03-16 MED ORDER — SODIUM CHLORIDE 0.9 % IV SOLN: INTRAVENOUS | Status: DC

## 2016-03-16 MED ORDER — METHOCARBAMOL 1000 MG/10ML IJ SOLN
500.0000 mg | Freq: Four times a day (QID) | INTRAVENOUS | Status: DC | PRN
  Filled 2016-03-16: qty 5

## 2016-03-16 MED ORDER — PROPOFOL 10 MG/ML IV BOLUS
INTRAVENOUS | Status: AC
  Filled 2016-03-16: qty 20

## 2016-03-16 MED ORDER — CEFAZOLIN IN D5W 1 GM/50ML IV SOLN
1.0000 g | Freq: Four times a day (QID) | INTRAVENOUS | Status: AC
  Administered 2016-03-16 (×2): 1 g via INTRAVENOUS
  Filled 2016-03-16 (×2): qty 50

## 2016-03-16 MED ORDER — OXYCODONE-ACETAMINOPHEN 5-325 MG PO TABS: 1.0000 | ORAL_TABLET | ORAL | 0 refills | Status: DC | PRN

## 2016-03-16 MED ORDER — DOCUSATE SODIUM 100 MG PO CAPS
100.0000 mg | ORAL_CAPSULE | Freq: Two times a day (BID) | ORAL | Status: DC
  Administered 2016-03-16 – 2016-03-18 (×4): 100 mg via ORAL
  Filled 2016-03-16 (×4): qty 1

## 2016-03-16 MED ORDER — APIXABAN 2.5 MG PO TABS: 2.5000 mg | ORAL_TABLET | Freq: Two times a day (BID) | ORAL | 0 refills | Status: DC

## 2016-03-16 MED ORDER — APIXABAN 2.5 MG PO TABS
2.5000 mg | ORAL_TABLET | Freq: Two times a day (BID) | ORAL | Status: DC
  Administered 2016-03-17 – 2016-03-18 (×3): 2.5 mg via ORAL
  Filled 2016-03-16 (×4): qty 1

## 2016-03-16 MED ORDER — OXYCODONE HCL 5 MG PO TABS
5.0000 mg | ORAL_TABLET | ORAL | Status: DC | PRN
  Administered 2016-03-16 – 2016-03-18 (×10): 10 mg via ORAL
  Filled 2016-03-16 (×10): qty 2

## 2016-03-16 MED ORDER — METHOCARBAMOL 500 MG PO TABS
500.0000 mg | ORAL_TABLET | Freq: Four times a day (QID) | ORAL | Status: DC | PRN
  Administered 2016-03-16 – 2016-03-18 (×5): 500 mg via ORAL
  Filled 2016-03-16 (×6): qty 1

## 2016-03-16 SURGICAL SUPPLY — 58 items
BLADE SAW SAG 73X25 THK (BLADE) ×1
BLADE SAW SGTL 73X25 THK (BLADE) ×1 IMPLANT
BRUSH FEMORAL CANAL (MISCELLANEOUS) IMPLANT
CAPT HIP TOTAL 2 ×2 IMPLANT
COVER SURGICAL LIGHT HANDLE (MISCELLANEOUS) ×2 IMPLANT
DRAPE INCISE IOBAN 66X45 STRL (DRAPES) IMPLANT
DRAPE ORTHO SPLIT 77X108 STRL (DRAPES) ×2
DRAPE SURG ORHT 6 SPLT 77X108 (DRAPES) ×2 IMPLANT
DRAPE U-SHAPE 47X51 STRL (DRAPES) ×2 IMPLANT
DRSG ADAPTIC 3X8 NADH LF (GAUZE/BANDAGES/DRESSINGS) ×2 IMPLANT
DRSG PAD ABDOMINAL 8X10 ST (GAUZE/BANDAGES/DRESSINGS) ×2 IMPLANT
DURAPREP 26ML APPLICATOR (WOUND CARE) ×2 IMPLANT
ELECT BLADE 6.5 EXT (BLADE) IMPLANT
ELECT CAUTERY BLADE 6.4 (BLADE) ×2 IMPLANT
ELECT REM PT RETURN 9FT ADLT (ELECTROSURGICAL) ×2
ELECTRODE REM PT RTRN 9FT ADLT (ELECTROSURGICAL) ×1 IMPLANT
FACESHIELD WRAPAROUND (MASK) ×4 IMPLANT
GAUZE SPONGE 4X4 12PLY STRL (GAUZE/BANDAGES/DRESSINGS) ×2 IMPLANT
GLOVE BIOGEL PI IND STRL 8 (GLOVE) ×2 IMPLANT
GLOVE BIOGEL PI INDICATOR 8 (GLOVE) ×2
GLOVE ORTHO TXT STRL SZ7.5 (GLOVE) ×4 IMPLANT
GLOVE SURG ORTHO 8.0 STRL STRW (GLOVE) ×4 IMPLANT
GOWN STRL REUS W/ TWL LRG LVL3 (GOWN DISPOSABLE) ×1 IMPLANT
GOWN STRL REUS W/ TWL XL LVL3 (GOWN DISPOSABLE) ×1 IMPLANT
GOWN STRL REUS W/TWL 2XL LVL3 (GOWN DISPOSABLE) ×2 IMPLANT
GOWN STRL REUS W/TWL LRG LVL3 (GOWN DISPOSABLE) ×1
GOWN STRL REUS W/TWL XL LVL3 (GOWN DISPOSABLE) ×1
HANDPIECE INTERPULSE COAX TIP (DISPOSABLE)
HOOD PEEL AWAY FACE SHEILD DIS (HOOD) ×2 IMPLANT
IMMOBILIZER KNEE 22 (SOFTGOODS) ×2 IMPLANT
IMMOBILIZER KNEE 22 UNIV (SOFTGOODS) ×2 IMPLANT
KIT BASIN OR (CUSTOM PROCEDURE TRAY) ×2 IMPLANT
KIT ROOM TURNOVER OR (KITS) ×2 IMPLANT
MANIFOLD NEPTUNE II (INSTRUMENTS) ×2 IMPLANT
NEEDLE 22X1 1/2 (OR ONLY) (NEEDLE) ×2 IMPLANT
NEEDLE MAYO TROCAR (NEEDLE) IMPLANT
NS IRRIG 1000ML POUR BTL (IV SOLUTION) ×2 IMPLANT
PACK TOTAL JOINT (CUSTOM PROCEDURE TRAY) ×2 IMPLANT
PACK UNIVERSAL I (CUSTOM PROCEDURE TRAY) ×2 IMPLANT
PAD ARMBOARD 7.5X6 YLW CONV (MISCELLANEOUS) ×4 IMPLANT
PRESSURIZER FEMORAL UNIV (MISCELLANEOUS) IMPLANT
SET HNDPC FAN SPRY TIP SCT (DISPOSABLE) IMPLANT
SPONGE GAUZE 4X4 12PLY STER LF (GAUZE/BANDAGES/DRESSINGS) ×2 IMPLANT
STAPLER VISISTAT 35W (STAPLE) ×2 IMPLANT
SUCTION FRAZIER HANDLE 10FR (MISCELLANEOUS) ×1
SUCTION TUBE FRAZIER 10FR DISP (MISCELLANEOUS) ×1 IMPLANT
SUT ETHIBOND 2 V 37 (SUTURE) ×2 IMPLANT
SUT VIC AB 0 CT1 27 (SUTURE) ×1
SUT VIC AB 0 CT1 27XBRD ANBCTR (SUTURE) ×1 IMPLANT
SUT VIC AB 2-0 CT1 27 (SUTURE) ×2
SUT VIC AB 2-0 CT1 TAPERPNT 27 (SUTURE) ×2 IMPLANT
SYR CONTROL 10ML LL (SYRINGE) ×2 IMPLANT
TAPE CLOTH SURG 6X10 WHT LF (GAUZE/BANDAGES/DRESSINGS) ×2 IMPLANT
TOWEL OR 17X24 6PK STRL BLUE (TOWEL DISPOSABLE) ×2 IMPLANT
TOWEL OR 17X26 10 PK STRL BLUE (TOWEL DISPOSABLE) ×2 IMPLANT
TOWER CARTRIDGE SMART MIX (DISPOSABLE) IMPLANT
TRAY CATH 16FR W/PLASTIC CATH (SET/KITS/TRAYS/PACK) IMPLANT
WATER STERILE IRR 1000ML POUR (IV SOLUTION) ×2 IMPLANT

## 2016-03-16 NOTE — Anesthesia Procedure Notes (Signed)
Spinal  Start time: 03/16/2016 7:30 AM End time: 03/16/2016 7:35 AM Staffing Anesthesiologist: Noreene LarssonJOSLIN, Kenzey Birkland Performed: anesthesiologist  Preanesthetic Checklist Completed: patient identified, site marked, surgical consent, pre-op evaluation, timeout performed, IV checked, risks and benefits discussed and monitors and equipment checked Spinal Block Patient position: sitting Prep: Betadine Patient monitoring: heart rate, cardiac monitor, continuous pulse ox and blood pressure Approach: midline Location: L3-4 Needle Needle type: Pencan  Needle gauge: 24 G Assessment Sensory level: T6 Additional Notes 15 mg 0.75% Bupivacaine

## 2016-03-16 NOTE — Anesthesia Procedure Notes (Signed)
Procedure Name: MAC Date/Time: 03/16/2016 7:35 AM Performed by: Oletta Lamas Pre-anesthesia Checklist: Patient identified, Emergency Drugs available, Suction available and Patient being monitored Patient Re-evaluated:Patient Re-evaluated prior to inductionOxygen Delivery Method: Simple face mask Preoxygenation: Pre-oxygenation with 100% oxygen

## 2016-03-16 NOTE — Progress Notes (Signed)
Patient arrived from PACU via bed to the unit. He began to complain of pain and rated his pain at 10/10. Dilaudid was administered and he went to sleep for about an hour, then his girlfriend who is at the bedside called that, patient was in pain, oxy 10 mg was administered then she called in an hour to demand IV dilaudid because pain medication was not working. Medication was administered. Patient is now lethargic. He awakens to tactile stimuli but goes back to sleep almost immediately, Respiration is 17, Pulse is 98%.

## 2016-03-16 NOTE — Anesthesia Postprocedure Evaluation (Addendum)
Anesthesia Post Note  Patient: Christian King  Procedure(s) Performed: Procedure(s) (LRB): TOTAL HIP ARTHROPLASTY (Left)  Patient location during evaluation: PACU Anesthesia Type: MAC Level of consciousness: awake and oriented Pain management: pain level controlled Vital Signs Assessment: post-procedure vital signs reviewed and stable Respiratory status: spontaneous breathing, nonlabored ventilation and respiratory function stable Cardiovascular status: blood pressure returned to baseline Anesthetic complications: no       Last Vitals:  Vitals:   03/16/16 1330 03/16/16 1400  BP: 126/77 114/82  Pulse: 70 74  Resp: 16   Temp: 36.5 C 37 C    Last Pain:  Vitals:   03/16/16 1407  TempSrc:   PainSc: 4                  Alayja Armas COKER

## 2016-03-16 NOTE — Interval H&P Note (Signed)
History and Physical Interval Note:  03/16/2016 7:26 AM  Christian King  has presented today for surgery, with the diagnosis of OA LEFT HIP  The various methods of treatment have been discussed with the patient and family. After consideration of risks, benefits and other options for treatment, the patient has consented to  Procedure(s): TOTAL HIP ARTHROPLASTY (Left) as a surgical intervention .  The patient's history has been reviewed, patient examined, no change in status, stable for surgery.  I have reviewed the patient's chart and labs.  Questions were answered to the patient's satisfaction.     [name],W D

## 2016-03-16 NOTE — Anesthesia Preprocedure Evaluation (Addendum)
Anesthesia Evaluation  Patient identified by MRN, date of birth, ID band Patient awake    Reviewed: Allergy & Precautions, NPO status   Airway Mallampati: II  TM Distance: >3 FB Neck ROM: Full    Dental  (+) Teeth Intact, Dental Advisory Given   Pulmonary Current Smoker,    breath sounds clear to auscultation       Cardiovascular hypertension,  Rhythm:Regular Rate:Normal     Neuro/Psych    GI/Hepatic   Endo/Other    Renal/GU      Musculoskeletal   Abdominal   Peds  Hematology   Anesthesia Other Findings   Reproductive/Obstetrics                            Anesthesia Physical Anesthesia Plan  ASA: III  Anesthesia Plan: MAC and Spinal   Post-op Pain Management:    Induction:   Airway Management Planned:   Additional Equipment:   Intra-op Plan:   Post-operative Plan:   Informed Consent: I have reviewed the patients History and Physical, chart, labs and discussed the procedure including the risks, benefits and alternatives for the proposed anesthesia with the patient or authorized representative who has indicated his/her understanding and acceptance.     Plan Discussed with: CRNA and Anesthesiologist  Anesthesia Plan Comments:         Anesthesia Quick Evaluation

## 2016-03-16 NOTE — H&P (View-Only) (Signed)
TOTAL HIP ADMISSION H&P  Patient is admitted for left total hip arthroplasty.  Subjective:  Chief Complaint: left hip pain  HPI: Christian King, 46 y.o. male, has a history of pain and functional disability in the left hip(s) due to arthritis and patient has failed non-surgical conservative treatments for greater than 12 weeks to include NSAID's and/or analgesics and activity modification.  Onset of symptoms was gradual starting 7 years ago with gradually worsening course since that time.The patient noted no past surgery on the left hip(s).  Patient currently rates pain in the left hip at 10 out of 10 with activity. Patient has night pain, worsening of pain with activity and weight bearing, pain that interfers with activities of daily living and pain with passive range of motion. 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 This condition presents safety issues increasing the risk of falls. This patient has had avascular necrosis of the hip, acetabular fracture, hip dysplasia.  There is no current active infection.  Patient Active Problem List   Diagnosis Date Noted  . QT prolongation 03/20/2015  . BMI 25.0-25.9,adult 01/28/2015  . Insomnia 02/05/2014  . Hemochromatosis 10/03/2013  . Alcohol abuse 07/24/2010  . Hypothyroidism 04/26/2010  . B12 DEFICIENCY 04/26/2010  . Hyperlipidemia 04/26/2010  . Gout 04/26/2010  . Essential hypertension 04/26/2010  . h/o VENTRICULAR TACHYCARDIA 04/26/2010   Past Medical History:  Diagnosis Date  . Alcohol abuse   . B12 deficiency   . Bronchitis   . Gout   . HLD (hyperlipidemia)   . HTN (hypertension)   . Hypothyroidism   . Ventricular tachycardia (HCC)    in setting of hypokalemia/ hypomagnesemia and ETOH    Past Surgical History:  Procedure Laterality Date  . TOTAL HIP ARTHROPLASTY Right   . vascularized fibular graft     right hip for avascular necrosis     (Not in a hospital admission) Allergies  Allergen  Reactions  . Azithromycin     H/o QT prolongation  . Tramadol Itching  . Hctz [Hydrochlorothiazide] Rash    Social History  Substance Use Topics  . Smoking status: Current Every Day Smoker    Packs/day: 0.25    Types: Cigarettes    Start date: 05/05/1985  . Smokeless tobacco: Never Used     Comment: quit in 12/2010 but restarted smoking 12-2014  . Alcohol use Yes     Comment: drinking since Thursday    Family History  Problem Relation Age of Onset  . Hypertension Father   . Diabetes Maternal Grandmother   . Hypertension Paternal Grandmother   . Stroke Paternal Grandmother      Review of Systems  Respiratory: Positive for cough.   Musculoskeletal: Positive for joint pain.  All other systems reviewed and are negative.   Objective:  Physical Exam  Constitutional: He is oriented to person, place, and time. He appears well-developed and well-nourished. No distress.  HENT:  Head: Normocephalic and atraumatic.  Nose: Nose normal.  Eyes: Conjunctivae and EOM are normal. Pupils are equal, round, and reactive to light.  Neck: Normal range of motion. Neck supple.  Cardiovascular: Normal rate, regular rhythm, normal heart sounds and intact distal pulses.   Respiratory: Effort normal and breath sounds normal. No respiratory distress. He has no wheezes.  GI: Soft. Bowel sounds are normal. He exhibits no distension. There is no tenderness.  Musculoskeletal:       Left hip: He exhibits decreased range of motion, tenderness and bony tenderness.  Lymphadenopathy:    He has no cervical adenopathy.  Neurological: He is alert and oriented to person, place, and time. No cranial nerve deficit.  Skin: Skin is warm and dry. No rash noted. No erythema.  Psychiatric: He has a normal mood and affect. His behavior is normal.    Vital signs in last 24 hours: @VSRANGES @  Labs:   Estimated body mass index is 26.46 kg/m as calculated from the following:   Height as of 02/24/16: 5\' 8"  (1.727  m).   Weight as of 02/24/16: 78.9 kg (174 lb).   Imaging Review Plain radiographs demonstrate moderate degenerative joint disease of the left hip(s). The bone quality appears to be good for age and reported activity level.  Assessment/Plan:  End stage arthritis, left hip(s)  The patient history, physical examination, clinical judgement of the provider and imaging studies are consistent with end stage degenerative joint disease of the left hip(s) and total hip arthroplasty is deemed medically necessary. The treatment options including medical management, injection therapy, arthroscopy and arthroplasty were discussed at length. The risks and benefits of total hip arthroplasty were presented and reviewed. The risks due to aseptic loosening, infection, stiffness, dislocation/subluxation,  thromboembolic complications and other imponderables were discussed.  The patient acknowledged the explanation, agreed to proceed with the plan and consent was signed. Patient is being admitted for inpatient treatment for surgery, pain control, PT, OT, prophylactic antibiotics, VTE prophylaxis, progressive ambulation and ADL's and discharge planning.The patient is planning to be discharged home with home health services

## 2016-03-16 NOTE — Progress Notes (Signed)
PT Cancellation Note  Patient Details Name: Christian King MRN: [id number] DOB: [birth year]   Cancelled Treatment:    Reason Eval/Treat Not Completed: Other (comment) (Need clarification of weight bearing status.  Nurse to call.)   Christian King 03/16/2016, 3:31 PM [facility] Ron[facility] Plunk,PT Acute Rehabilitation [phone] (pager)

## 2016-03-16 NOTE — Brief Op Note (Signed)
03/16/2016  9:56 AM  PATIENT:  Christian King  46 y.o. male  PRE-OPERATIVE DIAGNOSIS:  OA LEFT HIP  POST-OPERATIVE DIAGNOSIS:  OA LEFT HIP  PROCEDURE:  Procedure(s): TOTAL HIP ARTHROPLASTY (Left)  SURGEON:  Surgeon(s) and Role:    * Frederico Hammananiel Caffrey, MD - Primary  PHYSICIAN ASSISTANT: Margart SicklesJoshua Forever Arechiga, PA-C  ASSISTANTS: OR staff x1  ANESTHESIA:   spinal and IV sedation  EBL:  Total I/O In: -  Out: 350 [Blood:350]  BLOOD ADMINISTERED:none  DRAINS: none   LOCAL MEDICATIONS USED:  NONE  SPECIMEN:  No Specimen  DISPOSITION OF SPECIMEN:  N/A  COUNTS:  YES  TOURNIQUET:  * No tourniquets in log *  DICTATION: .Other Dictation: Dictation Number unknown  PLAN OF CARE: Admit to inpatient   PATIENT DISPOSITION:  PACU - hemodynamically stable.   Delay start of Pharmacological VTE agent (>24hrs) due to surgical blood loss or risk of bleeding: yes

## 2016-03-16 NOTE — Transfer of Care (Signed)
Immediate Anesthesia Transfer of Care Note  Patient: Christian King  Procedure(s) Performed: Procedure(s): TOTAL HIP ARTHROPLASTY (Left)  Patient Location: PACU  Anesthesia Type:MAC and Spinal  Level of Consciousness:Drowsy. Resting eyes closed.   Airway & Oxygen Therapy: Patient Spontanous Breathing and Patient connected to face mask oxygen  Post-op Assessment: Report given to RN and Post -op Vital signs reviewed and stable  Post vital signs: Reviewed and stable  Last Vitals:  Vitals:   03/16/16 0618 03/16/16 1000  BP:  100/76  Pulse:  77  Resp:  16  Temp: 36.8 C (P) 36.4 C    Last Pain:  Vitals:   03/16/16 1000  TempSrc:   PainSc: (P) Asleep      Patients Stated Pain Goal: 0 (19/69/40 9828)  Complications: No apparent anesthesia complications

## 2016-03-17 DIAGNOSIS — E876 Hypokalemia: Secondary | ICD-10-CM

## 2016-03-17 HISTORY — DX: Hypokalemia: E87.6

## 2016-03-17 LAB — CBC
HCT: 35.9 % — ABNORMAL LOW (ref 39.0–52.0)
Hemoglobin: 12.1 g/dL — ABNORMAL LOW (ref 13.0–17.0)
MCH: 35.8 pg — AB (ref 26.0–34.0)
MCHC: 33.7 g/dL (ref 30.0–36.0)
MCV: 106.2 fL — AB (ref 78.0–100.0)
PLATELETS: 167 10*3/uL (ref 150–400)
RBC: 3.38 MIL/uL — ABNORMAL LOW (ref 4.22–5.81)
RDW: 14.7 % (ref 11.5–15.5)
WBC: 9.4 10*3/uL (ref 4.0–10.5)

## 2016-03-17 LAB — BASIC METABOLIC PANEL
ANION GAP: 13 (ref 5–15)
CALCIUM: 8.3 mg/dL — AB (ref 8.9–10.3)
CO2: 23 mmol/L (ref 22–32)
Chloride: 96 mmol/L — ABNORMAL LOW (ref 101–111)
Creatinine, Ser: 0.84 mg/dL (ref 0.61–1.24)
GFR calc Af Amer: >60 mL/min (ref >60)
GLUCOSE: 85 mg/dL (ref 65–99)
Potassium: 3 mmol/L — ABNORMAL LOW (ref 3.5–5.1)
SODIUM: 132 mmol/L — AB (ref 135–145)

## 2016-03-17 MED ORDER — POTASSIUM CHLORIDE CRYS ER 20 MEQ PO TBCR
20.0000 meq | EXTENDED_RELEASE_TABLET | Freq: Two times a day (BID) | ORAL | Status: AC
  Administered 2016-03-17 (×2): 20 meq via ORAL
  Filled 2016-03-17 (×2): qty 1

## 2016-03-17 NOTE — Evaluation (Addendum)
Occupational Therapy Evaluation Patient Details Name: Christian King MRN: [id number] DOB: [birth year] Today's Date: 03/17/2016    History of Present Illness Pt is a 46 y.o. male s/p L THA posterior approach. PMH significant for but not limited to insomnia, hemochromatosis, alcohol abuse, B12 deficiency, brochnitis, gout, HLD, HTN, hypothyroisism,  and ventricular tachycardia.   Clinical Impression   PTA, pt was independent with ADL and functional mobility. Pt demonstrates functional decline post-operatively and currently requires mod assist with LB ADL and min assist for toilet transfers. Pt would benefit from continued OT services while admitted to improve independence with ADL and facilitate maximum return to PLOF. Pt will have 24 hour assistance at home for 1 week and intermittent after that point. Recommend 24 hour assistance and no OT follow-up post-acute D/C. Educated pt and significant other on compensatory ADL strategies and safe toilet transfers and they verbalize and demonstrate understanding. OT will continue to follow acutely with focus on AE education and shower transfers.  Of note, during co-evaluation with PT, on ambulation out of room, pt reporting dizziness. BP 105/79 standing, SpO2 98%, and HR ranging from 142-145. Limited functional mobility at this point due to HR and symptoms and assisted pt back to room to recliner. HR remained in the 140 range. RN notified and monitoring.    Follow Up Recommendations  No OT follow up;Supervision/Assistance - 24 hour    Equipment Recommendations  3 in 1 bedside commode;Other (comment) (AE kit)    Recommendations for Other Services       Precautions / Restrictions Precautions Precautions: Fall;Posterior Hip Precaution Booklet Issued: Yes (comment) Precaution Comments: Reviewed handout with pt and wife. Restrictions Weight Bearing Restrictions: Yes LLE Weight Bearing: Weight bearing as tolerated      Mobility Bed  Mobility Overal bed mobility: Needs Assistance Bed Mobility: Supine to Sit     Supine to sit: Supervision;HOB elevated        Transfers Overall transfer level: Needs assistance Equipment used: Rolling walker (2 wheeled) Transfers: Sit to/from Stand Sit to Stand: Min assist              Balance Overall balance assessment: Needs assistance Sitting-balance support: No upper extremity supported;Feet supported Sitting balance-Leahy Scale: Good     Standing balance support: Bilateral upper extremity supported;No upper extremity supported;During functional activity Standing balance-Leahy Scale: Fair Standing balance comment: Able to statically stand at sink to wash hands with no UE support but reliant on RW for B UE support during functional mobility.                            ADL Overall ADL's : Needs assistance/impaired     Grooming: Min guard;Standing;Wash/dry hands   Upper Body Bathing: Set up;Sitting   Lower Body Bathing: Moderate assistance;Sit to/from stand   Upper Body Dressing : Set up;Sitting   Lower Body Dressing: Moderate assistance;Sit to/from stand   Toilet Transfer: Minimal assistance;Ambulation;BSC;RW   Toileting- Clothing Manipulation and Hygiene: Minimal assistance;Sit to/from stand       Functional mobility during ADLs: Minimal assistance;Rolling walker General ADL Comments: Pt and significant other educated concerning posterior hip precautions during ADL, use of AE for LB ADL, safe use of RW during ADL as well as compensatory ADL strategies.      Vision Vision Assessment?: No apparent visual deficits   Perception     Praxis      Pertinent Vitals/Pain Pain Assessment: 0-10 Pain Score: 6  Pain  Location: L hip Pain Descriptors / Indicators: Aching;Grimacing;Operative site guarding;Sore Pain Intervention(s): Limited activity within patient's tolerance;Monitored during session;Repositioned;Ice applied     Hand Dominance  Right   Extremity/Trunk Assessment Upper Extremity Assessment Upper Extremity Assessment: Overall WFL for tasks assessed   Lower Extremity Assessment Lower Extremity Assessment: LLE deficits/detail LLE Deficits / Details: Decreased strength and ROM as expected post-operatively.       Communication Communication Communication: No difficulties   Cognition Arousal/Alertness: Awake/alert Behavior During Therapy: WFL for tasks assessed/performed Overall Cognitive Status: Within Functional Limits for tasks assessed                     General Comments       Exercises       Shoulder Instructions      Home Living Family/patient expects to be discharged to:: Private residence Living Arrangements: Spouse/significant other Available Help at Discharge: Family;Available 24 hours/day (Significant other for 1 week) Type of Home: House Home Access: Stairs to enter [employer] of Steps: 5 Entrance Stairs-Rails: Right;Left (cannot reach both) Home Layout: Two level;Able to live on main level with bedroom/bathroom     Bathroom Shower/Tub: Occupational psychologist: Standard     Home Equipment: Shower seat          Prior Functioning/Environment Level of Independence: Independent with assistive device(s)        Comments: Using cane         OT Problem List: Decreased strength;Decreased range of motion;Decreased activity tolerance;Impaired balance (sitting and/or standing);Decreased safety awareness;Decreased knowledge of use of DME or AE;Decreased knowledge of precautions;Pain   OT Treatment/Interventions: Self-care/ADL training;Therapeutic exercise;Therapeutic activities;Patient/family education;Balance training;DME and/or AE instruction    OT Goals(Current goals can be found in the care plan section) Acute Rehab OT Goals Patient Stated Goal: to go home OT Goal Formulation: With patient/family Time For Goal Achievement: 03/24/16 Potential to  Achieve Goals: Good ADL Goals Pt Will Perform Lower Body Bathing: with modified independence;with adaptive equipment;sit to/from stand Pt Will Perform Lower Body Dressing: with modified independence;with adaptive equipment;sit to/from stand Pt Will Transfer to Toilet: with modified independence;ambulating;bedside commode Pt Will Perform Toileting - Clothing Manipulation and hygiene: with modified independence;with adaptive equipment;sit to/from stand Pt Will Perform Tub/Shower Transfer: Shower transfer;ambulating;rolling walker;with supervision;shower seat  OT Frequency: Min 2X/week   Barriers to D/C:            Co-evaluation PT/OT/SLP Co-Evaluation/Treatment: Yes Reason for Co-Treatment: To address functional/ADL transfers;For patient/therapist safety   OT goals addressed during session: ADL's and self-care      End of Session Equipment Utilized During Treatment: Gait belt;Rolling walker Nurse Communication: Other (comment);Mobility status (Pt's HR (142-145) with activity and BP (105/79))  Activity Tolerance: Patient tolerated treatment well Patient left: in chair;with call bell/phone within reach;with family/visitor present   Time: 7076-1518 OT Time Calculation (min): 28 min Charges:  OT General Charges $OT Visit: 1 Procedure OT Evaluation $OT Eval Moderate Complexity: 1 Procedure G-CodesNorman Herrlich, OTR/L [phone] 03/17/2016, 1:38 PM

## 2016-03-17 NOTE — Op Note (Signed)
NAME:  Christian King, Christian King            ACCOUNT NO.:  [id number]  MEDICAL RECORD NO.:  [id number]  LOCATION:  [id number]                        FACILITY:  [facility]  PHYSICIAN:  Dyke BrackettW. D. Sady Monaco, M.D.    DATE OF BIRTH:  April 04, 1970  DATE OF PROCEDURE:  03/16/2016 DATE OF DISCHARGE:                              OPERATIVE REPORT   PREOPERATIVE DIAGNOSIS:  Avascular necrosis, left hip.  POSTOPERATIVE DIAGNOSIS:  Avascular necrosis, left hip.  OPERATION:  Left total hip replacement (AML size 15 small stature stem, +5 mm neck length with ceramic hip ball 36 mm, 52 mm Pinnacle cup with Gription with polyethylene +10 degree lip liner.  SURGEON:  Dyke BrackettW. D. Brae Schaafsma, M.D.  ASSISTANVincent Peyer:  [place], PA.  ANESTHESIA:  Spinal.  BLOOD LOSS:  Approximately 300.  PROCEDURE DESCRIPTION:  Spinal anesthetic with lateral position posterior approach to the hip made.  We split the iliotibial band, gluteus maximus fascia, did a T-capsulotomy in the hips, split the short external rotators, did a T-capsulotomy in the hip.  Had minimal deformity, but he had whole head AVN on MRI with significant pain.  We progressively reamed and rasped to accept the 50 mm small-statured trial.  Attention was next directed to the acetabulum.  Acetabular retractors were placed posterior-inferior, anterior-inferior with Wing retractors superior and posterior.  Acetabulum was progressively reamed for a 1 mm under reaming to accept a 52 mm cup.  I tried a 50 cup, which on trial, which bottomed out nicely and then placed the Pinnacle cup with Gription with approximately 40-45 degrees of abduction with 10-15 degrees of anteversion.  Poly was placed in the cup and then we trialed off the femoral rasp, eventually settling on a +5 mm neck length.  Final femur was placed as well with a ceramic hip ball.  All parameters were deemed to be acceptable with stability, specifically restoration of leg lengths, no tendency to dislocate except at extreme  flexion past 90, full abduction and external rotation, past 70-80 degrees.  A copious irrigation was used throughout the case prior to inserting and after inserting the final components.  Closure was affected on the capsule with #1 Ethibond, running on the fascia, and 2-0 Vicryl and skin clips on the skin.  Lightly compressive sterile dressing, knee immobilizer applied, taken to recovery room in stable condition.     Dyke BrackettW. D. Kaeleigh Westendorf, M.D.     WDC/MEDQ  D:  03/16/2016  T:  03/17/2016  Job:  [id number]

## 2016-03-17 NOTE — Evaluation (Signed)
Physical Therapy Evaluation Patient Details Name: Christian King MRN: [id number] DOB: Nov 03, 1970 Today's Date: 03/17/2016   History of Present Illness  Pt is a 46 y.o. male s/p L THA posterior approach. PMH significant for but not limited to insomnia, hemochromatosis, alcohol abuse, B12 deficiency, brochnitis, gout, HLD, HTN, hypothyroisism,  and ventricular tachycardia.  Clinical Impression  Pt admitted with above diagnosis. Pt currently with functional limitations due to the deficits listed below (see PT Problem List). Pt was able to ambulate in halls with good safety overall with RW.  Pt needed cues for sequencing steps and RW.  Pt did have incr HR to 145 therefore decided to wait on practicing steps as pain meds were due.  Will follow acutely.   Pt will benefit from skilled PT to increase their independence and safety with mobility to allow discharge to the venue listed below.    Follow Up Recommendations Home health PT;Supervision/Assistance - 24 hour    Equipment Recommendations  Rolling walker with 5" wheels    Recommendations for Other Services       Precautions / Restrictions Precautions Precautions: Fall;Posterior Hip Precaution Booklet Issued: Yes (comment) Precaution Comments: Reviewed handout with pt and wife. Restrictions Weight Bearing Restrictions: Yes LLE Weight Bearing: Weight bearing as tolerated      Mobility  Bed Mobility Overal bed mobility: Needs Assistance Bed Mobility: Supine to Sit     Supine to sit: Supervision;HOB elevated     General bed mobility comments: Pt took incr time but able to come to EOB with no assist by PT/OT  Transfers Overall transfer level: Needs assistance Equipment used: Rolling walker (2 wheeled) Transfers: Sit to/from Stand Sit to Stand: Min assist         General transfer comment: Needed cues for hand placement and steadying assist.   Ambulation/Gait Ambulation/Gait assistance: Min guard;+2  safety/equipment Ambulation Distance (Feet): 120 Feet Assistive device: Rolling walker (2 wheeled) Gait Pattern/deviations: Step-to pattern;Decreased step length - left;Decreased stance time - left;Decreased weight shift to left;Antalgic   Gait velocity interpretation: Below normal speed for age/gender General Gait Details: Pt ambulated into bathroom and used bathroom in standing before walking in halls.  Wife guarded while he urinated.  Pt needed cues for sequencing steps and RW.  Overall good safety with RW except occasional cues to stay close to RW.   Stairs Stairs:  (Was going to attempt steps however pt HR 145 and pt dizzy)          Wheelchair Mobility    Modified Rankin (Stroke Patients Only)       Balance Overall balance assessment: Needs assistance Sitting-balance support: No upper extremity supported;Feet supported Sitting balance-Leahy Scale: Good     Standing balance support: Bilateral upper extremity supported;No upper extremity supported;During functional activity Standing balance-Leahy Scale: Fair Standing balance comment: Able to statically stand at sink to wash hands with no UE support but reliant on RW for B UE support during functional mobility.                             Pertinent Vitals/Pain Pain Assessment: 0-10 Pain Score: 6  Pain Location: L hip Pain Descriptors / Indicators: Aching;Grimacing;Operative site guarding;Sore Pain Intervention(s): Limited activity within patient's tolerance;Monitored during session;Premedicated before session;Repositioned  HR to 145 bpm with activity; 105/79 with dizziness with activity.      Home Living Family/patient expects to be discharged to:: Private residence Living Arrangements: Spouse/significant other Available Help at  Discharge: Family;Available 24 hours/day (Significant other for 1 week) Type of Home: House Home Access: Stairs to enter Entrance Stairs-Rails: Right;Left (cannot reach  both) Entrance Stairs-Number of Steps: 5 Home Layout: Two level;Able to live on main level with bedroom/bathroom Home Equipment: Shower seat      Prior Function Level of Independence: Independent with assistive device(s)         Comments: Using cane      Hand Dominance   Dominant Hand: Right    Extremity/Trunk Assessment   Upper Extremity Assessment Upper Extremity Assessment: Defer to OT evaluation    Lower Extremity Assessment Lower Extremity Assessment: LLE deficits/detail LLE Deficits / Details: Decreased strength and ROM as expected post-operatively.       Communication   Communication: No difficulties  Cognition Arousal/Alertness: Awake/alert Behavior During Therapy: WFL for tasks assessed/performed Overall Cognitive Status: Within Functional Limits for tasks assessed                      General Comments      Exercises General Exercises - Lower Extremity Ankle Circles/Pumps: AROM;Both;5 reps;Supine Quad Sets: AROM;Both;10 reps;Supine Heel Slides: AAROM;Left;5 reps;Seated   Assessment/Plan    PT Assessment Patient needs continued PT services  PT Problem List Decreased strength;Decreased range of motion;Decreased balance;Decreased activity tolerance;Decreased mobility;Decreased knowledge of use of DME;Decreased knowledge of precautions;Decreased safety awareness;Pain          PT Treatment Interventions DME instruction;Gait training;Functional mobility training;Stair training;Therapeutic activities;Therapeutic exercise;Balance training;Patient/family education    PT Goals (Current goals can be found in the Care Plan section)  Acute Rehab PT Goals Patient Stated Goal: to go home PT Goal Formulation: With patient Time For Goal Achievement: 03/24/16 Potential to Achieve Goals: Good    Frequency 7X/week   Barriers to discharge        Co-evaluation PT/OT/SLP Co-Evaluation/Treatment: Yes Reason for Co-Treatment: For patient/therapist  safety PT goals addressed during session: Mobility/safety with mobility OT goals addressed during session: ADL's and self-care       End of Session Equipment Utilized During Treatment: Gait belt Activity Tolerance: Patient limited by fatigue;Patient limited by pain Patient left: in chair;with call bell/phone within reach;with family/visitor present Nurse Communication: Mobility status (notified of HR increase with activity)         Time: 1478-29561156-1215 PT Time Calculation (min) (ACUTE ONLY): 19 min   Charges:   PT Evaluation $PT Eval Moderate Complexity: 1 Procedure     PT G Codes:        Imari Sivertsen F Woodson Macha 03/17/2016, 2:40 PM [facility] Petrea,PT Acute Rehabilitation [phone])

## 2016-03-17 NOTE — Progress Notes (Addendum)
Patient ID: Christian King, male   DOB: 1970/10/15, 46 y.o.   MRN: [id number]     Subjective:  Patient reports pain as mild.  Patient itching but states that he is doing well.  Denies any CP or SOB  Objective:   VITALS:   Vitals:   03/16/16 1400 03/16/16 2034 03/16/16 2333 03/17/16 0650  BP: 114/82 132/76 128/81 119/80  Pulse: 74 87 98 (!) 104  Resp:  16    Temp: 98.6 F (37 C) 99.6 F (37.6 C) 99.7 F (37.6 C) 99.4 F (37.4 C)  TempSrc:  Oral Oral Oral  SpO2: 100% 99%  96%  Weight:      Height:        ABD soft Sensation intact distally Dorsiflexion/Plantar flexion intact Incision: dressing C/D/I and no drainage Good foot and ankle motion  Lab Results  Component Value Date   WBC 9.4 03/17/2016   HGB 12.1 (L) 03/17/2016   HCT 35.9 (L) 03/17/2016   MCV 106.2 (H) 03/17/2016   PLT 167 03/17/2016   BMET    Component Value Date/Time   NA 132 (L) 03/17/2016 0759   NA 146 (H) 01/06/2016 1630   K 3.0 (L) 03/17/2016 0759   CL 96 (L) 03/17/2016 0759   CO2 23 03/17/2016 0759   GLUCOSE 85 03/17/2016 0759   BUN <5 (L) 03/17/2016 0759   BUN 7 01/06/2016 1630   CREATININE 0.84 03/17/2016 0759   CALCIUM 8.3 (L) 03/17/2016 0759   GFRNONAA >60 03/17/2016 0759   GFRAA >60 03/17/2016 0759     Assessment/Plan: 1 Day Post-Op   Active Problems:   Degenerative joint disease of left hip   Advance diet Up with therapy Plan for discharge tomorrow WBAT Dry dressing PRN   Haskel KhanDOUGLAS PARRY, BRANDON 03/17/2016, 12:06 PM  Discussed and agree with above.  Mild hypokalemia, will replace with po, given history of prolonged QT interval.  Avoid benadryl, although patient is requesting it, but increases QT interval and this is risky for him.    Teryl LucyJoshua Taysom Glymph, MD Cell [phone]

## 2016-03-17 NOTE — Progress Notes (Signed)
Physical Therapy Treatment Patient Details Name: Christian King MRN: [id number] DOB: 09/05/1970 Today's Date: 03/17/2016    History of Present Illness Pt is a 46 y.o. male s/p L THA posterior approach. PMH significant for but not limited to insomnia, hemochromatosis, alcohol abuse, B12 deficiency, brochnitis, gout, HLD, HTN, hypothyroisism,  and ventricular tachycardia.    PT Comments    Mobility improving, but EHR still of some concern at 129-132 with minimal exertion.  Educated on basic THR exercises and Transfer/RW safety ongoing.  Follow Up Recommendations  Home health PT;Supervision/Assistance - 24 hour     Equipment Recommendations  Rolling walker with 5" wheels    Recommendations for Other Services       Precautions / Restrictions Precautions Precautions: Fall;Posterior Hip Precaution Booklet Issued: Yes (comment) Precaution Comments: Reviewed handout with pt and wife. Restrictions Weight Bearing Restrictions: Yes LLE Weight Bearing: Weight bearing as tolerated    Mobility  Bed Mobility Overal bed mobility: Needs Assistance Bed Mobility: Supine to Sit     Supine to sit: Supervision;HOB elevated     General bed mobility comments: Pt took incr time but able to come to EOB with no assist by PT/OT  Transfers Overall transfer level: Needs assistance Equipment used: Rolling walker (2 wheeled) Transfers: Sit to/from Stand Sit to Stand: Min guard         General transfer comment: cues for transfer safety  Ambulation/Gait Ambulation/Gait assistance: Min guard Ambulation Distance (Feet): 100 Feet Assistive device: Rolling walker (2 wheeled) Gait Pattern/deviations: Step-to pattern   Gait velocity interpretation: Below normal speed for age/gender General Gait Details: cues for sequencing/holding pt back.  Heavy use of the RW  EHR up to 129 to 132 bpm.  SpO2 95-98%   Stairs Stairs: Yes   Stair Management: One rail Left;Step to pattern;Forwards Number  of Stairs: 4 General stair comments: cues for sequencing, holding the RW  Wheelchair Mobility    Modified Rankin (Stroke Patients Only)       Balance Overall balance assessment: No apparent balance deficits (not formally assessed) Sitting-balance support: No upper extremity supported;Feet supported Sitting balance-Leahy Scale: Good     Standing balance support: Bilateral upper extremity supported;No upper extremity supported;During functional activity Standing balance-Leahy Scale: Fair Standing balance comment: Able to statically stand at sink to wash hands with no UE support but reliant on RW for B UE support during functional mobility.                    Cognition Arousal/Alertness: Awake/alert Behavior During Therapy: WFL for tasks assessed/performed Overall Cognitive Status: Within Functional Limits for tasks assessed                      Exercises Total Joint Exercises Ankle Circles/Pumps: AROM;20 reps;Seated Quad Sets: AROM;Both;10 reps;Seated Short Arc Quad: AROM;Left;5 reps;Seated Heel Slides: AROM;Strengthening;Left;10 reps;Seated (graded resistance) Long Arc Quad: AROM;Left;10 reps;Seated General Exercises - Lower Extremity Ankle Circles/Pumps: AROM;Both;5 reps;Supine Quad Sets: AROM;Both;10 reps;Supine Heel Slides: AAROM;Left;5 reps;Seated    General Comments        Pertinent Vitals/Pain Pain Assessment: Faces Pain Score: 6  Faces Pain Scale: Hurts little more Pain Location: L hip Pain Descriptors / Indicators: Sore;Grimacing Pain Intervention(s): Monitored during session    Home Living Family/patient expects to be discharged to:: Private residence Living Arrangements: Spouse/significant other Available Help at Discharge: Family;Available 24 hours/day (Significant other for 1 week) Type of Home: House Home Access: Stairs to enter Entrance Stairs-Rails: Right;Left (cannot reach both)  Home Layout: Two level;Able to live on main level  with bedroom/bathroom Home Equipment: Shower seat      Prior Function Level of Independence: Independent with assistive device(s)      Comments: Using cane    PT Goals (current goals can now be found in the care plan section) Acute Rehab PT Goals Patient Stated Goal: to go home PT Goal Formulation: With patient Time For Goal Achievement: 03/24/16 Potential to Achieve Goals: Good Progress towards PT goals: Progressing toward goals    Frequency    7X/week      PT Plan Current plan remains appropriate    Co-evaluation PT/OT/SLP Co-Evaluation/Treatment: Yes Reason for Co-Treatment: For patient/therapist safety PT goals addressed during session: Mobility/safety with mobility OT goals addressed during session: ADL's and self-care     End of Session Equipment Utilized During Treatment: Gait belt Activity Tolerance: Patient tolerated treatment well Patient left: in chair;with call bell/phone within reach;with family/visitor present     Time: 1429-1500 PT Time Calculation (min) (ACUTE ONLY): 31 min  Charges:  $Gait Training: 8-22 mins $Therapeutic Exercise: 8-22 mins                    G CodesEliseo Gum Therisa Mennella 03/17/2016, 3:09 PM 03/17/2016  [place] Bing, PT [phone]  (pager)

## 2016-03-17 NOTE — Discharge Instructions (Signed)
INSTRUCTIONS AFTER JOINT REPLACEMENT  ° °o Remove items at home which could result in a fall. This includes throw rugs or furniture in walking pathways °o ICE to the affected joint every three hours while awake for 30 minutes at a time, for at least the first 3-5 days, and then as needed for pain and swelling.  Continue to use ice for pain and swelling. You may notice swelling that will progress down to the foot and ankle.  This is normal after surgery.  Elevate your leg when you are not up walking on it.   °o Continue to use the breathing machine you got in the hospital (incentive spirometer) which will help keep your temperature down.  It is common for your temperature to cycle up and down following surgery, especially at night when you are not up moving around and exerting yourself.  The breathing machine keeps your lungs expanded and your temperature down. ° ° °DIET:  As you were doing prior to hospitalization, we recommend a well-balanced diet. ° °DRESSING / WOUND CARE / SHOWERING ° °You may change your dressing 3-5 days after surgery.  Then change the dressing every day with sterile gauze.  Please use good hand washing techniques before changing the dressing.  Do not use any lotions or creams on the incision until instructed by your surgeon. ° °ACTIVITY ° °o Increase activity slowly as tolerated, but follow the weight bearing instructions below.   °o No driving for 6 weeks or until further direction given by your physician.  You cannot drive while taking narcotics.  °o No lifting or carrying greater than 10 lbs. until further directed by your surgeon. °o Avoid periods of inactivity such as sitting longer than an hour when not asleep. This helps prevent blood clots.  °o You may return to work once you are authorized by your doctor.  ° ° ° °WEIGHT BEARING  ° °Weight bearing as tolerated with assist device (walker, cane, etc) as directed, use it as long as suggested by your surgeon or therapist, typically at  least 4-6 weeks. ° ° °EXERCISES ° °Results after joint replacement surgery are often greatly improved when you follow the exercise, range of motion and muscle strengthening exercises prescribed by your doctor. Safety measures are also important to protect the joint from further injury. Any time any of these exercises cause you to have increased pain or swelling, decrease what you are doing until you are comfortable again and then slowly increase them. If you have problems or questions, call your caregiver or physical therapist for advice.  ° °Rehabilitation is important following a joint replacement. After just a few days of immobilization, the muscles of the leg can become weakened and shrink (atrophy).  These exercises are designed to build up the tone and strength of the thigh and leg muscles and to improve motion. Often times heat used for twenty to thirty minutes before working out will loosen up your tissues and help with improving the range of motion but do not use heat for the first two weeks following surgery (sometimes heat can increase post-operative swelling).  ° °These exercises can be done on a training (exercise) mat, on the floor, on a table or on a bed. Use whatever works the best and is most comfortable for you.    Use music or television while you are exercising so that the exercises are a pleasant break in your day. This will make your life better with the exercises acting as a break   in your routine that you can look forward to.   Perform all exercises about fifteen times, three times per day or as directed.  You should exercise both the operative leg and the other leg as well. ° °Exercises include: °  °• Quad Sets - Tighten up the muscle on the front of the thigh (Quad) and hold for 5-10 seconds.   °• Straight Leg Raises - With your knee straight (if you were given a brace, keep it on), lift the leg to 60 degrees, hold for 3 seconds, and slowly lower the leg.  Perform this exercise against  resistance later as your leg gets stronger.  °• Leg Slides: Lying on your back, slowly slide your foot toward your buttocks, bending your knee up off the floor (only go as far as is comfortable). Then slowly slide your foot back down until your leg is flat on the floor again.  °• Angel Wings: Lying on your back spread your legs to the side as far apart as you can without causing discomfort.  °• Hamstring Strength:  Lying on your back, push your heel against the floor with your leg straight by tightening up the muscles of your buttocks.  Repeat, but this time bend your knee to a comfortable angle, and push your heel against the floor.  You may put a pillow under the heel to make it more comfortable if necessary.  ° °A rehabilitation program following joint replacement surgery can speed recovery and prevent re-injury in the future due to weakened muscles. Contact your doctor or a physical therapist for more information on knee rehabilitation.  ° ° °CONSTIPATION ° °Constipation is defined medically as fewer than three stools per week and severe constipation as less than one stool per week.  Even if you have a regular bowel pattern at home, your normal regimen is likely to be disrupted due to multiple reasons following surgery.  Combination of anesthesia, postoperative narcotics, change in appetite and fluid intake all can affect your bowels.  ° °YOU MUST use at least one of the following options; they are listed in order of increasing strength to get the job done.  They are all available over the counter, and you may need to use some, POSSIBLY even all of these options:   ° °Drink plenty of fluids (prune juice may be helpful) and high fiber foods °Colace 100 mg by mouth twice a day  °Senokot for constipation as directed and as needed Dulcolax (bisacodyl), take with full glass of water  °Miralax (polyethylene glycol) once or twice a day as needed. ° °If you have tried all these things and are unable to have a bowel  movement in the first 3-4 days after surgery call either your surgeon or your primary doctor.   ° °If you experience loose stools or diarrhea, hold the medications until you stool forms back up.  If your symptoms do not get better within 1 week or if they get worse, check with your doctor.  If you experience "the worst abdominal pain ever" or develop nausea or vomiting, please contact the office immediately for further recommendations for treatment. ° ° °ITCHING:  If you experience itching with your medications, try taking only a single pain pill, or even half a pain pill at a time.  You can also use Benadryl over the counter for itching or also to help with sleep.  ° °TED HOSE STOCKINGS:  Use stockings on both legs until for at least 2 weeks or as   directed by physician office. They may be removed at night for sleeping. ° °MEDICATIONS:  See your medication summary on the “After Visit Summary” that nursing will review with you.  You may have some home medications which will be placed on hold until you complete the course of blood thinner medication.  It is important for you to complete the blood thinner medication as prescribed. ° °PRECAUTIONS:  If you experience chest pain or shortness of breath - call 911 immediately for transfer to the hospital emergency department.  ° °If you develop a fever greater that 101 F, purulent drainage from wound, increased redness or drainage from wound, foul odor from the wound/dressing, or calf pain - CONTACT YOUR SURGEON.   °                                                °FOLLOW-UP APPOINTMENTS:  If you do not already have a post-op appointment, please call the office for an appointment to be seen by your surgeon.  Guidelines for how soon to be seen are listed in your “After Visit Summary”, but are typically between 1-4 weeks after surgery. ° °OTHER INSTRUCTIONS:  ° °Knee Replacement:  Do not place pillow under knee, focus on keeping the knee straight while resting. CPM  instructions: 0-90 degrees, 2 hours in the morning, 2 hours in the afternoon, and 2 hours in the evening. Place foam block, curve side up under heel at all times except when in CPM or when walking.  DO NOT modify, tear, cut, or change the foam block in any way. ° °MAKE SURE YOU:  °• Understand these instructions.  °• Get help right away if you are not doing well or get worse.  ° ° °Thank you for letting us be a part of your medical care team.  It is a privilege we respect greatly.  We hope these instructions will help you stay on track for a fast and full recovery!  ° ° ° °Information on my medicine - ELIQUIS® (apixaban) ° °This medication education was reviewed with me or my healthcare representative as part of my discharge preparation.  The pharmacist that spoke with me during my hospital stay was:  Davita Sublett Dien, RPH ° °Why was Eliquis® prescribed for you? °Eliquis® was prescribed for you to reduce the risk of blood clots forming after orthopedic surgery.   ° °What do You need to know about Eliquis®? °Take your Eliquis® TWICE DAILY - one tablet in the morning and one tablet in the evening with or without food.  It would be best to take the dose about the same time each day. ° °If you have difficulty swallowing the tablet whole please discuss with your pharmacist how to take the medication safely. ° °Take Eliquis® exactly as prescribed by your doctor and DO NOT stop taking Eliquis® without talking to the doctor who prescribed the medication.  Stopping without other medication to take the place of Eliquis® may increase your risk of developing a clot. ° °After discharge, you should have regular check-up appointments with your healthcare provider that is prescribing your Eliquis®. ° °What do you do if you miss a dose? °If a dose of ELIQUIS® is not taken at the scheduled time, take it as soon as possible on the same day and twice-daily administration should be resumed.  The dose should not   be doubled to make up for  a missed dose.  Do not take more than one tablet of ELIQUIS at the same time. ° °Important Safety Information °A possible side effect of Eliquis® is bleeding. You should call your healthcare provider right away if you experience any of the following: °? Bleeding from an injury or your nose that does not stop. °? Unusual colored urine (red or dark brown) or unusual colored stools (red or black). °? Unusual bruising for unknown reasons. °? A serious fall or if you hit your head (even if there is no bleeding). ° °Some medicines may interact with Eliquis® and might increase your risk of bleeding or clotting while on Eliquis®. To help avoid this, consult your healthcare provider or pharmacist prior to using any new prescription or non-prescription medications, including herbals, vitamins, non-steroidal anti-inflammatory drugs (NSAIDs) and supplements. ° °This website has more information on Eliquis® (apixaban): http://www.eliquis.com/eliquis/home ° °

## 2016-03-18 LAB — CBC
HCT: 34.2 % — ABNORMAL LOW (ref 39.0–52.0)
HEMOGLOBIN: 11.7 g/dL — AB (ref 13.0–17.0)
MCH: 36.4 pg — AB (ref 26.0–34.0)
MCHC: 34.2 g/dL (ref 30.0–36.0)
MCV: 106.5 fL — AB (ref 78.0–100.0)
Platelets: 156 10*3/uL (ref 150–400)
RBC: 3.21 MIL/uL — ABNORMAL LOW (ref 4.22–5.81)
RDW: 14.3 % (ref 11.5–15.5)
WBC: 9.7 10*3/uL (ref 4.0–10.5)

## 2016-03-18 NOTE — Progress Notes (Signed)
Occupational Therapy Treatment Patient Details Name: Christian King MRN: [id number] DOB: Jul 13, 1970 Today's Date: 03/18/2016    History of present illness Pt is a 46 y.o. male s/p L THA posterior approach. PMH significant for but not limited to insomnia, hemochromatosis, alcohol abuse, B12 deficiency, brochnitis, gout, HLD, HTN, hypothyroisism,  and ventricular tachycardia.   OT comments  Session somewhat limited today secondary to pt.s complaints of increased pain in L hip and concerns regarding his HR.  Focus of session was on proper use of A/E.  Able to return demo of use and is interested in purchase for home use.  Next session hopes for increased functional mobility to address toileting and tub transfers.    Follow Up Recommendations  No OT follow up;Supervision/Assistance - 24 hour    Equipment Recommendations  3 in 1 bedside commode;Other (comment)    Recommendations for Other Services      Precautions / Restrictions Precautions Precautions: Fall;Posterior Hip Restrictions LLE Weight Bearing: Weight bearing as tolerated       Mobility Bed Mobility               General bed mobility comments: seated in recliner at beginning and end of session, declined any mobility  Transfers                 General transfer comment: declined all attempts at mobility seated in recliner for session    Balance                                   ADL Overall ADL's : Needs assistance/impaired           Upper Body Bathing Details (indicate cue type and reason): fiance reports pt. completed sponge bath with her in b.room prior to my arrival   Lower Body Bathing Details (indicate cue type and reason): fiance reports pt. completed sponge bath with her in b.room prior to my arrival     Lower Body Dressing: Set up;Sitting/lateral leans;Cueing for sequencing;With adaptive equipment Lower Body Dressing Details (indicate cue type and reason): provided A/E and  had pt. return demo with each item.  reviewed the kit and educated on purchase availability in Media planner Details (indicate cue type and reason): pt. declined stating he had just returned from the b.room       Tub/Shower Transfer Details (indicate cue type and reason): pt. declined stating pain was a factor and he has concerns about his HR   General ADL Comments: A/E was focus of this session with pt. able to return demo for safe use.  fiance states she is returning to work in a week so pt. "will have to be able to do this stuff on his own"      Vision                     Perception     Praxis      Cognition   Behavior During Therapy: Agitated;Anxious Overall Cognitive Status: Within Functional Limits for tasks assessed                  General Comments: interupting a lot and very easily frustrated    Extremity/Trunk Assessment               Exercises     Shoulder Instructions       General Comments  pt. With appeared  high anxiety and agitation throughout session.  When entering the room pt. States "you aren't tyring to kill me are you, you know about my HR right".  Explained I had reviewed his chart and would be cautious.  He states "well I can tell you, you ain't getting much out of me today I am in so much pain".  It was agreed upon since pt. Was declining any functional mobility that we would do a session focusing on A/E so he could remain seated.  Pt. Remained agitated and interrupted therapist asst. Throughout entire session and was visibly frustrated with any attempts at instruction.  He and his fiance were also having arguments the entire time making therapeutic inst. Difficult.      Pertinent Vitals/ Pain       Pain Assessment:  (did not rate but states it is un bearable) Pain Location: L hip Pain Descriptors / Indicators: Aching;Sore Pain Intervention(s): Limited activity within patient's tolerance;Monitored during  session;Repositioned;Ice applied  Home Living                                          Prior Functioning/Environment              Frequency  Min 2X/week        Progress Toward Goals  OT Goals(current goals can now be found in the care plan section)  Progress towards OT goals: Progressing toward goals     Plan Discharge plan remains appropriate    Co-evaluation                 End of Session Equipment Utilized During Treatment: Other (comment) (A/E)   Activity Tolerance     Patient Left     Nurse Communication          Time: 1001-1017 OT Time Calculation (min): 16 min  Charges: OT General Charges $OT Visit: 1 Procedure OT Treatments $Self Care/Home Management : 8-22 mins  Janice Coffin, COTA/L 03/18/2016, 10:26 AM

## 2016-03-18 NOTE — Progress Notes (Signed)
Physical Therapy Treatment Patient Details Name: Christian NuttingMichael L King MRN: [id number] DOB: 1970-05-06 Today's Date: 03/18/2016    History of Present Illness Pt is a 46 y.o. male s/p L THA posterior approach. PMH significant for but not limited to insomnia, hemochromatosis, alcohol abuse, B12 deficiency, brochnitis, gout, HLD, HTN, hypothyroisism,  and ventricular tachycardia.    PT Comments    Pt is making steady progress toward goals with less pain today and increased overall activity performed. Acute PT to continue during pt's hospital stay.  Follow Up Recommendations  Home health PT;Supervision/Assistance - 24 hour     Equipment Recommendations  Rolling walker with 5" wheels    Precautions / Restrictions Precautions Precautions: Fall;Posterior Hip Precaution Comments: reviewed all 3 posterior hip precautions with pt/fiance as pt able to recall 2/3 without cues/handout Restrictions LLE Weight Bearing: Weight bearing as tolerated    Mobility  Bed Mobility               General bed mobility comments: not addressed: pt in recliner before and after session  Transfers Overall transfer level: Needs assistance Equipment used: Rolling walker (2 wheeled) Transfers: Sit to/from Stand Sit to Stand: Min guard         General transfer comment: pt demo'd safe technique with good hand and left LE placement with both standing up and sitting down  Ambulation/Gait Ambulation/Gait assistance: Min guard;Supervision Ambulation Distance (Feet): 200 Feet Assistive device: Rolling walker (2 wheeled) Gait Pattern/deviations: Step-to pattern;Step-through pattern;Antalgic   Gait velocity interpretation: Below normal speed for age/gender General Gait Details: pt progressed from step to pattern to reciprocal/step through gait pattern with cues, pt also intially demo'd excessive UE weight bearing on RW. this too improved with cues. Pt's HR max with gait was 118 bpm with mild dizziness  reported that maintained (did not increase or decreased).                                     Cognition Arousal/Alertness: Awake/alert Behavior During Therapy: WFL for tasks assessed/performed Overall Cognitive Status: Within Functional Limits for tasks assessed                 General Comments: more pleasant with PT session than with OT earlier.    Exercises Total Joint Exercises Ankle Circles/Pumps: AROM;Both;10 reps;Seated;Limitations Ankle Circles/Pumps Limitations: seated in recliner wtih feet up, cues for going through full range of motion at ankle Quad Sets: AROM;Strengthening;Both;10 reps;Seated;Limitations Quad Sets Limitations: in recliner with feet up: 3 sec holds with each rep Short Arc Quad: AROM;Strengthening;Left;10 reps;Seated;Limitations Short Arc Quad Limitations: in recliner with feet up: 3 sec holds with cues for slow, controlled descent when lowering foot back down Heel Slides: AAROM;Strengthening;Left;10 reps;Seated;Limitations Heel Slides Limitations: in recliner with feet up: min AA to achieve full range within precautions Long Arc Quad: AROM;Strengthening;Left;10 reps;Seated;Limitations Long [employer] Quad Limitations: pt unable to achieve full knee extension with reps     Pertinent Vitals/Pain Pain Assessment: 0-10 Pain Score: 4  Pain Location: L hip Pain Descriptors / Indicators: Aching;Sore;Operative site guarding Pain Intervention(s): Limited activity within patient's tolerance;Repositioned;Premedicated before session;Monitored during session     PT Goals (current goals can now be found in the care plan section) Acute Rehab PT Goals Patient Stated Goal: to go home PT Goal Formulation: With patient Time For Goal Achievement: 03/24/16 Potential to Achieve Goals: Good Progress towards PT goals: Progressing toward goals    Frequency  7X/week      PT Plan Current plan remains appropriate    End of Session Equipment Utilized During  Treatment: Gait belt Activity Tolerance: Patient tolerated treatment well;No increased pain Patient left: in chair;with call bell/phone within reach;with nursing/sitter in room     Time: 1105-1139 PT Time Calculation (min) (ACUTE ONLY): 34 min  Charges:  $Gait Training: 8-22 mins $Therapeutic Exercise: 8-22 mins           Sallyanne Kuster 03/18/2016, 11:54 AM   Sallyanne Kuster, PTA, CLT Acute Rehab Services [phone] 03/18/16, 11:55 AM

## 2016-03-18 NOTE — Progress Notes (Signed)
Patient ID: Christian NuttingMichael L King, male   DOB: [birth year], 46 y.o.   MRN: [id number]     Subjective:  Patient reports pain as mild.  Patient reports good improvement  from yesterday.  Denies any CP or SOB and his itching improved  Objective:   VITALS:   Vitals:   03/17/16 1500 03/17/16 1658 03/17/16 2028 03/18/16 0453  BP: 108/61  125/74 118/74  Pulse: 96  (!) 101 98  Resp:      Temp: 98.6 F (37 C) 98.2 F (36.8 C) 99.2 F (37.3 C) 99.2 F (37.3 C)  TempSrc: Oral  Oral Oral  SpO2: 94%  98% 95%  Weight:      Height:        ABD soft Sensation intact distally Dorsiflexion/Plantar flexion intact Incision: dressing C/D/I and no drainage Dressing removed wound good and dry dressing applied  Lab Results  Component Value Date   WBC 9.7 03/18/2016   HGB 11.7 (L) 03/18/2016   HCT 34.2 (L) 03/18/2016   MCV 106.5 (H) 03/18/2016   PLT 156 03/18/2016   BMET    Component Value Date/Time   NA 132 (L) 03/17/2016 0759   NA 146 (H) 01/06/2016 1630   K 3.0 (L) 03/17/2016 0759   CL 96 (L) 03/17/2016 0759   CO2 23 03/17/2016 0759   GLUCOSE 85 03/17/2016 0759   BUN <5 (L) 03/17/2016 0759   BUN 7 01/06/2016 1630   CREATININE 0.84 03/17/2016 0759   CALCIUM 8.3 (L) 03/17/2016 0759   GFRNONAA >60 03/17/2016 0759   GFRAA >60 03/17/2016 0759     Assessment/Plan: 2 Days Post-Op   Active Problems:   QT prolongation   Degenerative joint disease of left hip   Hypokalemia   Advance diet Up with therapy Discharge home with home health WBAT Dry dressing PRN Follow up with Dr Christian Mateaffery   Torrie MayersUGLAS Chavy [facility] 03/18/2016, 12:00 PM   Teryl LucyJoshua Landau, MD Cell [phone]

## 2016-03-19 ENCOUNTER — Encounter (HOSPITAL_COMMUNITY): Payer: Self-pay | Admitting: Orthopedic Surgery

## 2016-03-19 NOTE — Care Management (Signed)
Patient was discharged home on 03/18/16. He was preoperatively setup with [facility]. Case manager has spoken with Kipp BroodBrent with [employer] concerning getting DME to patient, also contacted Ayesha RumpfMary Yonjof, [facility] Liaison to be confirm patient is scheduled to receive HHPT. CM also contacted Tresa EndoKelly at Dr. Valentina Guaffery's office to discuss situation.  Vance PeperSusan Djeneba Barsch, RN BSN Case Manager

## 2016-04-10 NOTE — Discharge Summary (Signed)
PATIENT ID: Christian King        MRN:  [id number]          DOB/AGE: 48-Nov-1972 / 48 y.o.    DISCHARGE SUMMARY  ADMISSION DATE:    03/16/2016 DISCHARGE DATE:   04/10/2016   ADMISSION DIAGNOSIS: OA LEFT HIP    DISCHARGE DIAGNOSIS:  OA LEFT HIP    ADDITIONAL DIAGNOSIS: Active Problems:   QT prolongation   Degenerative joint disease of left hip   Hypokalemia  Past Medical History:  Diagnosis Date  . Alcohol abuse   . B12 deficiency   . Blood dyscrasia    hemachromatosis  . Bronchitis   . Degenerative joint disease (DJD) of hip   . GERD (gastroesophageal reflux disease)   . Gout   . History of kidney stones   . HLD (hyperlipidemia)   . HTN (hypertension)   . Hypothyroidism   . Ventricular tachycardia (HCC)    in setting of hypokalemia/ hypomagnesemia and ETOH  . Wears glasses     PROCEDURE: Procedure(s): TOTAL HIP ARTHROPLASTY Left on 03/16/2016  CONSULTS: pt/ot    HISTORY:  See H&P in chart  HOSPITAL COURSE:  Christian King is a 46 y.o. admitted on 03/16/2016 and found to have a diagnosis of OA LEFT HIP.  After appropriate laboratory studies were obtained  they were taken to the operating room on 03/16/2016 and underwent  Procedure(s): TOTAL HIP ARTHROPLASTY  Left.   They were given perioperative antibiotics:  Anti-infectives    Start     Dose/Rate Route Frequency Ordered Stop   03/16/16 1400  ceFAZolin (ANCEF) IVPB 1 g/50 mL premix     1 g 100 mL/hr over 30 Minutes Intravenous Every 6 hours 03/16/16 1028 03/16/16 2036   03/16/16 0529  ceFAZolin (ANCEF) IVPB 2g/100 mL premix     2 g 200 mL/hr over 30 Minutes Intravenous On call to O.R. 03/16/16 [id number] 03/16/16 0804    .  Tolerated the procedure well.   POD #1, allowed out of bed to a chair.  PT for ambulation and exercise program.  IV saline locked.  O2 discontionued.  POD #2, continued PT and ambulation.  The remainder of the hospital course was dedicated to ambulation and strengthening.   The patient  was discharged on 25 Days Post-Op in  Stable condition.  Blood products given:none  DIAGNOSTIC STUDIES: Recent vital signs: No data found.      Recent laboratory studies: No results for input(s): WBC, HGB, HCT, PLT in the last 168 hours. No results for input(s): NA, K, CL, CO2, BUN, CREATININE, GLUCOSE, CALCIUM in the last 168 hours. Lab Results  Component Value Date   INR 0.93 03/05/2016     Recent Radiographic Studies :  Dg Hip Port Unilat With Pelvis 1v Left  Result Date: 03/16/2016 CLINICAL DATA:  Status post hip replacement. EXAM: DG HIP (WITH OR WITHOUT PELVIS) 1V PORT LEFT COMPARISON:  Radiographs of March 08, 2006. FINDINGS: Status post interval placement of left total hip arthroplasty. The femoral and acetabular components are well situated. Surgical staples and other expected postsurgical changes are noted in the surrounding soft tissues. Right total hip arthroplasty is again noted and unchanged. IMPRESSION: Interval placement of left total hip arthroplasty. Electronically Signed   By: Lupita RaiderJames  Green Jr, M.D.   On: 03/16/2016 11:01    DISCHARGE INSTRUCTIONS:   DISCHARGE MEDICATIONS:   Allergies as of 03/18/2016      Reactions   Azithromycin    History  of QT prolongation   Tramadol Itching   Hctz [hydrochlorothiazide] Rash      Medication List    STOP taking these medications   amoxicillin 500 MG capsule Commonly known as:  AMOXIL   diphenhydrAMINE 25 MG tablet Commonly known as:  BENADRYL   ibuprofen 200 MG tablet Commonly known as:  ADVIL,MOTRIN   indomethacin 75 MG CR capsule Commonly known as:  INDOCIN SR   meloxicam 15 MG tablet Commonly known as:  MOBIC     TAKE these medications   apixaban 2.5 MG Tabs tablet Commonly known as:  ELIQUIS Take 1 tablet (2.5 mg total) by mouth 2 (two) times daily.   CLEAR EYES OP Place 1-2 drops into both eyes 3 (three) times daily as needed (for dry/itchy/irritated eyes).   fenofibrate 54 MG tablet Take 1  tablet by mouth  daily What changed:  how much to take  how to take this  when to take this  additional instructions   hydrOXYzine 50 MG capsule Commonly known as:  VISTARIL Take 1 capsule (50 mg total) by mouth at bedtime as needed (insomnia).   LORazepam 2 MG tablet Commonly known as:  ATIVAN TAKE ONE-HALF TABLET BY MOUTH ONCE DAILY AS NEEDED What changed:  how much to take  how to take this  when to take this  reasons to take this  additional instructions   magnesium oxide 400 MG tablet Commonly known as:  MAG-OX Take 1 tablet (400 mg total) by mouth daily. What changed:  when to take this   metoprolol succinate 50 MG 24 hr tablet Commonly known as:  TOPROL-XL TAKE 1 TABLET BY MOUTH  DAILY WITH/ OR IMMEDIATELY  FOLLOWING A MEAL What changed:  how much to take  how to take this  when to take this  additional instructions   oxyCODONE-acetaminophen 5-325 MG tablet Commonly known as:  ROXICET Take 1-2 tablets by mouth every 4 (four) hours as needed for severe pain.   potassium chloride 10 MEQ tablet Commonly known as:  K-DUR,KLOR-CON Take 1 tablet by mouth  every day What changed:  how much to take  how to take this  when to take this  additional instructions   sildenafil 20 MG tablet Commonly known as:  REVATIO 2-5 prn sex What changed:  how much to take  how to take this  when to take this  reasons to take this  additional instructions   simvastatin 40 MG tablet Commonly known as:  ZOCOR Take 1 tablet (40 mg total) by mouth daily. What changed:  when to take this   Vitamin D (Ergocalciferol) 50000 units Caps capsule Commonly known as:  DRISDOL Take 1 capsule by mouth  once every week What changed:  See the new instructions.       FOLLOW UP VISIT:   Follow-up Information    CAFFREY JR,W D, MD. Schedule an appointment as soon as possible for a visit in 2 weeks.   Specialty:  Orthopedic Surgery Contact information: [address] [place] [address] [phone]           DISPOSITION:   Home  CONDITION:  Stable   Margart Sickles, PA-C  04/10/2016 12:00 PM

## 2016-04-11 ENCOUNTER — Other Ambulatory Visit: Payer: Self-pay | Admitting: *Deleted

## 2016-04-11 DIAGNOSIS — I10 Essential (primary) hypertension: Secondary | ICD-10-CM

## 2016-04-12 MED ORDER — METOPROLOL SUCCINATE ER 50 MG PO TB24: ORAL_TABLET | ORAL | 1 refills | Status: DC

## 2016-04-19 ENCOUNTER — Other Ambulatory Visit: Payer: Self-pay | Admitting: Nurse Practitioner

## 2016-04-19 DIAGNOSIS — G4700 Insomnia, unspecified: Secondary | ICD-10-CM

## 2016-04-19 NOTE — Telephone Encounter (Signed)
Please call in loraepam with 0 refills

## 2016-04-19 NOTE — Telephone Encounter (Signed)
Pt aware.

## 2016-05-01 ENCOUNTER — Ambulatory Visit: Payer: Self-pay | Admitting: Physical Therapy

## 2016-05-03 ENCOUNTER — Encounter: Payer: Self-pay | Admitting: Physical Therapy

## 2016-05-15 ENCOUNTER — Ambulatory Visit: Payer: Self-pay | Admitting: Physical Therapy

## 2016-05-15 ENCOUNTER — Emergency Department (HOSPITAL_COMMUNITY): Payer: Self-pay

## 2016-05-15 ENCOUNTER — Ambulatory Visit (HOSPITAL_COMMUNITY): Admission: RE | Admit: 2016-05-15 | Payer: PRIVATE HEALTH INSURANCE | Source: Ambulatory Visit

## 2016-05-15 ENCOUNTER — Inpatient Hospital Stay (HOSPITAL_COMMUNITY)
Admission: EM | Admit: 2016-05-15 | Discharge: 2016-05-17 | DRG: 301 | Disposition: A | Discharge status: Home / Self Care | Payer: Self-pay | Attending: Surgery | Admitting: Surgery

## 2016-05-15 ENCOUNTER — Ambulatory Visit (HOSPITAL_COMMUNITY)
Admission: RE | Admit: 2016-05-15 | Discharge: 2016-05-15 | Disposition: A | Discharge status: Home / Self Care | Payer: PRIVATE HEALTH INSURANCE | Source: Ambulatory Visit | Attending: Orthopedic Surgery | Admitting: Orthopedic Surgery

## 2016-05-15 ENCOUNTER — Encounter (HOSPITAL_COMMUNITY): Payer: Self-pay | Admitting: *Deleted

## 2016-05-15 ENCOUNTER — Other Ambulatory Visit (HOSPITAL_COMMUNITY): Payer: Self-pay | Admitting: Orthopedic Surgery

## 2016-05-15 DIAGNOSIS — Z8249 Family history of ischemic heart disease and other diseases of the circulatory system: Secondary | ICD-10-CM

## 2016-05-15 DIAGNOSIS — M79605 Pain in left leg: Secondary | ICD-10-CM

## 2016-05-15 DIAGNOSIS — I82442 Acute embolism and thrombosis of left tibial vein: Secondary | ICD-10-CM | POA: Insufficient documentation

## 2016-05-15 DIAGNOSIS — I82432 Acute embolism and thrombosis of left popliteal vein: Secondary | ICD-10-CM | POA: Insufficient documentation

## 2016-05-15 DIAGNOSIS — I82402 Acute embolism and thrombosis of unspecified deep veins of left lower extremity: Secondary | ICD-10-CM

## 2016-05-15 DIAGNOSIS — E039 Hypothyroidism, unspecified: Secondary | ICD-10-CM | POA: Diagnosis present

## 2016-05-15 DIAGNOSIS — I82412 Acute embolism and thrombosis of left femoral vein: Principal | ICD-10-CM | POA: Diagnosis present

## 2016-05-15 DIAGNOSIS — Z96643 Presence of artificial hip joint, bilateral: Secondary | ICD-10-CM | POA: Diagnosis present

## 2016-05-15 DIAGNOSIS — Z86718 Personal history of other venous thrombosis and embolism: Secondary | ICD-10-CM | POA: Diagnosis present

## 2016-05-15 DIAGNOSIS — E7800 Pure hypercholesterolemia, unspecified: Secondary | ICD-10-CM | POA: Diagnosis present

## 2016-05-15 DIAGNOSIS — M7989 Other specified soft tissue disorders: Secondary | ICD-10-CM | POA: Diagnosis not present

## 2016-05-15 DIAGNOSIS — I82492 Acute embolism and thrombosis of other specified deep vein of left lower extremity: Secondary | ICD-10-CM | POA: Insufficient documentation

## 2016-05-15 DIAGNOSIS — E785 Hyperlipidemia, unspecified: Secondary | ICD-10-CM | POA: Diagnosis present

## 2016-05-15 DIAGNOSIS — Z79899 Other long term (current) drug therapy: Secondary | ICD-10-CM

## 2016-05-15 DIAGNOSIS — Z7982 Long term (current) use of aspirin: Secondary | ICD-10-CM

## 2016-05-15 DIAGNOSIS — Z87442 Personal history of urinary calculi: Secondary | ICD-10-CM

## 2016-05-15 DIAGNOSIS — F1010 Alcohol abuse, uncomplicated: Secondary | ICD-10-CM | POA: Diagnosis present

## 2016-05-15 DIAGNOSIS — K219 Gastro-esophageal reflux disease without esophagitis: Secondary | ICD-10-CM | POA: Diagnosis present

## 2016-05-15 DIAGNOSIS — I82409 Acute embolism and thrombosis of unspecified deep veins of unspecified lower extremity: Secondary | ICD-10-CM | POA: Diagnosis present

## 2016-05-15 DIAGNOSIS — F17210 Nicotine dependence, cigarettes, uncomplicated: Secondary | ICD-10-CM | POA: Diagnosis present

## 2016-05-15 DIAGNOSIS — I10 Essential (primary) hypertension: Secondary | ICD-10-CM | POA: Diagnosis present

## 2016-05-15 DIAGNOSIS — D759 Disease of blood and blood-forming organs, unspecified: Secondary | ICD-10-CM | POA: Diagnosis present

## 2016-05-15 DIAGNOSIS — Z888 Allergy status to other drugs, medicaments and biological substances status: Secondary | ICD-10-CM

## 2016-05-15 HISTORY — DX: Personal history of other venous thrombosis and embolism: Z86.718

## 2016-05-15 LAB — CBC WITH DIFFERENTIAL/PLATELET
Basophils Absolute: 0 10*3/uL (ref 0.0–0.1)
Basophils Relative: 1 %
EOS PCT: 1 %
Eosinophils Absolute: 0 10*3/uL (ref 0.0–0.7)
HCT: 38.1 % — ABNORMAL LOW (ref 39.0–52.0)
HEMOGLOBIN: 12.7 g/dL — AB (ref 13.0–17.0)
LYMPHS ABS: 1.7 10*3/uL (ref 0.7–4.0)
LYMPHS PCT: 37 %
MCH: 34 pg (ref 26.0–34.0)
MCHC: 33.3 g/dL (ref 30.0–36.0)
MCV: 101.9 fL — AB (ref 78.0–100.0)
Monocytes Absolute: 0.3 10*3/uL (ref 0.1–1.0)
Monocytes Relative: 7 %
Neutro Abs: 2.5 10*3/uL (ref 1.7–7.7)
Neutrophils Relative %: 54 %
PLATELETS: 153 10*3/uL (ref 150–400)
RBC: 3.74 MIL/uL — AB (ref 4.22–5.81)
RDW: 17.7 % — ABNORMAL HIGH (ref 11.5–15.5)
WBC: 4.6 10*3/uL (ref 4.0–10.5)

## 2016-05-15 LAB — CBC
HCT: 34.9 % — ABNORMAL LOW (ref 39.0–52.0)
Hemoglobin: 11.6 g/dL — ABNORMAL LOW (ref 13.0–17.0)
MCH: 33.7 pg (ref 26.0–34.0)
MCHC: 33.2 g/dL (ref 30.0–36.0)
MCV: 101.5 fL — ABNORMAL HIGH (ref 78.0–100.0)
PLATELETS: 147 10*3/uL — AB (ref 150–400)
RBC: 3.44 MIL/uL — AB (ref 4.22–5.81)
RDW: 17.9 % — ABNORMAL HIGH (ref 11.5–15.5)
WBC: 4.6 10*3/uL (ref 4.0–10.5)

## 2016-05-15 LAB — COMPREHENSIVE METABOLIC PANEL
ALT: 34 U/L (ref 17–63)
AST: 38 U/L (ref 15–41)
Albumin: 3.2 g/dL — ABNORMAL LOW (ref 3.5–5.0)
Alkaline Phosphatase: 89 U/L (ref 38–126)
Anion gap: 12 (ref 5–15)
BUN: 5 mg/dL — AB (ref 6–20)
CHLORIDE: 99 mmol/L — AB (ref 101–111)
CO2: 26 mmol/L (ref 22–32)
CREATININE: 0.64 mg/dL (ref 0.61–1.24)
Calcium: 8.8 mg/dL — ABNORMAL LOW (ref 8.9–10.3)
GFR calc Af Amer: >60 mL/min (ref >60)
GFR calc non Af Amer: >60 mL/min (ref >60)
Glucose, Bld: 88 mg/dL (ref 65–99)
POTASSIUM: 3.7 mmol/L (ref 3.5–5.1)
SODIUM: 137 mmol/L (ref 135–145)
Total Bilirubin: 0.6 mg/dL (ref 0.3–1.2)
Total Protein: 6.3 g/dL — ABNORMAL LOW (ref 6.5–8.1)

## 2016-05-15 LAB — BASIC METABOLIC PANEL
Anion gap: 14 (ref 5–15)
BUN: 5 mg/dL — AB (ref 6–20)
CHLORIDE: 97 mmol/L — AB (ref 101–111)
CO2: 27 mmol/L (ref 22–32)
Calcium: 9 mg/dL (ref 8.9–10.3)
Creatinine, Ser: 0.7 mg/dL (ref 0.61–1.24)
GFR calc Af Amer: >60 mL/min (ref >60)
GFR calc non Af Amer: >60 mL/min (ref >60)
Glucose, Bld: 127 mg/dL — ABNORMAL HIGH (ref 65–99)
POTASSIUM: 3.8 mmol/L (ref 3.5–5.1)
SODIUM: 138 mmol/L (ref 135–145)

## 2016-05-15 LAB — URINALYSIS, ROUTINE W REFLEX MICROSCOPIC
Glucose, UA: NEGATIVE mg/dL
Hgb urine dipstick: NEGATIVE
Ketones, ur: NEGATIVE mg/dL
LEUKOCYTES UA: NEGATIVE
NITRITE: NEGATIVE
PH: 5 (ref 5.0–8.0)
Protein, ur: NEGATIVE mg/dL
SPECIFIC GRAVITY, URINE: 1.021 (ref 1.005–1.030)

## 2016-05-15 LAB — PROTIME-INR
INR: 0.96
INR: 1.04
PROTHROMBIN TIME: 13.6 s (ref 11.4–15.2)
Prothrombin Time: 12.8 seconds (ref 11.4–15.2)

## 2016-05-15 MED ORDER — METOPROLOL TARTRATE 5 MG/5ML IV SOLN: 2.0000 mg | INTRAVENOUS | Status: DC | PRN

## 2016-05-15 MED ORDER — PHENOL 1.4 % MT LIQD: 1.0000 | OROMUCOSAL | Status: DC | PRN

## 2016-05-15 MED ORDER — GUAIFENESIN-DM 100-10 MG/5ML PO SYRP: 15.0000 mL | ORAL_SOLUTION | ORAL | Status: DC | PRN

## 2016-05-15 MED ORDER — LABETALOL HCL 5 MG/ML IV SOLN: 10.0000 mg | INTRAVENOUS | Status: DC | PRN

## 2016-05-15 MED ORDER — OXYCODONE HCL 5 MG PO TABS: 5.0000 mg | ORAL_TABLET | ORAL | Status: DC | PRN

## 2016-05-15 MED ORDER — HEPARIN (PORCINE) IN NACL 100-0.45 UNIT/ML-% IJ SOLN
1300.0000 [IU]/h | INTRAMUSCULAR | Status: DC
  Administered 2016-05-15 – 2016-05-16 (×2): 1300 [IU]/h via INTRAVENOUS
  Filled 2016-05-15 (×2): qty 250

## 2016-05-15 MED ORDER — SODIUM CHLORIDE 0.9 % IV SOLN
INTRAVENOUS | Status: DC
  Administered 2016-05-15: 100 mL/h via INTRAVENOUS
  Administered 2016-05-17: 01:00:00 via INTRAVENOUS

## 2016-05-15 MED ORDER — MORPHINE SULFATE (PF) 4 MG/ML IV SOLN: 2.0000 mg | INTRAVENOUS | Status: DC | PRN

## 2016-05-15 MED ORDER — ACETAMINOPHEN 325 MG PO TABS: 325.0000 mg | ORAL_TABLET | ORAL | Status: DC | PRN

## 2016-05-15 MED ORDER — HYDRALAZINE HCL 20 MG/ML IJ SOLN: 5.0000 mg | INTRAMUSCULAR | Status: DC | PRN

## 2016-05-15 MED ORDER — SENNOSIDES-DOCUSATE SODIUM 8.6-50 MG PO TABS: 1.0000 | ORAL_TABLET | Freq: Every evening | ORAL | Status: DC | PRN

## 2016-05-15 MED ORDER — ACETAMINOPHEN 325 MG RE SUPP: 325.0000 mg | RECTAL | Status: DC | PRN

## 2016-05-15 MED ORDER — HEPARIN BOLUS VIA INFUSION
4500.0000 [IU] | Freq: Once | INTRAVENOUS | Status: AC
  Administered 2016-05-15: 4500 [IU] via INTRAVENOUS
  Filled 2016-05-15: qty 4500

## 2016-05-15 MED ORDER — DOCUSATE SODIUM 100 MG PO CAPS
100.0000 mg | ORAL_CAPSULE | Freq: Two times a day (BID) | ORAL | Status: DC
  Administered 2016-05-15 – 2016-05-16 (×2): 100 mg via ORAL
  Filled 2016-05-15 (×3): qty 1

## 2016-05-15 MED ORDER — ALUM & MAG HYDROXIDE-SIMETH 200-200-20 MG/5ML PO SUSP: 15.0000 mL | ORAL | Status: DC | PRN

## 2016-05-15 MED ORDER — POTASSIUM CHLORIDE CRYS ER 20 MEQ PO TBCR
20.0000 meq | EXTENDED_RELEASE_TABLET | Freq: Once | ORAL | Status: AC
  Administered 2016-05-15: 20 meq via ORAL
  Filled 2016-05-15: qty 1

## 2016-05-15 NOTE — Progress Notes (Signed)
ANTICOAGULATION CONSULT NOTE - Initial Consult  Pharmacy Consult for heparin Indication: DVT  Allergies  Allergen Reactions  . Azithromycin Other (See Comments)    History of QT prolongation  . Hctz [Hydrochlorothiazide] Rash  . Tramadol Itching    Patient Measurements: Height: 5\' 9"  (175.3 cm) Weight: 165 lb (74.8 kg) IBW/kg (Calculated) : 70.7 Heparin Dosing Weight: 74.8kg  Vital Signs: Temp: 97.5 F (36.4 C) (03/27 1223) Temp Source: Oral (03/27 1223) BP: 104/86 (03/27 1815) Pulse Rate: 85 (03/27 1815)  Labs:  Recent Labs  05/15/16 1556  HGB 12.7*  HCT 38.1*  PLT 153  LABPROT 12.8  INR 0.96  CREATININE 0.70    Estimated Creatinine Clearance: 112.9 mL/min (by C-G formula based on SCr of 0.7 mg/dL).   Medical History: Past Medical History:  Diagnosis Date  . Alcohol abuse   . B12 deficiency   . Blood dyscrasia    hemachromatosis  . Bronchitis   . Degenerative joint disease (DJD) of hip   . GERD (gastroesophageal reflux disease)   . Gout   . History of kidney stones   . HLD (hyperlipidemia)   . HTN (hypertension)   . Hypothyroidism   . Ventricular tachycardia (HCC)    in setting of hypokalemia/ hypomagnesemia and ETOH  . Wears glasses     Medications:  Infusions:  . sodium chloride 100 mL/hr (05/15/16 1816)  . heparin 1,300 Units/hr (05/15/16 1818)    Assessment: 46 yom presented with an acute DVT s/p recent hip replacement in January. To start IV heparin. Baseline H/H and platelets are WNL. He is no longer taking apixaban PTA.   Goal of Therapy:  Heparin level 0.3-0.7 units/ml Monitor platelets by anticoagulation protocol: Yes   Plan:  Heparin bolus 4500 units IV x 1 Heparin gtt 1300 units/hr Check a 6 hr heparin level Daily heparin level and CBC  Kesley Gaffey, Drake Leachachel Lynn 05/15/2016,6:39 PM

## 2016-05-15 NOTE — H&P (Signed)
[facility]  Patient name: Christian King MRN: [id number] DOB: 08/25/1970 Sex: male   REFERRING PROVIDER:    ER   REASON FOR CONSULT:    Left leg DVT  HISTORY OF PRESENT ILLNESS:   Christian King is a 46 y.o. male, whopresented to the ER today after an u/s revealed an extensive left leg DVT.  The patient underwent a hip replacement in January.  This was complicated by a femur stress fracture which required him to be in a leg brace.  He began having left leg pain and swelling for the past 8 days.  He was given a rx for anticoagulation after his surgery but he could not afford the $500 per month charge and so he was placed on a ASA.  He takes fenofibrate for hypercholesterolemia.  He is a smoker.  He is on a beta blocker for a tachy arrythmia and sees Dr. Johney Frame  PAST MEDICAL HISTORY    Past Medical History:  Diagnosis Date  . Alcohol abuse   . B12 deficiency   . Blood dyscrasia    hemachromatosis  . Bronchitis   . Degenerative joint disease (DJD) of hip   . GERD (gastroesophageal reflux disease)   . Gout   . History of kidney stones   . HLD (hyperlipidemia)   . HTN (hypertension)   . Hypothyroidism   . Ventricular tachycardia (HCC)    in setting of hypokalemia/ hypomagnesemia and ETOH  . Wears glasses      FAMILY HISTORY   Family History  Problem Relation Age of Onset  . Hypertension Father   . Diabetes Maternal Grandmother   . Hypertension Paternal Grandmother   . Stroke Paternal Grandmother     SOCIAL HISTORY:   Social History   Social History  . Marital status: Married    Spouse name: N/A  . Number of children: N/A  . Years of education: N/A   Occupational History  . Not on file.   Social History Main Topics  . Smoking status: Current Every Day Smoker    Packs/day: 0.25    Types: Cigarettes    Start date: 05/05/1985  . Smokeless tobacco: Never Used  . Alcohol use Yes   Comment: daily  . Drug use: No  . Sexual activity: Yes   Other Topics Concern  . Not on file   Social History Narrative   Unemployed.  Previously a Automotive engineer    ALLERGIES:    Allergies  Allergen Reactions  . Azithromycin Other (See Comments)    History of QT prolongation  . Hctz [Hydrochlorothiazide] Rash  . Tramadol Itching    CURRENT MEDICATIONS:    Current Facility-Administered Medications  Medication Dose Route Frequency Provider Last Rate Last Dose  . 0.9 %  sodium chloride infusion   Intravenous Continuous Nada Libman, MD 100 mL/hr at 05/15/16 1816 100 mL/hr at 05/15/16 1816  . acetaminophen (TYLENOL) tablet 325-650 mg  325-650 mg Oral Q4H PRN Nada Libman, MD       Or  . acetaminophen (TYLENOL) suppository 325-650 mg  325-650 mg Rectal Q4H PRN Nada Libman, MD      . alum & mag hydroxide-simeth (MAALOX/MYLANTA) 200-200-20 MG/5ML suspension 15-30 mL  15-30 mL Oral Q2H PRN Nada Libman, MD      . docusate sodium (COLACE) capsule 100 mg  100 mg Oral BID Nada Libman, MD      . guaiFENesin-dextromethorphan [facility] DM)  100-10 MG/5ML syrup 15 mL  15 mL Oral Q4H PRN Nada LibmanVance W Brabham, MD      . heparin ADULT infusion 100 units/mL (25000 units/26050mL sodium chloride 0.45%)  1,300 Units/hr Intravenous Continuous Faye RamsayRachel L Rumbarger, RPH 13 mL/hr at 05/15/16 1818 1,300 Units/hr at 05/15/16 1818  . hydrALAZINE (APRESOLINE) injection 5 mg  5 mg Intravenous Q20 Min PRN Nada LibmanVance W Brabham, MD      . labetalol (NORMODYNE,TRANDATE) injection 10 mg  10 mg Intravenous Q10 min PRN Nada LibmanVance W Brabham, MD      . metoprolol (LOPRESSOR) injection 2-5 mg  2-5 mg Intravenous Q2H PRN Nada LibmanVance W Brabham, MD      . morphine 4 MG/ML injection 2-5 mg  2-5 mg Intravenous Q1H PRN Nada LibmanVance W Brabham, MD      . oxyCODONE (Oxy IR/ROXICODONE) immediate release tablet 5-10 mg  5-10 mg Oral Q4H PRN Nada LibmanVance W Brabham, MD      . phenol (CHLORASEPTIC) mouth spray 1 spray  1 spray  Mouth/Throat PRN Nada LibmanVance W Brabham, MD      . potassium chloride SA (K-DUR,KLOR-CON) CR tablet 20-40 mEq  20-40 mEq Oral Once Nada LibmanVance W Brabham, MD      . senna-docusate (Senokot-S) tablet 1 tablet  1 tablet Oral QHS PRN Nada LibmanVance W Brabham, MD       Current Outpatient Prescriptions  Medication Sig Dispense Refill  . amoxicillin (AMOXIL) 500 MG tablet Take 2,000 mg by mouth daily as needed.    Marland Kitchen. aspirin 325 MG tablet Take 325 mg by mouth daily.    . fenofibrate 54 MG tablet Take 1 tablet by mouth  daily (Patient taking differently: Take 54 mg by mouth daily. ) 90 tablet 1  . ibuprofen (ADVIL,MOTRIN) 200 MG tablet Take 400 mg by mouth every 6 (six) hours as needed for moderate pain.     . magnesium oxide (MAG-OX) 400 MG tablet Take 1 tablet (400 mg total) by mouth daily. (Patient taking differently: Take 400 mg by mouth daily with lunch. ) 90 tablet 1  . metoprolol succinate (TOPROL-XL) 50 MG 24 hr tablet TAKE 1 TABLET BY MOUTH  DAILY WITH/ OR IMMEDIATELY  FOLLOWING A MEAL 90 tablet 1  . potassium chloride (K-DUR,KLOR-CON) 10 MEQ tablet Take 1 tablet by mouth  every day (Patient taking differently: Take 20 mEq by mouth daily with lunch. ) 90 tablet 1  . sildenafil (REVATIO) 20 MG tablet 2-5 prn sex (Patient taking differently: Take 40-100 mg by mouth daily as needed (prior to sexual intercourse). ) 100 tablet 3  . simvastatin (ZOCOR) 40 MG tablet Take 1 tablet (40 mg total) by mouth daily. (Patient taking differently: Take 40 mg by mouth once a week. ) 90 tablet 1  . apixaban (ELIQUIS) 2.5 MG TABS tablet Take 1 tablet (2.5 mg total) by mouth 2 (two) times daily. (Patient not taking: Reported on 05/15/2016) 70 tablet 0    REVIEW OF SYSTEMS:   [X]  denotes positive finding, [ ]  denotes negative finding Cardiac  Comments:  Chest pain or chest pressure:    Shortness of breath upon exertion:    Short of breath when lying flat:    Irregular heart rhythm: x       Vascular    Pain in calf, thigh, or hip  brought on by ambulation:    Pain in feet at night that wakes you up from your sleep:     Blood clot in your veins:    Leg swelling:  x  Pulmonary    Oxygen at home:    Productive cough:     Wheezing:         Neurologic    Sudden weakness in arms or legs:     Sudden numbness in arms or legs:     Sudden onset of difficulty speaking or slurred speech:    Temporary loss of vision in one eye:     Problems with dizziness:         Gastrointestinal    Blood in stool:      Vomited blood:         Genitourinary    Burning when urinating:     Blood in urine:        Psychiatric    Major depression:         Hematologic    Bleeding problems:    Problems with blood clotting too easily:        Skin    Rashes or ulcers:        Constitutional    Fever or chills:     PHYSICAL EXAM:   Vitals:   05/15/16 1730 05/15/16 1745 05/15/16 1800 05/15/16 1815  BP: (!) 129/91 113/81 103/77 104/86  Pulse: 92 89 97 85  Resp:      Temp:      TempSrc:      SpO2: 100% 99% 99% 96%  Weight:      Height:        GENERAL: The patient is a well-nourished male, in no acute distress. The vital signs are documented above. CARDIAC: There is a regular rate and rhythm.  VASCULAR: left calf fullness and tenderness.  Palpable pedal pulses PULMONARY: Nonlabored respirations ABDOMEN: Soft and non-tender with normal pitched bowel sounds.  MUSCULOSKELETAL: There are no major deformities or cyanosis. NEUROLOGIC: No focal weakness or paresthesias are detected. SKIN: There are no ulcers or rashes noted. PSYCHIATRIC: The patient has a normal affect.  STUDIES:   I have reviewed his u/s with the following findings:  There is evidence of acute deep vein thrombosis involving the common femoral, femoral, popliteal, soleal, gastrocnemius,  posterior tibial, and peroneal veins of the left lower extremity. There is no evidence of superficial vein thrombosis involving the left lower extremity. There is no  evidence of deep or superficial vein thrombosis involving the right lower extremity.  ASSESSMENT and PLAN   Extensive left leg DVT:  The patient has extensive clot burden in his left leg which is causing him significant pain and swelling.  This is secondary to hypercoaguable state from hip surgery which required prolonged immobilization.  I would like to get a Iliac/IVC u/s in the am to determine the full extent of his clot.  If he is felt to be a good candidate for venous intervention, we will proceed either tomorrow or Thursday.  I will need to discuss this with Dr. Madelon Lips tomorrow.  It would be preferable to use TPA  He will be NPO after midnight in case we can proceed tomorrow.  He will be started on a heparin drip prior to leaving the ER.  I will hydrate him overnight and check labs in the morning.  We discussed the risks and benefits of the procedure.  We specifically discussed the risk of renal complications, PE and bleeding from TPA.  He understands that he may need multiple proceedures to decreae his clot burden.  I will follow up in the am   Durene Cal, MD [facility] Specialists of  [facility] Tel [phone] Pager [phone]

## 2016-05-15 NOTE — ED Provider Notes (Signed)
[facility]-EMERGENCY DEPT Provider Note   CSN: [id number] Arrival date & time: 05/15/16  1212     History   Chief Complaint Chief Complaint  Patient presents with  . Leg Pain    HPI Christian King is a 46 y.o. male.  HPI  Pt p/w large DVT left leg.  Pt had left hip replacement 1/26 with postoperative hairline femur fracture found 10 days later, with continued use of full leg braces with PT, walker, crutches, motrin and aspirin use.  Developed increased pain throughout his leg and diffuse swelling 8 days ago.  Was sent for US today by orthopedist and found to have large acute DVT involving common femoral, femoral, popliteal, soleal, gastrocnemius,  posterior tibial, and peroneal veins. Pt does note he has had dark urine and a cough x 1 month, denies CP, SOB, fevers.  No weakness or numbness of the extremity.     Past Medical History:  Diagnosis Date  . Alcohol abuse   . B12 deficiency   . Blood dyscrasia    hemachromatosis  . Bronchitis   . Degenerative joint disease (DJD) of hip   . GERD (gastroesophageal reflux disease)   . Gout   . History of kidney stones   . HLD (hyperlipidemia)   . HTN (hypertension)   . Hypothyroidism   . Ventricular tachycardia (HCC)    in setting of hypokalemia/ hypomagnesemia and ETOH  . Wears glasses     Patient Active Problem List   Diagnosis Date Noted  . DVT (deep venous thrombosis) (HCC) 05/15/2016  . Hypokalemia 03/17/2016  . 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  . QT prolongation 03/20/2015  . BMI 25.0-25.9,adult 01/28/2015  . Insomnia 02/05/2014  . Hemochromatosis 10/03/2013  . Alcohol abuse 07/24/2010  . Hypothyroidism 04/26/2010  . B12 DEFICIENCY 04/26/2010  . Hyperlipidemia 04/26/2010  . Gout 04/26/2010  . Essential hypertension 04/26/2010  . h/o VENTRICULAR TACHYCARDIA 04/26/2010    Past Surgical History:  Procedure Laterality Date  . TOTAL HIP ARTHROPLASTY Right   . TOTAL HIP ARTHROPLASTY Left 03/16/2016     Procedure: TOTAL HIP ARTHROPLASTY;  Surgeon: Frederico Hammananiel Caffrey, MD;  [facility];  Service: Orthopedics;  Laterality: Left;  . vascularized fibular graft     right hip for avascular necrosis       Home Medications    Prior to Admission medications   Medication Sig Start Date End Date Taking? Authorizing Provider  amoxicillin (AMOXIL) 500 MG tablet Take 2,000 mg by mouth daily as needed.   Yes Historical Provider, MD  aspirin 325 MG tablet Take 325 mg by mouth daily.   Yes Historical Provider, MD  fenofibrate 54 MG tablet Take 1 tablet by mouth  daily Patient taking differently: Take 54 mg by mouth daily.  01/13/16  Yes Mary-Margaret Daphine DeutscherMartin, FNP  ibuprofen (ADVIL,MOTRIN) 200 MG tablet Take 400 mg by mouth every 6 (six) hours as needed for moderate pain.    Yes Historical Provider, MD  magnesium oxide (MAG-OX) 400 MG tablet Take 1 tablet (400 mg total) by mouth daily. Patient taking differently: Take 400 mg by mouth daily with lunch.  01/28/15  Yes Mary-Margaret Daphine DeutscherMartin, FNP  metoprolol succinate (TOPROL-XL) 50 MG 24 hr tablet TAKE 1 TABLET BY MOUTH  DAILY WITH/ OR IMMEDIATELY  FOLLOWING A MEAL 04/12/16  Yes Mary-Margaret Daphine DeutscherMartin, FNP  potassium chloride (K-DUR,KLOR-CON) 10 MEQ tablet Take 1 tablet by mouth  every day Patient taking differently: Take 20 mEq by mouth daily with lunch.  01/13/16  Yes Mary-Margaret Daphine Deutscher, FNP  sildenafil (REVATIO) 20 MG tablet 2-5 prn sex Patient taking differently: Take 40-100 mg by mouth daily as needed (prior to sexual intercourse).  10/05/15  Yes Mechele Claude, MD  simvastatin (ZOCOR) 40 MG tablet Take 1 tablet (40 mg total) by mouth daily. Patient taking differently: Take 40 mg by mouth once a week.  01/13/16  Yes Mary-Margaret Daphine Deutscher, FNP  apixaban (ELIQUIS) 2.5 MG TABS tablet Take 1 tablet (2.5 mg total) by mouth 2 (two) times daily. Patient not taking: Reported on 05/15/2016 03/16/16   Margart Sickles, PA-C    Family History Family History  Problem  Relation Age of Onset  . Hypertension Father   . Diabetes Maternal Grandmother   . Hypertension Paternal Grandmother   . Stroke Paternal Grandmother     Social History Social History  Substance Use Topics  . Smoking status: Current Every Day Smoker    Packs/day: 0.25    Types: Cigarettes    Start date: 05/05/1985  . Smokeless tobacco: Never Used  . Alcohol use Yes     Comment: daily     Allergies   Azithromycin; Hctz [hydrochlorothiazide]; and Tramadol   Review of Systems Review of Systems  All other systems reviewed and are negative.    Physical Exam Updated Vital Signs BP 103/77   Pulse 97   Temp 97.5 F (36.4 C) (Oral)   Resp 18   Ht 5\' 9"  (1.753 m)   Wt 74.8 kg   SpO2 99%   BMI 24.37 kg/m   Physical Exam  Constitutional: He appears well-developed and well-nourished. No distress.  HENT:  Head: Normocephalic and atraumatic.  Neck: Neck supple.  Cardiovascular: Normal rate, regular rhythm and intact distal pulses.   Pulmonary/Chest: Effort normal and breath sounds normal. No respiratory distress. He has no wheezes. He has no rales.  Abdominal: Soft. He exhibits no distension and no mass. There is no tenderness. There is no rebound and no guarding.  Musculoskeletal:  Left lower extremity with edema in the left calf, larger on left than right.  No skin changes or discoloration.    Neurological: He is alert. He exhibits normal muscle tone.  Skin: He is not diaphoretic.  Nursing note and vitals reviewed.    ED Treatments / Results  Labs (all labs ordered are listed, but only abnormal results are displayed) Labs Reviewed  BASIC METABOLIC PANEL - Abnormal; Notable for the following:       Result Value   Chloride 97 (*)    Glucose, Bld 127 (*)    BUN 5 (*)    All other components within normal limits  CBC WITH DIFFERENTIAL/PLATELET - Abnormal; Notable for the following:    RBC 3.74 (*)    Hemoglobin 12.7 (*)    HCT 38.1 (*)    MCV 101.9 (*)    RDW  17.7 (*)    All other components within normal limits  URINALYSIS, ROUTINE W REFLEX MICROSCOPIC - Abnormal; Notable for the following:    Color, Urine AMBER (*)    Bilirubin Urine SMALL (*)    All other components within normal limits  PROTIME-INR  HIV ANTIBODY (ROUTINE TESTING)  CBC  COMPREHENSIVE METABOLIC PANEL  PROTIME-INR  URINALYSIS, ROUTINE W REFLEX MICROSCOPIC    EKG  EKG Interpretation None       Radiology Dg Chest 2 View  Result Date: 05/15/2016 CLINICAL DATA:  Status post left hip joint replacement in January 2018 and now has but likely involves leg.  No current chest complaints. EXAM: CHEST  2 VIEW COMPARISON:  Chest x-ray of March 05, 2016 FINDINGS: The lungs are well-expanded and clear. The heart and pulmonary vascularity are normal. The mediastinum is normal in width. There is no pleural effusion. The bony thorax exhibits no acute abnormality. IMPRESSION: There is no active cardiopulmonary disease. Electronically Signed   By: David  [place] M.D.   On: 05/15/2016 16:47    Procedures Procedures (including critical care time)  Medications Ordered in ED Medications  potassium chloride SA (K-DUR,KLOR-CON) CR tablet 20-40 mEq (not administered)  alum & mag hydroxide-simeth (MAALOX/MYLANTA) 200-200-20 MG/5ML suspension 15-30 mL (not administered)  labetalol (NORMODYNE,TRANDATE) injection 10 mg (not administered)  hydrALAZINE (APRESOLINE) injection 5 mg (not administered)  metoprolol (LOPRESSOR) injection 2-5 mg (not administered)  guaiFENesin-dextromethorphan (ROBITUSSIN DM) 100-10 MG/5ML syrup 15 mL (not administered)  phenol (CHLORASEPTIC) mouth spray 1 spray (not administered)  0.9 %  sodium chloride infusion (not administered)  acetaminophen (TYLENOL) tablet 325-650 mg (not administered)    Or  acetaminophen (TYLENOL) suppository 325-650 mg (not administered)  morphine 4 MG/ML injection 2-5 mg (not administered)  oxyCODONE (Oxy IR/ROXICODONE) immediate release  tablet 5-10 mg (not administered)  docusate sodium (COLACE) capsule 100 mg (not administered)  senna-docusate (Senokot-S) tablet 1 tablet (not administered)  heparin bolus via infusion 4,500 Units (not administered)  heparin ADULT infusion 100 units/mL (25000 units/225mL sodium chloride 0.45%) (not administered)     Initial Impression / Assessment and Plan / ED Course  I have reviewed the triage vital signs and the nursing notes.  Pertinent labs & imaging results that were available during my care of the patient were reviewed by me and considered in my medical decision making (see chart for details).  Clinical Course as of May 16 1811  Tue May 15, 2016  1647 Discussed pt with Dr Myra Gianotti who will come to ED to see patient.    [EW]    Clinical Course User Index [EW] Trixie Dredge, PA-C    Pt with hip replacement two months ago and continued use of knee immobilizers, on aspirin only p/w large DVT left lower extremity.  Doubt PE.  Pt seen in ED by Dr Myra Gianotti who will admit pt to hospital.  Heparin started in ED.    Final Clinical Impressions(s) / ED Diagnoses   Final diagnoses:  Acute deep vein thrombosis (DVT) of left lower extremity, unspecified vein [facility])    New Prescriptions New Prescriptions   No medications on file     White Bird, PA-C 05/15/16 1814    Derwood Kaplan, MD 05/17/16 1610

## 2016-05-15 NOTE — ED Triage Notes (Signed)
Pt had hip replacement 1/26 and has had increased pain. He reports he had an ultrasound today and was told he has a large clot in his left leg. He was sent here by ortho doctor.

## 2016-05-15 NOTE — ED Notes (Signed)
Vascular Surgeon at bedside. 

## 2016-05-15 NOTE — Progress Notes (Addendum)
**  Preliminary report by tech**  Bilateral lower extremity venous duplex complete. There is evidence of acute deep vein thrombosis involving the common femoral, femoral, popliteal, soleal, gastrocnemius,  posterior tibial, and peroneal veins of the left lower extremity. There is no evidence of superficial vein thrombosis involving the left lower extremity. There is no evidence of deep or superficial vein thrombosis involving the right lower extremity. There is no evidence of a Baker's cyst bilaterally. Results were given to the patient's physician Dr. Madelon Lipsaffrey, and Sharia ReeveJosh, the PA.  The patient was taken to the ED to be admitted for treatment.  05/15/16 12:02 PM Olen CordialGreg Rithik Odea RVT

## 2016-05-16 ENCOUNTER — Inpatient Hospital Stay (HOSPITAL_COMMUNITY): Payer: Self-pay

## 2016-05-16 DIAGNOSIS — Z86718 Personal history of other venous thrombosis and embolism: Secondary | ICD-10-CM

## 2016-05-16 LAB — CBC
HEMATOCRIT: 32.8 % — AB (ref 39.0–52.0)
Hemoglobin: 10.9 g/dL — ABNORMAL LOW (ref 13.0–17.0)
MCH: 33.9 pg (ref 26.0–34.0)
MCHC: 33.2 g/dL (ref 30.0–36.0)
MCV: 101.9 fL — AB (ref 78.0–100.0)
Platelets: 128 10*3/uL — ABNORMAL LOW (ref 150–400)
RBC: 3.22 MIL/uL — ABNORMAL LOW (ref 4.22–5.81)
RDW: 17.9 % — AB (ref 11.5–15.5)
WBC: 5 10*3/uL (ref 4.0–10.5)

## 2016-05-16 LAB — HEPARIN LEVEL (UNFRACTIONATED)
HEPARIN UNFRACTIONATED: 0.47 [IU]/mL (ref 0.30–0.70)
Heparin Unfractionated: 0.48 IU/mL (ref 0.30–0.70)

## 2016-05-16 LAB — HIV ANTIBODY (ROUTINE TESTING W REFLEX): HIV Screen 4th Generation wRfx: NONREACTIVE

## 2016-05-16 MED ORDER — RIVAROXABAN 15 MG PO TABS
15.0000 mg | ORAL_TABLET | Freq: Two times a day (BID) | ORAL | Status: DC
Start: 2016-05-16 — End: 2016-05-17
  Administered 2016-05-16 – 2016-05-17 (×2): 15 mg via ORAL
  Filled 2016-05-16 (×2): qty 1

## 2016-05-16 MED ORDER — SODIUM CHLORIDE 0.9 % IV SOLN
INTRAVENOUS | Status: DC
  Filled 2016-05-16 (×3): qty 100

## 2016-05-16 MED ORDER — DIAZEPAM 5 MG PO TABS: 5.0000 mg | ORAL_TABLET | ORAL | Status: DC

## 2016-05-16 MED ORDER — DIAZEPAM 5 MG/ML IJ SOLN: 5.0000 mg | Freq: Once | INTRAMUSCULAR | Status: DC

## 2016-05-16 MED ORDER — RIVAROXABAN (XARELTO) EDUCATION KIT FOR DVT/PE PATIENTS
PACK | Freq: Once | Status: AC
  Administered 2016-05-16: 19:00:00
  Filled 2016-05-16: qty 1

## 2016-05-16 MED ORDER — RIVAROXABAN 15 MG PO TABS: 15.0000 mg | ORAL_TABLET | Freq: Two times a day (BID) | ORAL | Status: DC

## 2016-05-16 MED ORDER — HEPARIN (PORCINE) IN NACL 100-0.45 UNIT/ML-% IJ SOLN: 1300.0000 [IU]/h | INTRAMUSCULAR | Status: AC

## 2016-05-16 MED ORDER — RIVAROXABAN 20 MG PO TABS: 20.0000 mg | ORAL_TABLET | Freq: Every day | ORAL | Status: DC

## 2016-05-16 NOTE — Discharge Instructions (Signed)
Information on my medicine - XARELTO (rivaroxaban)  This medication education was reviewed with me or my healthcare representative as part of my discharge preparation.  WHY WAS XARELTO PRESCRIBED FOR YOU? Xarelto was prescribed to treat blood clots that have been found in the veins of your legs (deep vein thrombosis) and to reduce the risk of them occurring again.  What do you need to know about Xarelto? The starting dose is one 15 mg tablet taken TWICE daily with food for the FIRST 21 DAYS then on (enter date) April 19th- Thursday the dose is changed to one 20 mg tablet taken ONCE A DAY with a meal.  DO NOT stop taking Xarelto without talking to the health care provider who prescribed the medication.  Refill your prescription for 20 mg tablets before you run out.  After discharge, you should have regular check-up appointments with your healthcare provider that is prescribing your Xarelto.  In the future your dose may need to be changed if your kidney function changes by a significant amount.  What do you do if you miss a dose? If you are taking Xarelto TWICE DAILY and you miss a dose, take it as soon as you remember. You may take two 15 mg tablets (total 30 mg) at the same time then resume your regularly scheduled 15 mg twice daily the next day.  If you are taking Xarelto ONCE DAILY and you miss a dose, take it as soon as you remember on the same day then continue your regularly scheduled once daily regimen the next day. Do not take two doses of Xarelto at the same time.   Important Safety Information Xarelto is a blood thinner medicine that can cause bleeding. You should call your healthcare provider right away if you experience any of the following: ? Bleeding from an injury or your nose that does not stop. ? Unusual colored urine (red or dark brown) or unusual colored stools (red or black). ? Unusual bruising for unknown reasons. ? A serious fall or if you hit your head (even if  there is no bleeding).  Some medicines may interact with Xarelto and might increase your risk of bleeding while on Xarelto. To help avoid this, consult your healthcare provider or pharmacist prior to using any new prescription or non-prescription medications, including herbals, vitamins, non-steroidal anti-inflammatory drugs (NSAIDs) and supplements.  This website has more information on Xarelto: VisitDestination.com.brwww.xarelto.com.

## 2016-05-16 NOTE — Progress Notes (Addendum)
[facility]  Subjective  - States he is feeling better.  He can move his left leg without help from his hands.   He states he could do that yesterday.  He also states he tends to have mmore pain with ambulation and he has not walked today.     Objective 106/81 71 98.1 F (36.7 C) (Oral) 18 100%  Intake/Output Summary (Last 24 hours) at 05/16/16 1505 Last data filed at 05/16/16 0456  Gross per 24 hour  Intake           1204.9 ml  Output              200 ml  Net           1004.9 ml   Left foot/leg active range of motion improved Palpable Dp/PT pulses    Assessment/Planning: Extensive left leg DVT:      Christian King, Christian King 05/16/2016 3:05 PM --  Laboratory Lab Results:  Recent Labs  05/15/16 1810 05/16/16 0013  WBC 4.6 5.0  HGB 11.6* 10.9*  HCT 34.9* 32.8*  PLT 147* 128*   BMET  Recent Labs  05/15/16 1556 05/15/16 1810  NA 138 137  K 3.8 3.7  CL 97* 99*  CO2 27 26  GLUCOSE 127* 88  BUN 5* 5*  CREATININE 0.70 0.64  CALCIUM 9.0 8.8*    COAG Lab Results  Component Value Date   INR 1.04 05/15/2016   INR 0.96 05/15/2016   INR 0.93 03/05/2016   No results found for: PTT  Discussed with patient and his wife, that since he has improved with IV heparin and since his iliac vein and IVC duplex was negative for DVT, we may be able to treat him with just anticoagulation.  I will start Xaralto tonight.  If his pain remains improved we could discharge him tomorrow.  If he is worse, I would consider an attempt at lysis tomorrow.  If he goes home tomorrow and is not improving over the next week, we would still be able to do lysis next week.  I will have him scheduled for a clinic visit next week to check on his progress if he goes home tomorrow.  Durene CalWells Marshal Eskew

## 2016-05-16 NOTE — Progress Notes (Signed)
ANTICOAGULATION CONSULT NOTE - Follow Up Consult  Pharmacy Consult for Heparin  Indication: DVT  Allergies  Allergen Reactions  . Azithromycin Other (See Comments)    History of QT prolongation  . Hctz [Hydrochlorothiazide] Rash  . Tramadol Itching   Patient Measurements: Height: 5\' 9"  (175.3 cm) Weight: 165 lb (74.8 kg) IBW/kg (Calculated) : 70.7  Vital Signs: Temp: 98.2 F (36.8 C) (03/28 0452) Temp Source: Oral (03/28 0452) BP: 114/69 (03/28 0452) Pulse Rate: 74 (03/28 0452)  Labs:  Recent Labs  05/15/16 1556 05/15/16 1810 05/16/16 0013 05/16/16 0948  HGB 12.7* 11.6* 10.9*  --   HCT 38.1* 34.9* 32.8*  --   PLT 153 147* 128*  --   LABPROT 12.8 13.6  --   --   INR 0.96 1.04  --   --   HEPARINUNFRC  --   --  0.47 0.48  CREATININE 0.70 0.64  --   --    Estimated Creatinine Clearance: 112.9 mL/min (by C-G formula based on SCr of 0.64 mg/dL).  Assessment: Christian King is a 46 y.o. male found to have extensive left leg DVT in femoral vein. Recent hip surgery 02/2016 and femur stress fracture requiring leg brace. Patient unable to afford anticoagulant prescribed so was placed on aspirin.  Vascular surgery following with possible intervention today/tomorrow. Heparin infusion has been therapeutic twice with no bleeding or infusion issues. HgB and PLT low, will continue to monitor.  Heparin level therapeutic: 0.48  Goal of Therapy:  Heparin level 0.3-0.7 units/ml Monitor platelets by anticoagulation protocol: Yes   Plan:  Heparin 1300 units/hr Daily heparin level/CBC Monitor for s/sx of bleeding F/U CV surgery recs F/U anticoagulation at D/C  Christian King, PharmD Clinical Pharmacist Pager: [phone] 05/16/2016 10:47 AM

## 2016-05-16 NOTE — Care Management Note (Signed)
Case Management Note Donn PieriniKristi Kidus Delman RN, BSN Unit 2W-Case Manager [phone]  Patient Details  Name: Christian NuttingMichael L King MRN: [id number] Date of Birth: 03/26/1970  Subjective/Objective:   Pt admitted with DVT , hx of hip replacement in Jan, 2018                Action/Plan: PTA pt lived at home- per MD notes was to be on Eliquis post hip surgery but could not afford- pt has no insurance- CM will follow for potential d/c needs- pt currently on IV heparin.   Expected Discharge Date:                  Expected Discharge Plan:  Home/Self Care  In-House Referral:     Discharge planning Services  CM Consult  Post Acute Care Choice:  NA Choice offered to:  NA  DME Arranged:    DME Agency:     HH Arranged:    HH Agency:     Status of Service:  In process, will continue to follow  If discussed at Long Length of Stay Meetings, dates discussed:    Additional Comments:  Darrold SpanWebster, Aubrei Bouchie Hall, RN 05/16/2016, 2:48 PM

## 2016-05-16 NOTE — Progress Notes (Signed)
ANTICOAGULATION CONSULT NOTE - Follow Up Consult  Pharmacy Consult for Heparin  Indication: DVT  Allergies  Allergen Reactions  . Azithromycin Other (See Comments)    History of QT prolongation  . Hctz [Hydrochlorothiazide] Rash  . Tramadol Itching    Patient Measurements: Height: 5\' 9"  (175.3 cm) Weight: 165 lb (74.8 kg) IBW/kg (Calculated) : 70.7  Vital Signs: Temp: 98.3 F (36.8 C) (03/27 2004) Temp Source: Oral (03/27 2004) BP: 115/71 (03/27 2004) Pulse Rate: 83 (03/27 2004)  Labs:  Recent Labs  05/15/16 1556 05/15/16 1810 05/16/16 0013  HGB 12.7* 11.6* 10.9*  HCT 38.1* 34.9* 32.8*  PLT 153 147* 128*  LABPROT 12.8 13.6  --   INR 0.96 1.04  --   HEPARINUNFRC  --   --  0.47  CREATININE 0.70 0.64  --     Estimated Creatinine Clearance: 112.9 mL/min (by C-G formula based on SCr of 0.64 mg/dL).   Assessment: Heparin for extensive left leg DVT, vascular surgery following with possible intervention today/tomorrow, initial heparin level is good at 0.47  Goal of Therapy:  Heparin level 0.3-0.7 units/ml Monitor platelets by anticoagulation protocol: Yes   Plan:  -Cont heparin 1300 units/hr -1000 HL  Christian King 05/16/2016,1:22 AM

## 2016-05-16 NOTE — Progress Notes (Signed)
*  PRELIMINARY RESULTS* Vascular Ultrasound IVC/Iliac venous duplex has been completed.  Preliminary findings: The viaulaized portions of the inferior vena cava and bilateral iliac veins appear patent and free of thrombosis.  Previously noted deep vein thrombosis is again seen in the left femoral vein with a small focal piece in the left common femoral vein.  Tempie DonningCharlotte C Robertta Halfhill 05/16/2016, 10:08 AM

## 2016-05-17 ENCOUNTER — Encounter (HOSPITAL_COMMUNITY)
Admission: EM | Disposition: A | Discharge status: Home / Self Care | Payer: Self-pay | Source: Home / Self Care | Attending: Surgery

## 2016-05-17 ENCOUNTER — Telehealth: Payer: Self-pay | Admitting: Surgery

## 2016-05-17 ENCOUNTER — Ambulatory Visit: Payer: Self-pay | Admitting: Physical Therapy

## 2016-05-17 LAB — CBC
HEMATOCRIT: 31.4 % — AB (ref 39.0–52.0)
HEMOGLOBIN: 10.8 g/dL — AB (ref 13.0–17.0)
MCH: 34.8 pg — AB (ref 26.0–34.0)
MCHC: 34.4 g/dL (ref 30.0–36.0)
MCV: 101.3 fL — AB (ref 78.0–100.0)
Platelets: 119 10*3/uL — ABNORMAL LOW (ref 150–400)
RBC: 3.1 MIL/uL — AB (ref 4.22–5.81)
RDW: 18.3 % — ABNORMAL HIGH (ref 11.5–15.5)
WBC: 3.9 10*3/uL — AB (ref 4.0–10.5)

## 2016-05-17 SURGERY — LOWER EXTREMITY VENOGRAPHY
Anesthesia: LOCAL

## 2016-05-17 MED ORDER — RIVAROXABAN (XARELTO) VTE STARTER PACK (15 & 20 MG): ORAL_TABLET | ORAL | 0 refills | Status: DC

## 2016-05-17 MED ORDER — RIVAROXABAN 20 MG PO TABS: 20.0000 mg | ORAL_TABLET | Freq: Every day | ORAL | 2 refills | Status: DC

## 2016-05-17 MED ORDER — OXYCODONE HCL 5 MG PO TABS: 5.0000 mg | ORAL_TABLET | Freq: Four times a day (QID) | ORAL | 0 refills | Status: DC | PRN

## 2016-05-17 MED FILL — XARELTO STARTER PACK: 15 & 20 | 28 days supply | Qty: 51 | Fill #0

## 2016-05-17 NOTE — Telephone Encounter (Signed)
spoke to pt home # to verify appt date and time

## 2016-05-17 NOTE — Discharge Summary (Signed)
Discharge Summary    Leonette NuttingMichael L Kaner Dec 07, 1970 46 y.o. male  [id number]  Admission Date: 05/15/2016  Discharge Date: 05/17/16  Physician: Nada LibmanVance W Brabham, MD  Admission Diagnosis: Acute deep vein thrombosis (DVT) of left lower extremity, unspecified vein (HCC) [I82.402]   HPI:   This is a 46 y.o. male who presented to the ER today after an u/s revealed an extensive left leg DVT.  The patient underwent a hip replacement in January.  This was complicated by a femur stress fracture which required him to be in a leg brace.  He began having left leg pain and swelling for the past 8 days.  He was given a rx for anticoagulation after his surgery but he could not afford the $500 per month charge and so he was placed on a ASA.  He takes fenofibrate for hypercholesterolemia.  He is a smoker.  He is on a beta blocker for a tachy arrythmia and sees Dr. Johney FrameAllred  Extensive left leg DVT:  The patient has extensive clot burden in his left leg which is causing him significant pain and swelling.  This is secondary to hypercoaguable state from hip surgery which required prolonged immobilization.  I would like to get a Iliac/IVC u/s in the am to determine the full extent of his clot.  If he is felt to be a good candidate for venous intervention, we will proceed either tomorrow or Thursday.  I will need to discuss this with Dr. Madelon Lipsaffrey tomorrow.  [facility]  Hospital Course:  The patient was admitted to the hospital and started on a heparin gtt.  He was hydrated in case of thrombolysis.  Dr. Myra GianottiBrabham discussed with him specifically the risk of renal complications, PE and bleeding from TPA.  He understands that he may need multiple proceedures to decreae his clot burden.  I will follow up in the am.  An IVC/iliac venous duplex was obtained and The viaulaized portions of the inferior vena cava and bilateral iliac veins appear patent and free of thrombosis.  Previously noted deep vein  thrombosis is again seen in the left femoral vein with a small focal piece in the left common femoral vein.  By the next morning, the pt's pain was much improved.  Dr. Myra GianottiBrabham Discussed with patient and his wife, that since he has improved with IV heparin and since his iliac vein and IVC duplex was negative for DVT, we may be able to treat him with just anticoagulation.  I will start Xaralto tonight.  If his pain remains improved we could discharge him tomorrow.  If he is worse, I would consider an attempt at lysis tomorrow.  If he goes home tomorrow and is not improving over the next week, we would still be able to do lysis next week.  I will have him scheduled for a clinic visit next week to check on his progress if he goes home tomorrow.  His pain remains improved and will be discharged home with Xarelto starter pack and xarelto 20mg  daily to start after he finishes the starter pack.  He is given an Rx for compression stockings thigh high 20-230mmHg to wear daily.    The remainder of the hospital course consisted of increasing mobilization and increasing intake of solids without difficulty.  CBC    Component Value Date/Time   WBC 3.9 (L) 05/17/2016 0357   RBC 3.10 (L) 05/17/2016 0357   HGB 10.8 (L) 05/17/2016 0357   HCT 31.4 (L)  05/17/2016 0357   HCT 42.2 05/12/2015 0958   PLT 119 (L) 05/17/2016 0357   PLT 106 (L) 05/12/2015 0958   MCV 101.3 (H) 05/17/2016 0357   MCV 106 (H) 05/12/2015 0958   MCH 34.8 (H) 05/17/2016 0357   MCHC 34.4 05/17/2016 0357   RDW 18.3 (H) 05/17/2016 0357   RDW 14.9 05/12/2015 0958   LYMPHSABS 1.7 05/15/2016 1556   LYMPHSABS 1.1 05/12/2015 0958   MONOABS 0.3 05/15/2016 1556   EOSABS 0.0 05/15/2016 1556   EOSABS 0.1 05/12/2015 0958   BASOSABS 0.0 05/15/2016 1556   BASOSABS 0.0 05/12/2015 0958    BMET    Component Value Date/Time   NA 137 05/15/2016 1810   NA 146 (H) 01/06/2016 1630   K 3.7 05/15/2016 1810   CL 99 (L) 05/15/2016 1810   CO2 26 05/15/2016  1810   GLUCOSE 88 05/15/2016 1810   BUN 5 (L) 05/15/2016 1810   BUN 7 01/06/2016 1630   CREATININE 0.64 05/15/2016 1810   CALCIUM 8.8 (L) 05/15/2016 1810   GFRNONAA >60 05/15/2016 1810   GFRAA >60 05/15/2016 1810      Discharge Instructions    Call MD for:  redness, tenderness, or signs of infection (pain, swelling, bleeding, redness, odor or green/yellow discharge around incision site)    Complete by:  As directed    Call MD for:  severe or increased pain, loss or decreased feeling  in affected limb(s)    Complete by:  As directed    Call MD for:  temperature >100.5    Complete by:  As directed    Discharge instructions    Complete by:  As directed    You have a prescription for thigh high compression stockings 20-54mmHg to wear daily that you can get at [facility] in [place].   Discharge instructions    Complete by:  As directed    Start Xarelto 20mg  daily once you have finished the Xarelto starter pack.  You have a prescription for both to get filled.   Driving Restrictions    Complete by:  As directed    Do not drive for 24 hours and while taking pain medication.   Resume previous diet    Complete by:  As directed       Discharge Diagnosis:  Acute deep vein thrombosis (DVT) of left lower extremity, unspecified vein (HCC) [I82.402]  Secondary Diagnosis: Patient Active Problem List   Diagnosis Date Noted  . DVT (deep venous thrombosis) (HCC) 05/15/2016  . Hypokalemia 03/17/2016  . 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  . QT prolongation 03/20/2015  . BMI 25.0-25.9,adult 01/28/2015  . Insomnia 02/05/2014  . Hemochromatosis 10/03/2013  . Alcohol abuse 07/24/2010  . Hypothyroidism 04/26/2010  . B12 DEFICIENCY 04/26/2010  . Hyperlipidemia 04/26/2010  . Gout 04/26/2010  . Essential hypertension 04/26/2010  . h/o VENTRICULAR TACHYCARDIA 04/26/2010   Past Medical History:  Diagnosis Date  . Alcohol abuse   . B12 deficiency   . Blood  dyscrasia    hemachromatosis  . Bronchitis   . Degenerative joint disease (DJD) of hip   . GERD (gastroesophageal reflux disease)   . Gout   . History of kidney stones   . HLD (hyperlipidemia)   . HTN (hypertension)   . Hypothyroidism   . Ventricular tachycardia (HCC)    in setting of hypokalemia/ hypomagnesemia and ETOH  . Wears glasses      Allergies as of 05/17/2016      Reactions  Azithromycin Other (See Comments)   History of QT prolongation   Hctz [hydrochlorothiazide] Rash   Tramadol Itching      Medication List    STOP taking these medications   apixaban 2.5 MG Tabs tablet Commonly known as:  ELIQUIS     TAKE these medications   amoxicillin 500 MG tablet Commonly known as:  AMOXIL Take 2,000 mg by mouth daily as needed.   aspirin 325 MG tablet Take 325 mg by mouth daily.   fenofibrate 54 MG tablet Take 1 tablet by mouth  daily What changed:  how much to take  how to take this  when to take this  additional instructions   ibuprofen 200 MG tablet Commonly known as:  ADVIL,MOTRIN Take 400 mg by mouth every 6 (six) hours as needed for moderate pain.   magnesium oxide 400 MG tablet Commonly known as:  MAG-OX Take 1 tablet (400 mg total) by mouth daily. What changed:  when to take this   metoprolol succinate 50 MG 24 hr tablet Commonly known as:  TOPROL-XL TAKE 1 TABLET BY MOUTH  DAILY WITH/ OR IMMEDIATELY  FOLLOWING A MEAL   oxyCODONE 5 MG immediate release tablet Commonly known as:  Oxy IR/ROXICODONE Take 1 tablet (5 mg total) by mouth every 6 (six) hours as needed for moderate pain.   potassium chloride 10 MEQ tablet Commonly known as:  K-DUR,KLOR-CON Take 1 tablet by mouth  every day What changed:  how much to take  how to take this  when to take this  additional instructions   Rivaroxaban 15 & 20 MG Tbpk Take as directed on package: Start with one 15mg  tablet by mouth twice a day with food. On Day 22, switch to one 20mg  tablet  once a day with food.   rivaroxaban 20 MG Tabs tablet Commonly known as:  XARELTO Take 1 tablet (20 mg total) by mouth daily. Start taking on:  06/14/2016   sildenafil 20 MG tablet Commonly known as:  REVATIO 2-5 prn sex What changed:  how much to take  how to take this  when to take this  reasons to take this  additional instructions   simvastatin 40 MG tablet Commonly known as:  ZOCOR Take 1 tablet (40 mg total) by mouth daily. What changed:  when to take this       Prescriptions given: Roxicodone #8 No Refill Xarelto starter pack #1 NR Xarelto 20mg  daily to start on 06/14/16 (when he is finished with starter pack) #30 2RF  Instructions: 1.  No driving for 24 hours and while taking pain medication  Disposition: home  Patient's condition: is Good  Follow up: 1. Dr. Randie Heinz on May 25, 2016   Doreatha Massed, [facility] [phone] 05/17/2016  8:24 AM

## 2016-05-17 NOTE — Progress Notes (Signed)
CM received consult: Pt being discharged on Xarelto.Marland Kitchen.appreciate help with financial assist. Thanks!. CM spoke with pt and provided pt with 30 day free Xarelto card. Patient assistance form ( Xarelto) given to pt  for pt and MD completion. Pt is to bring  form to post hospital f/u appointment for MD to complete and office is to fax to manufacturer for processing. Gae GallopAngela Zhion Pevehouse RN,BSN,CM

## 2016-05-17 NOTE — Telephone Encounter (Signed)
-----   Message from Sharee PimpleMarilyn K McChesney, RN sent at 05/17/2016  9:21 AM EDT ----- Regarding: 05-25-16 with Dr. Randie Heinzain only   ----- Message ----- From: Dara LordsSamantha J Rhyne, PA-C Sent: 05/17/2016   8:04 AM To: [facility] Charge Pool  This pt needs to f/u with Dr. Randie Heinzain (MD only) next Friday, 4/6 per Dr. Myra GianottiBrabham.  He does not need any studies.  Thanks, Lelon MastSamantha

## 2016-05-20 ENCOUNTER — Other Ambulatory Visit: Payer: Self-pay | Admitting: Nurse Practitioner

## 2016-05-21 ENCOUNTER — Ambulatory Visit: Payer: Self-pay | Attending: Internal Medicine

## 2016-05-21 NOTE — Telephone Encounter (Signed)
Forwarding to PCP/MMM 

## 2016-05-22 ENCOUNTER — Ambulatory Visit: Payer: 59 | Admitting: Nurse Practitioner

## 2016-05-22 NOTE — Telephone Encounter (Signed)
Refill called to [facility] 

## 2016-05-22 NOTE — Telephone Encounter (Signed)
Please call in lorazepam with 0 refills 

## 2016-05-25 ENCOUNTER — Encounter: Payer: Self-pay | Admitting: Vascular Surgery

## 2016-05-25 ENCOUNTER — Ambulatory Visit (INDEPENDENT_AMBULATORY_CARE_PROVIDER_SITE_OTHER): Payer: Self-pay | Admitting: Vascular Surgery

## 2016-05-25 VITALS — BP 126/87 | HR 104 | Temp 97.1°F | Resp 16 | Ht 69.0 in | Wt 169.0 lb

## 2016-05-25 DIAGNOSIS — I82412 Acute embolism and thrombosis of left femoral vein: Secondary | ICD-10-CM

## 2016-05-25 NOTE — Progress Notes (Signed)
Subjective:     Patient ID: Christian King, male   DOB: 17-May-1970, 46 y.o.   MRN: [id number]  HPI 46 year old male recently underwent hip replacement and was found in his follow-up to have an extensive left lower extremity DVT. In the hospital he was improving with anticoagulation and did not undergo any intervention and was discharged. He now follows up stating that he has had 1 episode of swelling of his left leg but otherwise is doing okay. He is maintained on relative and also takes aspirin. Overall his pain has almost resolved he is back to walking is his left with orthopedic surgeon. He has no complaints today related to his DVT. He has been wearing knee-high compression stockings.   Review of Systems Minimal swelling    Objective:   Physical Exam aaox3 Non labored respirations Abdomen is soft, ntnd, without masses Palpable left dp/pt No edema ble    Assessment/plan     38 male follows up from recent extensive left lower extremity DVT. At this time his symptoms have mostly resolved other than 1 episode of swelling. I recommend him get thigh-high compression stockings and he should be maintained on Xarelto for at least 3 but more likely 6 months given the extent of the process. He does have a inciting event being his hip surgery. He will follow-up in 6 months with venous reflux study and if at that time he is doing well and does not need intervention for his venous disease without post-thrombotic syndrome he can likely be discharged from the office. All questions were answered.   Christian King C. Randie Heinz, MD [facility] [place] Office: [phone] Pager: [phone]

## 2016-05-28 NOTE — Addendum Note (Signed)
Addended by: Burton Apley A on: 05/28/2016 09:18 AM   Modules accepted: Orders

## 2016-05-29 ENCOUNTER — Encounter: Payer: Self-pay | Admitting: Nurse Practitioner

## 2016-05-29 ENCOUNTER — Ambulatory Visit (INDEPENDENT_AMBULATORY_CARE_PROVIDER_SITE_OTHER): Payer: Self-pay | Admitting: Nurse Practitioner

## 2016-05-29 VITALS — BP 118/82 | HR 96 | Temp 96.9°F | Ht 69.0 in | Wt 169.0 lb

## 2016-05-29 DIAGNOSIS — I824Y2 Acute embolism and thrombosis of unspecified deep veins of left proximal lower extremity: Secondary | ICD-10-CM

## 2016-05-29 DIAGNOSIS — Z09 Encounter for follow-up examination after completed treatment for conditions other than malignant neoplasm: Secondary | ICD-10-CM

## 2016-05-29 MED ORDER — RIVAROXABAN 20 MG PO TABS
20.0000 mg | ORAL_TABLET | Freq: Every day | ORAL | 11 refills | Status: DC
Start: 2016-05-29 — End: 2017-04-09

## 2016-05-29 NOTE — Patient Instructions (Signed)
Deep Vein Thrombosis A deep vein thrombosis (DVT) is a blood clot (thrombus) that usually occurs in a deep, larger vein of the lower leg or the pelvis, or in an upper extremity such as the arm. These are dangerous and can lead to serious and even life-threatening complications if the clot travels to the lungs. A DVT can damage the valves in your leg veins so that instead of flowing upward, the blood pools in the lower leg. This is called post-thrombotic syndrome, and it can result in pain, swelling, discoloration, and sores on the leg. What are the causes? A DVT is caused by the formation of a blood clot in your leg, pelvis, or arm. Usually, several things contribute to the formation of blood clots. A clot may develop when:  Your blood flow slows down.  Your vein becomes damaged in some way.  You have a condition that makes your blood clot more easily.  What increases the risk? A DVT is more likely to develop in:  People who are older, especially over 60 years of age.  People who are overweight (obese).  People who sit or lie still for a long time, such as during long-distance travel (over 4 hours), bed rest, hospitalization, or during recovery from certain medical conditions like a stroke.  People who do not engage in much physical activity (sedentary lifestyle).  People who have chronic breathing disorders.  People who have a personal or family history of blood clots or blood clotting disease.  People who have peripheral vascular disease (PVD), diabetes, or some types of cancer.  People who have heart disease, especially if the person had a recent heart attack or has congestive heart failure.  People who have neurological diseases that affect the legs (leg paresis).  People who have had a traumatic injury, such as breaking a hip or leg.  People who have recently had major or lengthy surgery, especially on the hip, knee, or abdomen.  People who have had a central line placed  inside a large vein.  People who take medicines that contain the hormone estrogen. These include birth control pills and hormone replacement therapy.  Pregnancy or during childbirth or the postpartum period.  Long plane flights (over 8 hours).  What are the signs or symptoms?  Symptoms of a DVT can include:  Swelling of your leg or arm, especially if one side is much worse.  Warmth and redness of your leg or arm, especially if one side is much worse.  Pain in your arm or leg. If the clot is in your leg, symptoms may be more noticeable or worse when you stand or walk.  A feeling of pins and needles, if the clot is in the arm.  The symptoms of a DVT that has traveled to the lungs (pulmonary embolism, PE) usually start suddenly and include:  Shortness of breath while active or at rest.  Coughing or coughing up blood or blood-tinged mucus.  Chest pain that is often worse with deep breaths.  Rapid or irregular heartbeat.  Feeling light-headed or dizzy.  Fainting.  Feeling anxious.  Sweating.  There may also be pain and swelling in a leg if that is where the blood clot started. These symptoms may represent a serious problem that is an emergency. Do not wait to see if the symptoms will go away. Get medical help right away. Call your local emergency services (911 in the [place] Do not drive yourself to the hospital. How is this diagnosed? Your health   care provider will take a medical history and perform a physical exam. You may also have other tests, including:  Blood tests to assess the clotting properties of your blood.  Imaging tests, such as CT, ultrasound, MRI, X-ray, and other tests to see if you have clots anywhere in your body.  How is this treated? After a DVT is identified, it can be treated. The type of treatment that you receive depends on many factors, such as the cause of your DVT, your risk for bleeding or developing more clots, and other medical conditions that  you have. Sometimes, a combination of treatments is necessary. Treatment options may be combined and include:  Monitoring the blood clot with ultrasound.  Taking medicines by mouth, such as newer blood thinners (anticoagulants), thrombolytics, or warfarin.  Taking anticoagulant medicine by injection or through an IV tube.  Wearing compression stockings or using different types ofdevices.  Surgery (rare) to remove the blood clot or to place a filter in your abdomen to stop the blood clot from traveling to your lungs.  Treatments for a DVT are often divided into immediate treatment and long-term treatment (up to 3 months after DVT). You can work with your health care provider to choose the treatment program that is best for you. Follow these instructions at home: If you are taking a newer oral anticoagulant:  Take the medicine every single day at the same time each day.  Understand what foods and drugs interact with this medicine.  Understand that there are no regular blood tests required when using this medicine.  Understand the side effects of this medicine, including excessive bruising or bleeding. Ask your health care provider or pharmacist about other possible side effects. If you are taking warfarin:  Understand how to take warfarin and know which foods can affect how warfarin works in your body.  Understand that it is dangerous to take too much or too little warfarin. Too much warfarin increases the risk of bleeding. Too little warfarin continues to allow the risk for blood clots.  Follow your PT and INR blood testing schedule. The PT and INR results allow your health care provider to adjust your dose of warfarin. It is very important that you have your PT and INR tested as often as told by your health care provider.  Avoid major changes in your diet, or tell your health care provider before you change your diet. Arrange a visit with a registered dietitian to answer your  questions. Many foods, especially foods that are high in vitamin K, can interfere with warfarin and affect the PT and INR results. Eat a consistent amount of foods that are high in vitamin K, such as: ? Spinach, kale, broccoli, cabbage, collard greens, turnip greens, Brussels sprouts, peas, cauliflower, seaweed, and parsley. ? Beef liver and pork liver. ? Green tea. ? Soybean oil.  Tell your health care provider about any and all medicines, vitamins, and supplements that you take, including aspirin and other over-the-counter anti-inflammatory medicines. Be especially cautious with aspirin and anti-inflammatory medicines. Do not take those before you ask your health care provider if it is safe to do so. This is important because many medicines can interfere with warfarin and affect the PT and INR results.  Do not start or stop taking any over-the-counter or prescription medicine unless your health care provider or pharmacist tells you to do so. If you take warfarin, you will also need to do these things:  Hold pressure over cuts for longer than   usual.  Tell your dentist and other health care providers that you are taking warfarin before you have any procedures in which bleeding may occur.  Avoid alcohol or drink very small amounts. Tell your health care provider if you change your alcohol intake.  Do not use tobacco products, including cigarettes, chewing tobacco, and e-cigarettes. If you need help quitting, ask your health care provider.  Avoid contact sports.  General instructions  Take over-the-counter and prescription medicines only as told by your health care provider. Anticoagulant medicines can have side effects, including easy bruising and difficulty stopping bleeding. If you are prescribed an anticoagulant, you will also need to do these things: ? Hold pressure over cuts for longer than usual. ? Tell your dentist and other health care providers that you are taking anticoagulants  before you have any procedures in which bleeding may occur. ? Avoid contact sports.  Wear a medical alert bracelet or carry a medical alert card that says you have had a PE.  Ask your health care provider how soon you can go back to your normal activities. Stay active to prevent new blood clots from forming.  Make sure to exercise while traveling or when you have been sitting or standing for a long period of time. It is very important to exercise. Exercise your legs by walking or by tightening and relaxing your leg muscles often. Take frequent walks.  Wear compression stockings as told by your health care provider to help prevent more blood clots from forming.  Do not use tobacco products, including cigarettes, chewing tobacco, and e-cigarettes. If you need help quitting, ask your health care provider.  Keep all follow-up appointments with your health care provider. This is important. How is this prevented? Take these actions to decrease your risk of developing another DVT:  Exercise regularly. For at least 30 minutes every day, engage in: ? Activity that involves moving your arms and legs. ? Activity that encourages good blood flow through your body by increasing your heart rate.  Exercise your arms and legs every hour during long-distance travel (over 4 hours). Drink plenty of water and avoid drinking alcohol while traveling.  Avoid sitting or lying in bed for long periods of time without moving your legs.  Maintain a weight that is appropriate for your height. Ask your health care provider what weight is healthy for you.  If you are a woman who is over 35 years of age, avoid unnecessary use of medicines that contain estrogen. These include birth control pills.  Do not smoke, especially if you take estrogen medicines. If you need help quitting, ask your health care provider.  If you are hospitalized, prevention measures may include:  Early walking after surgery, as soon as your  health care provider says that it is safe.  Receiving anticoagulants to prevent blood clots.If you cannot take anticoagulants, other options may be available, such as wearing compression stockings or using different types of devices.  Get help right away if:  You have new or increased pain, swelling, or redness in an arm or leg.  You have numbness or tingling in an arm or leg.  You have shortness of breath while active or at rest.  You have chest pain.  You have a rapid or irregular heartbeat.  You feel light-headed or dizzy.  You cough up blood.  You notice blood in your vomit, bowel movement, or urine. These symptoms may represent a serious problem that is an emergency. Do not wait to see   if the symptoms will go away. Get medical help right away. Call your local emergency services (911 in the [place] Do not drive yourself to the hospital. This information is not intended to replace advice given to you by your health care provider. Make sure you discuss any questions you have with your health care provider. Document Released: 02/05/2005 Document Revised: 07/14/2015 Document Reviewed: 06/02/2014 [employer] Interactive Patient Education  2017 [employer]  

## 2016-05-29 NOTE — Progress Notes (Signed)
   Subjective:    Patient ID: Christian King, male    DOB: 1970-03-30, 46 y.o.   MRN: [id number]  HPI  Patient here today for hospital follow up - was seen 05/25/16 with DVT- According to records they admitted him and  Was going to have surgery to remove blood clot- got better while waiting for surgery so they decided to just start  on xeralto. His leg swelling has gone down. Denies any SOB.  * Patient cannot afford xeralto so has paper work to fill out to get free from company.    Review of Systems  Constitutional: Negative.   HENT: Negative.   Respiratory: Negative.   Cardiovascular: Negative.   Genitourinary: Negative.   Neurological: Negative.   Psychiatric/Behavioral: Negative.   All other systems reviewed and are negative.      Objective:   Physical Exam  Constitutional: He is oriented to person, place, and time. He appears well-developed and well-nourished. No distress.  Cardiovascular: Normal rate and regular rhythm.   Pulmonary/Chest: Effort normal and breath sounds normal.  Musculoskeletal:  Left knee brace intact  Neurological: He is alert and oriented to person, place, and time.  Skin: Skin is warm.  Psychiatric: He has a normal mood and affect. His behavior is normal. Judgment and thought content normal.    BP 118/82   Pulse 96   Temp (!) 96.9 F (36.1 C) (Oral)   Ht  (1.753 m)   Wt 169 lb (76.7 kg)   BMI 24.96 kg/m       Assessment & Plan:   1. Hospital discharge follow-up   2. Acute deep vein thrombosis (DVT) of proximal vein of left lower extremity (HCC)    Meds ordered this encounter  Medications  . rivaroxaban (XARELTO) 20 MG TABS tablet    Sig: Take 1 tablet (20 mg total) by mouth daily with supper.    Dispense:  30 tablet    Refill:  11    Order Specific Question:   Supervising Provider    Answer:   Johna Sheriff [[id number]]   To ER if develops sudden SOB Papers filled out or drug company RTO prn  Mary-Margaret Daphine Deutscher, [place]

## 2016-06-06 ENCOUNTER — Ambulatory Visit: Payer: Self-pay | Attending: Orthopedic Surgery | Admitting: Physical Therapy

## 2016-06-06 DIAGNOSIS — M6281 Muscle weakness (generalized): Secondary | ICD-10-CM | POA: Insufficient documentation

## 2016-06-06 NOTE — Therapy (Signed)
[facility] [address] [place], [place], [address] Phone: [phone]   Fax:  [phone]  Physical Therapy Evaluation  Patient Details  Name: Christian King MRN: [id number] Date of Birth: 1970-11-22 Referring Provider: Frederico Hamman MD.  Encounter Date: 06/06/2016      PT End of Session - 06/06/16 1219    Visit Number 1   Number of Visits 16   Date for PT Re-Evaluation 08/01/16   PT Start Time 1038   PT Stop Time 1114   PT Time Calculation (min) 36 min   Activity Tolerance Patient tolerated treatment well   Behavior During Therapy [facility] for tasks assessed/performed      Past Medical History:  Diagnosis Date  . Alcohol abuse   . B12 deficiency   . Blood dyscrasia    hemachromatosis  . Bronchitis   . Degenerative joint disease (DJD) of hip   . GERD (gastroesophageal reflux disease)   . Gout   . History of kidney stones   . HLD (hyperlipidemia)   . HTN (hypertension)   . Hypothyroidism   . Ventricular tachycardia (HCC)    in setting of hypokalemia/ hypomagnesemia and ETOH  . Wears glasses     Past Surgical History:  Procedure Laterality Date  . TOTAL HIP ARTHROPLASTY Right   . TOTAL HIP ARTHROPLASTY Left 03/16/2016   Procedure: TOTAL HIP ARTHROPLASTY;  Surgeon: Frederico Hamman, MD;  Location: [facility] OR;  Service: Orthopedics;  Laterality: Left;  . vascularized fibular graft     right hip for avascular necrosis    There were no vitals filed for this visit.       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  He states he was doing well and had just finished his HEP and went to get up and heard a loud "pop" and then felt extreme pain near his left knee.  He was found to have a periprosthetic fracture.  He was placed in an immobilizer and was NWB for 3 weeks and then in a hinged brace beginning with 50% weight bearing than now WBAT.  He is  still in the hinged brace which allows a range of 0 to 90 degrees.  He is out of it while sleeping.  He was also found to have a very extensive left femoral vein DVT and he is currently on a blood thinner and may be up to 6 months.  he is wearing a compression sock.  He is well educated in his left hip precautions.   Pertinent History Left DVT.  Right total hip replacement.   How long can you walk comfortably? Short community distances.   Diagnostic tests Doppler.   Patient Stated Goals Walk without pain.   Currently in Pain? No/denies            [facility] PT Assessment - 06/06/16 0001      Assessment   Medical Diagnosis S/p left total hip replacement.   Referring Provider Frederico Hamman MD.   Onset Date/Surgical Date --  03/16/16 (surgery date).     Precautions   Precautions --  Left knee brace. Total hip precautions. LT DVT.     Restrictions   Weight Bearing Restrictions No     Balance Screen   Has the patient fallen in the past 6 months No   Has the patient had a decrease in activity level because of a fear of falling?  Yes   Is the patient reluctant to leave their home because of a fear of falling?  No     Home Environment   Living Environment Private residence     Prior Function   Level of Independence Independent     Observation/Other Assessments   Observations Left hip incision well healed.     Posture/Postural Control   Posture Comments Normal LE alignment.     ROM / Strength   AROM / PROM / Strength AROM;Strength     AROM   Overall AROM Comments Left hip flexion in supine within precautions.  Left knee flexion easily to 90 degrees without pain.     Strength   Overall Strength Comments Left hip flexion and abduction a solid 4 to 4+/5.  Left knee graded only grossly at 4/5.     Palpation   Palpation comment No palpable areas of tenderness.     Ambulation/Gait   Gait Comments Nearly normal gait cycle with left knee brace donned.                    [facility] Adult PT Treatment/Exercise - 06/06/16 0001      Exercises   Exercises Knee/Hip     Knee/Hip Exercises: Aerobic   Nustep Level 3 x10 minutes with left knee brace on.                     PT Long Term Goals - 06/06/16 1346      PT LONG TERM GOAL #1   Title Independent with a HEP.   Time 8   Period Weeks   Status New     PT LONG TERM GOAL #2   Title Walk a community distance with pain not > 3/10.   Time 8   Period Weeks     PT LONG TERM GOAL #3   Title Increase left LE strength to a 5/5 to increase stability for functional tasks.   Time 8   Period Weeks   Status New     PT LONG TERM GOAL #4   Title Perform a reciprocating stair gait with one railing with pain not > 2-3/10.   Time 8   Period Weeks   Status New               Plan - 06/06/16 1259    Clinical Impression Statement The patient presents to OPPT s/p left total hip replacement.  He is well educated with regards to his left hip precautions.  He is currently in a left knee brace from 0 to 90 degrees.  He has a left LE DVT and is on blood thinners.  He is WBAT over his left LE.  Patient will benefit from skilled physical to reach functional independence.   Rehab Potential Good   PT Frequency 2x / week   PT Duration 8 weeks   PT Treatment/Interventions ADLs/Self Care Home Management;Cryotherapy;Electrical Stimulation;Functional mobility training;Stair training;Gait training;Moist Heat;Therapeutic activities;Therapeutic exercise;Neuromuscular re-education;Patient/family education;Manual techniques   PT Next Visit Plan Nustep; O and CKC exercises; stair training.  Modalities if needed.   Consulted and Agree with Plan of Care Patient      Patient will benefit from skilled therapeutic intervention in order to improve the following deficits and impairments:  Decreased activity tolerance, Decreased strength, Decreased range of motion  Visit Diagnosis: Muscle weakness (generalized) - Plan: PT  plan of care cert/re-cert     Problem List Patient Active Problem List   Diagnosis Date  Noted  . DVT (deep venous thrombosis) (HCC) 05/15/2016  . Hypokalemia 03/17/2016  . 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  . QT prolongation 03/20/2015  . BMI 25.0-25.9,adult 01/28/2015  . Insomnia 02/05/2014  . Hemochromatosis 10/03/2013  . Alcohol abuse 07/24/2010  . Hypothyroidism 04/26/2010  . B12 DEFICIENCY 04/26/2010  . Hyperlipidemia 04/26/2010  . Gout 04/26/2010  . Essential hypertension 04/26/2010  . h/o VENTRICULAR TACHYCARDIA 04/26/2010    Christian King, [place] MPT 06/06/2016, 2:09 PM  [facility] [address] [place], [place], [address] Phone: [phone]   Fax:  [phone]  Name: Christian King MRN: [id number] Date of Birth: Nov 24, 1970

## 2016-06-08 ENCOUNTER — Ambulatory Visit: Payer: Self-pay | Admitting: Physical Therapy

## 2016-06-08 DIAGNOSIS — M6281 Muscle weakness (generalized): Secondary | ICD-10-CM

## 2016-06-08 NOTE — Therapy (Signed)
[facility] [address] [place], [place], [address] Phone: [phone]   Fax:  [phone]  Physical Therapy Treatment  Patient Details  Name: Christian King MRN: [id number] Date of Birth: [birth year] Referring Provider: Frederico Hamman MD.  Encounter Date: 06/08/2016      PT End of Session - 06/08/16 1206    Visit Number 2   Number of Visits 16   Date for PT Re-Evaluation 08/01/16      Past Medical History:  Diagnosis Date  . Alcohol abuse   . B12 deficiency   . Blood dyscrasia    hemachromatosis  . Bronchitis   . Degenerative joint disease (DJD) of hip   . GERD (gastroesophageal reflux disease)   . Gout   . History of kidney stones   . HLD (hyperlipidemia)   . HTN (hypertension)   . Hypothyroidism   . Ventricular tachycardia (HCC)    in setting of hypokalemia/ hypomagnesemia and ETOH  . Wears glasses     Past Surgical History:  Procedure Laterality Date  . TOTAL HIP ARTHROPLASTY Right   . TOTAL HIP ARTHROPLASTY Left 03/16/2016   Procedure: TOTAL HIP ARTHROPLASTY;  Surgeon: Frederico Hamman, MD;  Location: [facility] OR;  Service: Orthopedics;  Laterality: Left;  . vascularized fibular graft     right hip for avascular necrosis    There were no vitals filed for this visit.      Subjective Assessment - 06/08/16 1105    Subjective No new complaints.   Currently in Pain? No/denies                         [facility] Adult PT Treatment/Exercise - 06/08/16 0001      Neuro Re-ed    Neuro Re-ed Details  Bosu ball and Rockerboard x 12 minutes total.     Exercises   Exercises Knee/Hip     Knee/Hip Exercises: Aerobic   Nustep Level 5 x 20 minutes.     Knee/Hip Exercises: Seated   Long Arc Quad Limitations 5# x 6 minutes.                     PT Long Term Goals - 06/06/16 1346      PT LONG TERM GOAL #1   Title Independent with a HEP.   Time 8   Period Weeks   Status New     PT LONG TERM  GOAL #2   Title Walk a community distance with pain not > 3/10.   Time 8   Period Weeks     PT LONG TERM GOAL #3   Title Increase left LE strength to a 5/5 to increase stability for functional tasks.   Time 8   Period Weeks   Status New     PT LONG TERM GOAL #4   Title Perform a reciprocating stair gait with one railing with pain not > 2-3/10.   Time 8   Period Weeks   Status New               Plan - 06/08/16 1114    Clinical Impression Statement (P)  Patient did excellent today.  Used brace throughout.      Patient will benefit from skilled therapeutic intervention in order to improve the following deficits and impairments:  (P) Decreased activity tolerance, Decreased strength, Decreased range of motion  Visit Diagnosis: Muscle weakness (generalized)     Problem List Patient Active Problem List  Diagnosis Date Noted  . DVT (deep venous thrombosis) (HCC) 05/15/2016  . Hypokalemia 03/17/2016  . 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  . QT prolongation 03/20/2015  . BMI 25.0-25.9,adult 01/28/2015  . Insomnia 02/05/2014  . Hemochromatosis 10/03/2013  . Alcohol abuse 07/24/2010  . Hypothyroidism 04/26/2010  . B12 DEFICIENCY 04/26/2010  . Hyperlipidemia 04/26/2010  . Gout 04/26/2010  . Essential hypertension 04/26/2010  . h/o VENTRICULAR TACHYCARDIA 04/26/2010    Rakel Junio, [place] 06/08/2016, 12:26 PM  [facility] [address] [place], [place], [address] Phone: [phone]   Fax:  [phone]  Name: Christian King MRN: [id number] Date of Birth: 18-May-1970

## 2016-06-11 ENCOUNTER — Encounter: Payer: Self-pay | Admitting: Physical Therapy

## 2016-06-12 ENCOUNTER — Encounter: Payer: Self-pay | Admitting: Physical Therapy

## 2016-06-13 ENCOUNTER — Ambulatory Visit: Payer: Self-pay | Admitting: Physical Therapy

## 2016-06-13 DIAGNOSIS — M6281 Muscle weakness (generalized): Secondary | ICD-10-CM

## 2016-06-13 NOTE — Therapy (Signed)
[facility] [address] [place], [place], [address] Phone: [phone]   Fax:  [phone]  Physical Therapy Treatment  Patient Details  Name: OLOF MARCIL MRN: [id number] Date of Birth: [birth year] Referring Provider: Frederico Hamman MD.  Encounter Date: 06/13/2016      PT End of Session - 06/13/16 1623    Visit Number 3   Number of Visits 16   Date for PT Re-Evaluation 08/01/16   PT Start Time 0147   PT Stop Time 0231   PT Time Calculation (min) 44 min   Activity Tolerance Patient tolerated treatment well   Behavior During Therapy [facility] for tasks assessed/performed      Past Medical History:  Diagnosis Date  . Alcohol abuse   . B12 deficiency   . Blood dyscrasia    hemachromatosis  . Bronchitis   . Degenerative joint disease (DJD) of hip   . GERD (gastroesophageal reflux disease)   . Gout   . History of kidney stones   . HLD (hyperlipidemia)   . HTN (hypertension)   . Hypothyroidism   . Ventricular tachycardia (HCC)    in setting of hypokalemia/ hypomagnesemia and ETOH  . Wears glasses     Past Surgical History:  Procedure Laterality Date  . TOTAL HIP ARTHROPLASTY Right   . TOTAL HIP ARTHROPLASTY Left 03/16/2016   Procedure: TOTAL HIP ARTHROPLASTY;  Surgeon: Frederico Hamman, MD;  Location: [facility] OR;  Service: Orthopedics;  Laterality: Left;  . vascularized fibular graft     right hip for avascular necrosis    There were no vitals filed for this visit.      Subjective Assessment - 06/13/16 1419    Subjective I'm doing good.   Currently in Pain? No/denies                         [facility] Adult PT Treatment/Exercise - 06/13/16 0001      Neuro Re-ed    Neuro Re-ed Details  BOSU ball x 5 minutes.     Exercises   Exercises Knee/Hip     Knee/Hip Exercises: Aerobic   Nustep Level 6 x 20 minutes.     Knee/Hip Exercises: Seated   Long Arc Quad Limitations 7 1/2# x 5 minutes.     Knee/Hip  Exercises: Supine   Short Arc Quad Sets Limitations 7 1/2 # x 6 minutes.     Knee/Hip Exercises: Sidelying   Hip ABduction Limitations 2 sets to fatigue.                     PT Long Term Goals - 06/06/16 1346      PT LONG TERM GOAL #1   Title Independent with a HEP.   Time 8   Period Weeks   Status New     PT LONG TERM GOAL #2   Title Walk a community distance with pain not > 3/10.   Time 8   Period Weeks     PT LONG TERM GOAL #3   Title Increase left LE strength to a 5/5 to increase stability for functional tasks.   Time 8   Period Weeks   Status New     PT LONG TERM GOAL #4   Title Perform a reciprocating stair gait with one railing with pain not > 2-3/10.   Time 8   Period Weeks   Status New  Plan - 06/13/16 1624    Clinical Impression Statement Excellent job today with increase resistance.  No pain complaints.      Patient will benefit from skilled therapeutic intervention in order to improve the following deficits and impairments:     Visit Diagnosis: Muscle weakness (generalized)     Problem List Patient Active Problem List   Diagnosis Date Noted  . DVT (deep venous thrombosis) (HCC) 05/15/2016  . Hypokalemia 03/17/2016  . 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  . QT prolongation 03/20/2015  . BMI 25.0-25.9,adult 01/28/2015  . Insomnia 02/05/2014  . Hemochromatosis 10/03/2013  . Alcohol abuse 07/24/2010  . Hypothyroidism 04/26/2010  . B12 DEFICIENCY 04/26/2010  . Hyperlipidemia 04/26/2010  . Gout 04/26/2010  . Essential hypertension 04/26/2010  . h/o VENTRICULAR TACHYCARDIA 04/26/2010    [place], [place]  MPT 06/13/2016, 4:26 PM  [facility] [address] [place], [place], [address] Phone: [phone]   Fax:  [phone]  Name: DAO MEMMOTT MRN: [id number] Date of Birth: May 03, 1970

## 2016-06-15 ENCOUNTER — Ambulatory Visit: Payer: Self-pay | Admitting: *Deleted

## 2016-06-15 DIAGNOSIS — M6281 Muscle weakness (generalized): Secondary | ICD-10-CM

## 2016-06-15 NOTE — Therapy (Signed)
[facility] [address] [place], [place], [address] Phone: [phone]   Fax:  [phone]  Physical Therapy Treatment  Patient Details  Name: Christian King MRN: [id number] Date of Birth: October 20, 1970 Referring Provider: Frederico Hamman MD.  Encounter Date: 06/15/2016      PT End of Session - 06/15/16 1114    Visit Number 4   Number of Visits 16   Date for PT Re-Evaluation 08/01/16   PT Start Time 1035   PT Stop Time 1124   PT Time Calculation (min) 49 min      Past Medical History:  Diagnosis Date  . Alcohol abuse   . B12 deficiency   . Blood dyscrasia    hemachromatosis  . Bronchitis   . Degenerative joint disease (DJD) of hip   . GERD (gastroesophageal reflux disease)   . Gout   . History of kidney stones   . HLD (hyperlipidemia)   . HTN (hypertension)   . Hypothyroidism   . Ventricular tachycardia (HCC)    in setting of hypokalemia/ hypomagnesemia and ETOH  . Wears glasses     Past Surgical History:  Procedure Laterality Date  . TOTAL HIP ARTHROPLASTY Right   . TOTAL HIP ARTHROPLASTY Left 03/16/2016   Procedure: TOTAL HIP ARTHROPLASTY;  Surgeon: Frederico Hamman, MD;  Location: [facility] OR;  Service: Orthopedics;  Laterality: Left;  . vascularized fibular graft     right hip for avascular necrosis    There were no vitals filed for this visit.      Subjective Assessment - 06/15/16 1036    Subjective I'm doing good. LT hip is doing ok   Pertinent History Left DVT.  Right total hip replacement.   How long can you walk comfortably? Short community distances.   Diagnostic tests Doppler.   Patient Stated Goals Walk without pain.   Currently in Pain? No/denies                         [facility] Adult PT Treatment/Exercise - 06/15/16 0001      Neuro Re-ed    Neuro Re-ed Details  , Rocker board x 5 mins   SBA     Exercises   Exercises Knee/Hip     Knee/Hip Exercises: Aerobic   Nustep Level 6 x 20   minutes.  with hip precautions     Knee/Hip Exercises: Standing   Hip Flexion --   Hip Abduction AROM;2 sets;20 reps     Knee/Hip Exercises: Seated   Long Arc Quad Limitations 7 1/2# x 5 minutes.     Knee/Hip Exercises: Sidelying   Hip ABduction Limitations 2 sets to fatigue.AAROM                     PT Long Term Goals - 06/06/16 1346      PT LONG TERM GOAL #1   Title Independent with a HEP.   Time 8   Period Weeks   Status New     PT LONG TERM GOAL #2   Title Walk a community distance with pain not > 3/10.   Time 8   Period Weeks     PT LONG TERM GOAL #3   Title Increase left LE strength to a 5/5 to increase stability for functional tasks.   Time 8   Period Weeks   Status New     PT LONG TERM GOAL #4   Title Perform a reciprocating stair gait  with one railing with pain not > 2-3/10.   Time 8   Period Weeks   Status New               Plan - 06/15/16 1117    Clinical Impression Statement Pt arrived to clinic wearing knee brace on LT LE with WBAT and feels that LT LE is doing a little better with strength. He still has a deviated gait due to weakness and pain. He was able to complete all therex today for LT LE and did well. He still has strength deficits with hip abduction and needs assistance in sidelying   Rehab Potential Good   PT Frequency 2x / week   PT Duration 8 weeks   PT Treatment/Interventions ADLs/Self Care Home Management;Cryotherapy;Electrical Stimulation;Functional mobility training;Stair training;Gait training;Moist Heat;Therapeutic activities;Therapeutic exercise;Neuromuscular re-education;Patient/family education;Manual techniques   PT Next Visit Plan Nustep; O and CKC exercises; stair training.  Modalities if needed.   Consulted and Agree with Plan of Care Patient      Patient will benefit from skilled therapeutic intervention in order to improve the following deficits and impairments:  Decreased activity tolerance, Decreased  strength, Decreased range of motion  Visit Diagnosis: Muscle weakness (generalized)     Problem List Patient Active Problem List   Diagnosis Date Noted  . DVT (deep venous thrombosis) (HCC) 05/15/2016  . Hypokalemia 03/17/2016  . 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  . QT prolongation 03/20/2015  . BMI 25.0-25.9,adult 01/28/2015  . Insomnia 02/05/2014  . Hemochromatosis 10/03/2013  . Alcohol abuse 07/24/2010  . Hypothyroidism 04/26/2010  . B12 DEFICIENCY 04/26/2010  . Hyperlipidemia 04/26/2010  . Gout 04/26/2010  . Essential hypertension 04/26/2010  . h/o VENTRICULAR TACHYCARDIA 04/26/2010    RAMSEUR,CHRIS, PTA 06/15/2016, 12:46 PM  [facility] [address] [place], [place], [address] Phone: [phone]   Fax:  [phone]  Name: Christian King MRN: [id number] Date of Birth: Jun 27, 1970

## 2016-06-19 ENCOUNTER — Ambulatory Visit: Payer: 59 | Admitting: *Deleted

## 2016-06-20 ENCOUNTER — Other Ambulatory Visit: Payer: Self-pay | Admitting: Nurse Practitioner

## 2016-06-20 NOTE — Telephone Encounter (Signed)
Forward to PCP/MMM 

## 2016-06-21 NOTE — Telephone Encounter (Signed)
Please call in ativan 2mg  1 po prn #15  with 0 refills

## 2016-06-22 ENCOUNTER — Ambulatory Visit: Payer: 59 | Attending: Orthopedic Surgery | Admitting: Physical Therapy

## 2016-06-22 DIAGNOSIS — M6281 Muscle weakness (generalized): Secondary | ICD-10-CM | POA: Insufficient documentation

## 2016-06-22 NOTE — Telephone Encounter (Signed)
Rx called in 

## 2016-06-22 NOTE — Therapy (Signed)
[facility] [address] [place], [place], [address] Phone: [phone]   Fax:  [phone]  Physical Therapy Treatment  Patient Details  Name: Christian King MRN: [id number] Date of Birth: [birth year] Referring Provider: Frederico Hammananiel Caffrey MD.  Encounter Date: 06/22/2016      PT End of Session - 06/22/16 1149    Visit Number 5   Number of Visits 16   Date for PT Re-Evaluation 08/01/16   PT Start Time 1115   PT Stop Time 1208   PT Time Calculation (min) 53 min      Past Medical History:  Diagnosis Date  . Alcohol abuse   . B12 deficiency   . Blood dyscrasia    hemachromatosis  . Bronchitis   . Degenerative joint disease (DJD) of hip   . GERD (gastroesophageal reflux disease)   . Gout   . History of kidney stones   . HLD (hyperlipidemia)   . HTN (hypertension)   . Hypothyroidism   . Ventricular tachycardia (HCC)    in setting of hypokalemia/ hypomagnesemia and ETOH  . Wears glasses     Past Surgical History:  Procedure Laterality Date  . TOTAL HIP ARTHROPLASTY Right   . TOTAL HIP ARTHROPLASTY Left 03/16/2016   Procedure: TOTAL HIP ARTHROPLASTY;  Surgeon: Frederico Hammananiel Caffrey, MD;  [facility];  Service: Orthopedics;  Laterality: Left;  . vascularized fibular graft     right hip for avascular necrosis    There were no vitals filed for this visit.      Subjective Assessment - 06/22/16 1150    Subjective Dr. pleased.  Got an X-ray and everything is healed well.  No brace anymore.  My quadriceps are sore also.   Currently in Pain? Yes   Pain Score 2    Pain Location Leg   Pain Orientation Left   Pain Descriptors / Indicators Sore   Pain Onset More than a month ago   Pain Frequency Occasional                         [facility] Adult PT Treatment/Exercise - 06/22/16 0001      Exercises   Exercises Knee/Hip     Knee/Hip Exercises: Aerobic   Stationary Bike Progressing to level 4 over 18 minutes.      Knee/Hip Exercises: Machines for Strengthening   Cybex Knee Extension 10# with slow reps and resting as needed over 5 minutes.     Modalities   Modalities Corporate investment bankerlectrical Stimulation     Electrical Stimulation   Electrical Stimulation Location Left quadriceps.   Electrical Stimulation Action 4 electrode Pre-mod.   Electrical Stimulation Parameters 80-150 Hz x 20 minutes (5 sec on and 5 sec off).   Electrical Stimulation Goals Tone;Pain                     PT Long Term Goals - 06/06/16 1346      PT LONG TERM GOAL #1   Title Independent with a HEP.   Time 8   Period Weeks   Status New     PT LONG TERM GOAL #2   Title Walk a community distance with pain not > 3/10.   Time 8   Period Weeks     PT LONG TERM GOAL #3   Title Increase left LE strength to a 5/5 to increase stability for functional tasks.   Time 8   Period Weeks   Status New  PT LONG TERM GOAL #4   Title Perform a reciprocating stair gait with one railing with pain not > 2-3/10.   Time 8   Period Weeks   Status New               Plan - 06/22/16 1155    Clinical Impression Statement The patient did great today with stationary bike and bilateral knee extension machine with 10#.      Patient will benefit from skilled therapeutic intervention in order to improve the following deficits and impairments:     Visit Diagnosis: Muscle weakness (generalized)     Problem List Patient Active Problem List   Diagnosis Date Noted  . DVT (deep venous thrombosis) (HCC) 05/15/2016  . Hypokalemia 03/17/2016  . 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  . QT prolongation 03/20/2015  . BMI 25.0-25.9,adult 01/28/2015  . Insomnia 02/05/2014  . Hemochromatosis 10/03/2013  . Alcohol abuse 07/24/2010  . Hypothyroidism 04/26/2010  . B12 DEFICIENCY 04/26/2010  . Hyperlipidemia 04/26/2010  . Gout 04/26/2010  . Essential hypertension 04/26/2010  . h/o VENTRICULAR TACHYCARDIA 04/26/2010     [place], [place] MPT 06/22/2016, 12:15 PM  [facility] [address] [place], [place], [address] Phone: [phone]   Fax:  [phone]  Name: Christian King MRN: [id number] Date of Birth: Apr 05, 1970

## 2016-06-25 ENCOUNTER — Encounter: Payer: Self-pay | Admitting: Physical Therapy

## 2016-06-27 ENCOUNTER — Telehealth: Payer: Self-pay | Admitting: *Deleted

## 2016-06-27 ENCOUNTER — Encounter: Payer: Self-pay | Admitting: Physical Therapy

## 2016-06-27 NOTE — Telephone Encounter (Signed)
Mr. Mayford KnifeWilliams called triage to report a vague area of soreness in the crease of his right groin and scrotum. This has been sore x 3-4 days. He has stopped wearing his brace recently d/t a femur stress fracture (s/p left total hip arthroplasty by Dr. Frederico Hammananiel Caffrey 03-17-16) and he thinks he may have done too much walking over the weekend. Per Dr. Darcella Cheshireain's 05-25-16 office note; he was seen in follow-up to have an extensive left lower extremity DVT. While in the hospital he was improving with anticoagulation and did not undergo any intervention and was discharged.  Today, he states that he doesn't have pain with direct pressure to the area, there is no knot, swelling or erythema noted by patient. There is no temperature or color change in his legs. He went to PT today and wasn't able to have his session after telling the therapist about the soreness. He is scheduled to resume PT on Friday of this week.He states that he has been religiously taking his Xarelto and ASA 325mg . I went over the S&S of DVT with him and he will call us on Friday if he is having this soreness. [facility] if he should have any S&S of DVT or status change.

## 2016-06-29 ENCOUNTER — Encounter: Payer: Self-pay | Admitting: Physical Therapy

## 2016-07-03 ENCOUNTER — Ambulatory Visit: Payer: 59 | Admitting: Physical Therapy

## 2016-07-03 DIAGNOSIS — M6281 Muscle weakness (generalized): Secondary | ICD-10-CM

## 2016-07-03 NOTE — Therapy (Signed)
[facility] [address] [place], [place], [address] Phone: [phone]   Fax:  [phone]  Physical Therapy Treatment  Patient Details  Name: Christian NuttingMichael L Spatz MRN: [id number] Date of Birth: 30-Dec-1970 Referring Provider: Frederico Hammananiel Caffrey MD.  Encounter Date: 07/03/2016      PT End of Session - 07/03/16 1419    Visit Number 6   Number of Visits 16   Date for PT Re-Evaluation 08/01/16   PT Start Time 0102   PT Stop Time 0154   PT Time Calculation (min) 52 min   Activity Tolerance Patient tolerated treatment well   Behavior During Therapy [facility] for tasks assessed/performed      Past Medical History:  Diagnosis Date  . Alcohol abuse   . B12 deficiency   . Blood dyscrasia    hemachromatosis  . Bronchitis   . Degenerative joint disease (DJD) of hip   . GERD (gastroesophageal reflux disease)   . Gout   . History of kidney stones   . HLD (hyperlipidemia)   . HTN (hypertension)   . Hypothyroidism   . Ventricular tachycardia (HCC)    in setting of hypokalemia/ hypomagnesemia and ETOH  . Wears glasses     Past Surgical History:  Procedure Laterality Date  . TOTAL HIP ARTHROPLASTY Right   . TOTAL HIP ARTHROPLASTY Left 03/16/2016   Procedure: TOTAL HIP ARTHROPLASTY;  Surgeon: Frederico Hammananiel Caffrey, MD;  [facility];  Service: Orthopedics;  Laterality: Left;  . vascularized fibular graft     right hip for avascular necrosis    There were no vitals filed for this visit.      Subjective Assessment - 07/03/16 1517    Subjective I felt great after last treatment.  I was doing some home activites and flared-up my hip but I'm better now.                         [facility] Treatment/Exercise - 07/03/16 0001      Exercises   Exercises Knee/Hip     Knee/Hip Exercises: Aerobic   Stationary Bike x 20 minutes.     Knee/Hip Exercises: Machines for Strengthening   Cybex Knee Extension 10# x 5 minutes (bilateral).      Modalities   Modalities Doctor, general practicelectrical Stimulation     Electrical Stimulation   Electrical Stimulation Location Left quads.   Electrical Stimulation Action 4 electrode pre-mod   Electrical Stimulation Parameters 80-150 hz x 20 minutes.   Electrical Stimulation Goals Tone;Pain                     PT Long Term Goals - 06/06/16 1346      PT LONG TERM GOAL #1   Title Independent with a HEP.   Time 8   Period Weeks   Status New     PT LONG TERM GOAL #2   Title Walk a community distance with pain not > 3/10.   Time 8   Period Weeks     PT LONG TERM GOAL #3   Title Increase left LE strength to a 5/5 to increase stability for functional tasks.   Time 8   Period Weeks   Status New     PT LONG TERM GOAL #4   Title Perform a reciprocating stair gait with one railing with pain not > 2-3/10.   Time 8   Period Weeks   Status New  Plan - 07/03/16 1518    Clinical Impression Statement Excellent job today.  Patient wearing bilateral compression socks.      Patient will benefit from skilled therapeutic intervention in order to improve the following deficits and impairments:  Decreased activity tolerance, Decreased strength, Decreased range of motion  Visit Diagnosis: Muscle weakness (generalized)     Problem List Patient Active Problem List   Diagnosis Date Noted  . DVT (deep venous thrombosis) (HCC) 05/15/2016  . Hypokalemia 03/17/2016  . 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  . QT prolongation 03/20/2015  . BMI 25.0-25.9,adult 01/28/2015  . Insomnia 02/05/2014  . Hemochromatosis 10/03/2013  . Alcohol abuse 07/24/2010  . Hypothyroidism 04/26/2010  . B12 DEFICIENCY 04/26/2010  . Hyperlipidemia 04/26/2010  . Gout 04/26/2010  . Essential hypertension 04/26/2010  . h/o VENTRICULAR TACHYCARDIA 04/26/2010    Genelda Roark, [place] MPT 07/03/2016, 3:20 PM  [facility] [address] [place], [place], [address] Phone: [phone]   Fax:  [phone]  Name: ARMARION GREEK MRN: [id number] Date of Birth: 08/26/1970

## 2016-07-05 ENCOUNTER — Encounter: Payer: Self-pay | Admitting: Physical Therapy

## 2016-07-09 ENCOUNTER — Ambulatory Visit: Payer: 59 | Admitting: Physical Therapy

## 2016-07-09 DIAGNOSIS — M6281 Muscle weakness (generalized): Secondary | ICD-10-CM | POA: Diagnosis not present

## 2016-07-09 NOTE — Therapy (Addendum)
[facility] [place], [place], [address] Phone: [phone]   Fax:  [phone]  Physical Therapy Treatment  Patient Details  Name: Christian King MRN: [id number] Date of Birth: [birth year] Referring Provider: Earlie Server MD.  Encounter Date: 07/09/2016      PT End of Session - 07/09/16 1421    Visit Number 7   Number of Visits 16   Date for PT Re-Evaluation 08/01/16   PT Start Time 0150   PT Stop Time 0240   PT Time Calculation (min) 50 min   Activity Tolerance Patient tolerated treatment well   Behavior During Therapy [facility] for tasks assessed/performed      Past Medical History:  Diagnosis Date  . Alcohol abuse   . B12 deficiency   . Blood dyscrasia    hemachromatosis  . Bronchitis   . Degenerative joint disease (DJD) of hip   . GERD (gastroesophageal reflux disease)   . Gout   . History of kidney stones   . HLD (hyperlipidemia)   . HTN (hypertension)   . Hypothyroidism   . Ventricular tachycardia ([place])    in setting of hypokalemia/ hypomagnesemia and ETOH  . Wears glasses     Past Surgical History:  Procedure Laterality Date  . TOTAL HIP ARTHROPLASTY Right   . TOTAL HIP ARTHROPLASTY Left 03/16/2016   Procedure: TOTAL HIP ARTHROPLASTY;  Surgeon: Earlie Server, MD;  Location: [place];  Service: Orthopedics;  Laterality: Left;  . vascularized fibular graft     right hip for avascular necrosis    There were no vitals filed for this visit.      Subjective Assessment - 07/09/16 1419    Subjective I'm doing really good and may make this my last visit.   Pain Score 2    Pain Location Leg   Pain Orientation Left   Pain Descriptors / Indicators Sore   Pain Onset More than a month ago                         [facility] Adult PT Treatment/Exercise - 07/09/16 0001      Exercises   Exercises Knee/Hip     Knee/Hip Exercises: Aerobic   Stationary Bike x 20 minutes at level 5.     Knee/Hip Exercises: Machines for Strengthening   Cybex Knee Extension 20# x 5 minutes.     Modalities   Modalities Chiropractor Stimulation Location Left quadriceps.   Electrical Stimulation Action 4 electrodes Pre-mod over 4 trigger points.   Electrical Stimulation Parameters 80-150 Hz x 20 minutes (5 sec on and 5 sec off).   Electrical Stimulation Goals Tone;Pain                PT Education - 07/09/16 1420    Education provided Yes   Person(s) Educated Patient   Methods Explanation;Demonstration;Verbal cues   Comprehension Verbalized understanding;Returned demonstration             PT Long Term Goals - 07/09/16 1427      PT LONG TERM GOAL #1   Title Independent with a HEP.   Time 8   Period Weeks   Status Achieved     PT LONG TERM GOAL #2   Title Walk a community distance with pain not > 3/10.   Time 8   Period Weeks   Status Achieved     PT LONG TERM GOAL #  3   Title Increase left LE strength to a 5/5 to increase stability for functional tasks.   Baseline Left hip abduction= 4+/5.   Time 8   Period Weeks   Status Not Met     PT LONG TERM GOAL #4   Title Perform a reciprocating stair gait with one railing with pain not > 2-3/10.   Time 8   Period Weeks   Status Achieved               Plan - 07/09/16 1425    Clinical Impression Statement Excellent progress toward goals.  Patient feels he may be ready for discharge.  Left hip abduction = 4+/5.   Consulted and Agree with Plan of Care Patient      Patient will benefit from skilled therapeutic intervention in order to improve the following deficits and impairments:     Visit Diagnosis: Muscle weakness (generalized)     Problem List Patient Active Problem List   Diagnosis Date Noted  . DVT (deep venous thrombosis) ([place]) 05/15/2016  . Hypokalemia 03/17/2016  . 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  . QT prolongation  03/20/2015  . BMI 25.0-25.9,adult 01/28/2015  . Insomnia 02/05/2014  . Hemochromatosis 10/03/2013  . Alcohol abuse 07/24/2010  . Hypothyroidism 04/26/2010  . B12 DEFICIENCY 04/26/2010  . Hyperlipidemia 04/26/2010  . Gout 04/26/2010  . Essential hypertension 04/26/2010  . h/o VENTRICULAR TACHYCARDIA 04/26/2010    Terricka Onofrio, [place] MPT 07/09/2016, 2:57 PM  [facility] [address] [place], [place], [address] Phone: [phone]   Fax:  [phone]  Name: Christian King MRN: [id number] Date of Birth: [birth year]  PHYSICAL THERAPY DISCHARGE SUMMARY  Visits from Start of Care: 7.  Current functional level related to goals / functional outcomes: See above.   Remaining deficits: Patient not returning to PT.   Education / Equipment: HEP. Plan: Patient agrees to discharge.  Patient goals were not met. Patient is being discharged due to not returning since the last visit.  ?????         Mali Magdalene Tardiff MPT

## 2016-07-09 NOTE — Patient Instructions (Signed)
SDLY hip abduction.

## 2016-07-24 ENCOUNTER — Other Ambulatory Visit: Payer: Self-pay | Admitting: Nurse Practitioner

## 2016-07-24 NOTE — Telephone Encounter (Signed)
Last filled 06/22/16, last seen 05/29/16. Call in

## 2016-07-26 NOTE — Telephone Encounter (Signed)
Please call in lorazapam with 1 refills 

## 2016-07-26 NOTE — Telephone Encounter (Signed)
rx called into pharmacy

## 2016-08-21 ENCOUNTER — Other Ambulatory Visit: Payer: Self-pay

## 2016-08-21 DIAGNOSIS — E876 Hypokalemia: Secondary | ICD-10-CM

## 2016-08-21 MED ORDER — POTASSIUM CHLORIDE CRYS ER 10 MEQ PO TBCR: EXTENDED_RELEASE_TABLET | ORAL | 1 refills | Status: DC

## 2016-08-27 ENCOUNTER — Encounter (HOSPITAL_COMMUNITY): Payer: Self-pay | Admitting: Emergency Medicine

## 2016-08-27 ENCOUNTER — Emergency Department (HOSPITAL_COMMUNITY)
Admission: EM | Admit: 2016-08-27 | Discharge: 2016-08-27 | Disposition: A | Discharge status: Home / Self Care | Payer: 59 | Attending: Emergency Medicine | Admitting: Emergency Medicine

## 2016-08-27 DIAGNOSIS — Y998 Other external cause status: Secondary | ICD-10-CM | POA: Insufficient documentation

## 2016-08-27 DIAGNOSIS — I10 Essential (primary) hypertension: Secondary | ICD-10-CM | POA: Insufficient documentation

## 2016-08-27 DIAGNOSIS — W260XXA Contact with knife, initial encounter: Secondary | ICD-10-CM | POA: Insufficient documentation

## 2016-08-27 DIAGNOSIS — Y929 Unspecified place or not applicable: Secondary | ICD-10-CM | POA: Insufficient documentation

## 2016-08-27 DIAGNOSIS — S61012A Laceration without foreign body of left thumb without damage to nail, initial encounter: Secondary | ICD-10-CM

## 2016-08-27 DIAGNOSIS — Z79899 Other long term (current) drug therapy: Secondary | ICD-10-CM | POA: Insufficient documentation

## 2016-08-27 DIAGNOSIS — F17210 Nicotine dependence, cigarettes, uncomplicated: Secondary | ICD-10-CM | POA: Insufficient documentation

## 2016-08-27 DIAGNOSIS — E079 Disorder of thyroid, unspecified: Secondary | ICD-10-CM | POA: Insufficient documentation

## 2016-08-27 DIAGNOSIS — S61022A Laceration with foreign body of left thumb without damage to nail, initial encounter: Secondary | ICD-10-CM | POA: Insufficient documentation

## 2016-08-27 DIAGNOSIS — Z7982 Long term (current) use of aspirin: Secondary | ICD-10-CM | POA: Insufficient documentation

## 2016-08-27 DIAGNOSIS — Z7901 Long term (current) use of anticoagulants: Secondary | ICD-10-CM | POA: Insufficient documentation

## 2016-08-27 DIAGNOSIS — Y93G1 Activity, food preparation and clean up: Secondary | ICD-10-CM | POA: Insufficient documentation

## 2016-08-27 MED ORDER — LIDOCAINE HCL (PF) 1 % IJ SOLN
INTRAMUSCULAR | Status: AC
  Administered 2016-08-27: 5 mL
  Filled 2016-08-27: qty 5

## 2016-08-27 NOTE — ED Provider Notes (Signed)
[facility]-EMERGENCY DEPT Provider Note   CSN: [id number] Arrival date & time: 08/27/16  1924     History   Chief Complaint Chief Complaint  Patient presents with  . Laceration     L thumb    HPI Christian NuttingMichael L King is a 46 y.o. male.  The history is provided by the patient and the spouse.  Laceration   The incident occurred less than 1 hour ago. The laceration is located on the left hand. The laceration is 1 cm in size. The laceration mechanism was a a clean knife (he was cutting onions for supper). The pain is at a severity of 2/10. The pain is mild. The pain has been constant since onset. He reports no foreign bodies present. His tetanus status is UTD.   Pt has persistent bleeding despite holding pressure for 15 minutes.  He is on Xaralto due to dvt.   Past Medical History:  Diagnosis Date  . Alcohol abuse   . B12 deficiency   . Blood dyscrasia    hemachromatosis  . Bronchitis   . Degenerative joint disease (DJD) of hip   . GERD (gastroesophageal reflux disease)   . Gout   . History of kidney stones   . HLD (hyperlipidemia)   . HTN (hypertension)   . Hypothyroidism   . Ventricular tachycardia (HCC)    in setting of hypokalemia/ hypomagnesemia and ETOH  . Wears glasses     Patient Active Problem List   Diagnosis Date Noted  . DVT (deep venous thrombosis) (HCC) 05/15/2016  . Hypokalemia 03/17/2016  . 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  . QT prolongation 03/20/2015  . BMI 25.0-25.9,adult 01/28/2015  . Insomnia 02/05/2014  . Hemochromatosis 10/03/2013  . Alcohol abuse 07/24/2010  . Hypothyroidism 04/26/2010  . B12 DEFICIENCY 04/26/2010  . Hyperlipidemia 04/26/2010  . Gout 04/26/2010  . Essential hypertension 04/26/2010  . h/o VENTRICULAR TACHYCARDIA 04/26/2010    Past Surgical History:  Procedure Laterality Date  . TOTAL HIP ARTHROPLASTY Right   . TOTAL HIP ARTHROPLASTY Left 03/16/2016   Procedure: TOTAL HIP ARTHROPLASTY;  Surgeon: Frederico Hammananiel  Caffrey, MD;  [facility];  Service: Orthopedics;  Laterality: Left;  . vascularized fibular graft     right hip for avascular necrosis       Home Medications    Prior to Admission medications   Medication Sig Start Date End Date Taking? Authorizing Provider  aspirin 325 MG tablet Take 325 mg by mouth daily.    [provider]  fenofibrate 54 MG tablet Take 1 tablet by mouth  daily Patient taking differently: Take 54 mg by mouth daily.  01/13/16   Daphine DeutscherMartin, Mary-Margaret, FNP  LORazepam (ATIVAN) 2 MG tablet TAKE 1/2 (ONE-HALF) TABLET BY MOUTH ONCE DAILY AS NEEDED 07/26/16   Daphine DeutscherMartin, Mary-Margaret, FNP  magnesium oxide (MAG-OX) 400 MG tablet Take 1 tablet (400 mg total) by mouth daily. Patient taking differently: Take 400 mg by mouth daily with lunch.  01/28/15   Daphine DeutscherMartin, Mary-Margaret, FNP  metoprolol succinate (TOPROL-XL) 50 MG 24 hr tablet TAKE 1 TABLET BY MOUTH  DAILY WITH/ OR IMMEDIATELY  FOLLOWING A MEAL 04/12/16   Daphine DeutscherMartin, Mary-Margaret, FNP  oxyCODONE (OXY IR/ROXICODONE) 5 MG immediate release tablet Take 1 tablet (5 mg total) by mouth every 6 (six) hours as needed for moderate pain. Patient not taking: Reported on 06/06/2016 05/17/16   Dara Lordshyne, Samantha J, PA-C  potassium chloride (K-DUR,KLOR-CON) 10 MEQ tablet Take 1 tablet by mouth  every day 08/21/16  Hillis Range, MD  rivaroxaban (XARELTO) 20 MG TABS tablet Take 1 tablet (20 mg total) by mouth daily. 06/07/16   Rhyne, Ames Coupe, PA-C  rivaroxaban (XARELTO) 20 MG TABS tablet Take 1 tablet (20 mg total) by mouth daily with supper. 05/29/16   Daphine Deutscher Mary-Margaret, FNP  Rivaroxaban 15 & 20 MG TBPK Take as directed on package: Start with one 15mg  tablet by mouth twice a day with food. On Day 22, switch to one 20mg  tablet once a day with food. 05/17/16   Rhyne, Ames Coupe, PA-C  sildenafil (REVATIO) 20 MG tablet 2-5 prn sex Patient taking differently: Take 40-100 mg by mouth daily as needed (prior to sexual intercourse).  10/05/15    Mechele Claude, MD  simvastatin (ZOCOR) 40 MG tablet Take 1 tablet (40 mg total) by mouth daily. Patient taking differently: Take 40 mg by mouth once a week.  01/13/16   Bennie Pierini, FNP    Family History Family History  Problem Relation Age of Onset  . Hypertension Father   . Diabetes Maternal Grandmother   . Hypertension Paternal Grandmother   . Stroke Paternal Grandmother     Social History Social History  Substance Use Topics  . Smoking status: Current Every Day Smoker    Packs/day: 0.25    Types: Cigarettes    Start date: 05/05/1985  . Smokeless tobacco: Never Used  . Alcohol use Yes     Comment: daily     Allergies   Azithromycin; Hctz [hydrochlorothiazide]; and Tramadol   Review of Systems Review of Systems  Constitutional: Negative for chills and fever.  Respiratory: Negative for shortness of breath and wheezing.   Skin: Positive for wound.  Neurological: Negative for numbness.     Physical Exam Updated Vital Signs BP 119/79 (BP Location: Left Arm)   Pulse 76   Temp 97.8 F (36.6 C) (Oral)   Resp 18   Ht 5\' 7"  (1.702 m)   Wt 73.9 kg (163 lb)   SpO2 100%   BMI 25.53 kg/m   Physical Exam  Constitutional: He is oriented to person, place, and time. He appears well-developed and well-nourished.  HENT:  Head: Normocephalic.  Cardiovascular: Normal rate.   Pulmonary/Chest: Effort normal.  Musculoskeletal: He exhibits tenderness.  Neurological: He is alert and oriented to person, place, and time. No sensory deficit.  Skin: Laceration noted.  1 cm flap laceration distal left thumb, lateral to nail without nail involvement.  Persistent venous bleeding despite pressure application.     ED Treatments / Results  Labs (all labs ordered are listed, but only abnormal results are displayed) Labs Reviewed - No data to display  EKG  EKG Interpretation None       Radiology No results found.  Procedures Procedures (including critical care  time)  LACERATION REPAIR Performed by: Burgess Amor Authorized by: Burgess Amor Consent: Verbal consent obtained. Risks and benefits: risks, benefits and alternatives were discussed Consent given by: patient Patient identity confirmed: provided demographic data Prepped and Draped in normal sterile fashion Wound explored  Laceration Location: left thumb  Laceration Length: 1cm  No Foreign Bodies seen or palpated  Anesthesia: local infiltration  Local anesthetic: lidocaine 2% without epinephrine  Anesthetic total: 4 ml  Irrigation method: syringe Amount of cleaning: standard  Skin closure: ethilon 4-0  Number of sutures: 3  Technique: simple interrupted  Patient tolerance: Patient tolerated the procedure well with no immediate complications.  Finger cot was used to control bleeding, in place for 10 minutes  while suturing.  Slow ooze from wound after removed. Quick clot applied and wound was hemostatic at dc.   Medications Ordered in ED Medications  lidocaine (PF) (XYLOCAINE) 1 % injection (5 mLs  Given 08/27/16 2020)     Initial Impression / Assessment and Plan / ED Course  I have reviewed the triage vital signs and the nursing notes.  Pertinent labs & imaging results that were available during my care of the patient were reviewed by me and considered in my medical decision making (see chart for details).     Wound care instructions given.  Pt advised to have sutures removed in 10 days,  Return here sooner for any signs of infection including redness, swelling, worse pain or drainage of pus.     Final Clinical Impressions(s) / ED Diagnoses   Final diagnoses:  Laceration of left thumb without foreign body without damage to nail, initial encounter    New Prescriptions New Prescriptions   No medications on file     Victoriano Lain 08/27/16 2108    Donnetta Hutching, MD 08/29/16 1710

## 2016-08-27 NOTE — ED Notes (Signed)
Applied pressure dressing after suturing.  Pt is elevating hand also at this time bleeding appears controlled

## 2016-08-27 NOTE — ED Triage Notes (Addendum)
Pt reports cutting an onion and cut the pad of his L thumb, he has drank a half of a pint and several shots of tequila. Pt is on Xarelto. Pt has bandage on at this time and has bleeding noted on the bandage. Bandage removed and small oozing noted to wound, new bandage applied.

## 2016-08-27 NOTE — Discharge Instructions (Signed)
Have your sutures removed in 10 days.  Keep your wound clean and dry.  Keep a bulky dressing on it to help prevent bumping it open again.  You may remove the quick clot pad from the wound tomorrow.   Get rechecked for any sign of infection (redness,  swelling, increased pain or drainage of purulent fluid).

## 2016-09-10 ENCOUNTER — Encounter: Payer: Self-pay | Admitting: *Deleted

## 2016-09-10 ENCOUNTER — Encounter: Payer: Self-pay | Admitting: Nurse Practitioner

## 2016-09-10 ENCOUNTER — Ambulatory Visit (INDEPENDENT_AMBULATORY_CARE_PROVIDER_SITE_OTHER): Payer: Self-pay | Admitting: Nurse Practitioner

## 2016-09-10 VITALS — BP 116/84 | HR 76 | Temp 96.8°F | Ht 67.0 in | Wt 167.0 lb

## 2016-09-10 DIAGNOSIS — H00015 Hordeolum externum left lower eyelid: Secondary | ICD-10-CM

## 2016-09-10 DIAGNOSIS — Z4802 Encounter for removal of sutures: Secondary | ICD-10-CM

## 2016-09-10 MED ORDER — ERYTHROMYCIN 5 MG/GM OP OINT: 1.0000 "application " | TOPICAL_OINTMENT | Freq: Every day | OPHTHALMIC | 0 refills | Status: DC

## 2016-09-10 NOTE — Progress Notes (Signed)
   Subjective:    Patient ID: Christian King, male    DOB: [birth year], 46 y.o.   MRN: [id number]  HPI  Patient went to the emergency room on 7*9/18 with laceration to left thumb. He is here today for stitch removal. Denies any pain from wound and no discharge.  * has lump on left lower lid that is sore  Review of Systems  Constitutional: Negative.   Respiratory: Negative.   Cardiovascular: Negative.   Neurological: Negative.   Psychiatric/Behavioral: Negative.   All other systems reviewed and are negative.      Objective:   Physical Exam  Constitutional: He is oriented to person, place, and time. He appears well-developed and well-nourished. No distress.  Cardiovascular: Normal rate and regular rhythm.   Pulmonary/Chest: Effort normal and breath sounds normal.  Neurological: He is alert and oriented to person, place, and time.  Skin: Skin is warm and dry.  Wound edges well approximated- no drainage and no erythema  Psychiatric: He has a normal mood and affect. His behavior is normal. Judgment and thought content normal.    BP 116/84   Pulse 76   Temp (!) 96.8 F (36 C) (Oral)   Ht 5\' 7"  (1.702 m)   Wt 167 lb (75.8 kg)   BMI 26.16 kg/m         Assessment & Plan:   1. Visit for suture removal    Continue to keep clean dry Steri stirp will fall off on their own RTO prn  2 stye left lower lid Meds ordered this encounter  Medications  . erythromycin [facility](ROMYCIN) ophthalmic ointment    Sig: Place 1 application into the left eye at bedtime.    Dispense:  3.5 g    Refill:  0    Order Specific Question:   Supervising Provider    Answer:   Johna SheriffVINCENT, CAROL L [[id number]]     Mary-Margaret Daphine DeutscherMartin, FNP

## 2016-09-10 NOTE — Patient Instructions (Signed)

## 2016-09-24 ENCOUNTER — Other Ambulatory Visit: Payer: Self-pay | Admitting: Nurse Practitioner

## 2016-09-24 NOTE — Telephone Encounter (Signed)
Please call in lorazepam with 1 refills 

## 2016-09-24 NOTE — Telephone Encounter (Signed)
Phoned in.

## 2016-09-24 NOTE — Telephone Encounter (Signed)
Last seen 09/10/16  MMM   If approved [facility]

## 2016-10-04 ENCOUNTER — Other Ambulatory Visit: Payer: Self-pay | Admitting: *Deleted

## 2016-10-04 DIAGNOSIS — I10 Essential (primary) hypertension: Secondary | ICD-10-CM

## 2016-10-04 MED ORDER — METOPROLOL SUCCINATE ER 50 MG PO TB24: ORAL_TABLET | ORAL | 0 refills | Status: DC

## 2016-10-11 ENCOUNTER — Encounter (HOSPITAL_COMMUNITY): Payer: Self-pay | Admitting: Orthopedic Surgery

## 2016-10-11 NOTE — Addendum Note (Signed)
Addendum  created 10/11/16 1020 by Ziyana Morikawa, MD   Sign clinical note    

## 2016-11-08 ENCOUNTER — Encounter: Payer: Self-pay | Admitting: Nurse Practitioner

## 2016-11-08 ENCOUNTER — Ambulatory Visit (INDEPENDENT_AMBULATORY_CARE_PROVIDER_SITE_OTHER): Payer: Self-pay | Admitting: Nurse Practitioner

## 2016-11-08 VITALS — BP 112/79 | HR 84 | Temp 97.6°F | Ht 67.0 in | Wt 170.0 lb

## 2016-11-08 DIAGNOSIS — E785 Hyperlipidemia, unspecified: Secondary | ICD-10-CM

## 2016-11-08 DIAGNOSIS — E876 Hypokalemia: Secondary | ICD-10-CM

## 2016-11-08 DIAGNOSIS — Z6825 Body mass index (BMI) 25.0-25.9, adult: Secondary | ICD-10-CM

## 2016-11-08 DIAGNOSIS — M1A9XX0 Chronic gout, unspecified, without tophus (tophi): Secondary | ICD-10-CM

## 2016-11-08 DIAGNOSIS — I10 Essential (primary) hypertension: Secondary | ICD-10-CM

## 2016-11-08 DIAGNOSIS — E039 Hypothyroidism, unspecified: Secondary | ICD-10-CM

## 2016-11-08 DIAGNOSIS — G4700 Insomnia, unspecified: Secondary | ICD-10-CM

## 2016-11-08 DIAGNOSIS — M1612 Unilateral primary osteoarthritis, left hip: Secondary | ICD-10-CM

## 2016-11-08 MED ORDER — LORAZEPAM 2 MG PO TABS: ORAL_TABLET | ORAL | 1 refills | Status: DC

## 2016-11-08 MED ORDER — SIMVASTATIN 40 MG PO TABS: 40.0000 mg | ORAL_TABLET | Freq: Every day | ORAL | 1 refills | Status: DC

## 2016-11-08 MED ORDER — FENOFIBRATE 54 MG PO TABS: ORAL_TABLET | ORAL | 1 refills | Status: DC

## 2016-11-08 MED ORDER — POTASSIUM CHLORIDE CRYS ER 10 MEQ PO TBCR: EXTENDED_RELEASE_TABLET | ORAL | 1 refills | Status: DC

## 2016-11-08 NOTE — Patient Instructions (Signed)
Stress and Stress Management Stress is a normal reaction to life events. It is what you feel when life demands more than you are used to or more than you can handle. Some stress can be useful. For example, the stress reaction can help you catch the last bus of the day, study for a test, or meet a deadline at work. But stress that occurs too often or for too long can cause problems. It can affect your emotional health and interfere with relationships and normal daily activities. Too much stress can weaken your immune system and increase your risk for physical illness. If you already have a medical problem, stress can make it worse. What are the causes? All sorts of life events may cause stress. An event that causes stress for one person may not be stressful for another person. Major life events commonly cause stress. These may be positive or negative. Examples include losing your job, moving into a new home, getting married, having a baby, or losing a loved one. Less obvious life events may also cause stress, especially if they occur day after day or in combination. Examples include working long hours, driving in traffic, caring for children, being in debt, or being in a difficult relationship. What are the signs or symptoms? Stress may cause emotional symptoms including, the following:  Anxiety. This is feeling worried, afraid, on edge, overwhelmed, or out of control.  Anger. This is feeling irritated or impatient.  Depression. This is feeling sad, down, helpless, or guilty.  Difficulty focusing, remembering, or making decisions.  Stress may cause physical symptoms, including the following:  Aches and pains. These may affect your head, neck, back, stomach, or other areas of your body.  Tight muscles or clenched jaw.  Low energy or trouble sleeping.  Stress may cause unhealthy behaviors, including the following:  Eating to feel better (overeating) or skipping meals.  Sleeping too little,  too much, or both.  Working too much or putting off tasks (procrastination).  Smoking, drinking alcohol, or using drugs to feel better.  How is this diagnosed? Stress is diagnosed through an assessment by your health care provider. Your health care provider will ask questions about your symptoms and any stressful life events.Your health care provider will also ask about your medical history and may order blood tests or other tests. Certain medical conditions and medicine can cause physical symptoms similar to stress. Mental illness can cause emotional symptoms and unhealthy behaviors similar to stress. Your health care provider may refer you to a mental health professional for further evaluation. How is this treated? Stress management is the recommended treatment for stress.The goals of stress management are reducing stressful life events and coping with stress in healthy ways. Techniques for reducing stressful life events include the following:  Stress identification. Self-monitor for stress and identify what causes stress for you. These skills may help you to avoid some stressful events.  Time management. Set your priorities, keep a calendar of events, and learn to say "no." These tools can help you avoid making too many commitments.  Techniques for coping with stress include the following:  Rethinking the problem. Try to think realistically about stressful events rather than ignoring them or overreacting. Try to find the positives in a stressful situation rather than focusing on the negatives.  Exercise. Physical exercise can release both physical and emotional tension. The key is to find a form of exercise you enjoy and do it regularly.  Relaxation techniques. These relax the body and  mind. Examples include yoga, meditation, tai chi, biofeedback, deep breathing, progressive muscle relaxation, listening to music, being out in nature, journaling, and other hobbies. Again, the key is to find  one or more that you enjoy and can do regularly.  Healthy lifestyle. Eat a balanced diet, get plenty of sleep, and do not smoke. Avoid using alcohol or drugs to relax.  Strong support network. Spend time with family, friends, or other people you enjoy being around.Express your feelings and talk things over with someone you trust.  Counseling or talktherapy with a mental health professional may be helpful if you are having difficulty managing stress on your own. Medicine is typically not recommended for the treatment of stress.Talk to your health care provider if you think you need medicine for symptoms of stress. Follow these instructions at home:  Keep all follow-up visits as directed by your health care provider.  Take all medicines as directed by your health care provider. Contact a health care provider if:  Your symptoms get worse or you start having new symptoms.  You feel overwhelmed by your problems and can no longer manage them on your own. Get help right away if:  You feel like hurting yourself or someone else. This information is not intended to replace advice given to you by your health care provider. Make sure you discuss any questions you have with your health care provider. Document Released: 08/01/2000 Document Revised: 07/14/2015 Document Reviewed: 09/30/2012 Elsevier Interactive Patient Education  2017 Elsevier Inc.  

## 2016-11-08 NOTE — Progress Notes (Signed)
Subjective:    Patient ID: Christian King, male    DOB: [birth year], 46 y.o.   MRN: [id number]  HPI   Christian King is here today for follow up of chronic medical problem.  Outpatient Encounter Prescriptions as of 11/08/2016  Medication Sig  . aspirin 325 MG tablet Take 325 mg by mouth daily.  Marland Kitchen erythromycin [facility]) ophthalmic ointment Place 1 application into the left eye at bedtime.  . fenofibrate 54 MG tablet Take 1 tablet by mouth  daily (Patient taking differently: Take 54 mg by mouth daily. )  . LORazepam (ATIVAN) 2 MG tablet TAKE 1/2 (ONE-HALF) TABLET BY MOUTH ONCE DAILY AS NEEDED  . magnesium oxide (MAG-OX) 400 MG tablet Take 1 tablet (400 mg total) by mouth daily. (Patient taking differently: Take 400 mg by mouth daily with lunch. )  . metoprolol succinate (TOPROL-XL) 50 MG 24 hr tablet TAKE 1 TABLET BY MOUTH  DAILY WITH/ OR IMMEDIATELY  FOLLOWING A MEAL  . oxyCODONE (OXY IR/ROXICODONE) 5 MG immediate release tablet Take 1 tablet (5 mg total) by mouth every 6 (six) hours as needed for moderate pain.  . potassium chloride (K-DUR,KLOR-CON) 10 MEQ tablet Take 1 tablet by mouth  every day  . rivaroxaban (XARELTO) 20 MG TABS tablet Take 1 tablet (20 mg total) by mouth daily with supper.  . sildenafil (REVATIO) 20 MG tablet 2-5 prn sex (Patient taking differently: Take 40-100 mg by mouth daily as needed (prior to sexual intercourse). )  . simvastatin (ZOCOR) 40 MG tablet Take 1 tablet (40 mg total) by mouth daily. (Patient taking differently: Take 40 mg by mouth once a week. )   No facility-administered encounter medications on file as of 11/08/2016.     1. Essential hypertension  No c/o chest pain, sob or headache. Does not check blood pressure at home  2. Hypothyroidism, unspecified type   no issues he is aware of  3. Osteoarthritis of left hip, unspecified osteoarthritis type  Has pain often- use to take pain meds  4. Insomnia, unspecified type  Uses ativan at night to  sleep as needed  5. Hypokalemia  No c/o cramping  6. Hyperlipidemia, unspecified hyperlipidemia type  Does not watch diet at all  7. Hereditary hemochromatosis ([place])  Has not had ferritin level checked recently  8. Chronic gout without tophus, unspecified cause, unspecified site  No recent flare ups  9. 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  HENT: Negative.   Eyes: Negative for pain.  Respiratory: Negative for shortness of breath.   Cardiovascular: Negative for chest pain, palpitations and leg swelling.  Gastrointestinal: Negative for abdominal pain.  Endocrine: Negative for polydipsia.  Genitourinary: Negative.   Skin: Negative for rash.  Neurological: Negative for dizziness, weakness and headaches.  Hematological: Does not bruise/bleed easily.  Psychiatric/Behavioral: Negative.   All other systems reviewed and are negative.      Objective:   Physical Exam  Constitutional: He is oriented to person, place, and time. He appears well-developed and well-nourished.  HENT:  Head: Normocephalic.  Right Ear: External ear normal.  Left Ear: External ear normal.  Nose: Nose normal.  Mouth/Throat: Oropharynx is clear and moist.  Eyes: Pupils are equal, round, and reactive to light. EOM are normal.  Neck: Normal range of motion. Neck supple.  No JVD present. No thyromegaly present.  Cardiovascular: Normal rate, regular rhythm, normal heart sounds and intact distal pulses.  Exam reveals no gallop and no friction rub.   No murmur heard. Pulmonary/Chest: Effort normal and breath sounds normal. No respiratory distress. He has no wheezes. He has no rales. He exhibits no tenderness.  Abdominal: Soft. Bowel sounds are normal. He exhibits no mass. There is no tenderness.  Genitourinary:  Prostate normal and penis normal.  Musculoskeletal: Normal range of motion. He exhibits no edema.  Lymphadenopathy:    He has no cervical adenopathy.  Neurological: He is alert and oriented to person, place, and time. No cranial nerve deficit.  Skin: Skin is warm and dry.  Psychiatric: He has a normal mood and affect. His behavior is normal. Judgment and thought content normal.    BP 112/79   Pulse 84   Temp 97.6 F (36.4 C) (Oral)   Ht _0  (1.702 m)   Wt 170 lb (77.1 kg)   BMI 26.63 kg/m        Assessment & Plan:  1. Essential hypertension Low sodium diet - CMP14+EGFR  2. Hypothyroidism, unspecified type  3. Osteoarthritis of left hip, unspecified osteoarthritis type Keep follow up with ortho  4. Insomnia, unspecified type bedtime routine - LORazepam (ATIVAN) 2 MG tablet; TAKE 1/2 (ONE-HALF) TABLET BY MOUTH ONCE DAILY AS NEEDED  Dispense: 15 tablet; Refill: 1  5. Hypokalemia - potassium chloride (K-DUR,KLOR-CON) 10 MEQ tablet; Take 1 tablet by mouth  every day  Dispense: 90 tablet; Refill: 1  6. Hyperlipidemia, unspecified hyperlipidemia type Low fat diet - Lipid panel - simvastatin (ZOCOR) 40 MG tablet; Take 1 tablet (40 mg total) by mouth daily.  Dispense: 90 tablet; Refill: 1 - fenofibrate 54 MG tablet; Take 1 tablet by mouth  daily  Dispense: 90 tablet; Refill: 1  7. Hereditary hemochromatosis ([place]) Labs pending - Anemia Profile B  8. Chronic gout without tophus, unspecified cause, unspecified site Avoid alcohol- causes flare ups  9. BMI 25.0-25.9,adult Discussed diet and exercise for person with BMI >25 Will recheck weight in 3-6 months    Labs pending Health maintenance reviewed Diet and exercise encouraged Continue all meds Follow up  In 6 months   [place], FNP

## 2016-11-09 LAB — ANEMIA PROFILE B
BASOS ABS: 0.1 10*3/uL (ref 0.0–0.2)
Basos: 1 %
EOS (ABSOLUTE): 0.1 10*3/uL (ref 0.0–0.4)
Eos: 1 %
FOLATE: 2.2 ng/mL — AB (ref >3.0)
Ferritin: 173 ng/mL (ref 30–400)
Hematocrit: 41.8 % (ref 37.5–51.0)
Hemoglobin: 14 g/dL (ref 13.0–17.7)
IMMATURE GRANULOCYTES: 0 %
IRON: 120 ug/dL (ref 38–169)
Immature Grans (Abs): 0 10*3/uL (ref 0.0–0.1)
Iron Saturation: 32 % (ref 15–55)
LYMPHS ABS: 1.7 10*3/uL (ref 0.7–3.1)
Lymphs: 21 %
MCH: 36 pg — AB (ref 26.6–33.0)
MCHC: 33.5 g/dL (ref 31.5–35.7)
MCV: 108 fL — AB (ref 79–97)
MONOS ABS: 0.8 10*3/uL (ref 0.1–0.9)
Monocytes: 10 %
NEUTROS ABS: 5.2 10*3/uL (ref 1.4–7.0)
NEUTROS PCT: 67 %
PLATELETS: 290 10*3/uL (ref 150–379)
RBC: 3.89 x10E6/uL — AB (ref 4.14–5.80)
RDW: 14.3 % (ref 12.3–15.4)
Retic Ct Pct: 0.7 % (ref 0.6–2.6)
Total Iron Binding Capacity: 373 ug/dL (ref 250–450)
UIBC: 253 ug/dL (ref 111–343)
VITAMIN B 12: 209 pg/mL — AB (ref 232–1245)
WBC: 7.9 10*3/uL (ref 3.4–10.8)

## 2016-11-09 LAB — CMP14+EGFR
ALBUMIN: 4.3 g/dL (ref 3.5–5.5)
ALK PHOS: 89 IU/L (ref 39–117)
ALT: 13 IU/L (ref 0–44)
AST: 18 IU/L (ref 0–40)
Albumin/Globulin Ratio: 1.6 (ref 1.2–2.2)
BILIRUBIN TOTAL: 0.3 mg/dL (ref 0.0–1.2)
BUN / CREAT RATIO: 9 (ref 9–20)
BUN: 9 mg/dL (ref 6–24)
CO2: 22 mmol/L (ref 20–29)
Calcium: 8.9 mg/dL (ref 8.7–10.2)
Chloride: 100 mmol/L (ref 96–106)
Creatinine, Ser: 0.95 mg/dL (ref 0.76–1.27)
GFR calc Af Amer: 109 mL/min/{1.73_m2} (ref >59)
GFR calc non Af Amer: 94 mL/min/{1.73_m2} (ref >59)
GLUCOSE: 88 mg/dL (ref 65–99)
Globulin, Total: 2.7 g/dL (ref 1.5–4.5)
Potassium: 3.9 mmol/L (ref 3.5–5.2)
Sodium: 135 mmol/L (ref 134–144)
Total Protein: 7 g/dL (ref 6.0–8.5)

## 2016-11-09 LAB — LIPID PANEL
CHOLESTEROL TOTAL: 144 mg/dL (ref 100–199)
Chol/HDL Ratio: 3.1 ratio (ref 0.0–5.0)
HDL: 46 mg/dL (ref >39)
LDL Calculated: 85 mg/dL (ref 0–99)
TRIGLYCERIDES: 63 mg/dL (ref 0–149)
VLDL CHOLESTEROL CAL: 13 mg/dL (ref 5–40)

## 2016-11-26 ENCOUNTER — Other Ambulatory Visit: Payer: Self-pay | Admitting: Physician Assistant

## 2016-11-26 DIAGNOSIS — G4700 Insomnia, unspecified: Secondary | ICD-10-CM

## 2016-11-27 NOTE — Telephone Encounter (Signed)
[id number] 10/9

## 2016-11-27 NOTE — Telephone Encounter (Signed)
Last seen 11/08/16  MMM  IF approved route to nurse to call into [facility]

## 2016-11-27 NOTE — Telephone Encounter (Signed)
Patient states that he does have a refill on lorazepam

## 2016-11-27 NOTE — Telephone Encounter (Signed)
Should have a refill left. 

## 2016-12-03 ENCOUNTER — Ambulatory Visit: Payer: Self-pay | Admitting: Surgery

## 2016-12-03 ENCOUNTER — Encounter (HOSPITAL_COMMUNITY): Payer: Self-pay

## 2016-12-24 ENCOUNTER — Ambulatory Visit: Payer: Self-pay | Admitting: Surgery

## 2016-12-24 ENCOUNTER — Encounter (HOSPITAL_COMMUNITY): Payer: Self-pay

## 2017-01-24 ENCOUNTER — Telehealth: Payer: Self-pay | Admitting: Nurse Practitioner

## 2017-01-24 DIAGNOSIS — E785 Hyperlipidemia, unspecified: Secondary | ICD-10-CM

## 2017-01-24 MED ORDER — FENOFIBRATE 54 MG PO TABS: ORAL_TABLET | ORAL | 0 refills | Status: DC

## 2017-01-24 NOTE — Telephone Encounter (Signed)
RX sent to wrong pharmacy per pt [facility] Okayed per MMM

## 2017-01-27 ENCOUNTER — Other Ambulatory Visit: Payer: Self-pay | Admitting: Nurse Practitioner

## 2017-01-27 DIAGNOSIS — I10 Essential (primary) hypertension: Secondary | ICD-10-CM

## 2017-01-28 ENCOUNTER — Other Ambulatory Visit: Payer: Self-pay | Admitting: Nurse Practitioner

## 2017-01-28 DIAGNOSIS — G4700 Insomnia, unspecified: Secondary | ICD-10-CM

## 2017-01-31 ENCOUNTER — Other Ambulatory Visit: Payer: Self-pay | Admitting: Nurse Practitioner

## 2017-01-31 ENCOUNTER — Other Ambulatory Visit: Payer: Self-pay | Admitting: *Deleted

## 2017-01-31 DIAGNOSIS — G4700 Insomnia, unspecified: Secondary | ICD-10-CM

## 2017-01-31 MED ORDER — LORAZEPAM 2 MG PO TABS: ORAL_TABLET | ORAL | 0 refills | Status: DC

## 2017-01-31 NOTE — Telephone Encounter (Signed)
Please call in loreazapam with 0 refills

## 2017-01-31 NOTE — Telephone Encounter (Signed)
Rx called to pharmacy

## 2017-03-01 ENCOUNTER — Other Ambulatory Visit: Payer: Self-pay | Admitting: Nurse Practitioner

## 2017-03-01 DIAGNOSIS — G4700 Insomnia, unspecified: Secondary | ICD-10-CM

## 2017-03-25 ENCOUNTER — Other Ambulatory Visit: Payer: Self-pay | Admitting: Family Medicine

## 2017-04-03 ENCOUNTER — Other Ambulatory Visit: Payer: Self-pay | Admitting: Nurse Practitioner

## 2017-04-03 DIAGNOSIS — G4700 Insomnia, unspecified: Secondary | ICD-10-CM

## 2017-04-03 NOTE — Telephone Encounter (Signed)
Last refill without being seen 

## 2017-04-03 NOTE — Telephone Encounter (Signed)
Last seen 10/2016  MMM 

## 2017-04-05 ENCOUNTER — Ambulatory Visit: Payer: Self-pay | Admitting: Nurse Practitioner

## 2017-04-09 ENCOUNTER — Encounter: Payer: Self-pay | Admitting: Nurse Practitioner

## 2017-04-09 ENCOUNTER — Ambulatory Visit (INDEPENDENT_AMBULATORY_CARE_PROVIDER_SITE_OTHER): Payer: Commercial Managed Care - PPO | Admitting: Nurse Practitioner

## 2017-04-09 VITALS — BP 116/76 | HR 74 | Temp 97.5°F | Ht 67.0 in | Wt 172.0 lb

## 2017-04-09 DIAGNOSIS — E785 Hyperlipidemia, unspecified: Secondary | ICD-10-CM | POA: Diagnosis not present

## 2017-04-09 DIAGNOSIS — E876 Hypokalemia: Secondary | ICD-10-CM

## 2017-04-09 DIAGNOSIS — Z6825 Body mass index (BMI) 25.0-25.9, adult: Secondary | ICD-10-CM

## 2017-04-09 DIAGNOSIS — E538 Deficiency of other specified B group vitamins: Secondary | ICD-10-CM

## 2017-04-09 DIAGNOSIS — G4700 Insomnia, unspecified: Secondary | ICD-10-CM | POA: Diagnosis not present

## 2017-04-09 DIAGNOSIS — Z23 Encounter for immunization: Secondary | ICD-10-CM

## 2017-04-09 DIAGNOSIS — I10 Essential (primary) hypertension: Secondary | ICD-10-CM | POA: Diagnosis not present

## 2017-04-09 DIAGNOSIS — F1010 Alcohol abuse, uncomplicated: Secondary | ICD-10-CM | POA: Diagnosis not present

## 2017-04-09 DIAGNOSIS — E039 Hypothyroidism, unspecified: Secondary | ICD-10-CM | POA: Diagnosis not present

## 2017-04-09 DIAGNOSIS — M1A9XX0 Chronic gout, unspecified, without tophus (tophi): Secondary | ICD-10-CM

## 2017-04-09 MED ORDER — POTASSIUM CHLORIDE CRYS ER 10 MEQ PO TBCR: EXTENDED_RELEASE_TABLET | ORAL | 1 refills | Status: DC

## 2017-04-09 MED ORDER — SIMVASTATIN 40 MG PO TABS: 40.0000 mg | ORAL_TABLET | Freq: Every day | ORAL | 1 refills | Status: DC

## 2017-04-09 MED ORDER — MAGNESIUM OXIDE 400 MG PO TABS: 400.0000 mg | ORAL_TABLET | Freq: Every day | ORAL | 1 refills | Status: DC

## 2017-04-09 MED ORDER — FENOFIBRATE 54 MG PO TABS: ORAL_TABLET | ORAL | 0 refills | Status: DC

## 2017-04-09 MED ORDER — METOPROLOL SUCCINATE ER 50 MG PO TB24: ORAL_TABLET | ORAL | 0 refills | Status: DC

## 2017-04-09 NOTE — Patient Instructions (Signed)
Stress and Stress Management Stress is a normal reaction to life events. It is what you feel when life demands more than you are used to or more than you can handle. Some stress can be useful. For example, the stress reaction can help you catch the last bus of the day, study for a test, or meet a deadline at work. But stress that occurs too often or for too long can cause problems. It can affect your emotional health and interfere with relationships and normal daily activities. Too much stress can weaken your immune system and increase your risk for physical illness. If you already have a medical problem, stress can make it worse. What are the causes? All sorts of life events may cause stress. An event that causes stress for one person may not be stressful for another person. Major life events commonly cause stress. These may be positive or negative. Examples include losing your job, moving into a new home, getting married, having a baby, or losing a loved one. Less obvious life events may also cause stress, especially if they occur day after day or in combination. Examples include working long hours, driving in traffic, caring for children, being in debt, or being in a difficult relationship. What are the signs or symptoms? Stress may cause emotional symptoms including, the following:  Anxiety. This is feeling worried, afraid, on edge, overwhelmed, or out of control.  Anger. This is feeling irritated or impatient.  Depression. This is feeling sad, down, helpless, or guilty.  Difficulty focusing, remembering, or making decisions.  Stress may cause physical symptoms, including the following:  Aches and pains. These may affect your head, neck, back, stomach, or other areas of your body.  Tight muscles or clenched jaw.  Low energy or trouble sleeping.  Stress may cause unhealthy behaviors, including the following:  Eating to feel better (overeating) or skipping meals.  Sleeping too little,  too much, or both.  Working too much or putting off tasks (procrastination).  Smoking, drinking alcohol, or using drugs to feel better.  How is this diagnosed? Stress is diagnosed through an assessment by your health care provider. Your health care provider will ask questions about your symptoms and any stressful life events.Your health care provider will also ask about your medical history and may order blood tests or other tests. Certain medical conditions and medicine can cause physical symptoms similar to stress. Mental illness can cause emotional symptoms and unhealthy behaviors similar to stress. Your health care provider may refer you to a mental health professional for further evaluation. How is this treated? Stress management is the recommended treatment for stress.The goals of stress management are reducing stressful life events and coping with stress in healthy ways. Techniques for reducing stressful life events include the following:  Stress identification. Self-monitor for stress and identify what causes stress for you. These skills may help you to avoid some stressful events.  Time management. Set your priorities, keep a calendar of events, and learn to say "no." These tools can help you avoid making too many commitments.  Techniques for coping with stress include the following:  Rethinking the problem. Try to think realistically about stressful events rather than ignoring them or overreacting. Try to find the positives in a stressful situation rather than focusing on the negatives.  Exercise. Physical exercise can release both physical and emotional tension. The key is to find a form of exercise you enjoy and do it regularly.  Relaxation techniques. These relax the body and  mind. Examples include yoga, meditation, tai chi, biofeedback, deep breathing, progressive muscle relaxation, listening to music, being out in nature, journaling, and other hobbies. Again, the key is to find  one or more that you enjoy and can do regularly.  Healthy lifestyle. Eat a balanced diet, get plenty of sleep, and do not smoke. Avoid using alcohol or drugs to relax.  Strong support network. Spend time with family, friends, or other people you enjoy being around.Express your feelings and talk things over with someone you trust.  Counseling or talktherapy with a mental health professional may be helpful if you are having difficulty managing stress on your own. Medicine is typically not recommended for the treatment of stress.Talk to your health care provider if you think you need medicine for symptoms of stress. Follow these instructions at home:  Keep all follow-up visits as directed by your health care provider.  Take all medicines as directed by your health care provider. Contact a health care provider if:  Your symptoms get worse or you start having new symptoms.  You feel overwhelmed by your problems and can no longer manage them on your own. Get help right away if:  You feel like hurting yourself or someone else. This information is not intended to replace advice given to you by your health care provider. Make sure you discuss any questions you have with your health care provider. Document Released: 08/01/2000 Document Revised: 07/14/2015 Document Reviewed: 09/30/2012 Elsevier Interactive Patient Education  2017 Elsevier Inc.  

## 2017-04-09 NOTE — Progress Notes (Signed)
Subjective:    Patient ID: Christian King, male    DOB: November 25, 1970, 47 y.o.   MRN: [id number]  HPI   Christian King is here today for follow up of chronic medical problem.  Outpatient Encounter Medications as of 04/09/2017  Medication Sig  . aspirin 325 MG tablet Take 325 mg by mouth daily.  Marland Kitchen erythromycin [facility]) ophthalmic ointment Place 1 application into the left eye at bedtime.  . fenofibrate 54 MG tablet Take 1 tablet by mouth  daily  . LORazepam (ATIVAN) 2 MG tablet TAKE 1/2 (ONE-HALF) TABLET BY MOUTH ONCE DAILY AS NEEDED  . magnesium oxide (MAG-OX) 400 MG tablet Take 1 tablet (400 mg total) by mouth daily. (Patient taking differently: Take 400 mg by mouth daily with lunch. )  . metoprolol succinate (TOPROL-XL) 50 MG 24 hr tablet TAKE 1 TABLET BY MOUTH DAILY WITH/ OR IMMEDIATELY FOLLOWING A MEAL  . potassium chloride (K-DUR,KLOR-CON) 10 MEQ tablet Take 1 tablet by mouth  every day  . sildenafil (REVATIO) 20 MG tablet TAKE TWO TO FIVE TABLETS BY MOUTH AS NEEDED FOR SEX  . simvastatin (ZOCOR) 40 MG tablet Take 1 tablet (40 mg total) by mouth daily.     1. Essential hypertension  No c/o chest pain, sob or headache. Does not check blood pressure at home. BP Readings from Last 3 Encounters:  04/09/17 116/76  11/08/16 112/79  09/10/16 116/84     2. Hypothyroidism, unspecified type  No problems that he is aware of  3. Alcohol abuse  Drinks beer everyday. Drinks at least 4 beers nightly and sometime smore  4. B12 deficiency  Is currently not on b12 injections- refuses injections  5. BMI 25.0-25.9,adult  No recent weight changes  6. Chronic gout without tophus, unspecified cause, unspecified site  No recent flare up  7. Hereditary hemochromatosis ([place])  Has not had any problems- he has not done anything about it  8. Hyperlipidemia, unspecified hyperlipidemia type  Does not watch diet  9. Hypokalemia  No problems  10. Insomnia, unspecified type  Does not take  anything    New complaints: None today  Social history: Just recently remarried   Review of Systems  Constitutional: Negative for activity change and appetite change.  HENT: Negative.   Eyes: Negative for pain.  Respiratory: Negative for shortness of breath.   Cardiovascular: Negative for chest pain, palpitations and leg swelling.  Gastrointestinal: Negative for abdominal pain.  Endocrine: Negative for polydipsia.  Genitourinary: Negative.   Skin: Negative for rash.  Neurological: Negative for dizziness, weakness and headaches.  Hematological: Does not bruise/bleed easily.  Psychiatric/Behavioral: Negative.   All other systems reviewed and are negative.      Objective:   Physical Exam  Constitutional: He is oriented to person, place, and time. He appears well-developed and well-nourished.  HENT:  Head: Normocephalic.  Right Ear: External ear normal.  Left Ear: External ear normal.  Nose: Nose normal.  Mouth/Throat: Oropharynx is clear and moist.  Eyes: EOM are normal. Pupils are equal, round, and reactive to light.  Neck: Normal range of motion. Neck supple. No JVD present. No thyromegaly present.  Cardiovascular: Normal rate, regular rhythm, normal heart sounds and intact distal pulses. Exam reveals no gallop and no friction rub.  No murmur heard. Pulmonary/Chest: Effort normal and breath sounds normal. No respiratory distress. He has no wheezes. He has no rales. He exhibits no tenderness.  Abdominal: Soft. Bowel sounds are normal. He exhibits no mass. There is  no tenderness.  Genitourinary: Prostate normal and penis normal.  Musculoskeletal: Normal range of motion. He exhibits no edema.  Lymphadenopathy:    He has no cervical adenopathy.  Neurological: He is alert and oriented to person, place, and time. No cranial nerve deficit.  Skin: Skin is warm and dry.  Psychiatric: He has a normal mood and affect. His behavior is normal. Judgment and thought content normal.    BP 116/76   Pulse 74   Temp (!) 97.5 F (36.4 C) (Oral)   Ht '5\' 7"'  (1.702 m)   Wt 172 lb (78 kg)   BMI 26.94 kg/m       Assessment & Plan:  1. Essential hypertension Low sodium diet - CMP14+EGFR - metoprolol succinate (TOPROL-XL) 50 MG 24 hr tablet; Take with or immediately following a meal.  Dispense: 90 tablet; Refill: 0  2. Hypothyroidism, unspecified type - Thyroid Panel With TSH  3. Alcohol abuse Encouraged to stop drinking  4. B12 deficiency  5. BMI 25.0-25.9,adult Discussed diet and exercise for person with BMI >25 Will recheck weight in 3-6 months  6. Chronic gout without tophus, unspecified cause, unspecified site Low purine diet  7. Hereditary hemochromatosis ([place]) Labs pending - Anemia Profile B  8. Hyperlipidemia, unspecified hyperlipidemia type Low fat diet - Lipid panel - fenofibrate 54 MG tablet; Take 1 tablet by mouth  daily  Dispense: 90 tablet; Refill: 0 - simvastatin (ZOCOR) 40 MG tablet; Take 1 tablet (40 mg total) by mouth daily.  Dispense: 90 tablet; Refill: 1  9. Hypokalemia - potassium chloride (K-DUR,KLOR-CON) 10 MEQ tablet; Take 1 tablet by mouth  every day  Dispense: 90 tablet; Refill: 1  10. Insomnia, unspecified type Bedtime routine    Labs pending Health maintenance reviewed Diet and exercise encouraged Continue all meds Follow up  In 6 months   [place], FNP

## 2017-04-10 LAB — THYROID PANEL WITH TSH
FREE THYROXINE INDEX: 1.5 (ref 1.2–4.9)
T3 Uptake Ratio: 23 % — ABNORMAL LOW (ref 24–39)
T4, Total: 6.5 ug/dL (ref 4.5–12.0)
TSH: 2.38 u[IU]/mL (ref 0.450–4.500)

## 2017-04-10 LAB — ANEMIA PROFILE B
BASOS ABS: 0.1 10*3/uL (ref 0.0–0.2)
Basos: 2 %
EOS (ABSOLUTE): 0.1 10*3/uL (ref 0.0–0.4)
Eos: 2 %
Ferritin: 349 ng/mL (ref 30–400)
Folate: 2 ng/mL — ABNORMAL LOW (ref >3.0)
Hematocrit: 44.9 % (ref 37.5–51.0)
Hemoglobin: 15.2 g/dL (ref 13.0–17.7)
Immature Grans (Abs): 0 10*3/uL (ref 0.0–0.1)
Immature Granulocytes: 1 %
Iron Saturation: 55 % (ref 15–55)
Iron: 201 ug/dL — ABNORMAL HIGH (ref 38–169)
LYMPHS ABS: 1.2 10*3/uL (ref 0.7–3.1)
Lymphs: 29 %
MCH: 36.6 pg — ABNORMAL HIGH (ref 26.6–33.0)
MCHC: 33.9 g/dL (ref 31.5–35.7)
MCV: 108 fL — AB (ref 79–97)
Monocytes Absolute: 0.7 10*3/uL (ref 0.1–0.9)
Monocytes: 17 %
NEUTROS ABS: 2 10*3/uL (ref 1.4–7.0)
Neutrophils: 49 %
PLATELETS: 188 10*3/uL (ref 150–379)
RBC: 4.15 x10E6/uL (ref 4.14–5.80)
RDW: 15.1 % (ref 12.3–15.4)
Retic Ct Pct: 1.5 % (ref 0.6–2.6)
Total Iron Binding Capacity: 364 ug/dL (ref 250–450)
UIBC: 163 ug/dL (ref 111–343)
VITAMIN B 12: 603 pg/mL (ref 232–1245)
WBC: 4.1 10*3/uL (ref 3.4–10.8)

## 2017-04-10 LAB — LIPID PANEL
CHOL/HDL RATIO: 2.3 ratio (ref 0.0–5.0)
Cholesterol, Total: 202 mg/dL — ABNORMAL HIGH (ref 100–199)
HDL: 86 mg/dL (ref >39)
LDL Calculated: 98 mg/dL (ref 0–99)
Triglycerides: 88 mg/dL (ref 0–149)
VLDL Cholesterol Cal: 18 mg/dL (ref 5–40)

## 2017-04-10 LAB — CMP14+EGFR
ALBUMIN: 4.5 g/dL (ref 3.5–5.5)
ALT: 72 IU/L — AB (ref 0–44)
AST: 123 IU/L — ABNORMAL HIGH (ref 0–40)
Albumin/Globulin Ratio: 1.6 (ref 1.2–2.2)
Alkaline Phosphatase: 77 IU/L (ref 39–117)
BUN / CREAT RATIO: 9 (ref 9–20)
BUN: 8 mg/dL (ref 6–24)
Bilirubin Total: 0.6 mg/dL (ref 0.0–1.2)
CALCIUM: 9.1 mg/dL (ref 8.7–10.2)
CO2: 21 mmol/L (ref 20–29)
Chloride: 98 mmol/L (ref 96–106)
Creatinine, Ser: 0.93 mg/dL (ref 0.76–1.27)
GFR, EST AFRICAN AMERICAN: 112 mL/min/{1.73_m2} (ref >59)
GFR, EST NON AFRICAN AMERICAN: 97 mL/min/{1.73_m2} (ref >59)
Globulin, Total: 2.8 g/dL (ref 1.5–4.5)
Glucose: 68 mg/dL (ref 65–99)
POTASSIUM: 4.2 mmol/L (ref 3.5–5.2)
Sodium: 142 mmol/L (ref 134–144)
TOTAL PROTEIN: 7.3 g/dL (ref 6.0–8.5)

## 2017-05-03 ENCOUNTER — Ambulatory Visit: Payer: Self-pay | Admitting: Nurse Practitioner

## 2017-05-09 ENCOUNTER — Other Ambulatory Visit: Payer: Self-pay | Admitting: Nurse Practitioner

## 2017-05-09 DIAGNOSIS — G4700 Insomnia, unspecified: Secondary | ICD-10-CM

## 2017-06-16 ENCOUNTER — Other Ambulatory Visit: Payer: Self-pay | Admitting: Nurse Practitioner

## 2017-06-16 DIAGNOSIS — G4700 Insomnia, unspecified: Secondary | ICD-10-CM

## 2017-07-09 ENCOUNTER — Other Ambulatory Visit: Payer: Self-pay | Admitting: Nurse Practitioner

## 2017-07-09 DIAGNOSIS — I10 Essential (primary) hypertension: Secondary | ICD-10-CM

## 2017-08-05 ENCOUNTER — Telehealth: Payer: Self-pay | Admitting: Nurse Practitioner

## 2017-11-07 ENCOUNTER — Telehealth: Payer: Self-pay | Admitting: Nurse Practitioner

## 2017-11-07 ENCOUNTER — Other Ambulatory Visit: Payer: Self-pay | Admitting: Nurse Practitioner

## 2017-11-07 DIAGNOSIS — I10 Essential (primary) hypertension: Secondary | ICD-10-CM

## 2017-11-22 NOTE — Telephone Encounter (Signed)
Patient calling again about appt needed to see MMM for med refills.  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  Needs labs done.  Please call.

## 2017-11-22 NOTE — Telephone Encounter (Signed)
Appt made.  Patient aware 12/09/17 at [facility]

## 2017-12-09 ENCOUNTER — Encounter: Payer: Commercial Managed Care - PPO | Admitting: Nurse Practitioner

## 2017-12-09 NOTE — Progress Notes (Signed)
Subjective:    Patient ID: Christian King, male    DOB: 1971-01-24, 47 y.o.   MRN: [id number] [facility] Chief Complaint: Medical Management of Chronic Issues  HPI:  1. Essential hypertension  -does not watch salt intake -does not check BP at home -no C/O HA/CP/SOB   2. Hereditary hemochromatosis ([facility])  -no issues he is aware of -does not see specialist   3. h/o VENTRICULAR TACHYCARDIA  -on Metoprolol -followed by Dr. Johney Frame (EP) last seen 02/2016 -no palpitations  4. QT prolongation  -no syncope -followed by Dr. Johney Frame  5. Hypothyroidism, unspecified type  -no issues he is aware of -no heat/cold intolerance  6. Hyperlipidemia, unspecified hyperlipidemia type  -does not watch fat intake in diet -no myalgias with statin  7. Chronic gout without tophus, unspecified cause, unspecified site  -no recent flares  8. Alcohol abuse  -drinks 4 beers daily -not interested in quitting -Alcohol Use Disorder-->  9. B12 deficiency  -does not take supplements or injections -no paresthesias  10. Hypokalemia  -no leg cramps  11. Insomnia, unspecified type  -sleeps -does not take anything for sleep  12. BMI 25.0-25.9,adult  -does not exercise -no recent weight changes    Outpatient Encounter Medications as of 12/09/2017  Medication Sig  . aspirin 325 MG tablet Take 325 mg by mouth daily.  Marland Kitchen erythromycin [facility]) ophthalmic ointment Place 1 application into the left eye at bedtime.  . fenofibrate 54 MG tablet Take 1 tablet by mouth  daily  . LORazepam (ATIVAN) 2 MG tablet TAKE 1/2 (ONE-HALF) TABLET BY MOUTH ONCE DAILY AS NEEDED  . magnesium oxide (MAG-OX) 400 MG tablet Take 1 tablet (400 mg total) by mouth daily.  . metoprolol succinate (TOPROL-XL) 50 MG 24 hr tablet TAKE 1 TABLET BY MOUTH WITH OR IMMEDIATELY FOLLOWING A MEAL.  Marland Kitchen potassium chloride (K-DUR,KLOR-CON) 10 MEQ tablet Take 1 tablet by mouth  every day  . sildenafil (REVATIO) 20 MG tablet TAKE TWO TO  FIVE TABLETS BY MOUTH AS NEEDED FOR SEX  . simvastatin (ZOCOR) 40 MG tablet Take 1 tablet (40 mg total) by mouth daily.   No facility-administered encounter medications on file as of 12/09/2017.       New complaints: None today  Social history: Recently remarried, truck driver    Review of Systems  Constitutional: Negative for activity change, appetite change, chills, fatigue, fever and unexpected weight change.  HENT: Negative for congestion, ear pain, rhinorrhea, sinus pressure, sinus pain and sore throat.   Eyes: Negative for pain, redness and visual disturbance.  Respiratory: Negative for cough, chest tightness, shortness of breath and wheezing.   Cardiovascular: Negative for chest pain, palpitations and leg swelling.  Gastrointestinal: Negative for abdominal pain, constipation, diarrhea, nausea and vomiting.  Endocrine: Negative for cold intolerance, heat intolerance, polydipsia, polyphagia and polyuria.  Genitourinary: Negative for difficulty urinating, dysuria and urgency.  Musculoskeletal: Negative for arthralgias, gait problem, joint swelling and myalgias.  Skin: Negative for rash and wound.  Allergic/Immunologic: Negative for environmental allergies and food allergies.  Neurological: Negative for dizziness, tremors, weakness and numbness.  Hematological: Does not bruise/bleed easily.  Psychiatric/Behavioral: Negative for behavioral problems, confusion, decreased concentration, sleep disturbance and suicidal ideas. The patient is not nervous/anxious.        Objective:   Physical Exam  Constitutional: He is oriented to person, place, and time. He appears well-developed and well-nourished.  HENT:  Head: Normocephalic and atraumatic.  Right Ear: External ear normal.  Left Ear: External ear  normal.  Nose: Nose normal.  Mouth/Throat: Oropharynx is clear and moist. No oropharyngeal exudate.  Eyes: Pupils are equal, round, and reactive to light. Conjunctivae and EOM are  normal.  Neck: Normal range of motion. Neck supple. No thyromegaly present.  Cardiovascular: Normal rate, regular rhythm, normal heart sounds and intact distal pulses.  Pulmonary/Chest: Effort normal and breath sounds normal.  Abdominal: Soft. Bowel sounds are normal.  Musculoskeletal: Normal range of motion.  Neurological: He is alert and oriented to person, place, and time. He displays normal reflexes. No cranial nerve deficit.  Skin: Skin is warm and dry.  Psychiatric: He has a normal mood and affect. His behavior is normal. Judgment and thought content normal.  Nursing note and vitals reviewed.      Assessment & Plan:

## 2017-12-11 ENCOUNTER — Encounter: Payer: Self-pay | Admitting: Nurse Practitioner

## 2018-06-03 IMAGING — CR DG CHEST 2V
2 series · 2 of 2 positions shown · non-contrast
Comparison: 03/20/2015.

CLINICAL DATA: Hip replacement.  Preoperative chest x-ray.

EXAM:
CHEST  2 VIEW

[w chest pa]
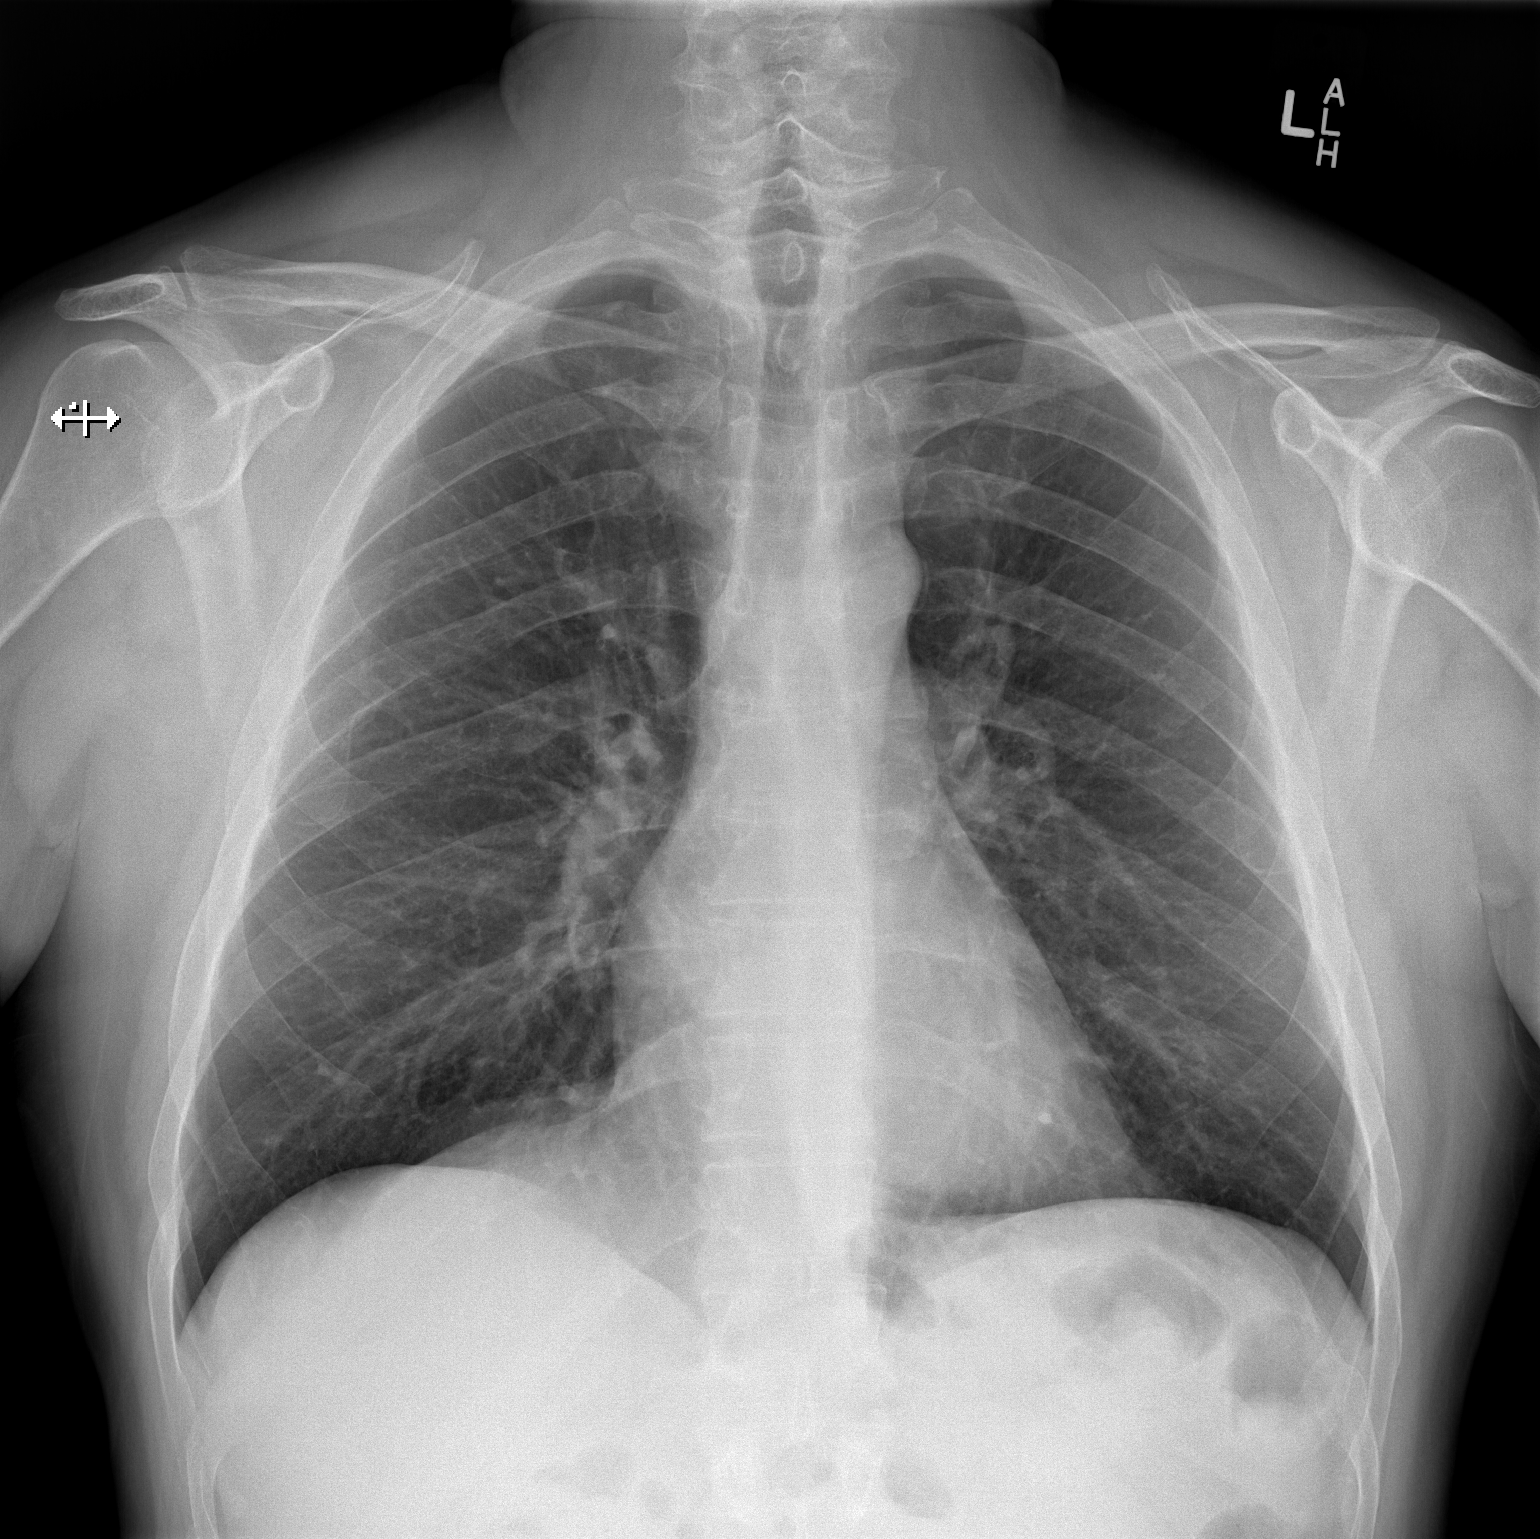

[w chest lat]
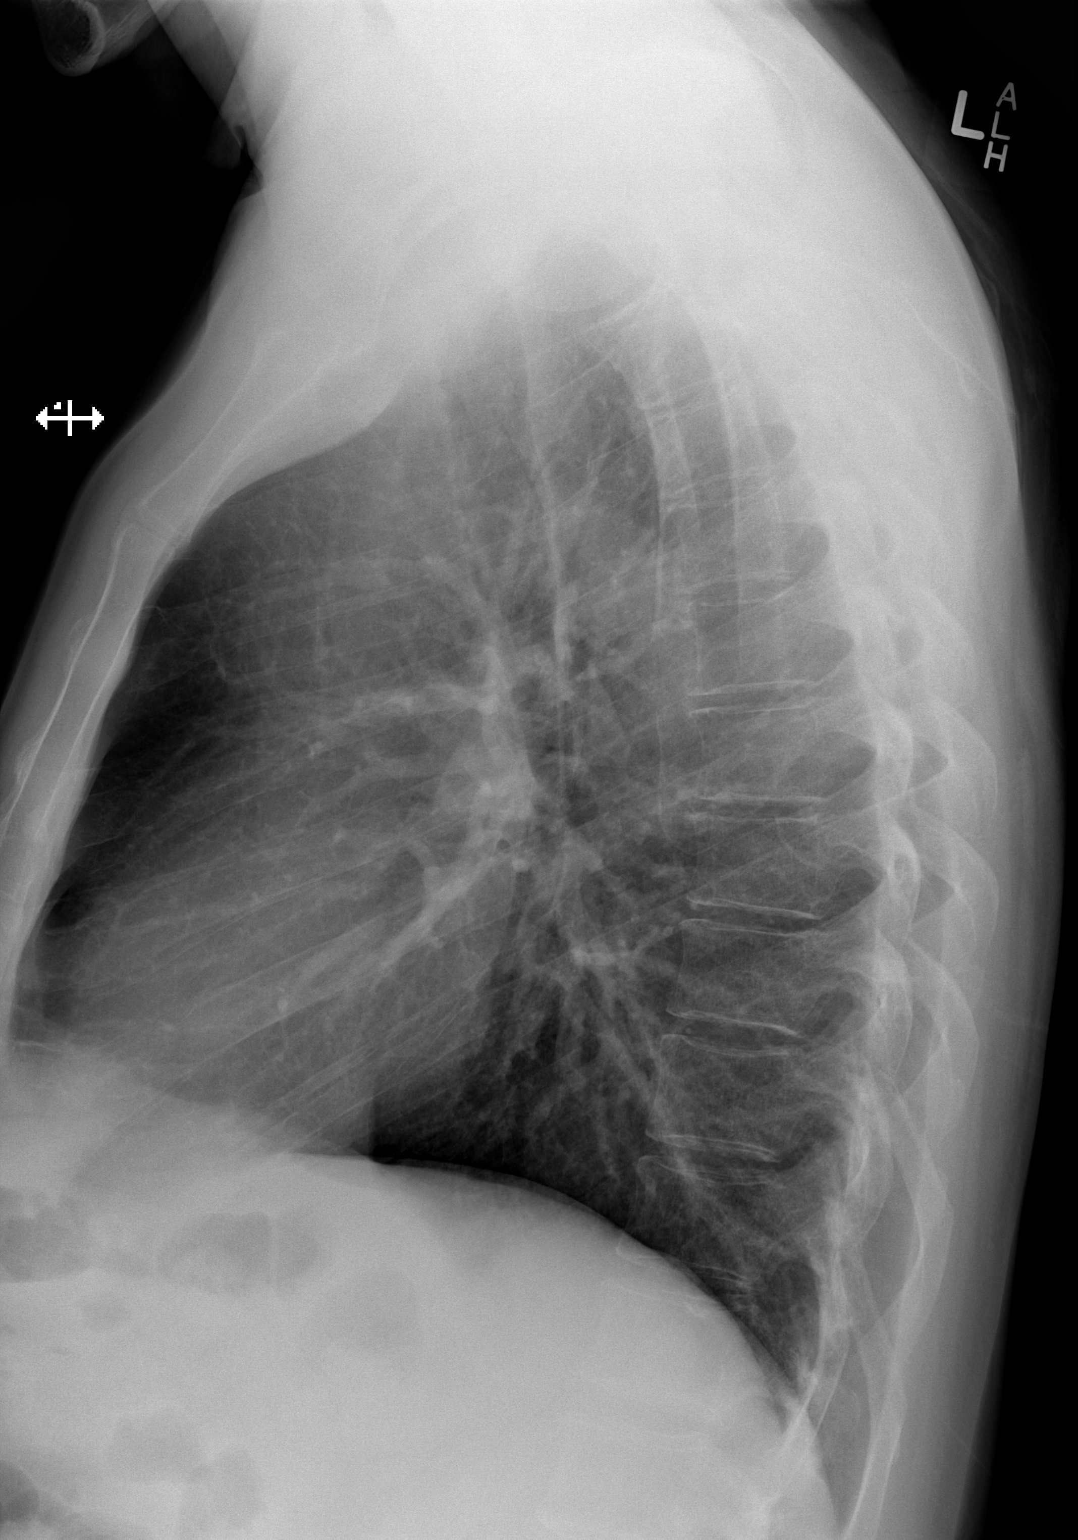

[2 of 2 positions shown; findings below may reference images not displayed]

FINDINGS: Mediastinum and hilar structures normal. Lungs are clear. No pleural
effusion or pneumothorax. No acute bony abnormality.
IMPRESSION: No acute cardiopulmonary disease.

## 2018-06-08 ENCOUNTER — Other Ambulatory Visit: Payer: Self-pay | Admitting: Nurse Practitioner

## 2018-06-08 DIAGNOSIS — I10 Essential (primary) hypertension: Secondary | ICD-10-CM

## 2018-06-19 ENCOUNTER — Telehealth: Payer: Self-pay | Admitting: Nurse Practitioner

## 2018-06-20 NOTE — Telephone Encounter (Signed)
lmtcb- NTBS - has been over a year since patient was last seen.

## 2018-06-27 ENCOUNTER — Other Ambulatory Visit: Payer: Self-pay

## 2018-06-30 ENCOUNTER — Other Ambulatory Visit: Payer: Self-pay

## 2018-06-30 ENCOUNTER — Encounter: Payer: Self-pay | Admitting: Nurse Practitioner

## 2018-06-30 ENCOUNTER — Ambulatory Visit: Payer: BLUE CROSS/BLUE SHIELD | Admitting: Nurse Practitioner

## 2018-06-30 VITALS — BP 149/97 | HR 80 | Temp 97.6°F | Ht 67.0 in | Wt 170.0 lb

## 2018-06-30 DIAGNOSIS — Z96643 Presence of artificial hip joint, bilateral: Secondary | ICD-10-CM

## 2018-06-30 DIAGNOSIS — E039 Hypothyroidism, unspecified: Secondary | ICD-10-CM

## 2018-06-30 DIAGNOSIS — E876 Hypokalemia: Secondary | ICD-10-CM

## 2018-06-30 DIAGNOSIS — E785 Hyperlipidemia, unspecified: Secondary | ICD-10-CM

## 2018-06-30 DIAGNOSIS — G4700 Insomnia, unspecified: Secondary | ICD-10-CM

## 2018-06-30 DIAGNOSIS — I10 Essential (primary) hypertension: Secondary | ICD-10-CM | POA: Diagnosis not present

## 2018-06-30 DIAGNOSIS — Z6825 Body mass index (BMI) 25.0-25.9, adult: Secondary | ICD-10-CM

## 2018-06-30 MED ORDER — METOPROLOL SUCCINATE ER 50 MG PO TB24: ORAL_TABLET | ORAL | 1 refills | Status: DC

## 2018-06-30 MED ORDER — POTASSIUM CHLORIDE CRYS ER 10 MEQ PO TBCR: EXTENDED_RELEASE_TABLET | ORAL | 1 refills | Status: DC

## 2018-06-30 MED ORDER — SIMVASTATIN 40 MG PO TABS: 40.0000 mg | ORAL_TABLET | Freq: Every day | ORAL | 1 refills | Status: DC

## 2018-06-30 MED ORDER — FENOFIBRATE 54 MG PO TABS: ORAL_TABLET | ORAL | 1 refills | Status: DC

## 2018-06-30 NOTE — Patient Instructions (Signed)

## 2018-06-30 NOTE — Progress Notes (Signed)
Subjective:    Patient ID: Christian King, male    DOB: 1970/11/28, 48 y.o.   MRN: [id number]   Chief Complaint: Medical Management of Chronic Issues    HPI:  1. Essential hypertension No c/o chest pain, sob or headache. Does not check blood preseeure at home.  2. Hyperlipidemia, unspecified hyperlipidemia type does not watch diet, does very little exercise  3. Hypothyroidism, unspecified type Having no problems that aware of.  4. Hereditary hemochromatosis ([place]) His ferritin was normal at last check.   5. Insomnia, unspecified type Has had no problems sleeping  6. BMI 25.0-25.9,adult Discussed diet and exercise for person with BMI >25 Will recheck weight in 3-6 months     Outpatient Encounter Medications as of 06/30/2018  Medication Sig  . aspirin 325 MG tablet Take 325 mg by mouth daily.  . magnesium oxide (MAG-OX) 400 MG tablet Take 1 tablet (400 mg total) by mouth daily.  . metoprolol succinate (TOPROL-XL) 50 MG 24 hr tablet TAKE 1 TABLET BY MOUTH WITH OR IMMEDIATELY FOLLOWING A MEAL.  . sildenafil (REVATIO) 20 MG tablet TAKE TWO TO FIVE TABLETS BY MOUTH AS NEEDED FOR SEX  . simvastatin (ZOCOR) 40 MG tablet Take 1 tablet (40 mg total) by mouth daily.  . fenofibrate 54 MG tablet Take 1 tablet by mouth  daily (Patient not taking: Reported on 06/30/2018)  . potassium chloride (K-DUR,KLOR-CON) 10 MEQ tablet Take 1 tablet by mouth  every day (Patient not taking: Reported on 06/30/2018)    New complaints: None today  Social history: Is a truck driver    Review of Systems  Constitutional: Negative for activity change and appetite change.  HENT: Negative.   Eyes: Negative for pain.  Respiratory: Negative for shortness of breath.   Cardiovascular: Negative for chest pain, palpitations and leg swelling.  Gastrointestinal: Negative for abdominal pain.  Endocrine: Negative for polydipsia.  Genitourinary: Negative.   Skin: Negative for rash.  Neurological:  Negative for dizziness, weakness and headaches.  Hematological: Does not bruise/bleed easily.  Psychiatric/Behavioral: Negative.   All other systems reviewed and are negative.      Objective:   Physical Exam Vitals signs and nursing note reviewed.  Constitutional:      Appearance: Normal appearance. He is well-developed.  HENT:     Head: Normocephalic.     Nose: Nose normal.  Eyes:     Pupils: Pupils are equal, round, and reactive to light.  Neck:     Musculoskeletal: Normal range of motion and neck supple.     Thyroid: No thyroid mass or thyromegaly.     Vascular: No carotid bruit or JVD.     Trachea: Phonation normal.  Cardiovascular:     Rate and Rhythm: Normal rate and regular rhythm.  Pulmonary:     Effort: Pulmonary effort is normal. No respiratory distress.     Breath sounds: Normal breath sounds.  Abdominal:     General: Bowel sounds are normal.     Palpations: Abdomen is soft.     Tenderness: There is no abdominal tenderness.  Musculoskeletal: Normal range of motion.  Lymphadenopathy:     Cervical: No cervical adenopathy.  Skin:    General: Skin is warm and dry.  Neurological:     Mental Status: He is alert and oriented to person, place, and time.  Psychiatric:        Behavior: Behavior normal.        Thought Content: Thought content normal.  Judgment: Judgment normal.     BP (!) 149/97   Pulse 80   Temp 97.6 F (36.4 C) (Oral)   Ht '5\' 7"'  (1.702 m)   Wt 170 lb (77.1 kg)   BMI 26.63 kg/m        Assessment & Plan:  Christian King comes in today with chief complaint of Medical Management of Chronic Issues   Diagnosis and orders addressed:  1. Essential hypertension Low sodium diet - CMP14+EGFR - metoprolol succinate (TOPROL-XL) 50 MG 24 hr tablet; Take with or immediately following a meal.  Dispense: 90 tablet; Refill: 1  2. Hyperlipidemia, unspecified hyperlipidemia type Low fat det - Lipid panel - simvastatin (ZOCOR) 40 MG  tablet; Take 1 tablet (40 mg total) by mouth daily.  Dispense: 90 tablet; Refill: 1 - fenofibrate 54 MG tablet; Take 1 tablet by mouth  daily  Dispense: 90 tablet; Refill: 1  3. Hypothyroidism, unspecified type - Thyroid Panel With TSH  4. Hereditary hemochromatosis ([place]) - CBC with Differential/Platelet  5. Insomnia, unspecified type Bedtime routine  6. BMI 25.0-25.9,adult Discussed diet and exercise for person with BMI >25 Will recheck weight in 3-6 months  7. History of bilateral hip replacements - Chromium and Cobalt, WB ([facility])  8. Hypokalemia - potassium chloride (K-DUR) 10 MEQ tablet; Take 1 tablet by mouth  every day  Dispense: 90 tablet; Refill: 1   Labs pending Health Maintenance reviewed Diet and exercise encouraged  Follow up plan: 6 months   Mary-Margaret Hassell Done, FNP

## 2018-07-01 NOTE — Telephone Encounter (Signed)
Patient seen 06/30/2018

## 2018-07-03 LAB — LIPID PANEL
Chol/HDL Ratio: 2.7 ratio (ref 0.0–5.0)
Cholesterol, Total: 118 mg/dL (ref 100–199)
HDL: 43 mg/dL (ref >39)
LDL Calculated: 33 mg/dL (ref 0–99)
Triglycerides: 208 mg/dL — ABNORMAL HIGH (ref 0–149)
VLDL Cholesterol Cal: 42 mg/dL — ABNORMAL HIGH (ref 5–40)

## 2018-07-03 LAB — CBC WITH DIFFERENTIAL/PLATELET
Basophils Absolute: 0 10*3/uL (ref 0.0–0.2)
Basos: 1 %
EOS (ABSOLUTE): 0 10*3/uL (ref 0.0–0.4)
Eos: 0 %
Hematocrit: 44.8 % (ref 37.5–51.0)
Hemoglobin: 15.3 g/dL (ref 13.0–17.7)
Immature Grans (Abs): 0 10*3/uL (ref 0.0–0.1)
Immature Granulocytes: 1 %
Lymphocytes Absolute: 0.9 10*3/uL (ref 0.7–3.1)
Lymphs: 13 %
MCH: 33.2 pg — ABNORMAL HIGH (ref 26.6–33.0)
MCHC: 34.2 g/dL (ref 31.5–35.7)
MCV: 97 fL (ref 79–97)
Monocytes Absolute: 0.7 10*3/uL (ref 0.1–0.9)
Monocytes: 10 %
Neutrophils Absolute: 5.2 10*3/uL (ref 1.4–7.0)
Neutrophils: 75 %
Platelets: 245 10*3/uL (ref 150–450)
RBC: 4.61 x10E6/uL (ref 4.14–5.80)
RDW: 12.9 % (ref 11.6–15.4)
WBC: 6.9 10*3/uL (ref 3.4–10.8)

## 2018-07-03 LAB — CMP14+EGFR
ALT: 24 IU/L (ref 0–44)
AST: 23 IU/L (ref 0–40)
Albumin/Globulin Ratio: 1.7 (ref 1.2–2.2)
Albumin: 4.6 g/dL (ref 4.0–5.0)
Alkaline Phosphatase: 104 IU/L (ref 39–117)
BUN/Creatinine Ratio: 11 (ref 9–20)
BUN: 10 mg/dL (ref 6–24)
Bilirubin Total: 1.1 mg/dL (ref 0.0–1.2)
CO2: 23 mmol/L (ref 20–29)
Calcium: 9.6 mg/dL (ref 8.7–10.2)
Chloride: 101 mmol/L (ref 96–106)
Creatinine, Ser: 0.87 mg/dL (ref 0.76–1.27)
GFR calc Af Amer: 116 mL/min/{1.73_m2} (ref >59)
GFR calc non Af Amer: 101 mL/min/{1.73_m2} (ref >59)
Globulin, Total: 2.7 g/dL (ref 1.5–4.5)
Glucose: 106 mg/dL — ABNORMAL HIGH (ref 65–99)
Potassium: 3.8 mmol/L (ref 3.5–5.2)
Sodium: 138 mmol/L (ref 134–144)
Total Protein: 7.3 g/dL (ref 6.0–8.5)

## 2018-07-03 LAB — THYROID PANEL WITH TSH
Free Thyroxine Index: 2 (ref 1.2–4.9)
T3 Uptake Ratio: 23 % — ABNORMAL LOW (ref 24–39)
T4, Total: 8.8 ug/dL (ref 4.5–12.0)
TSH: 2.25 u[IU]/mL (ref 0.450–4.500)

## 2018-07-03 LAB — CHROMIUM AND COBALT, WB (MOM)
Chromium: 1 ng/mL (ref <3.0)
Cobalt: <1 ng/mL (ref <3.0)

## 2018-09-22 ENCOUNTER — Encounter (HOSPITAL_COMMUNITY): Payer: Self-pay | Admitting: *Deleted

## 2018-09-22 ENCOUNTER — Emergency Department (HOSPITAL_COMMUNITY)
Admission: EM | Admit: 2018-09-22 | Discharge: 2018-09-22 | Disposition: A | Discharge status: Home / Self Care | Payer: BC Managed Care – PPO | Attending: Emergency Medicine | Admitting: Emergency Medicine

## 2018-09-22 ENCOUNTER — Emergency Department (HOSPITAL_COMMUNITY): Payer: BC Managed Care – PPO

## 2018-09-22 ENCOUNTER — Other Ambulatory Visit: Payer: Self-pay

## 2018-09-22 DIAGNOSIS — S59912A Unspecified injury of left forearm, initial encounter: Secondary | ICD-10-CM | POA: Diagnosis present

## 2018-09-22 DIAGNOSIS — S42345A Nondisplaced spiral fracture of shaft of humerus, left arm, initial encounter for closed fracture: Secondary | ICD-10-CM

## 2018-09-22 DIAGNOSIS — Y999 Unspecified external cause status: Secondary | ICD-10-CM | POA: Diagnosis not present

## 2018-09-22 DIAGNOSIS — Y929 Unspecified place or not applicable: Secondary | ICD-10-CM | POA: Insufficient documentation

## 2018-09-22 DIAGNOSIS — Z79899 Other long term (current) drug therapy: Secondary | ICD-10-CM | POA: Diagnosis not present

## 2018-09-22 DIAGNOSIS — F17210 Nicotine dependence, cigarettes, uncomplicated: Secondary | ICD-10-CM | POA: Insufficient documentation

## 2018-09-22 DIAGNOSIS — E039 Hypothyroidism, unspecified: Secondary | ICD-10-CM | POA: Diagnosis not present

## 2018-09-22 DIAGNOSIS — Y939 Activity, unspecified: Secondary | ICD-10-CM | POA: Insufficient documentation

## 2018-09-22 DIAGNOSIS — I10 Essential (primary) hypertension: Secondary | ICD-10-CM | POA: Diagnosis not present

## 2018-09-22 DIAGNOSIS — W19XXXA Unspecified fall, initial encounter: Secondary | ICD-10-CM

## 2018-09-22 DIAGNOSIS — Z7982 Long term (current) use of aspirin: Secondary | ICD-10-CM | POA: Diagnosis not present

## 2018-09-22 MED ORDER — HYDROMORPHONE HCL 1 MG/ML IJ SOLN
1.0000 mg | Freq: Once | INTRAMUSCULAR | Status: AC
  Administered 2018-09-22: 1 mg via INTRAMUSCULAR

## 2018-09-22 MED ORDER — METHOCARBAMOL 500 MG PO TABS
500.0000 mg | ORAL_TABLET | Freq: Once | ORAL | Status: AC
  Administered 2018-09-22: 500 mg via ORAL
  Filled 2018-09-22: qty 1

## 2018-09-22 MED ORDER — HYDROMORPHONE HCL 1 MG/ML IJ SOLN
1.0000 mg | Freq: Once | INTRAMUSCULAR | Status: DC
  Filled 2018-09-22: qty 1

## 2018-09-22 NOTE — ED Notes (Signed)
Patient verbalizes understanding of discharge instructions. Opportunity for questioning and answers were provided. Armband removed by staff, pt discharged from ED home via POV.  

## 2018-09-22 NOTE — ED Provider Notes (Signed)
[facility] Provider Note   CSN: [id number] Arrival date & time: 09/22/18  0404    History   Chief Complaint Chief Complaint  Patient presents with  . Arm Injury    HPI Christian King is a 48 y.o. male with a history of alcohol abuse, hemochromatosis, HLD, and HTN who presents to the emergency department with a chief complaint of fall.  The patient reports that he fell approximately 24 hours ago from standing onto his left arm on a tile floor.  He also hit his head and had a laceration above the left eyebrow.  The patient was on vacation in [place] and was evaluated in the ER.  He had imaging of his head and left upper extremity.  Laceration was repaired in the ER.  The patient was found to have a fracture of the left upper extremity.  The patient's wife reports that they did not obtain a disc of the x-rays prior to leaving the ER and she is unsure of exactly what type of fracture he had.  After the patient was placed in a splint, the returned to [place].  The patient is established with Dr. Madelon Lipsaffrey [facility].  She reports that they spoke with Dr. Candise Bowensaffrey's PA yesterday and they plan to see him in the clinic on Tuesday, 8/4.  However, they were given return precautions to the ER if you develop new or worsening symptoms.  The patient's wife reports that he is developed significant swelling in his left hand and fingers. 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  He denies numbness, weakness.  He is having severe pain in the left upper arm.  No chest pain, shortness of breath, neck or back pain, or left leg pain.     The history is provided by the patient. No language interpreter was used.    Past Medical History:  Diagnosis Date  . Alcohol abuse   . B12 deficiency   . Blood  dyscrasia    hemachromatosis  . Bronchitis   . Degenerative joint disease (DJD) of hip   . GERD (gastroesophageal reflux disease)   . Gout   . History of kidney stones   . HLD (hyperlipidemia)   . HTN (hypertension)   . Hypothyroidism   . Ventricular tachycardia (HCC)    in setting of hypokalemia/ hypomagnesemia and ETOH  . Wears glasses     Patient Active Problem List   Diagnosis Date Noted  . DVT (deep venous thrombosis) (HCC) 05/15/2016  . Hypokalemia 03/17/2016  . 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  . QT prolongation 03/20/2015  . BMI 25.0-25.9,adult 01/28/2015  . Insomnia 02/05/2014  . Hemochromatosis 10/03/2013  . Alcohol abuse 07/24/2010  . Hypothyroidism 04/26/2010  . B12 deficiency 04/26/2010  . Hyperlipidemia 04/26/2010  . Gout 04/26/2010  . Essential hypertension 04/26/2010  . h/o VENTRICULAR TACHYCARDIA 04/26/2010    Past Surgical History:  Procedure Laterality Date  . TOTAL HIP ARTHROPLASTY Right   . TOTAL HIP ARTHROPLASTY Left 03/16/2016   Procedure: TOTAL HIP ARTHROPLASTY;  Surgeon: Frederico Hammanaffrey, Daniel, MD;  Location: [facility] OR;  Service: Orthopedics;  Laterality: Left;  . vascularized fibular graft     right hip for avascular necrosis        Home Medications    Prior to  Admission medications   Medication Sig Start Date End Date Taking? Authorizing Provider  aspirin 325 MG tablet Take 325 mg by mouth daily.    [provider]  fenofibrate 54 MG tablet Take 1 tablet by mouth  daily 06/30/18   Daphine DeutscherMartin, Mary-Margaret, FNP  magnesium oxide (MAG-OX) 400 MG tablet Take 1 tablet (400 mg total) by mouth daily. 04/09/17   Bennie PieriniMartin, Mary-Margaret, FNP  metoprolol succinate (TOPROL-XL) 50 MG 24 hr tablet Take with or immediately following a meal. 06/30/18   Daphine DeutscherMartin, Mary-Margaret, FNP  potassium chloride (K-DUR) 10 MEQ tablet Take 1 tablet by mouth  every day 06/30/18   Bennie PieriniMartin, Mary-Margaret, FNP  sildenafil (REVATIO) 20 MG tablet TAKE TWO TO FIVE  TABLETS BY MOUTH AS NEEDED FOR SEX 03/26/17   Daphine DeutscherMartin, Mary-Margaret, FNP  simvastatin (ZOCOR) 40 MG tablet Take 1 tablet (40 mg total) by mouth daily. 06/30/18   Bennie PieriniMartin, Mary-Margaret, FNP    Family History Family History  Problem Relation Age of Onset  . Hypertension Father   . Diabetes Maternal Grandmother   . Hypertension Paternal Grandmother   . Stroke Paternal Grandmother     Social History Social History   Tobacco Use  . Smoking status: Current Every Day Smoker    Packs/day: 0.25    Types: Cigarettes    Start date: 05/05/1985  . Smokeless tobacco: Never Used  Substance Use Topics  . Alcohol use: Yes    Comment: daily  . Drug use: No     Allergies   Azithromycin, Hctz [hydrochlorothiazide], and Tramadol   Review of Systems Review of Systems  Constitutional: Negative for appetite change and fever.  Respiratory: Negative for shortness of breath.   Cardiovascular: Negative for chest pain, palpitations and leg swelling.  Gastrointestinal: Negative for abdominal pain, diarrhea, nausea and vomiting.  Genitourinary: Negative for dysuria.  Musculoskeletal: Positive for arthralgias and myalgias. Negative for back pain, neck pain and neck stiffness.  Skin: Negative for rash.  Allergic/Immunologic: Negative for immunocompromised state.  Neurological: Negative for dizziness, syncope, weakness, numbness and headaches.  Psychiatric/Behavioral: Negative for confusion.     Physical Exam Updated Vital Signs BP 119/88 (BP Location: Right Arm)   Pulse 65   Temp 98.1 F (36.7 C) (Oral)   Resp 20   SpO2 96%   Physical Exam Vitals signs and nursing note reviewed.  Constitutional:      Appearance: He is well-developed.  HENT:     Head: Normocephalic.  Eyes:     Conjunctiva/sclera: Conjunctivae normal.  Neck:     Musculoskeletal: Normal range of motion and neck supple.  Cardiovascular:     Rate and Rhythm: Normal rate and regular rhythm.     Pulses: Normal pulses.      Heart sounds: No murmur. No friction rub. No gallop.   Pulmonary:     Effort: Pulmonary effort is normal. No respiratory distress.     Breath sounds: No stridor. No wheezing, rhonchi or rales.  Chest:     Chest wall: No tenderness.  Abdominal:     General: There is no distension.     Palpations: Abdomen is soft.  Musculoskeletal:        General: Swelling, tenderness and signs of injury present. No deformity.     Right lower leg: No edema.     Left lower leg: No edema.     Comments: Ecchymosis noted to the left upper chest wall, just inferior to the axilla.   Tender to palpation over the left proximal  humerus.  Range of motion of the left shoulder is decreased significantly secondary to pain.  Unable to range of motion the left elbow secondary to pain.  Range of motion of the left wrist is decreased secondary to swelling.  Radial pulses are 1+ on the left and 2+ on the right.  There is tense swelling noted throughout all 5 digits of the left hand, to the left hand, and to the distal third of the left forearm.  Swelling is noted to the fingers of the left hand blanches with palpation.  Muscular compartments of the left upper extremity are soft.  Extremity is not cool in comparison to the right.  Sensation is intact to sharp and light touch to the bilateral upper and lower extremities.  Capillary refill is less than 2 seconds.  Skin:    General: Skin is warm and dry.  Neurological:     Mental Status: He is alert.  Psychiatric:        Behavior: Behavior normal.      ED Treatments / Results  Labs (all labs ordered are listed, but only abnormal results are displayed) Labs Reviewed - No data to display  EKG None  Radiology No results found.  Procedures Procedures (including critical care time)  Medications Ordered in ED Medications  methocarbamol (ROBAXIN) tablet 500 mg (500 mg Oral Given 09/22/18 0611)  HYDROmorphone (DILAUDID) injection 1 mg (1 mg Intramuscular Given 09/22/18  0610)     Initial Impression / Assessment and Plan / ED Course  I have reviewed the triage vital signs and the nursing notes.  Pertinent labs & imaging results that were available during my care of the patient were reviewed by me and considered in my medical decision making (see chart for details).        48 year old male with a history of alcohol abuse, hemochromatosis, HLD, and HTN presenting with known fracture to the left upper extremity after a fall from standing with EtOH on board approximately 24 hours ago.  He was seen and medically cleared from the fall at an ER in [facility].  Although x-rays of the left upper extremity were obtained, the patient did not obtain a disc of the results.  On arrival to the ER, splint is clean and intact.  Removed by me.  Will order pain medication and plan to reimage the entire left upper extremity as known fracture is near the proximal humerus, but the left hand and distal forearm are concerning for early compartment syndrome.  Likely will require hand surgery or orthopedic surgery consults for evaluation depending on imaging.  Patient care transferred to Dr. Candie Chroman, ER resident, under the supervision of Dr. Noemi Chapel, at the end of my shift. Patient presentation, ED course, and plan of care discussed with review of all pertinent labs and imaging. Please see his/her note for further details regarding further ED course and disposition.  Final Clinical Impressions(s) / ED Diagnoses   Final diagnoses:  Fall    ED Discharge Orders    None       Joanne Gavel, PA-C 09/22/18 [id number]    Noemi Chapel, MD 09/22/18 [phone]

## 2018-09-22 NOTE — ED Notes (Signed)
Pt was on vacation, was drinking, and fell on Saturday night. Pt reports he was referred to a specialist and has an appointment on Tuesday with ortho. Pt here today because he is concerned about swelling in his left hand. PMS in tact.

## 2018-09-22 NOTE — ED Triage Notes (Signed)
Pt was at [facility], had been drinking, and fell injuring L shoulder. Pt was told he fractured his L shoulder, sling and splint in place. Pt reports he returned home tonight and noticed significant swelling to L hand with discoloration (white) noted to L fourth and fifth digits. CMS intact, swelling noted.

## 2018-09-22 NOTE — Discharge Instructions (Signed)
Your xray shows a spiral fracture of your upper arm The orthopedist that I talked to today who is on call asked that you remain in your sling Keep your arm elevated above your heart to minimize swelling Add ice and make sure you are taking the pain medicines as prescribed or Ibuprofen 600mg  3 times daily as needed for pain.  Expect to have swelling and bruising.  ER for increased weakness / numbness or hand / arm.

## 2018-10-28 ENCOUNTER — Ambulatory Visit: Payer: BC Managed Care – PPO | Admitting: Physical Therapy

## 2018-10-31 ENCOUNTER — Encounter: Payer: Self-pay | Admitting: Physical Therapy

## 2018-10-31 ENCOUNTER — Other Ambulatory Visit: Payer: Self-pay

## 2018-10-31 ENCOUNTER — Ambulatory Visit: Payer: BC Managed Care – PPO | Attending: Orthopedic Surgery | Admitting: Physical Therapy

## 2018-10-31 DIAGNOSIS — M25612 Stiffness of left shoulder, not elsewhere classified: Secondary | ICD-10-CM | POA: Insufficient documentation

## 2018-10-31 DIAGNOSIS — M79622 Pain in left upper arm: Secondary | ICD-10-CM | POA: Diagnosis not present

## 2018-10-31 NOTE — Therapy (Signed)
[facility] [address] [place], [place], [address] Phone: [phone]   Fax:  [phone]  Physical Therapy Evaluation  Patient Details  Name: Christian King MRN: [id number] Date of Birth: 1970/10/31 Referring Provider (PT): Gabriel Rainwateraniel Caffery, MD   Encounter Date: 10/31/2018  PT End of Session - 10/31/18 2042    Visit Number  1    Number of Visits  16    Date for PT Re-Evaluation  01/02/19    Authorization Type  FOTO; Progress note every 10th visit    PT Start Time  0816    PT Stop Time  0859    PT Time Calculation (min)  43 min    Activity Tolerance  Patient tolerated treatment well    Behavior During Therapy  [facility] for tasks assessed/performed       Past Medical History:  Diagnosis Date  . Alcohol abuse   . B12 deficiency   . Blood dyscrasia    hemachromatosis  . Bronchitis   . Degenerative joint disease (DJD) of hip   . GERD (gastroesophageal reflux disease)   . Gout   . History of kidney stones   . HLD (hyperlipidemia)   . HTN (hypertension)   . Hypothyroidism   . Ventricular tachycardia (HCC)    in setting of hypokalemia/ hypomagnesemia and ETOH  . Wears glasses     Past Surgical History:  Procedure Laterality Date  . TOTAL HIP ARTHROPLASTY Right   . TOTAL HIP ARTHROPLASTY Left 03/16/2016   Procedure: TOTAL HIP ARTHROPLASTY;  Surgeon: Frederico Hammanaffrey, Daniel, MD;  Location: [facility] OR;  Service: Orthopedics;  Laterality: Left;  . vascularized fibular graft     right hip for avascular necrosis    There were no vitals filed for this visit.   Subjective Assessment - 10/31/18 2020    Subjective  COVID-19 screening performed upon arrival.Patient arrives to physical therapy with left upper arm pain and limited functional use due to a left humerus fracture on 09/20/2018. Patient reported tripping over his feet and falling straight on his left shoulder. Patient requires assistance for home activities and with various dressing  activities. Patient is able to shower on his own but with increased time and with caution of the left arm. Patient is independent with doffing sling but requires assistance from his wife for donning sling on. Patient reports minimal pain at worst and utilizes OTC medication and a muscle relaxer PRN for pain relief. 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    Pertinent History  left proximal fx 09/20/2018; bilateral hip replacements, HTN    Limitations  Lifting;House hold activities    Diagnostic tests  x:ray: nondisplaced spiral fx of left humerus, see media    Patient Stated Goals  use arm normally    Currently in Pain?  Yes   "minimal"   Pain Location  Arm    Pain Orientation  Left;Upper;Proximal    Pain Descriptors / Indicators  Discomfort    Pain Type  Acute pain    Pain Onset  More than a month ago    Pain Frequency  Intermittent    Aggravating Factors   accidentally moving it    Pain Relieving Factors  medication    Effect of Pain on Daily Activities  unable to use arm         [facility] PT Assessment - 10/31/18 0001  Assessment   Medical Diagnosis  left proximal humerus fracture    Referring Provider (PT)  Gabriel Rainwater, MD    Onset Date/Surgical Date  09/20/18    Hand Dominance  Right    Next MD Visit  11/17/2018    Prior Therapy  no      Precautions   Precautions  Other (comment)    Precaution Comments  early gentle PROM per MD referral      Balance Screen   Has the patient fallen in the past 6 months  Yes    How many times?  1    Has the patient had a decrease in activity level because of a fear of falling?   Yes    Is the patient reluctant to leave their home because of a fear of falling?   Yes      Home Environment   Living Environment  Private residence    Living Arrangements  Spouse/significant other      Prior Function   Level of  Independence  Needs assistance with ADLs;Needs assistance with homemaking      Observation/Other Assessments   Observations  sling donned    Focus on Therapeutic Outcomes (FOTO)   77% limitation      Posture/Postural Control   Posture/Postural Control  Postural limitations    Postural Limitations  Rounded Shoulders;Forward head      ROM / Strength   AROM / PROM / Strength  PROM      PROM   PROM Assessment Site  Shoulder    Right/Left Shoulder  Left    Left Shoulder Flexion  62 Degrees    Left Shoulder ABduction  55 Degrees    Left Shoulder Internal Rotation  --   to abdomen   Left Shoulder External Rotation  -20 Degrees      Palpation   Palpation comment  tenderness to left upper arm, notable firmness  in upper arm region, slight tenderness to pectoralis muscles      Transfers   Comments  independent with transfers, increased time for bed mobility                Objective measurements completed on examination: See above findings.              PT Education - 10/31/18 2034    Education Details  elbow AROM, AAROM shoulder flexion, education on slow progression of AAROM, going close to pain and not beyond it.    Person(s) Educated  Patient    Methods  Explanation;Demonstration;Handout    Comprehension  Verbalized understanding          PT Long Term Goals - 10/31/18 2044      PT LONG TERM GOAL #1   Title  Patient will be independent with HEP    Time  8    Period  Weeks    Status  New      PT LONG TERM GOAL #2   Title  Patient will demonstrate 145+ degrees of left shoulder flexion AROM to improve abiity to perform functional task    Time  8    Period  Weeks    Status  New      PT LONG TERM GOAL #3   Title  Patient will demonstrate 50+ degrees of left shoulder ER AROM to improve ability to don and doff apparel    Time  8    Period  Weeks    Status  New  PT LONG TERM GOAL #4   Title  Patient will demonstrate 4+/5 or greater left shoulder  MMT in all planes to improve stability during functional tasks.    Time  8    Period  Weeks    Status  New      PT LONG TERM GOAL #5   Title  Patient will report ability to perform ADLs and home task with left upper arm pain less than or greater than 3/10    Time  8    Period  Weeks    Status  New             Plan - 10/31/18 2109    Clinical Impression Statement  Patient is a 48 year old right handed male who presents to physical therapy with decreased left shoulder PROM, and left upper arm pain secondary to a left nondisplaced spiral fracture of the upper humerus on 09/20/2018. Patient is independent with doffing sling but requires assistance for donning sling. Patient noted with no visible ecchymosis throughout left upper arm, noted with palpable firmness in region of the fracture of the humerus. Patient and PT discussed gentle HEPs and discussed to be cautious not to go beyond pain with exercises. Patient reported understanding. Patient would benefit from skilled physical therapy to address deficits and address patient's goals.    Personal Factors and Comorbidities  Comorbidity 1    Comorbidities  left humerus fracture 09/20/2018, HTN, bilateral hip replacements,    Examination-Activity Limitations  Bathing;Bed Mobility;Carry;Dressing;Hygiene/Grooming;Lift    Examination-Participation Restrictions  Cleaning;Driving    Stability/Clinical Decision Making  Stable/Uncomplicated    Clinical Decision Making  Low    Rehab Potential  Good    PT Frequency  2x / week    PT Duration  8 weeks    PT Treatment/Interventions  Iontophoresis 4mg /ml Dexamethasone;ADLs/Self Care Home Management;Moist Heat;Electrical Stimulation;Cryotherapy;Neuromuscular re-education;Ultrasound;Therapeutic activities;Therapeutic exercise;Manual techniques;Vasopneumatic Device;Taping;Passive range of motion;Patient/family education    PT Next Visit Plan  gentle PROM per MD referral, modalities PRN for pain relief    PT  Home Exercise Plan  see patient education section    Consulted and Agree with Plan of Care  Patient       Patient will benefit from skilled therapeutic intervention in order to improve the following deficits and impairments:  Pain, Impaired UE functional use, Decreased activity tolerance, Decreased strength, Increased edema, Decreased range of motion  Visit Diagnosis: Pain in left upper arm - Plan: PT plan of care cert/re-cert  Stiffness of left shoulder, not elsewhere classified - Plan: PT plan of care cert/re-cert     Problem List Patient Active Problem List   Diagnosis Date Noted  . DVT (deep venous thrombosis) ([place]) 05/15/2016  . Hypokalemia 03/17/2016  . 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  . QT prolongation 03/20/2015  . BMI 25.0-25.9,adult 01/28/2015  . Insomnia 02/05/2014  . Hemochromatosis 10/03/2013  . Alcohol abuse 07/24/2010  . Hypothyroidism 04/26/2010  . B12 deficiency 04/26/2010  . Hyperlipidemia 04/26/2010  . Gout 04/26/2010  . Essential hypertension 04/26/2010  . h/o VENTRICULAR TACHYCARDIA 04/26/2010    Gabriela Eves PT, DPT 10/31/2018, 9:33 PM  [facility] [address] [place], [place], [address] Phone: [phone]   Fax:  [phone]  Name: Christian King MRN: [id number] Date of Birth: 1970-07-25

## 2018-11-04 ENCOUNTER — Encounter: Payer: BC Managed Care – PPO | Admitting: Physical Therapy

## 2018-11-06 ENCOUNTER — Ambulatory Visit: Payer: BC Managed Care – PPO | Admitting: Physical Therapy

## 2018-11-06 ENCOUNTER — Other Ambulatory Visit: Payer: Self-pay

## 2018-11-06 DIAGNOSIS — M25612 Stiffness of left shoulder, not elsewhere classified: Secondary | ICD-10-CM

## 2018-11-06 DIAGNOSIS — M79622 Pain in left upper arm: Secondary | ICD-10-CM

## 2018-11-06 NOTE — Therapy (Signed)
[facility] [address] [place], [place], [address] Phone: [phone]   Fax:  [phone]  Physical Therapy Treatment  Patient Details  Name: Christian King MRN: [id number] Date of Birth: Apr 30, 1970 Referring Provider (PT): Gabriel Rainwater, MD   Encounter Date: 11/06/2018  PT End of Session - 11/06/18 0820    Visit Number  2    Number of Visits  16    Date for PT Re-Evaluation  01/02/19    Authorization Type  FOTO; Progress note every 10th visit    PT Start Time  0815    PT Stop Time  0905    PT Time Calculation (min)  50 min    Activity Tolerance  Patient tolerated treatment well    Behavior During Therapy  [facility] for tasks assessed/performed       Past Medical History:  Diagnosis Date  . Alcohol abuse   . B12 deficiency   . Blood dyscrasia    hemachromatosis  . Bronchitis   . Degenerative joint disease (DJD) of hip   . GERD (gastroesophageal reflux disease)   . Gout   . History of kidney stones   . HLD (hyperlipidemia)   . HTN (hypertension)   . Hypothyroidism   . Ventricular tachycardia (HCC)    in setting of hypokalemia/ hypomagnesemia and ETOH  . Wears glasses     Past Surgical History:  Procedure Laterality Date  . TOTAL HIP ARTHROPLASTY Right   . TOTAL HIP ARTHROPLASTY Left 03/16/2016   Procedure: TOTAL HIP ARTHROPLASTY;  Surgeon: Frederico Hamman, MD;  Location: [facility] OR;  Service: Orthopedics;  Laterality: Left;  . vascularized fibular graft     right hip for avascular necrosis    There were no vitals filed for this visit.  Subjective Assessment - 11/06/18 0821    Subjective  COVID-19 screening performed upon arrival. Patient reports he can perform more ADLs but with compensatory movements.    Pertinent History  left proximal fx 09/20/2018; bilateral hip replacements, HTN    Limitations  Lifting;House hold activities    Diagnostic tests  x:ray: nondisplaced spiral fx of left humerus, see media    Patient  Stated Goals  use arm normally    Currently in Pain?  Yes    Pain Score  1     Pain Location  Arm    Pain Orientation  Left    Pain Onset  More than a month ago         [facility] PT Assessment - 11/06/18 0001      Assessment   Medical Diagnosis  left proximal humerus fracture    Referring Provider (PT)  Gabriel Rainwater, MD    Onset Date/Surgical Date  09/20/18    Hand Dominance  Right    Next MD Visit  11/17/2018    Prior Therapy  no      Precautions   Precautions  Other (comment)    Precaution Comments  early gentle PROM per MD referral                   [facility] Adult PT Treatment/Exercise - 11/06/18 0001      Modalities   Modalities  Electrical Stimulation      Electrical Stimulation   Electrical Stimulation Location  left arm    Electrical Stimulation Action  IFC    Electrical Stimulation Parameters  80-150 hz x10 mins    Electrical Stimulation Goals  Pain      Manual Therapy  Manual Therapy  Passive ROM    Passive ROM  gentle PROM to left shoulder in flexion, ER, and ABD with oscillations to decrease muscle guarding             PT Education - 11/06/18 0929    Education Details  shoulder flexion on counter top; importance of going to pain but not beyond it.    Person(s) Educated  Patient    Methods  Explanation;Demonstration;Handout    Comprehension  Verbalized understanding          PT Long Term Goals - 10/31/18 2044      PT LONG TERM GOAL #1   Title  Patient will be independent with HEP    Time  8    Period  Weeks    Status  New      PT LONG TERM GOAL #2   Title  Patient will demonstrate 145+ degrees of left shoulder flexion AROM to improve abiity to perform functional task    Time  8    Period  Weeks    Status  New      PT LONG TERM GOAL #3   Title  Patient will demonstrate 50+ degrees of left shoulder ER AROM to improve ability to don and doff apparel    Time  8    Period  Weeks    Status  New      PT LONG TERM GOAL #4    Title  Patient will demonstrate 4+/5 or greater left shoulder MMT in all planes to improve stability during functional tasks.    Time  8    Period  Weeks    Status  New      PT LONG TERM GOAL #5   Title  Patient will report ability to perform ADLs and home task with left upper arm pain less than or greater than 3/10    Time  8    Period  Weeks    Status  New            Plan - 11/06/18 0913    Clinical Impression Statement  Patient was able to tolerate treatment fairly well with only slight increase of discomfort particularly in the left triceps area. Oscillations performed to to promote muscle relaxation. Patient inquired about intimacy and whether it is okay to have sex. Patient was instructed to be careful and cautious during intimacy as well know his limitations. Patient reported understanding.    Personal Factors and Comorbidities  Comorbidity 1    Comorbidities  left humerus fracture 09/20/2018, HTN, bilateral hip replacements,    Examination-Activity Limitations  Bathing;Bed Mobility;Carry;Dressing;Hygiene/Grooming;Lift    Examination-Participation Restrictions  Cleaning;Driving    Stability/Clinical Decision Making  Stable/Uncomplicated    Clinical Decision Making  Low    Rehab Potential  Good    PT Frequency  2x / week    PT Duration  8 weeks    PT Treatment/Interventions  Iontophoresis 4mg /ml Dexamethasone;ADLs/Self Care Home Management;Moist Heat;Electrical Stimulation;Cryotherapy;Neuromuscular re-education;Ultrasound;Therapeutic activities;Therapeutic exercise;Manual techniques;Vasopneumatic Device;Taping;Passive range of motion;Patient/family education    PT Next Visit Plan  gentle PROM per MD referral, modalities PRN for pain relief    Consulted and Agree with Plan of Care  Patient       Patient will benefit from skilled therapeutic intervention in order to improve the following deficits and impairments:  Pain, Impaired UE functional use, Decreased activity tolerance,  Decreased strength, Increased edema, Decreased range of motion  Visit Diagnosis: Pain in left upper arm  Stiffness of left shoulder, not elsewhere classified     Problem List Patient Active Problem List   Diagnosis Date Noted  . DVT (deep venous thrombosis) (HCC) 05/15/2016  . Hypokalemia 03/17/2016  . 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  . QT prolongation 03/20/2015  . BMI 25.0-25.9,adult 01/28/2015  . Insomnia 02/05/2014  . Hemochromatosis 10/03/2013  . Alcohol abuse 07/24/2010  . Hypothyroidism 04/26/2010  . B12 deficiency 04/26/2010  . Hyperlipidemia 04/26/2010  . Gout 04/26/2010  . Essential hypertension 04/26/2010  . h/o VENTRICULAR TACHYCARDIA 04/26/2010    Guss BundeKrystle Harue Pribble, PT, DPT 11/06/2018, 9:43 AM  [facility] [address] [place], [place], [address] Phone: [phone]   Fax:  [phone]  Name: Christian King MRN: [id number] Date of Birth: 02/23/1970

## 2018-11-18 ENCOUNTER — Ambulatory Visit: Payer: BC Managed Care – PPO | Admitting: Physical Therapy

## 2018-11-18 ENCOUNTER — Other Ambulatory Visit: Payer: Self-pay

## 2018-11-18 DIAGNOSIS — M25612 Stiffness of left shoulder, not elsewhere classified: Secondary | ICD-10-CM

## 2018-11-18 DIAGNOSIS — M79622 Pain in left upper arm: Secondary | ICD-10-CM | POA: Diagnosis not present

## 2018-11-18 NOTE — Therapy (Signed)
[facility] [address] [place], [place], [address] Phone: [phone]   Fax:  [phone]  Physical Therapy Treatment  Patient Details  Name: Christian King MRN: [id number] Date of Birth: [birth year] Referring Provider (PT): Gabriel Rainwater, MD   Encounter Date: 11/18/2018  PT End of Session - 11/18/18 1245    Visit Number  3    Number of Visits  16    Date for PT Re-Evaluation  01/02/19    Authorization Type  FOTO; Progress note every 10th visit    PT Start Time  1115    PT Stop Time  1206    PT Time Calculation (min)  51 min    Activity Tolerance  Patient tolerated treatment well    Behavior During Therapy  [facility] for tasks assessed/performed       Past Medical History:  Diagnosis Date  . Alcohol abuse   . B12 deficiency   . Blood dyscrasia    hemachromatosis  . Bronchitis   . Degenerative joint disease (DJD) of hip   . GERD (gastroesophageal reflux disease)   . Gout   . History of kidney stones   . HLD (hyperlipidemia)   . HTN (hypertension)   . Hypothyroidism   . Ventricular tachycardia (HCC)    in setting of hypokalemia/ hypomagnesemia and ETOH  . Wears glasses     Past Surgical History:  Procedure Laterality Date  . TOTAL HIP ARTHROPLASTY Right   . TOTAL HIP ARTHROPLASTY Left 03/16/2016   Procedure: TOTAL HIP ARTHROPLASTY;  Surgeon: Frederico Hamman, MD;  Location: [facility] OR;  Service: Orthopedics;  Laterality: Left;  . vascularized fibular graft     right hip for avascular necrosis    There were no vitals filed for this visit.      [facility] PT Assessment - 11/18/18 0001      Assessment   Medical Diagnosis  left proximal humerus fracture    Referring Provider (PT)  Gabriel Rainwater, MD    Onset Date/Surgical Date  09/20/18    Hand Dominance  Right    Next MD Visit  11/17/2018    Prior Therapy  no      Precautions   Precautions  Other (comment)    Precaution Comments  early gentle PROM per MD referral                    [facility] Adult PT Treatment/Exercise - 11/18/18 0001      Exercises   Exercises  Shoulder      Shoulder Exercises: Supine   Protraction  AAROM;Both;20 reps    External Rotation  AAROM;20 reps;Left    Flexion  AAROM;Both;20 reps      Shoulder Exercises: Pulleys   Flexion  3 minutes      Shoulder Exercises: ROM/Strengthening   Ranger  seated UE ranger flexion, CW, CCW, x3 minutes each      Modalities   Modalities  Electrical Stimulation;Moist Heat      Moist Heat Therapy   Number Minutes Moist Heat  10 Minutes    Moist Heat Location  Shoulder      Electrical Stimulation   Electrical Stimulation Location  left arm    Electrical Stimulation Action  IFC    Electrical Stimulation Parameters  80-150 hz x10    Electrical Stimulation Goals  Pain      Manual Therapy   Manual Therapy  Passive ROM    Passive ROM  gentle PROM to left shoulder in  flexion, ER, and ABD with oscillations to decrease muscle guarding                  PT Long Term Goals - 10/31/18 2044      PT LONG TERM GOAL #1   Title  Patient will be independent with HEP    Time  8    Period  Weeks    Status  New      PT LONG TERM GOAL #2   Title  Patient will demonstrate 145+ degrees of left shoulder flexion AROM to improve abiity to perform functional task    Time  8    Period  Weeks    Status  New      PT LONG TERM GOAL #3   Title  Patient will demonstrate 50+ degrees of left shoulder ER AROM to improve ability to don and doff apparel    Time  8    Period  Weeks    Status  New      PT LONG TERM GOAL #4   Title  Patient will demonstrate 4+/5 or greater left shoulder MMT in all planes to improve stability during functional tasks.    Time  8    Period  Weeks    Status  New      PT LONG TERM GOAL #5   Title  Patient will report ability to perform ADLs and home task with left upper arm pain less than or greater than 3/10    Time  8    Period  Weeks    Status  New             Plan - 11/18/18 1245    Clinical Impression Statement  Patient responded well to therapy session with progression to [facility] of left shoulder exercises. Patient denied discomfort and reported feeling a good but gentle stretch in the left UE. Patient provided with new HEP to which patient reported understanding. No adverse affects noted upon removal of modalities.    Personal Factors and Comorbidities  Comorbidity 1    Comorbidities  left humerus fracture 09/20/2018, HTN, bilateral hip replacements,    Examination-Activity Limitations  Bathing;Bed Mobility;Carry;Dressing;Hygiene/Grooming;Lift    Examination-Participation Restrictions  Cleaning;Driving    Stability/Clinical Decision Making  Stable/Uncomplicated    Clinical Decision Making  Low    Rehab Potential  Good    PT Frequency  2x / week    PT Duration  8 weeks    PT Treatment/Interventions  Iontophoresis 4mg /ml Dexamethasone;ADLs/Self Care Home Management;Moist Heat;Electrical Stimulation;Cryotherapy;Neuromuscular re-education;Ultrasound;Therapeutic activities;Therapeutic exercise;Manual techniques;Vasopneumatic Device;Taping;Passive range of motion;Patient/family education    PT Next Visit Plan  AAROM to left arm, PROM, modalities PRN for pain relief    PT Home Exercise Plan  see patient education section    Consulted and Agree with Plan of Care  Patient       Patient will benefit from skilled therapeutic intervention in order to improve the following deficits and impairments:  Pain, Impaired UE functional use, Decreased activity tolerance, Decreased strength, Increased edema, Decreased range of motion  Visit Diagnosis: Pain in left upper arm  Stiffness of left shoulder, not elsewhere classified     Problem List Patient Active Problem List   Diagnosis Date Noted  . DVT (deep venous thrombosis) ([place]) 05/15/2016  . Hypokalemia 03/17/2016  . 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  . QT prolongation  03/20/2015  . BMI 25.0-25.9,adult 01/28/2015  . Insomnia 02/05/2014  . Hemochromatosis 10/03/2013  . Alcohol  abuse 07/24/2010  . Hypothyroidism 04/26/2010  . B12 deficiency 04/26/2010  . Hyperlipidemia 04/26/2010  . Gout 04/26/2010  . Essential hypertension 04/26/2010  . h/o VENTRICULAR TACHYCARDIA 04/26/2010    Samuella CotaKrystle Reni Hausner,PT, DPT 11/18/2018, 12:55 PM  [facility] [address] [place], [place], [address] Phone: [phone]   Fax:  [phone]  Name: Christian King MRN: [id number] Date of Birth: May 09, 1970

## 2018-11-20 ENCOUNTER — Encounter: Payer: Self-pay | Admitting: Physical Therapy

## 2018-11-20 ENCOUNTER — Other Ambulatory Visit: Payer: Self-pay

## 2018-11-20 ENCOUNTER — Ambulatory Visit: Payer: BC Managed Care – PPO | Attending: Orthopedic Surgery | Admitting: Physical Therapy

## 2018-11-20 DIAGNOSIS — M79622 Pain in left upper arm: Secondary | ICD-10-CM | POA: Insufficient documentation

## 2018-11-20 DIAGNOSIS — M25612 Stiffness of left shoulder, not elsewhere classified: Secondary | ICD-10-CM | POA: Insufficient documentation

## 2018-11-20 NOTE — Therapy (Signed)
[facility] [address] [place], [place], [address] Phone: [phone]   Fax:  [phone]  Physical Therapy Treatment  Patient Details  Name: Christian King MRN: [id number] Date of Birth: 1970-02-28 Referring Provider (PT): Gabriel Rainwateraniel Caffery, MD   Encounter Date: 11/20/2018  PT End of Session - 11/20/18 1214    Visit Number  4    Number of Visits  16    Date for PT Re-Evaluation  01/02/19    Authorization Type  FOTO; Progress note every 10th visit    PT Start Time  1115    PT Stop Time  1208    PT Time Calculation (min)  53 min    Activity Tolerance  Patient tolerated treatment well    Behavior During Therapy  [facility] for tasks assessed/performed       Past Medical History:  Diagnosis Date  . Alcohol abuse   . B12 deficiency   . Blood dyscrasia    hemachromatosis  . Bronchitis   . Degenerative joint disease (DJD) of hip   . GERD (gastroesophageal reflux disease)   . Gout   . History of kidney stones   . HLD (hyperlipidemia)   . HTN (hypertension)   . Hypothyroidism   . Ventricular tachycardia (HCC)    in setting of hypokalemia/ hypomagnesemia and ETOH  . Wears glasses     Past Surgical History:  Procedure Laterality Date  . TOTAL HIP ARTHROPLASTY Right   . TOTAL HIP ARTHROPLASTY Left 03/16/2016   Procedure: TOTAL HIP ARTHROPLASTY;  Surgeon: Frederico Hammanaffrey, Daniel, MD;  Location: [facility] OR;  Service: Orthopedics;  Laterality: Left;  . vascularized fibular graft     right hip for avascular necrosis    There were no vitals filed for this visit.  Subjective Assessment - 11/20/18 1124    Subjective  COVID-19 screening performed upon arrival. Patient reports being able to clean up the kitchen and his basement with minimal pain.    Pertinent History  left proximal fx 09/20/2018; bilateral hip replacements, HTN    Limitations  Lifting;House hold activities    Diagnostic tests  x:ray: nondisplaced spiral fx of left humerus, see media     Patient Stated Goals  use arm normally    Currently in Pain?  Yes    Pain Score  2     Pain Location  Arm    Pain Orientation  Left    Pain Descriptors / Indicators  Discomfort    Pain Type  Acute pain    Pain Onset  More than a month ago    Pain Frequency  Intermittent         [facility] PT Assessment - 11/20/18 0001      Assessment   Medical Diagnosis  left proximal humerus fracture    Referring Provider (PT)  Gabriel Rainwateraniel Caffery, MD    Onset Date/Surgical Date  09/20/18    Hand Dominance  Right    Next MD Visit  11/17/2018    Prior Therapy  no      Precautions   Precautions  Other (comment)    Precaution Comments  Leota SauersAAROM                   [facility] Adult PT Treatment/Exercise - 11/20/18 0001      Exercises   Exercises  Shoulder      Shoulder Exercises: Supine   Protraction  AAROM;Both;20 reps    External Rotation  AAROM;20 reps;Left    Flexion  AAROM;Both;20  reps      Shoulder Exercises: Pulleys   Flexion  5 minutes      Shoulder Exercises: ROM/Strengthening   Ranger  seated UE ranger flexion, CW, CCW, x3 minutes each    Other ROM/Strengthening Exercises  wall ladder to 25 x10      Modalities   Modalities  Electrical Stimulation;Moist Heat      Moist Heat Therapy   Number Minutes Moist Heat  10 Minutes    Moist Heat Location  Shoulder      Electrical Stimulation   Electrical Stimulation Location  left arm    Electrical Stimulation Action  IFC    Electrical Stimulation Parameters  80-150 hz x10 mins    Electrical Stimulation Goals  Pain      Manual Therapy   Manual Therapy  Passive ROM    Passive ROM  gentle PROM to left shoulder in flexion, ER, and ABD with oscillations to decrease muscle guarding                  PT Long Term Goals - 10/31/18 2044      PT LONG TERM GOAL #1   Title  Patient will be independent with HEP    Time  8    Period  Weeks    Status  New      PT LONG TERM GOAL #2   Title  Patient will demonstrate 145+  degrees of left shoulder flexion AROM to improve abiity to perform functional task    Time  8    Period  Weeks    Status  New      PT LONG TERM GOAL #3   Title  Patient will demonstrate 50+ degrees of left shoulder ER AROM to improve ability to don and doff apparel    Time  8    Period  Weeks    Status  New      PT LONG TERM GOAL #4   Title  Patient will demonstrate 4+/5 or greater left shoulder MMT in all planes to improve stability during functional tasks.    Time  8    Period  Weeks    Status  New      PT LONG TERM GOAL #5   Title  Patient will report ability to perform ADLs and home task with left upper arm pain less than or greater than 3/10    Time  8    Period  Weeks    Status  New            Plan - 11/20/18 1215    Clinical Impression Statement  Patient responded well with minimal reports of increased pain. Patient was able to demonstrate good form with all AAROM activities. Patient reminded to be mindful of not overdoing activities. Patient reported understanding. No adverse affects upon removal of modalities.    Personal Factors and Comorbidities  Comorbidity 1    Comorbidities  left humerus fracture 09/20/2018, HTN, bilateral hip replacements,    Examination-Activity Limitations  Bathing;Bed Mobility;Carry;Dressing;Hygiene/Grooming;Lift    Examination-Participation Restrictions  Cleaning;Driving    Stability/Clinical Decision Making  Stable/Uncomplicated    Clinical Decision Making  Low    Rehab Potential  Good    PT Frequency  2x / week    PT Duration  8 weeks    PT Treatment/Interventions  Iontophoresis 4mg /ml Dexamethasone;ADLs/Self Care Home Management;Moist Heat;Electrical Stimulation;Cryotherapy;Neuromuscular re-education;Ultrasound;Therapeutic activities;Therapeutic exercise;Manual techniques;Vasopneumatic Device;Taping;Passive range of motion;Patient/family education    PT Next Visit Plan  AAROM  to left arm, PROM, modalities PRN for pain relief    Consulted  and Agree with Plan of Care  Patient       Patient will benefit from skilled therapeutic intervention in order to improve the following deficits and impairments:  Pain, Impaired UE functional use, Decreased activity tolerance, Decreased strength, Increased edema, Decreased range of motion  Visit Diagnosis: Pain in left upper arm  Stiffness of left shoulder, not elsewhere classified     Problem List Patient Active Problem List   Diagnosis Date Noted  . DVT (deep venous thrombosis) ([place]) 05/15/2016  . Hypokalemia 03/17/2016  . 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  . QT prolongation 03/20/2015  . BMI 25.0-25.9,adult 01/28/2015  . Insomnia 02/05/2014  . Hemochromatosis 10/03/2013  . Alcohol abuse 07/24/2010  . Hypothyroidism 04/26/2010  . B12 deficiency 04/26/2010  . Hyperlipidemia 04/26/2010  . Gout 04/26/2010  . Essential hypertension 04/26/2010  . h/o VENTRICULAR TACHYCARDIA 04/26/2010    Gabriela Eves, PT, DPT 11/20/2018, 12:27 PM  [facility] [address] [place], [place], [address] Phone: [phone]   Fax:  [phone]  Name: Christian King MRN: [id number] Date of Birth: 1970/06/17

## 2018-11-25 ENCOUNTER — Ambulatory Visit: Payer: BC Managed Care – PPO | Admitting: Physical Therapy

## 2018-11-27 ENCOUNTER — Encounter: Payer: Self-pay | Admitting: Physical Therapy

## 2018-11-27 ENCOUNTER — Ambulatory Visit: Payer: BC Managed Care – PPO | Admitting: Physical Therapy

## 2018-11-27 ENCOUNTER — Other Ambulatory Visit: Payer: Self-pay

## 2018-11-27 DIAGNOSIS — M79622 Pain in left upper arm: Secondary | ICD-10-CM | POA: Diagnosis not present

## 2018-11-27 DIAGNOSIS — M25612 Stiffness of left shoulder, not elsewhere classified: Secondary | ICD-10-CM

## 2018-11-27 NOTE — Therapy (Signed)
[facility] [address] [place], [place], [address] Phone: [phone]   Fax:  [phone]  Physical Therapy Treatment  Patient Details  Name: Christian NuttingMichael L Holst MRN: [id number] Date of Birth: 07-03-1970 Referring Provider (PT): Gabriel Rainwateraniel Caffery, MD   Encounter Date: 11/27/2018  PT End of Session - 11/27/18 1129    Visit Number  5    Number of Visits  16    Date for PT Re-Evaluation  01/02/19    Authorization Type  FOTO; Progress note every 10th visit    PT Start Time  1115    PT Stop Time  1206    PT Time Calculation (min)  51 min    Activity Tolerance  Patient tolerated treatment well    Behavior During Therapy  [facility] for tasks assessed/performed       Past Medical History:  Diagnosis Date  . Alcohol abuse   . B12 deficiency   . Blood dyscrasia    hemachromatosis  . Bronchitis   . Degenerative joint disease (DJD) of hip   . GERD (gastroesophageal reflux disease)   . Gout   . History of kidney stones   . HLD (hyperlipidemia)   . HTN (hypertension)   . Hypothyroidism   . Ventricular tachycardia (HCC)    in setting of hypokalemia/ hypomagnesemia and ETOH  . Wears glasses     Past Surgical History:  Procedure Laterality Date  . TOTAL HIP ARTHROPLASTY Right   . TOTAL HIP ARTHROPLASTY Left 03/16/2016   Procedure: TOTAL HIP ARTHROPLASTY;  Surgeon: Frederico Hammanaffrey, Daniel, MD;  Location: [facility] OR;  Service: Orthopedics;  Laterality: Left;  . vascularized fibular graft     right hip for avascular necrosis    There were no vitals filed for this visit.  Subjective Assessment - 11/27/18 1128    Subjective  COVID-19 screening performed upon arrival. Patient reports feeling good but with ongoing weakness    Pertinent History  left proximal fx 09/20/2018; bilateral hip replacements, HTN    Limitations  Lifting;House hold activities    Diagnostic tests  x:ray: nondisplaced spiral fx of left humerus, see media    Patient Stated Goals  use arm  normally    Currently in Pain?  Yes    Pain Score  3     Pain Location  Arm    Pain Orientation  Left    Pain Descriptors / Indicators  Discomfort    Pain Type  Acute pain    Pain Onset  More than a month ago    Pain Frequency  Intermittent         [facility] PT Assessment - 11/27/18 0001      Assessment   Medical Diagnosis  left proximal humerus fracture    Referring Provider (PT)  Gabriel Rainwateraniel Caffery, MD    Onset Date/Surgical Date  09/20/18    Hand Dominance  Right    Next MD Visit  12/08/2018    Prior Therapy  no      Precautions   Precautions  Other (comment)    Precaution Comments  Leota SauersAAROM                   [facility] Adult PT Treatment/Exercise - 11/27/18 0001      Exercises   Exercises  Shoulder      Shoulder Exercises: Supine   Protraction  AAROM;Both;20 reps   x30   External Rotation  AAROM;20 reps;Left   x30   Flexion  AAROM;Both;20 reps;10 reps  x30     Shoulder Exercises: Pulleys   Flexion  5 minutes      Shoulder Exercises: ROM/Strengthening   UBE (Upper Arm Bike)  120 RPM, x8 mins 4 fwd, 4 bwd AAROM    Ranger  seated UE ranger flexion, CW, CCW, x3 minutes each    Other ROM/Strengthening Exercises  wall ladder to 25 x10      Modalities   Modalities  Electrical Stimulation;Moist Heat      Moist Heat Therapy   Number Minutes Moist Heat  15 Minutes    Moist Heat Location  Shoulder      Electrical Stimulation   Electrical Stimulation Location  left arm    Electrical Stimulation Action  IFC    Electrical Stimulation Parameters  80-150 hz x15 pain    Electrical Stimulation Goals  Pain      Manual Therapy   Manual Therapy  --    Passive ROM  --                  PT Long Term Goals - 10/31/18 2044      PT LONG TERM GOAL #1   Title  Patient will be independent with HEP    Time  8    Period  Weeks    Status  New      PT LONG TERM GOAL #2   Title  Patient will demonstrate 145+ degrees of left shoulder flexion AROM to improve abiity  to perform functional task    Time  8    Period  Weeks    Status  New      PT LONG TERM GOAL #3   Title  Patient will demonstrate 50+ degrees of left shoulder ER AROM to improve ability to don and doff apparel    Time  8    Period  Weeks    Status  New      PT LONG TERM GOAL #4   Title  Patient will demonstrate 4+/5 or greater left shoulder MMT in all planes to improve stability during functional tasks.    Time  8    Period  Weeks    Status  New      PT LONG TERM GOAL #5   Title  Patient will report ability to perform ADLs and home task with left upper arm pain less than or greater than 3/10    Time  8    Period  Weeks    Status  New            Plan - 11/27/18 1144    Clinical Impression Statement  Patient was able to tolerate treatment well with no increase of pain. Patient demonstrated good form with all exercises. Patient was reminded to allow for unaffected arms to do most of the work. Patient reported understanding. Normal response to modalities upon removal of modalities.    Personal Factors and Comorbidities  Comorbidity 1    Comorbidities  left humerus fracture 09/20/2018, HTN, bilateral hip replacements,    Examination-Activity Limitations  Bathing;Bed Mobility;Carry;Dressing;Hygiene/Grooming;Lift    Examination-Participation Restrictions  Cleaning;Driving    Stability/Clinical Decision Making  Stable/Uncomplicated    Clinical Decision Making  Low    Rehab Potential  Good    PT Frequency  2x / week    PT Duration  8 weeks    PT Treatment/Interventions  Iontophoresis 4mg /ml Dexamethasone;ADLs/Self Care Home Management;Moist Heat;Electrical Stimulation;Cryotherapy;Neuromuscular re-education;Ultrasound;Therapeutic activities;Therapeutic exercise;Manual techniques;Vasopneumatic Device;Taping;Passive range of motion;Patient/family education    PT  Next Visit Plan  AAROM to left arm, PROM, modalities PRN for pain relief       Patient will benefit from skilled therapeutic  intervention in order to improve the following deficits and impairments:  Pain, Impaired UE functional use, Decreased activity tolerance, Decreased strength, Increased edema, Decreased range of motion  Visit Diagnosis: Pain in left upper arm  Stiffness of left shoulder, not elsewhere classified     Problem List Patient Active Problem List   Diagnosis Date Noted  . DVT (deep venous thrombosis) (HCC) 05/15/2016  . Hypokalemia 03/17/2016  . 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  . QT prolongation 03/20/2015  . BMI 25.0-25.9,adult 01/28/2015  . Insomnia 02/05/2014  . Hemochromatosis 10/03/2013  . Alcohol abuse 07/24/2010  . Hypothyroidism 04/26/2010  . B12 deficiency 04/26/2010  . Hyperlipidemia 04/26/2010  . Gout 04/26/2010  . Essential hypertension 04/26/2010  . h/o VENTRICULAR TACHYCARDIA 04/26/2010    Guss Bunde, PT, DPT 11/27/2018, 12:52 PM  [facility] [address] [place], [place], [address] Phone: [phone]   Fax:  [phone]  Name: DELSON DULWORTH MRN: [id number] Date of Birth: 19-Aug-1970

## 2018-12-02 ENCOUNTER — Ambulatory Visit: Payer: BC Managed Care – PPO | Admitting: Physical Therapy

## 2018-12-02 ENCOUNTER — Encounter: Payer: Self-pay | Admitting: Physical Therapy

## 2018-12-02 ENCOUNTER — Other Ambulatory Visit: Payer: Self-pay

## 2018-12-02 DIAGNOSIS — M79622 Pain in left upper arm: Secondary | ICD-10-CM | POA: Diagnosis not present

## 2018-12-02 DIAGNOSIS — M25612 Stiffness of left shoulder, not elsewhere classified: Secondary | ICD-10-CM

## 2018-12-02 NOTE — Therapy (Signed)
[facility] [address] [place], [place], [address] Phone: [phone]   Fax:  [phone]  Physical Therapy Treatment  Patient Details  Name: Christian King MRN: [id number] Date of Birth: 1970/11/27 Referring Provider (PT): Gabriel Rainwater, MD   Encounter Date: 12/02/2018  PT End of Session - 12/02/18 1628    Visit Number  6    Number of Visits  16    Date for PT Re-Evaluation  01/02/19    Authorization Type  FOTO; Progress note every 10th visit    PT Start Time  1600    PT Stop Time  1650    PT Time Calculation (min)  50 min    Activity Tolerance  Patient tolerated treatment well    Behavior During Therapy  [facility] for tasks assessed/performed       Past Medical History:  Diagnosis Date  . Alcohol abuse   . B12 deficiency   . Blood dyscrasia    hemachromatosis  . Bronchitis   . Degenerative joint disease (DJD) of hip   . GERD (gastroesophageal reflux disease)   . Gout   . History of kidney stones   . HLD (hyperlipidemia)   . HTN (hypertension)   . Hypothyroidism   . Ventricular tachycardia (HCC)    in setting of hypokalemia/ hypomagnesemia and ETOH  . Wears glasses     Past Surgical History:  Procedure Laterality Date  . TOTAL HIP ARTHROPLASTY Right   . TOTAL HIP ARTHROPLASTY Left 03/16/2016   Procedure: TOTAL HIP ARTHROPLASTY;  Surgeon: Frederico Hamman, MD;  Location: [facility] OR;  Service: Orthopedics;  Laterality: Left;  . vascularized fibular graft     right hip for avascular necrosis    There were no vitals filed for this visit.  Subjective Assessment - 12/02/18 1627    Subjective  COVID-19 screening performed upon arrival. Patient reports feeling good and MD called PT stating he is ready to add strengthening.    Pertinent History  left proximal fx 09/20/2018; bilateral hip replacements, HTN    Limitations  Lifting;House hold activities    Diagnostic tests  x:ray: nondisplaced spiral fx of left humerus, see media     Patient Stated Goals  use arm normally    Currently in Pain?  No/denies         [facility] PT Assessment - 12/02/18 0001      Assessment   Medical Diagnosis  left proximal humerus fracture    Referring Provider (PT)  Gabriel Rainwater, MD    Onset Date/Surgical Date  09/20/18    Hand Dominance  Right    Next MD Visit  12/08/2018    Prior Therapy  no      Precautions   Precautions  Other (comment)    Precaution Comments  Leota Sauers Adult PT Treatment/Exercise - 12/02/18 0001      Shoulder Exercises: Standing   Flexion  Strengthening;Left   x2 minutes with mug to middle and bottom shelf   ABduction  AROM;Left;Other (comment)   x2 minutes   Other Standing Exercises  Scapular retractions x2 mins      Shoulder Exercises: Pulleys   Flexion  5 minutes      Shoulder Exercises: ROM/Strengthening   UBE (Upper Arm Bike)  120 RPM, x8 mins 4 fwd, 4 bwd AAROM      Shoulder Exercises: Isometric Strengthening   Flexion  Other (  comment)   10"x 10   Extension  Other (comment)   10" x10   External Rotation  Other (comment)   10" x10   Internal Rotation  Other (comment)   10" x 10   ABduction  Other (comment)   10" x10                 PT Long Term Goals - 12/02/18 1702      PT LONG TERM GOAL #1   Title  Patient will be independent with HEP    Time  8    Period  Weeks    Status  On-going      PT LONG TERM GOAL #2   Title  Patient will demonstrate 145+ degrees of left shoulder flexion AROM to improve abiity to perform functional task    Time  8    Period  Weeks    Status  On-going      PT LONG TERM GOAL #3   Title  Patient will demonstrate 50+ degrees of left shoulder ER AROM to improve ability to don and doff apparel    Time  8    Period  Weeks    Status  On-going      PT LONG TERM GOAL #4   Title  Patient will demonstrate 4+/5 or greater left shoulder MMT in all planes to improve stability during functional tasks.    Time  8     Period  Weeks    Status  On-going      PT LONG TERM GOAL #5   Title  Patient will report ability to perform ADLs and home task with left upper arm pain less than or greater than 3/10    Time  8    Period  Weeks    Status  On-going            Plan - 12/02/18 1657    Clinical Impression Statement  Patient responded well to progression of exercises. Patient was able to demonstrate good form with all new TEs. Patient and PT discussed the progression of strengthening per MD. Patient reported understanding. Patient opted out of modalities to address strengthening exercises    Personal Factors and Comorbidities  Comorbidity 1    Comorbidities  left humerus fracture 09/20/2018, HTN, bilateral hip replacements,    Examination-Activity Limitations  Bathing;Bed Mobility;Carry;Dressing;Hygiene/Grooming;Lift    Examination-Participation Restrictions  Cleaning;Driving    Stability/Clinical Decision Making  Stable/Uncomplicated    Clinical Decision Making  Low    Rehab Potential  Good    PT Frequency  2x / week    PT Duration  8 weeks    PT Treatment/Interventions  Iontophoresis 4mg /ml Dexamethasone;ADLs/Self Care Home Management;Moist Heat;Electrical Stimulation;Cryotherapy;Neuromuscular re-education;Ultrasound;Therapeutic activities;Therapeutic exercise;Manual techniques;Vasopneumatic Device;Taping;Passive range of motion;Patient/family education    PT Next Visit Plan  slow progression of strengthening to left shoulder; modalities PRN for pain relief    PT Home Exercise Plan  Isometrics, AROM abduction, scapular retractions    Consulted and Agree with Plan of Care  Patient       Patient will benefit from skilled therapeutic intervention in order to improve the following deficits and impairments:  Pain, Impaired UE functional use, Decreased activity tolerance, Decreased strength, Increased edema, Decreased range of motion  Visit Diagnosis: Pain in left upper arm  Stiffness of left shoulder,  not elsewhere classified     Problem List Patient Active Problem List   Diagnosis Date Noted  . DVT (deep venous thrombosis) (H[facility]) 05/15/2016  .  Hypokalemia 03/17/2016  . 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  . QT prolongation 03/20/2015  . BMI 25.0-25.9,adult 01/28/2015  . Insomnia 02/05/2014  . Hemochromatosis 10/03/2013  . Alcohol abuse 07/24/2010  . Hypothyroidism 04/26/2010  . B12 deficiency 04/26/2010  . Hyperlipidemia 04/26/2010  . Gout 04/26/2010  . Essential hypertension 04/26/2010  . h/o VENTRICULAR TACHYCARDIA 04/26/2010    Gabriela Eves, PT, DPT 12/02/2018, 5:14 PM  [facility] [address] [place], [place], [address] Phone: [phone]   Fax:  [phone]  Name: Christian King MRN: [id number] Date of Birth: 1970/10/30

## 2018-12-04 ENCOUNTER — Encounter: Payer: BC Managed Care – PPO | Admitting: Physical Therapy

## 2018-12-08 ENCOUNTER — Encounter: Payer: Self-pay | Admitting: Physical Therapy

## 2018-12-08 ENCOUNTER — Other Ambulatory Visit: Payer: Self-pay

## 2018-12-08 ENCOUNTER — Ambulatory Visit: Payer: BC Managed Care – PPO | Admitting: Physical Therapy

## 2018-12-08 DIAGNOSIS — M25612 Stiffness of left shoulder, not elsewhere classified: Secondary | ICD-10-CM

## 2018-12-08 DIAGNOSIS — M79622 Pain in left upper arm: Secondary | ICD-10-CM

## 2018-12-08 NOTE — Therapy (Signed)
[facility] [address] [place], [place], [address] Phone: [phone]   Fax:  [phone]  Physical Therapy Treatment  Patient Details  Name: Christian King MRN: [id number] Date of Birth: [birth year] Referring Provider (PT): Gabriel Rainwateraniel Caffery, MD   Encounter Date: 12/08/2018  PT End of Session - 12/08/18 1358    Visit Number  7    Number of Visits  16    Date for PT Re-Evaluation  01/02/19    Authorization Type  FOTO; Progress note every 10th visit    PT Start Time  1345    PT Stop Time  1433    PT Time Calculation (min)  48 min    Activity Tolerance  Patient tolerated treatment well    Behavior During Therapy  [facility] for tasks assessed/performed       Past Medical History:  Diagnosis Date  . Alcohol abuse   . B12 deficiency   . Blood dyscrasia    hemachromatosis  . Bronchitis   . Degenerative joint disease (DJD) of hip   . GERD (gastroesophageal reflux disease)   . Gout   . History of kidney stones   . HLD (hyperlipidemia)   . HTN (hypertension)   . Hypothyroidism   . Ventricular tachycardia (HCC)    in setting of hypokalemia/ hypomagnesemia and ETOH  . Wears glasses     Past Surgical History:  Procedure Laterality Date  . TOTAL HIP ARTHROPLASTY Right   . TOTAL HIP ARTHROPLASTY Left 03/16/2016   Procedure: TOTAL HIP ARTHROPLASTY;  Surgeon: Frederico Hammanaffrey, Daniel, MD;  Location: [facility];  Service: Orthopedics;  Laterality: Left;  . vascularized fibular graft     right hip for avascular necrosis    There were no vitals filed for this visit.  Subjective Assessment - 12/08/18 1357    Subjective  COVID-19 screening performed upon arrival. Patient reported feeling alright and reported being compliant with HEP and has been starting to do them twice a day.    Pertinent History  left proximal fx 09/20/2018; bilateral hip replacements, HTN    Limitations  Lifting;House hold activities    Diagnostic tests  x:ray: nondisplaced spiral  fx of left humerus, see media    Patient Stated Goals  use arm normally    Currently in Pain?  Yes    Pain Score  2     Pain Location  Arm    Pain Orientation  Left    Pain Descriptors / Indicators  Tightness    Pain Type  Acute pain    Pain Onset  More than a month ago    Pain Frequency  Intermittent         [facility] PT Assessment - 12/08/18 0001      Assessment   Medical Diagnosis  left proximal humerus fracture    Referring Provider (PT)  Gabriel Rainwateraniel Caffery, MD    Onset Date/Surgical Date  09/20/18    Hand Dominance  Right    Next MD Visit  12/08/2018    Prior Therapy  no      Observation/Other Assessments   Focus on Therapeutic Outcomes (FOTO)   43% limitation                   [facility] Adult PT Treatment/Exercise - 12/08/18 0001      Exercises   Exercises  Shoulder      Shoulder Exercises: Pulleys   Flexion  5 minutes      Shoulder Exercises: ROM/Strengthening  UBE (Upper Arm Bike)  120 RPM, x8 mins 4 fwd, 4 bwd AAROM    Ranger  --    Wall Wash  flexion, cw, ccw, 2 mins each      Shoulder Exercises: Isometric Strengthening   Flexion  Other (comment)   10" x10   Extension  Other (comment)   10" x10   External Rotation  Other (comment)   10" x 10   Internal Rotation  Other (comment)   10x10"   ABduction  Other (comment)   10"x10     Modalities   Modalities  Electrical Stimulation;Moist Heat      Moist Heat Therapy   Number Minutes Moist Heat  10 Minutes    Moist Heat Location  Shoulder      Electrical Stimulation   Electrical Stimulation Location  left arm    Electrical Stimulation Action  IFC    Electrical Stimulation Parameters  80-150 hz x10 mins    Electrical Stimulation Goals  Pain                  PT Long Term Goals - 12/02/18 1702      PT LONG TERM GOAL #1   Title  Patient will be independent with HEP    Time  8    Period  Weeks    Status  On-going      PT LONG TERM GOAL #2   Title  Patient will demonstrate 145+  degrees of left shoulder flexion AROM to improve abiity to perform functional task    Time  8    Period  Weeks    Status  On-going      PT LONG TERM GOAL #3   Title  Patient will demonstrate 50+ degrees of left shoulder ER AROM to improve ability to don and doff apparel    Time  8    Period  Weeks    Status  On-going      PT LONG TERM GOAL #4   Title  Patient will demonstrate 4+/5 or greater left shoulder MMT in all planes to improve stability during functional tasks.    Time  8    Period  Weeks    Status  On-going      PT LONG TERM GOAL #5   Title  Patient will report ability to perform ADLs and home task with left upper arm pain less than or greater than 3/10    Time  8    Period  Weeks    Status  On-going            Plan - 12/08/18 1432    Clinical Impression Statement  Patient responded well to therapy session but with slight increase of tightness in left shoulder. Patient required cuing for form for isometrics to properly engage muscle. Patient instructed he can perform HEP more than 1x per day pending on his pain levels. Patient reported understanding. Patient's FOTO score improed to 43% limitation. No adverse affects noted upon removal of modalities.    Comorbidities  left humerus fracture 09/20/2018, HTN, bilateral hip replacements,    Examination-Activity Limitations  Bathing;Bed Mobility;Carry;Dressing;Hygiene/Grooming;Lift    Examination-Participation Restrictions  Cleaning;Driving    Stability/Clinical Decision Making  Stable/Uncomplicated    Clinical Decision Making  Low    Rehab Potential  Good    PT Frequency  2x / week    PT Duration  8 weeks    PT Treatment/Interventions  Iontophoresis 4mg /ml Dexamethasone;ADLs/Self Care Home Management;Moist Heat;Electrical Stimulation;Cryotherapy;Neuromuscular  re-education;Ultrasound;Therapeutic activities;Therapeutic exercise;Manual techniques;Vasopneumatic Device;Taping;Passive range of motion;Patient/family education    PT  Next Visit Plan  rockwood x4; AROM and strengthening of left shoulder, modalities PRN for pain.    Consulted and Agree with Plan of Care  Patient       Patient will benefit from skilled therapeutic intervention in order to improve the following deficits and impairments:  Pain, Impaired UE functional use, Decreased activity tolerance, Decreased strength, Increased edema, Decreased range of motion  Visit Diagnosis: Pain in left upper arm  Stiffness of left shoulder, not elsewhere classified     Problem List Patient Active Problem List   Diagnosis Date Noted  . DVT (deep venous thrombosis) ([place]) 05/15/2016  . Hypokalemia 03/17/2016  . 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  . QT prolongation 03/20/2015  . BMI 25.0-25.9,adult 01/28/2015  . Insomnia 02/05/2014  . Hemochromatosis 10/03/2013  . Alcohol abuse 07/24/2010  . Hypothyroidism 04/26/2010  . B12 deficiency 04/26/2010  . Hyperlipidemia 04/26/2010  . Gout 04/26/2010  . Essential hypertension 04/26/2010  . h/o VENTRICULAR TACHYCARDIA 04/26/2010    Gabriela Eves, PT, DPT  12/08/2018, 2:59 PM  [facility] [address] [place], [place], [address] Phone: [phone]   Fax:  [phone]  Name: Christian King MRN: [id number] Date of Birth: 1970-06-25

## 2018-12-11 ENCOUNTER — Encounter: Payer: BC Managed Care – PPO | Admitting: Physical Therapy

## 2018-12-15 ENCOUNTER — Encounter: Payer: Self-pay | Admitting: Physical Therapy

## 2018-12-15 ENCOUNTER — Ambulatory Visit: Payer: BC Managed Care – PPO | Admitting: Physical Therapy

## 2018-12-15 ENCOUNTER — Other Ambulatory Visit: Payer: Self-pay

## 2018-12-15 DIAGNOSIS — M79622 Pain in left upper arm: Secondary | ICD-10-CM

## 2018-12-15 DIAGNOSIS — M25612 Stiffness of left shoulder, not elsewhere classified: Secondary | ICD-10-CM

## 2018-12-15 NOTE — Therapy (Signed)
[facility] [address] [place], [place], [address] Phone: [phone]   Fax:  [phone]  Physical Therapy Treatment  Patient Details  Name: Christian King MRN: [id number] Date of Birth: [birth year] Referring Provider (PT): Gabriel Rainwateraniel Caffery, MD   Encounter Date: 12/15/2018  PT End of Session - 12/15/18 1310    Visit Number  8    Number of Visits  16    Date for PT Re-Evaluation  01/02/19    Authorization Type  FOTO; Progress note every 10th visit    PT Start Time  1300    PT Stop Time  1346    PT Time Calculation (min)  46 min    Activity Tolerance  Patient tolerated treatment well    Behavior During Therapy  [facility] for tasks assessed/performed       Past Medical History:  Diagnosis Date  . Alcohol abuse   . B12 deficiency   . Blood dyscrasia    hemachromatosis  . Bronchitis   . Degenerative joint disease (DJD) of hip   . GERD (gastroesophageal reflux disease)   . Gout   . History of kidney stones   . HLD (hyperlipidemia)   . HTN (hypertension)   . Hypothyroidism   . Ventricular tachycardia (HCC)    in setting of hypokalemia/ hypomagnesemia and ETOH  . Wears glasses     Past Surgical History:  Procedure Laterality Date  . TOTAL HIP ARTHROPLASTY Right   . TOTAL HIP ARTHROPLASTY Left 03/16/2016   Procedure: TOTAL HIP ARTHROPLASTY;  Surgeon: Frederico Hammanaffrey, Daniel, MD;  Location: [facility];  Service: Orthopedics;  Laterality: Left;  . vascularized fibular graft     right hip for avascular necrosis    There were no vitals filed for this visit.  Subjective Assessment - 12/15/18 1303    Subjective  COVID-19 screening performed upon arrival. Patient reported feeling like he may have overdone it due to feeling deep pain in his upper arm.    Pertinent History  left proximal fx 09/20/2018; bilateral hip replacements, HTN    Limitations  Lifting;House hold activities    Diagnostic tests  x:ray: nondisplaced spiral fx of left humerus,  see media    Patient Stated Goals  use arm normally    Currently in Pain?  Yes    Pain Score  3     Pain Location  Arm    Pain Orientation  Left    Pain Descriptors / Indicators  Tightness    Pain Type  Acute pain    Pain Onset  More than a month ago    Pain Frequency  Intermittent         [facility] PT Assessment - 12/15/18 0001      Assessment   Medical Diagnosis  left proximal humerus fracture    Referring Provider (PT)  Gabriel Rainwateraniel Caffery, MD    Onset Date/Surgical Date  09/20/18    Hand Dominance  Right    Next MD Visit  12/08/2018    Prior Therapy  no                   [facility] Adult PT Treatment/Exercise - 12/15/18 0001      Exercises   Exercises  Shoulder      Shoulder Exercises: Standing   Protraction  Strengthening;20 reps;Theraband;Left    Theraband Level (Shoulder Protraction)  Level 2 (Red)    External Rotation  Strengthening;20 reps;Theraband;Left    Theraband Level (Shoulder External Rotation)  Level 2 (  Red)    Internal Rotation  Strengthening;20 reps;Theraband;Left    Theraband Level (Shoulder Internal Rotation)  Level 2 (Red)    Flexion  Strengthening;Left;Other (comment)   x2 minutes from middle shelf to top shelf 1#   Shoulder Flexion Weight (lbs)  1    Extension  Strengthening;Left;20 reps;Theraband    Theraband Level (Shoulder Extension)  Level 2 (Red)    Row  Strengthening;Both;20 reps;Theraband    Theraband Level (Shoulder Row)  Level 2 (Red)    Other Standing Exercises  bent over row 1#, horizontal abd and extension x20 each      Shoulder Exercises: Pulleys   Flexion  5 minutes      Shoulder Exercises: ROM/Strengthening   UBE (Upper Arm Bike)  90 RPM x8 mins, 4 fwd, 4 bwd      Shoulder Exercises: Stretch   Corner Stretch  4 reps;30 seconds      Modalities   Modalities  --                  PT Long Term Goals - 12/02/18 1702      PT LONG TERM GOAL #1   Title  Patient will be independent with HEP    Time  8    Period   Weeks    Status  On-going      PT LONG TERM GOAL #2   Title  Patient will demonstrate 145+ degrees of left shoulder flexion AROM to improve abiity to perform functional task    Time  8    Period  Weeks    Status  On-going      PT LONG TERM GOAL #3   Title  Patient will demonstrate 50+ degrees of left shoulder ER AROM to improve ability to don and doff apparel    Time  8    Period  Weeks    Status  On-going      PT LONG TERM GOAL #4   Title  Patient will demonstrate 4+/5 or greater left shoulder MMT in all planes to improve stability during functional tasks.    Time  8    Period  Weeks    Status  On-going      PT LONG TERM GOAL #5   Title  Patient will report ability to perform ADLs and home task with left upper arm pain less than or greater than 3/10    Time  8    Period  Weeks    Status  On-going            Plan - 12/15/18 1356    Clinical Impression Statement  Patient responded well to progression of exercises and stretching. Patient provided with intermittent cuing for proper form with rockwood x4. Patient educated the importance of rest particularly with strengthening HEP but can frequently perform ROM exercises. Patient reported understanding. Patient opted out of modalities today.    Personal Factors and Comorbidities  Comorbidity 1    Comorbidities  left humerus fracture 09/20/2018, HTN, bilateral hip replacements,    Examination-Activity Limitations  Bathing;Bed Mobility;Carry;Dressing;Hygiene/Grooming;Lift    Examination-Participation Restrictions  Cleaning;Driving    Stability/Clinical Decision Making  Stable/Uncomplicated    Clinical Decision Making  Low    Rehab Potential  Good    PT Frequency  2x / week    PT Duration  8 weeks    PT Treatment/Interventions  Iontophoresis 4mg /ml Dexamethasone;ADLs/Self Care Home Management;Moist Heat;Electrical Stimulation;Cryotherapy;Neuromuscular re-education;Ultrasound;Therapeutic activities;Therapeutic exercise;Manual  techniques;Vasopneumatic Device;Taping;Passive range of motion;Patient/family education  PT Next Visit Plan  rockwood x4; IR stretching,  AROM and strengthening of left shoulder, modalities PRN for pain.    PT Home Exercise Plan  Isometrics, AROM abduction, scapular retractions    Consulted and Agree with Plan of Care  Patient       Patient will benefit from skilled therapeutic intervention in order to improve the following deficits and impairments:  Pain, Impaired UE functional use, Decreased activity tolerance, Decreased strength, Increased edema, Decreased range of motion  Visit Diagnosis: Pain in left upper arm  Stiffness of left shoulder, not elsewhere classified     Problem List Patient Active Problem List   Diagnosis Date Noted  . DVT (deep venous thrombosis) (HCC) 05/15/2016  . Hypokalemia 03/17/2016  . 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  . QT prolongation 03/20/2015  . BMI 25.0-25.9,adult 01/28/2015  . Insomnia 02/05/2014  . Hemochromatosis 10/03/2013  . Alcohol abuse 07/24/2010  . Hypothyroidism 04/26/2010  . B12 deficiency 04/26/2010  . Hyperlipidemia 04/26/2010  . Gout 04/26/2010  . Essential hypertension 04/26/2010  . h/o VENTRICULAR TACHYCARDIA 04/26/2010    Guss Bunde, PT, DPT 12/15/2018, 2:04 PM  [facility] [address] [place], [place], [address] Phone: [phone]   Fax:  [phone]  Name: Christian King MRN: [id number] Date of Birth: 09-02-1970

## 2018-12-18 ENCOUNTER — Encounter: Payer: Self-pay | Admitting: Physical Therapy

## 2018-12-18 ENCOUNTER — Other Ambulatory Visit: Payer: Self-pay

## 2018-12-18 ENCOUNTER — Ambulatory Visit: Payer: BC Managed Care – PPO | Admitting: Physical Therapy

## 2018-12-18 DIAGNOSIS — M79622 Pain in left upper arm: Secondary | ICD-10-CM | POA: Diagnosis not present

## 2018-12-18 DIAGNOSIS — M25612 Stiffness of left shoulder, not elsewhere classified: Secondary | ICD-10-CM

## 2018-12-18 NOTE — Therapy (Signed)
[facility] [place], [place], [address] Phone: [phone]   Fax:  [phone]  Physical Therapy Treatment  Patient Details  Name: Christian King MRN: [id number] Date of Birth: [birth year] Referring Provider (PT): Harriet Pho, MD   Encounter Date: 12/18/2018  PT End of Session - 12/18/18 1400    Visit Number  9    Number of Visits  16    Date for PT Re-Evaluation  01/02/19    Authorization Type  FOTO; Progress note every 10th visit    PT Start Time  1345    PT Stop Time  1436    PT Time Calculation (min)  51 min    Activity Tolerance  Patient tolerated treatment well    Behavior During Therapy  [facility] for tasks assessed/performed       Past Medical History:  Diagnosis Date  . Alcohol abuse   . B12 deficiency   . Blood dyscrasia    hemachromatosis  . Bronchitis   . Degenerative joint disease (DJD) of hip   . GERD (gastroesophageal reflux disease)   . Gout   . History of kidney stones   . HLD (hyperlipidemia)   . HTN (hypertension)   . Hypothyroidism   . Ventricular tachycardia ([place])    in setting of hypokalemia/ hypomagnesemia and ETOH  . Wears glasses     Past Surgical History:  Procedure Laterality Date  . TOTAL HIP ARTHROPLASTY Right   . TOTAL HIP ARTHROPLASTY Left 03/16/2016   Procedure: TOTAL HIP ARTHROPLASTY;  Surgeon: Earlie Server, MD;  Location: [place];  Service: Orthopedics;  Laterality: Left;  . vascularized fibular graft     right hip for avascular necrosis    There were no vitals filed for this visit.  Subjective Assessment - 12/18/18 1349    Subjective  COVID-19 screening performed upon arrival. Patient reported feeling tight and reports he can sleep on his left arm now without pain.    Pertinent History  left proximal fx 09/20/2018; bilateral hip replacements, HTN    Limitations  Lifting;House hold activities    Diagnostic tests  x:ray: nondisplaced spiral fx of left humerus, see  media    Patient Stated Goals  use arm normally    Currently in Pain?  No/denies         [facility] PT Assessment - 12/18/18 0001      Assessment   Medical Diagnosis  left proximal humerus fracture    Referring Provider (PT)  Harriet Pho, MD    Onset Date/Surgical Date  09/20/18    Hand Dominance  Right    Next MD Visit  12/31/2018    Prior Therapy  no                   [facility] Adult PT Treatment/Exercise - 12/18/18 0001      Exercises   Exercises  Shoulder      Shoulder Exercises: Standing   Protraction  Strengthening;20 reps;Theraband;Left    Theraband Level (Shoulder Protraction)  Level 2 (Red)    External Rotation  Strengthening;20 reps;Theraband;Left    Theraband Level (Shoulder External Rotation)  Level 2 (Red)    Internal Rotation  Strengthening;20 reps;Theraband;Left    Theraband Level (Shoulder Internal Rotation)  Level 3 (Green)    Flexion  Strengthening;Left;Other (comment);20 reps   2# to middle and top shelf    Shoulder Flexion Weight (lbs)  2    Extension  Strengthening;Left;20 reps;Theraband    Theraband Level (Shoulder  Extension)  Level 3 (Green)    Row  Strengthening;Both;20 reps;Theraband    Theraband Level (Shoulder Row)  Level 3 (Green)    Other Standing Exercises  --      Shoulder Exercises: Pulleys   Flexion  5 minutes      Shoulder Exercises: ROM/Strengthening   UBE (Upper Arm Bike)  90 RPM x8 mins, 4 fwd, 4 bwd    Ranger  standing UE ranger flexion, CW, CCW, x3 minutes each    Other ROM/Strengthening Exercises  bicep curls green theraband x20      Modalities   Modalities  Electrical Stimulation;Vasopneumatic      Electrical Stimulation   Electrical Stimulation Location  left arm    Electrical Stimulation Action  IFC    Electrical Stimulation Parameters  80-150 hz x10 mins    Electrical Stimulation Goals  Pain      Vasopneumatic   Number Minutes Vasopneumatic   10 minutes    Vasopnuematic Location   Shoulder    Vasopneumatic  Pressure  Low    Vasopneumatic Temperature   34                  PT Long Term Goals - 12/02/18 1702      PT LONG TERM GOAL #1   Title  Patient will be independent with HEP    Time  8    Period  Weeks    Status  On-going      PT LONG TERM GOAL #2   Title  Patient will demonstrate 145+ degrees of left shoulder flexion AROM to improve abiity to perform functional task    Time  8    Period  Weeks    Status  On-going      PT LONG TERM GOAL #3   Title  Patient will demonstrate 50+ degrees of left shoulder ER AROM to improve ability to don and doff apparel    Time  8    Period  Weeks    Status  On-going      PT LONG TERM GOAL #4   Title  Patient will demonstrate 4+/5 or greater left shoulder MMT in all planes to improve stability during functional tasks.    Time  8    Period  Weeks    Status  On-going      PT LONG TERM GOAL #5   Title  Patient will report ability to perform ADLs and home task with left upper arm pain less than or greater than 3/10    Time  8    Period  Weeks    Status  On-going            Plan - 12/18/18 1443    Clinical Impression Statement  Patient responded well to progression of exercises but patient still limited with strength with ER with green TB. Patient was able to perform full range with red theraband with no compensatory movements. Patient educated he can perform all reps without rest breaks as long as he maintains proper form and technique for HEP. Vasopneumatic device initiated today with e-stim with no adverse affects upon removal.    Personal Factors and Comorbidities  Comorbidity 1    Comorbidities  left humerus fracture 09/20/2018, HTN, bilateral hip replacements,    Examination-Activity Limitations  Bathing;Bed Mobility;Carry;Dressing;Hygiene/Grooming;Lift    Examination-Participation Restrictions  Cleaning;Driving    Stability/Clinical Decision Making  Stable/Uncomplicated    Clinical Decision Making  Low    Rehab Potential   Good  PT Frequency  2x / week    PT Duration  8 weeks    PT Treatment/Interventions  Iontophoresis 4mg /ml Dexamethasone;ADLs/Self Care Home Management;Moist Heat;Electrical Stimulation;Cryotherapy;Neuromuscular re-education;Ultrasound;Therapeutic activities;Therapeutic exercise;Manual techniques;Vasopneumatic Device;Taping;Passive range of motion;Patient/family education    PT Next Visit Plan  FOTO; Progress note; rockwood x4; IR stretching,  AROM and strengthening of left shoulder, modalities PRN for pain.    PT Home Exercise Plan  Isometrics, AROM abduction, scapular retractions    Consulted and Agree with Plan of Care  Patient       Patient will benefit from skilled therapeutic intervention in order to improve the following deficits and impairments:  Pain, Impaired UE functional use, Decreased activity tolerance, Decreased strength, Increased edema, Decreased range of motion  Visit Diagnosis: Pain in left upper arm  Stiffness of left shoulder, not elsewhere classified     Problem List Patient Active Problem List   Diagnosis Date Noted  . DVT (deep venous thrombosis) ([facility]) 05/15/2016  . Hypokalemia 03/17/2016  . 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  . QT prolongation 03/20/2015  . BMI 25.0-25.9,adult 01/28/2015  . Insomnia 02/05/2014  . Hemochromatosis 10/03/2013  . Alcohol abuse 07/24/2010  . Hypothyroidism 04/26/2010  . B12 deficiency 04/26/2010  . Hyperlipidemia 04/26/2010  . Gout 04/26/2010  . Essential hypertension 04/26/2010  . h/o VENTRICULAR TACHYCARDIA 04/26/2010    06/26/2010, PT, DPT 12/18/2018, 2:55 PM  [facility] [address] [place], [place], [place] Phone: [phone]   Fax:  [phone]  Name: Christian King MRN: [name] Date of Birth: Dec 14, 1970

## 2018-12-20 ENCOUNTER — Other Ambulatory Visit: Payer: Self-pay | Admitting: Nurse Practitioner

## 2018-12-20 DIAGNOSIS — E876 Hypokalemia: Secondary | ICD-10-CM

## 2018-12-20 DIAGNOSIS — I10 Essential (primary) hypertension: Secondary | ICD-10-CM

## 2018-12-22 ENCOUNTER — Other Ambulatory Visit: Payer: Self-pay

## 2018-12-22 ENCOUNTER — Encounter: Payer: Self-pay | Admitting: Physical Therapy

## 2018-12-22 ENCOUNTER — Ambulatory Visit: Payer: BC Managed Care – PPO | Attending: Orthopedic Surgery | Admitting: Physical Therapy

## 2018-12-22 DIAGNOSIS — M25612 Stiffness of left shoulder, not elsewhere classified: Secondary | ICD-10-CM | POA: Diagnosis present

## 2018-12-22 DIAGNOSIS — M79622 Pain in left upper arm: Secondary | ICD-10-CM | POA: Diagnosis present

## 2018-12-22 NOTE — Therapy (Signed)
[facility] [address] [place], [place], [address] Phone: [phone]   Fax:  [phone]  Physical Therapy Treatment Progress Note Reporting Period 10/31/2018 to 01/21/2019  See note below for Objective Data and Assessment of Progress/Goals. Patient is responding well to therapy and is compliant with HEP. Guss BundeKrystle Annastacia Duba, PT, DPT      Patient Details  Name: Christian King MRN: [id number] Date of Birth: [birth year] Referring Provider (PT): Gabriel Rainwateraniel Caffery, MD   Encounter Date: 12/22/2018  PT End of Session - 12/22/18 1452    Visit Number  10    Number of Visits  16    Date for PT Re-Evaluation  01/02/19    Authorization Type  FOTO; Progress note every 10th visit    PT Start Time  1345    PT Stop Time  1431    PT Time Calculation (min)  46 min    Activity Tolerance  Patient tolerated treatment well    Behavior During Therapy  [facility] for tasks assessed/performed       Past Medical History:  Diagnosis Date  . Alcohol abuse   . B12 deficiency   . Blood dyscrasia    hemachromatosis  . Bronchitis   . Degenerative joint disease (DJD) of hip   . GERD (gastroesophageal reflux disease)   . Gout   . History of kidney stones   . HLD (hyperlipidemia)   . HTN (hypertension)   . Hypothyroidism   . Ventricular tachycardia (HCC)    in setting of hypokalemia/ hypomagnesemia and ETOH  . Wears glasses     Past Surgical History:  Procedure Laterality Date  . TOTAL HIP ARTHROPLASTY Right   . TOTAL HIP ARTHROPLASTY Left 03/16/2016   Procedure: TOTAL HIP ARTHROPLASTY;  Surgeon: Frederico Hammanaffrey, Daniel, MD;  Location: [facility];  Service: Orthopedics;  Laterality: Left;  . vascularized fibular graft     right hip for avascular necrosis    There were no vitals filed for this visit.  Subjective Assessment - 12/22/18 1451    Subjective  COVID-19 screening performed upon arrival. Patient reports yesterday wa the first day his arm was feeling normal.     Pertinent History  left proximal fx 09/20/2018; bilateral hip replacements, HTN    Limitations  Lifting;House hold activities    Diagnostic tests  x:ray: nondisplaced spiral fx of left humerus, see media    Patient Stated Goals  use arm normally    Currently in Pain?  No/denies         [facility] - 12/22/18 0001      Assessment   Medical Diagnosis  left proximal humerus fracture    Referring Provider (PT)  Gabriel Rainwateraniel Caffery, MD    Onset Date/Surgical Date  09/20/18    Hand Dominance  Right    Next MD Visit  12/31/2018    Prior Therapy  no                   [facility] Adult PT Treatment/Exercise - 12/22/18 0001      Exercises   Exercises  Shoulder      Shoulder Exercises: Standing   Protraction  Strengthening;20 reps;Left;10 reps;Theraband    Theraband Level (Shoulder Protraction)  Level 3 (Green)    External Rotation  Strengthening;20 reps;Theraband;Left    Theraband Level (Shoulder External Rotation)  Level 2 (Red)    Internal Rotation  Strengthening;20 reps;Theraband;Left    Theraband Level (Shoulder Internal Rotation)  Level 3 (Green)    Flexion  Strengthening;Left;Other (comment);20 reps    Shoulder Flexion Weight (lbs)  3    Flexion Limitations  to top and middle shelf    ABduction  AROM;Left;Other (comment)    ABduction Limitations  scaption    Extension  Strengthening;Left;20 reps;Theraband;10 reps    Theraband Level (Shoulder Extension)  Level 3 (Green)    Row  Strengthening;Both;20 reps;Theraband;10 reps    Theraband Level (Shoulder Row)  Level 3 (Green)      Shoulder Exercises: Pulleys   Flexion  5 minutes      Shoulder Exercises: ROM/Strengthening   UBE (Upper Arm Bike)  60 RPM x8 mins, 4 fwd, 4 bwd      Shoulder Exercises: Stretch   Corner Stretch  4 reps;30 seconds    Cross Chest Stretch  4 reps;30 seconds                  PT Long Term Goals - 12/22/18 1453      PT LONG TERM GOAL #1   Title  Patient will be independent  with HEP    Time  8    Period  Weeks    Status  Achieved      PT LONG TERM GOAL #2   Title  Patient will demonstrate 145+ degrees of left shoulder flexion AROM to improve abiity to perform functional task    Time  8    Period  Weeks    Status  On-going      PT LONG TERM GOAL #3   Title  Patient will demonstrate 50+ degrees of left shoulder ER AROM to improve ability to don and doff apparel    Time  8    Period  Weeks    Status  On-going      PT LONG TERM GOAL #4   Title  Patient will demonstrate 4+/5 or greater left shoulder MMT in all planes to improve stability during functional tasks.    Time  8    Period  Weeks    Status  On-going      PT LONG TERM GOAL #5   Title  Patient will report ability to perform ADLs and home task with left upper arm pain less than or greater than 3/10    Time  8    Period  Weeks    Status  Achieved            Plan - 12/22/18 1453    Clinical Impression Statement  Patient responded well to therapy session with no reports of increased pain or fatigue. Patient was concerned about a hard "spot" on his arm to which patient was educated on phases of healing for bone breaks Patient required intermittent cuing for ER technique. No modalities per request.    Personal Factors and Comorbidities  Comorbidity 1    Comorbidities  left humerus fracture 09/20/2018, HTN, bilateral hip replacements,    Examination-Activity Limitations  Bathing;Bed Mobility;Carry;Dressing;Hygiene/Grooming;Lift    Examination-Participation Restrictions  Cleaning;Driving    Stability/Clinical Decision Making  Stable/Uncomplicated    Clinical Decision Making  Low    Rehab Potential  Good    PT Frequency  2x / week    PT Duration  8 weeks    PT Treatment/Interventions  Iontophoresis 4mg /ml Dexamethasone;ADLs/Self Care Home Management;Moist Heat;Electrical Stimulation;Cryotherapy;Neuromuscular re-education;Ultrasound;Therapeutic activities;Therapeutic exercise;Manual  techniques;Vasopneumatic Device;Taping;Passive range of motion;Patient/family education    PT Next Visit Plan  FOTO; Progress note; rockwood x4; IR stretching,  AROM and strengthening of left shoulder, modalities PRN for pain.  Consulted and Agree with Plan of Care  Patient       Patient will benefit from skilled therapeutic intervention in order to improve the following deficits and impairments:  Pain, Impaired UE functional use, Decreased activity tolerance, Decreased strength, Increased edema, Decreased range of motion  Visit Diagnosis: Pain in left upper arm  Stiffness of left shoulder, not elsewhere classified     Problem List Patient Active Problem List   Diagnosis Date Noted  . DVT (deep venous thrombosis) ([place]) 05/15/2016  . Hypokalemia 03/17/2016  . 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  . QT prolongation 03/20/2015  . BMI 25.0-25.9,adult 01/28/2015  . Insomnia 02/05/2014  . Hemochromatosis 10/03/2013  . Alcohol abuse 07/24/2010  . Hypothyroidism 04/26/2010  . B12 deficiency 04/26/2010  . Hyperlipidemia 04/26/2010  . Gout 04/26/2010  . Essential hypertension 04/26/2010  . h/o VENTRICULAR TACHYCARDIA 04/26/2010    Gabriela Eves, PT, DPT 12/22/2018, 2:59 PM  [facility] [address] [place], [place], [address] Phone: [phone]   Fax:  [phone]  Name: Christian King MRN: [id number] Date of Birth: 10-20-1970

## 2018-12-23 ENCOUNTER — Other Ambulatory Visit: Payer: Self-pay | Admitting: *Deleted

## 2018-12-23 DIAGNOSIS — I10 Essential (primary) hypertension: Secondary | ICD-10-CM

## 2018-12-23 MED ORDER — METOPROLOL SUCCINATE ER 50 MG PO TB24: ORAL_TABLET | ORAL | 0 refills | Status: DC

## 2018-12-25 ENCOUNTER — Encounter: Payer: BC Managed Care – PPO | Admitting: Physical Therapy

## 2018-12-30 ENCOUNTER — Ambulatory Visit: Payer: BC Managed Care – PPO | Admitting: Physical Therapy

## 2018-12-30 ENCOUNTER — Other Ambulatory Visit: Payer: Self-pay

## 2018-12-30 ENCOUNTER — Encounter: Payer: Self-pay | Admitting: Physical Therapy

## 2018-12-30 DIAGNOSIS — M79622 Pain in left upper arm: Secondary | ICD-10-CM | POA: Diagnosis not present

## 2018-12-30 DIAGNOSIS — M25612 Stiffness of left shoulder, not elsewhere classified: Secondary | ICD-10-CM

## 2018-12-30 NOTE — Therapy (Addendum)
[facility] [place], [place], [address] Phone: [phone]   Fax:  [phone]  Physical Therapy Treatment PHYSICAL THERAPY DISCHARGE SUMMARY  Visits from Start of Care: 11  Current functional level related to goals / functional outcomes: See below   Remaining deficits: See goals   Education / Equipment: HEP Plan: Patient agrees to discharge.  Patient goals were partially met. Patient is being discharged due to not returning since the last visit.  ?????  Gabriela Eves, PT, DPT 04/09/19    Patient Details  Name: Christian King MRN: [id number] Date of Birth: 01-30-1971 Referring Provider (PT): Harriet Pho, MD   Encounter Date: 12/30/2018  PT End of Session - 12/30/18 1354    Visit Number  11    Number of Visits  16    Date for PT Re-Evaluation  01/02/19    Authorization Type  FOTO; Progress note every 10th visit    PT Start Time  1345    PT Stop Time  1431    PT Time Calculation (min)  46 min    Activity Tolerance  Patient tolerated treatment well    Behavior During Therapy  [facility] for tasks assessed/performed       Past Medical History:  Diagnosis Date  . Alcohol abuse   . B12 deficiency   . Blood dyscrasia    hemachromatosis  . Bronchitis   . Degenerative joint disease (DJD) of hip   . GERD (gastroesophageal reflux disease)   . Gout   . History of kidney stones   . HLD (hyperlipidemia)   . HTN (hypertension)   . Hypothyroidism   . Ventricular tachycardia ([place])    in setting of hypokalemia/ hypomagnesemia and ETOH  . Wears glasses     Past Surgical History:  Procedure Laterality Date  . TOTAL HIP ARTHROPLASTY Right   . TOTAL HIP ARTHROPLASTY Left 03/16/2016   Procedure: TOTAL HIP ARTHROPLASTY;  Surgeon: Earlie Server, MD;  Location[facility];  Service: Orthopedics;  Laterality: Left;  . vascularized fibular graft     right hip for avascular necrosis    There were no vitals filed for  this visit.  Subjective Assessment - 12/30/18 1353    Subjective  COVID-19 screening performed upon arrival. Reports the doctor released him and it was up to him and the PT to determine what to do about the visits.    Pertinent History  left proximal fx 09/20/2018; bilateral hip replacements, HTN    Limitations  Lifting;House hold activities    Diagnostic tests  x:ray: nondisplaced spiral fx of left humerus, see media    Patient Stated Goals  use arm normally    Currently in Pain?  No/denies         [facility] PT Assessment - 12/30/18 0001      Assessment   Medical Diagnosis  left proximal humerus fracture    Referring Provider (PT)  Harriet Pho, MD    Onset Date/Surgical Date  09/20/18    Hand Dominance  Right    Next MD Visit  released    Prior Therapy  no                   [facility] Adult PT Treatment/Exercise - 12/30/18 0001      Exercises   Exercises  Shoulder      Shoulder Exercises: Standing   Flexion  Strengthening;Left;Other (comment);20 reps    Shoulder Flexion Weight (lbs)  5    Flexion Limitations  to 90 degrees    ABduction  AROM;Left;Other (comment)    ABduction Limitations  scaption no weight    Other Standing Exercises  bent over row 5#, horizontal abd and extension x20 each      Shoulder Exercises: ROM/Strengthening   UBE (Upper Arm Bike)  60 RPM x10 mins      Modalities   Modalities  Electrical Stimulation;Vasopneumatic      Electrical Stimulation   Electrical Stimulation Location  left arm    Electrical Stimulation Action  IFC    Electrical Stimulation Parameters  80-150 hz x10 mins    Electrical Stimulation Goals  Pain      Vasopneumatic   Number Minutes Vasopneumatic   10 minutes    Vasopnuematic Location   Shoulder    Vasopneumatic Pressure  Low    Vasopneumatic Temperature   34                  PT Long Term Goals - 12/22/18 1453      PT LONG TERM GOAL #1   Title  Patient will be independent with HEP    Time  8     Period  Weeks    Status  Achieved      PT LONG TERM GOAL #2   Title  Patient will demonstrate 145+ degrees of left shoulder flexion AROM to improve abiity to perform functional task    Time  8    Period  Weeks    Status  On-going      PT LONG TERM GOAL #3   Title  Patient will demonstrate 50+ degrees of left shoulder ER AROM to improve ability to don and doff apparel    Time  8    Period  Weeks    Status  On-going      PT LONG TERM GOAL #4   Title  Patient will demonstrate 4+/5 or greater left shoulder MMT in all planes to improve stability during functional tasks.    Time  8    Period  Weeks    Status  On-going      PT LONG TERM GOAL #5   Title  Patient will report ability to perform ADLs and home task with left upper arm pain less than or greater than 3/10    Time  8    Period  Weeks    Status  Achieved            Plan - 12/30/18 1536    Clinical Impression Statement  Patient responded well to therapy with progression of strengthening exercises. Patient noted with muscle fasciulations with forward flexion with 5# weight and was cued to prevent shoulder hiking. Patient and PT discussed continuing for strengthening for remaining month then return to work. Patient reported agreement. Patient noted with normal responses upon removal of modalities.    Personal Factors and Comorbidities  Comorbidity 1    Comorbidities  left humerus fracture 09/20/2018, HTN, bilateral hip replacements,    Examination-Activity Limitations  Bathing;Bed Mobility;Carry;Dressing;Hygiene/Grooming;Lift    Examination-Participation Restrictions  Cleaning;Driving    Stability/Clinical Decision Making  Stable/Uncomplicated    Clinical Decision Making  Low    Rehab Potential  Good    PT Frequency  2x / week    PT Duration  8 weeks    PT Treatment/Interventions  Iontophoresis 72m/ml Dexamethasone;ADLs/Self Care Home Management;Moist Heat;Electrical Stimulation;Cryotherapy;Neuromuscular  re-education;Ultrasound;Therapeutic activities;Therapeutic exercise;Manual techniques;Vasopneumatic Device;Taping;Passive range of motion;Patient/family education    PT Next Visit Plan  rockwood x4;  IR stretching,  AROM and strengthening of left shoulder, modalities PRN for pain.    Consulted and Agree with Plan of Care  Patient       Patient will benefit from skilled therapeutic intervention in order to improve the following deficits and impairments:  Pain, Impaired UE functional use, Decreased activity tolerance, Decreased strength, Increased edema, Decreased range of motion  Visit Diagnosis: Pain in left upper arm  Stiffness of left shoulder, not elsewhere classified     Problem List Patient Active Problem List   Diagnosis Date Noted  . DVT (deep venous thrombosis) ([place]) 05/15/2016  . Hypokalemia 03/17/2016  . 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  . QT prolongation 03/20/2015  . BMI 25.0-25.9,adult 01/28/2015  . Insomnia 02/05/2014  . Hemochromatosis 10/03/2013  . Alcohol abuse 07/24/2010  . Hypothyroidism 04/26/2010  . B12 deficiency 04/26/2010  . Hyperlipidemia 04/26/2010  . Gout 04/26/2010  . Essential hypertension 04/26/2010  . h/o VENTRICULAR TACHYCARDIA 04/26/2010    Gabriela Eves, PT, DPT 12/30/2018, 3:46 PM  [facility] [address] [place], [place], [address] Phone: [phone]   Fax:  [phone]  Name: Christian King MRN: [id number] Date of Birth: Jul 30, 1970

## 2019-01-01 ENCOUNTER — Encounter: Payer: BC Managed Care – PPO | Admitting: Physical Therapy

## 2019-01-06 ENCOUNTER — Encounter: Payer: Self-pay | Admitting: Nurse Practitioner

## 2019-01-06 ENCOUNTER — Other Ambulatory Visit: Payer: Self-pay

## 2019-01-06 ENCOUNTER — Ambulatory Visit: Payer: BC Managed Care – PPO | Admitting: Physical Therapy

## 2019-01-06 ENCOUNTER — Ambulatory Visit (INDEPENDENT_AMBULATORY_CARE_PROVIDER_SITE_OTHER): Payer: BC Managed Care – PPO | Admitting: Nurse Practitioner

## 2019-01-06 DIAGNOSIS — F1010 Alcohol abuse, uncomplicated: Secondary | ICD-10-CM

## 2019-01-06 DIAGNOSIS — M1A9XX0 Chronic gout, unspecified, without tophus (tophi): Secondary | ICD-10-CM

## 2019-01-06 DIAGNOSIS — G4700 Insomnia, unspecified: Secondary | ICD-10-CM | POA: Diagnosis not present

## 2019-01-06 DIAGNOSIS — E785 Hyperlipidemia, unspecified: Secondary | ICD-10-CM | POA: Diagnosis not present

## 2019-01-06 DIAGNOSIS — E876 Hypokalemia: Secondary | ICD-10-CM | POA: Diagnosis not present

## 2019-01-06 DIAGNOSIS — E039 Hypothyroidism, unspecified: Secondary | ICD-10-CM

## 2019-01-06 DIAGNOSIS — I10 Essential (primary) hypertension: Secondary | ICD-10-CM | POA: Diagnosis not present

## 2019-01-06 DIAGNOSIS — J40 Bronchitis, not specified as acute or chronic: Secondary | ICD-10-CM

## 2019-01-06 DIAGNOSIS — I82409 Acute embolism and thrombosis of unspecified deep veins of unspecified lower extremity: Secondary | ICD-10-CM

## 2019-01-06 DIAGNOSIS — Z6825 Body mass index (BMI) 25.0-25.9, adult: Secondary | ICD-10-CM

## 2019-01-06 MED ORDER — SIMVASTATIN 40 MG PO TABS: 40.0000 mg | ORAL_TABLET | Freq: Every day | ORAL | 1 refills | Status: DC

## 2019-01-06 MED ORDER — FENOFIBRATE 54 MG PO TABS: ORAL_TABLET | ORAL | 1 refills | Status: DC

## 2019-01-06 MED ORDER — AMOXICILLIN 875 MG PO TABS: 875.0000 mg | ORAL_TABLET | Freq: Two times a day (BID) | ORAL | 0 refills | Status: DC

## 2019-01-06 MED ORDER — BENZONATATE 100 MG PO CAPS: 100.0000 mg | ORAL_CAPSULE | Freq: Three times a day (TID) | ORAL | 0 refills | Status: DC | PRN

## 2019-01-06 MED ORDER — POTASSIUM CHLORIDE CRYS ER 10 MEQ PO TBCR: EXTENDED_RELEASE_TABLET | ORAL | 1 refills | Status: DC

## 2019-01-06 MED ORDER — SILDENAFIL CITRATE 20 MG PO TABS: ORAL_TABLET | ORAL | 3 refills | Status: DC

## 2019-01-06 MED ORDER — METOPROLOL SUCCINATE ER 50 MG PO TB24: ORAL_TABLET | ORAL | 1 refills | Status: DC

## 2019-01-06 NOTE — Progress Notes (Signed)
Virtual Visit via telephone Note Due to COVID-19 pandemic this visit was conducted virtually. This visit type was conducted due to national recommendations for restrictions regarding the COVID-19 Pandemic (e.g. social distancing, sheltering in place) in an effort to limit this patient's exposure and mitigate transmission in our community. All issues noted in this document were discussed and addressed.  A physical exam was not performed with this format.  I connected with Christian King on 01/06/19 at 10:05 by telephone and verified that I am speaking with the correct person using two identifiers. Christian King is currently located at home and no one is currently with her during visit. The provider, Christian Hassell Done, FNP is located in their office at time of visit.  I discussed the limitations, risks, security and privacy concerns of performing an evaluation and management service by telephone and the availability of in person appointments. I also discussed with the patient that there may be a patient responsible charge related to this service. The patient expressed understanding and agreed to proceed.   History and Present Illness:   Chief Complaint: Medical Management of Chronic Issues    HPI:  1. Essential hypertension n c/o chest pain, sob or headache. Does not check blood pressure at home. BP Readings from Last 3 Encounters:  09/22/18 133/85  06/30/18 (!) 149/97  04/09/17 116/76     2. Hyperlipidemia, unspecified hyperlipidemia type Does not watch diet and doe no exercise. Lab Results  Component Value Date   CHOL 118 06/30/2018   HDL 43 06/30/2018   LDLCALC 33 06/30/2018   TRIG 208 (H) 06/30/2018   CHOLHDL 2.7 06/30/2018     3. 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  4. Hypokalemia Is on a daily potassium supplement  5. Hereditary hemochromatosis ([place]) He says he is having no problems. He has not had  labs King. Lab Results  Component Value Date   FERRITIN 349 04/09/2017     6. Chronic gout without tophus, unspecified cause, unspecified site No recent flare ups  7. Acquired hypothyroidism No problems that he is aware of  8. 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  9. BMI 25.0-25.9,adult No recent weight changes. Wt Readings from Last 3 Encounters:  06/30/18 170 lb (77.1 kg)  04/09/17 172 lb (78 kg)  11/08/16 170 lb (77.1 kg)     10. Alcohol abuse Denies any alcohol during the week but does drink when he is at [place].    Outpatient Encounter Medications as of 01/06/2019  Medication Sig  . aspirin 325 MG tablet Take 325 mg by mouth daily.  . fenofibrate 54 MG tablet Take 1 tablet by mouth  daily  . magnesium oxide (MAG-OX) 400 MG tablet Take 1 tablet (400 mg total) by mouth daily.  . metoprolol succinate (TOPROL-XL) 50 MG 24 hr tablet Take with or immediately following a meal  . potassium chloride (KLOR-CON M10) 10 MEQ tablet TAKE 1 TABLET BY MOUTH EVERY DAY (Needs to be seen before next refill)  . sildenafil (REVATIO) 20 MG tablet TAKE TWO TO FIVE TABLETS BY MOUTH AS NEEDED FOR SEX  . simvastatin (ZOCOR) 40 MG tablet Take 1 tablet (40 mg total) by mouth daily.     Past Surgical History:  Procedure Laterality Date  . TOTAL HIP ARTHROPLASTY Right   . TOTAL HIP ARTHROPLASTY Left  03/16/2016   Procedure: TOTAL HIP ARTHROPLASTY;  Surgeon: Frederico Hammanaffrey, Daniel, MD;  [facility];  Service: Orthopedics;  Laterality: Left;  . vascularized fibular graft     right hip for avascular necrosis    Family History  Problem Relation Age of Onset  . Hypertension Father   . Diabetes Maternal Grandmother   . Hypertension Paternal Grandmother   . Stroke Paternal Grandmother     New complaints: Has a chronic cough. Has had for several weeks. Is worsening. 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  Controlled substance contract: n/a    Review of Systems  Constitutional: Negative for diaphoresis and weight loss.  Eyes: Negative for blurred vision, double vision and pain.  Respiratory: Positive for cough. Negative for shortness of breath.   Cardiovascular: Negative for chest pain, palpitations, orthopnea and leg swelling.  Gastrointestinal: Negative for abdominal pain.  Skin: Negative for rash.  Neurological: Negative for dizziness, sensory change, loss of consciousness, weakness and headaches.  Endo/Heme/Allergies: Negative for polydipsia. Does not bruise/bleed easily.  Psychiatric/Behavioral: Negative for memory loss. The patient does not have insomnia.   All other systems reviewed and are negative.    Observations/Objective: Alert and oriented- answers all questions appropriately No distress Dry cough   Assessment and Plan: Christian King comes in today with chief complaint of Medical Management of Chronic Issues   Diagnosis and orders addressed:  1. Essential hypertension Low sodium diet - metoprolol succinate (TOPROL-XL) 50 MG 24 hr tablet; Take with or immediately following a meal  Dispense: 90 tablet; Refill: 1  2. Hyperlipidemia, unspecified hyperlipidemia type Low fat diet - simvastatin (ZOCOR) 40 MG tablet; Take 1 tablet (40 mg total) by mouth daily.  Dispense: 90 tablet; Refill: 1 - fenofibrate 54 MG tablet; Take 1 tablet by mouth  daily  Dispense: 90 tablet; Refill: 1  3. Insomnia, unspecified type Bedtime routine  4. Hypokalemia Continue daily dose of potassium - potassium chloride (KLOR-CON M10) 10 MEQ tablet; TAKE 1 TABLET BY MOUTH EVERY DAY (Needs to be seen before next refill)  Dispense: 90 tablet; Refill: 1  5. Hereditary hemochromatosis (HCC) Let us now if become SOB  6. Chronic gout without tophus, unspecified cause, unspecified site Low purine diet  7. Acquired  hypothyroidism  8. Acute deep vein thrombosis (DVT) of lower extremity, unspecified laterality, unspecified vein (HCC) Make sure walk around 2-3 x a day when out on road  9. BMI 25.0-25.9,adult Discussed diet and exercise for person with BMI >25 Will recheck weight in 3-6 months  10. Alcohol abuse Avoid alcohol use  11. Bronchitis Amoxicillin 875mg  BID x 10 days  Tessalon perles TID  1. Take meds as prescribed 2. Use a cool mist humidifier especially during the winter months and when heat has been humid. 3. Use saline nose sprays frequently 4. Saline irrigations of the nose can be very helpful if King frequently.  * 4X daily for 1 week*  * Use of a nettie pot can be helpful with this. Follow directions with this* 5. Drink plenty of fluids 6. Keep thermostat turn down low 7.For any cough or congestion- tessalon perles 8. For fever or aces or pains- take tylenol or ibuprofen appropriate for age and weight.  * for fevers greater than 101 orally you may alternate ibuprofen and tylenol every  3 hours.      Previous labs reviewed Health Maintenance reviewed Diet and exercise encouraged  Follow up plan: 3  months     I discussed the assessment and treatment plan with the patient. The patient was provided an opportunity to ask questions and all were answered. The patient agreed with the plan and demonstrated an understanding of the instructions.   The patient was advised to call back or seek an in-person evaluation if the symptoms worsen or if the condition fails to improve as anticipated.  The above assessment and management plan was discussed with the patient. The patient verbalized understanding of and has agreed to the management plan. Patient is aware to call the clinic if symptoms persist or worsen. Patient is aware when to return to the clinic for a follow-up visit. Patient educated on when it is appropriate to go to the emergency department.   Time call ended:  10:20  I  provided 15 minutes of non-face-to-face time during this encounter.    Christian Daphine Deutscher, FNP

## 2019-01-07 ENCOUNTER — Telehealth: Payer: Self-pay | Admitting: *Deleted

## 2019-01-07 ENCOUNTER — Other Ambulatory Visit: Payer: Self-pay | Admitting: Family Medicine

## 2019-01-07 DIAGNOSIS — J40 Bronchitis, not specified as acute or chronic: Secondary | ICD-10-CM

## 2019-01-07 MED ORDER — DOXYCYCLINE HYCLATE 100 MG PO TABS: 100.0000 mg | ORAL_TABLET | Freq: Two times a day (BID) | ORAL | 0 refills | Status: AC

## 2019-01-07 NOTE — Telephone Encounter (Signed)
Patient aware.

## 2019-01-07 NOTE — Telephone Encounter (Signed)
Will change to another antibiotic.

## 2019-01-07 NOTE — Telephone Encounter (Signed)
Patient states that since starting amoxicillin he started itching, body turning red, and broke out in hives. Patient took benadryl and he stopped the medication. Patient needs another antibiotic.

## 2019-01-08 ENCOUNTER — Encounter: Payer: BC Managed Care – PPO | Admitting: Physical Therapy

## 2019-01-13 ENCOUNTER — Other Ambulatory Visit: Payer: Self-pay | Admitting: Nurse Practitioner

## 2019-01-13 ENCOUNTER — Ambulatory Visit: Payer: BC Managed Care – PPO | Admitting: Physical Therapy

## 2019-01-13 DIAGNOSIS — E876 Hypokalemia: Secondary | ICD-10-CM

## 2019-01-21 ENCOUNTER — Other Ambulatory Visit: Payer: Self-pay | Admitting: Nurse Practitioner

## 2019-01-21 DIAGNOSIS — I10 Essential (primary) hypertension: Secondary | ICD-10-CM

## 2019-01-30 ENCOUNTER — Telehealth: Payer: Self-pay | Admitting: Nurse Practitioner

## 2019-01-30 MED ORDER — SILDENAFIL CITRATE 20 MG PO TABS: ORAL_TABLET | ORAL | 3 refills | Status: DC

## 2019-01-30 NOTE — Telephone Encounter (Signed)
rx sent to pharmacy as requested, TTC pt but mailbox was full and can't leave message.

## 2019-02-03 ENCOUNTER — Ambulatory Visit (INDEPENDENT_AMBULATORY_CARE_PROVIDER_SITE_OTHER): Payer: BC Managed Care – PPO | Admitting: Family

## 2019-02-03 ENCOUNTER — Encounter: Payer: Self-pay | Admitting: Family

## 2019-02-03 DIAGNOSIS — R059 Cough, unspecified: Secondary | ICD-10-CM

## 2019-02-03 DIAGNOSIS — R05 Cough: Secondary | ICD-10-CM | POA: Diagnosis not present

## 2019-02-03 NOTE — Progress Notes (Signed)
   Virtual Visit via telephone Note Due to COVID-19 pandemic this visit was conducted virtually. This visit type was conducted due to national recommendations for restrictions regarding the COVID-19 Pandemic (e.g. social distancing, sheltering in place) in an effort to limit this patient's exposure and mitigate transmission in our community. All issues noted in this document were discussed and addressed.  A physical exam was not performed with this format.  I connected with Christian King on 02/03/19 at 11:12 AM  by telephone and verified that I am speaking with the correct person using two identifiers. Christian King is currently located at home and no one is currently with him during visit. The provider, Evelina Dun, FNP is located in their office at time of visit.  I discussed the limitations, risks, security and privacy concerns of performing an evaluation and management service by telephone and the availability of in person appointments. I also discussed with the patient that there may be a patient responsible charge related to this service. The patient expressed understanding and agreed to proceed.   History and Present Illness:  Cough This is a new problem. The current episode started today. The problem has been gradually worsening. The problem occurs every few minutes. The cough is non-productive. Associated symptoms include myalgias, nasal congestion and wheezing. Pertinent negatives include no chills, ear congestion, ear pain, fever, headaches or shortness of breath. The symptoms are aggravated by lying down. Risk factors for lung disease include smoking/tobacco exposure. He has tried rest for the symptoms. The treatment provided mild relief. His past medical history is significant for COPD.      Review of Systems  Constitutional: Negative for chills and fever.  HENT: Negative for ear pain.   Respiratory: Positive for cough and wheezing. Negative for shortness of breath.    Musculoskeletal: Positive for myalgias.  Neurological: Negative for headaches.  All other systems reviewed and are negative.    Observations/Objective: No SOB or distress noted, nasal congestion noted  Assessment and Plan: 1. Cough Discussed getting COVID tested. Pt believes he has the flu. He will if symptoms worsen.  Discussed the importance of self isolation Force fluids Rest Tylenol  Call if symptoms worsen or do not improve     I discussed the assessment and treatment plan with the patient. The patient was provided an opportunity to ask questions and all were answered. The patient agreed with the plan and demonstrated an understanding of the instructions.   The patient was advised to call back or seek an in-person evaluation if the symptoms worsen or if the condition fails to improve as anticipated.  The above assessment and management plan was discussed with the patient. The patient verbalized understanding of and has agreed to the management plan. Patient is aware to call the clinic if symptoms persist or worsen. Patient is aware when to return to the clinic for a follow-up visit. Patient educated on when it is appropriate to go to the emergency department.   Time call ended:  11:27 AM  I provided 15 minutes of non-face-to-face time during this encounter.    Evelina Dun, FNP

## 2019-04-15 ENCOUNTER — Ambulatory Visit: Payer: Self-pay | Admitting: Nurse Practitioner

## 2019-07-07 ENCOUNTER — Encounter: Payer: Self-pay | Admitting: Family Medicine

## 2019-07-07 ENCOUNTER — Ambulatory Visit (INDEPENDENT_AMBULATORY_CARE_PROVIDER_SITE_OTHER): Payer: BC Managed Care – PPO | Admitting: Family Medicine

## 2019-07-07 DIAGNOSIS — J40 Bronchitis, not specified as acute or chronic: Secondary | ICD-10-CM | POA: Diagnosis not present

## 2019-07-07 DIAGNOSIS — J329 Chronic sinusitis, unspecified: Secondary | ICD-10-CM | POA: Diagnosis not present

## 2019-07-07 DIAGNOSIS — H66009 Acute suppurative otitis media without spontaneous rupture of ear drum, unspecified ear: Secondary | ICD-10-CM

## 2019-07-07 MED ORDER — LEVOFLOXACIN 500 MG PO TABS: 500.0000 mg | ORAL_TABLET | Freq: Every day | ORAL | 0 refills | Status: DC

## 2019-07-07 NOTE — Progress Notes (Signed)
    Subjective:    Patient ID: Christian King, male    DOB: Jun 12, 1970, 49 y.o.   MRN: [id number]   HPI: Christian King is a 49 y.o. male presenting for Patient presents with upper respiratory congestion.Patient reports coughing producing phlegm. There is no fever, chills or sweats. The patient denies being short of breath. Gradually worsening.Feeling very cold. Feels like the flu. Achy all  Over. Decreased appetite.Keeping hydrated with water. Onset 3 days ago.    Depression screen [facility] 2/9 06/30/2018 04/09/2017 11/08/2016 09/10/2016 05/29/2016  Decreased Interest 0 0 0 0 0  Down, Depressed, Hopeless 0 0 0 0 1  PHQ - 2 Score 0 0 0 0 1     Relevant past medical, surgical, family and social history reviewed and updated as indicated.  Interim medical history since our last visit reviewed. Allergies and medications reviewed and updated.  ROS:  Review of Systems  Constitutional: Negative for fever.  HENT: Positive for ear pain (right).   Respiratory: Negative for shortness of breath.   Cardiovascular: Negative for chest pain.  Musculoskeletal: Negative for arthralgias.  Skin: Negative for rash.     Social History   Tobacco Use  Smoking Status Current Every Day Smoker  . Packs/day: 0.25  . Types: Cigarettes  . Start date: 05/05/1985  Smokeless Tobacco Never Used       Objective:     Wt Readings from Last 3 Encounters:  06/30/18 170 lb (77.1 kg)  04/09/17 172 lb (78 kg)  11/08/16 170 lb (77.1 kg)     Exam deferred. Pt. Harboring due to COVID 19. Phone visit performed.   Assessment & Plan:   1. Sinobronchitis   2. Acute suppurative otitis media without spontaneous rupture of ear drum, recurrence not specified, unspecified laterality     Meds ordered this encounter  Medications  . levofloxacin (LEVAQUIN) 500 MG tablet    Sig: Take 1 tablet (500 mg total) by mouth daily.    Dispense:  7 tablet    Refill:  0    No orders of the defined types were placed in  this encounter.     Diagnoses and all orders for this visit:  Sinobronchitis  Acute suppurative otitis media without spontaneous rupture of ear drum, recurrence not specified, unspecified laterality  Other orders -     levofloxacin (LEVAQUIN) 500 MG tablet; Take 1 tablet (500 mg total) by mouth daily.    Virtual Visit via telephone Note  I discussed the limitations, risks, security and privacy concerns of performing an evaluation and management service by telephone and the availability of in person appointments. The patient was identified with two identifiers. Pt.expressed understanding and agreed to proceed. Pt. Is at home. Dr. Darlyn Read is in his office.  Follow Up Instructions:   I discussed the assessment and treatment plan with the patient. The patient was provided an opportunity to ask questions and all were answered. The patient agreed with the plan and demonstrated an understanding of the instructions.   The patient was advised to call back or seek an in-person evaluation if the symptoms worsen or if the condition fails to improve as anticipated.   Total minutes including chart review and phone contact time: 12   Follow up plan: Return if symptoms worsen or fail to improve.  Mechele Claude, MD [facility]

## 2019-09-28 ENCOUNTER — Telehealth: Payer: Self-pay | Admitting: Nurse Practitioner

## 2019-09-28 NOTE — Telephone Encounter (Signed)
Appt made

## 2019-09-29 ENCOUNTER — Encounter: Payer: Self-pay | Admitting: Family Medicine

## 2019-09-29 ENCOUNTER — Ambulatory Visit (INDEPENDENT_AMBULATORY_CARE_PROVIDER_SITE_OTHER): Payer: BC Managed Care – PPO | Admitting: Family Medicine

## 2019-09-29 DIAGNOSIS — J019 Acute sinusitis, unspecified: Secondary | ICD-10-CM | POA: Diagnosis not present

## 2019-09-29 MED ORDER — PREDNISONE 10 MG (21) PO TBPK: ORAL_TABLET | ORAL | 0 refills | Status: DC

## 2019-09-29 MED ORDER — LEVOFLOXACIN 750 MG PO TABS: 750.0000 mg | ORAL_TABLET | Freq: Every day | ORAL | 0 refills | Status: AC

## 2019-09-29 NOTE — Progress Notes (Signed)
Virtual Visit via Telephone Note  I connected with [name] on 09/29/19 at 4:12 PM by telephone and verified that I am speaking with the correct person using two identifiers. [name] is currently located at home and nobody is currently with him during this visit. The provider, Gwenlyn Fudge, FNP is located in their home at time of visit.  I discussed the limitations, risks, security and privacy concerns of performing an evaluation and management service by telephone and the availability of in person appointments. I also discussed with the patient that there may be a patient responsible charge related to this service. The patient expressed understanding and agreed to proceed.  Subjective: PCP: Bennie Pierini, FNP  Chief Complaint  Patient presents with  . Bronchitis   Patient complains of cough, head congestion, ear pain/pressure, facial pain/pressure and body aches. Onset of symptoms was 3 days ago, gradually worsening since that time. He is drinking plenty of fluids. Evaluation to date: none. Treatment to date: Ibuprofen. He has a history of bronchitis. He does smoke.    ROS: Per HPI  Current Outpatient Medications:  .  aspirin 325 MG tablet, Take 325 mg by mouth daily., Disp: , Rfl:  .  benzonatate (TESSALON PERLES) 100 MG capsule, Take 1 capsule (100 mg total) by mouth 3 (three) times daily as needed for cough., Disp: 20 capsule, Rfl: 0 .  fenofibrate 54 MG tablet, Take 1 tablet by mouth  daily, Disp: 90 tablet, Rfl: 1 .  magnesium oxide (MAG-OX) 400 MG tablet, Take 1 tablet (400 mg total) by mouth daily., Disp: 90 tablet, Rfl: 1 .  metoprolol succinate (TOPROL-XL) 50 MG 24 hr tablet, TAKE WITH OR IMMEDIATELY FOLLOWING A MEAL., Disp: 90 tablet, Rfl: 0 .  potassium chloride (KLOR-CON M10) 10 MEQ tablet, TAKE 1 TABLET BY MOUTH EVERY DAY, Disp: 90 tablet, Rfl: 1 .  sildenafil (REVATIO) 20 MG tablet, TAKE TWO TO FIVE TABLETS BY MOUTH AS NEEDED FOR SEX, Disp:  100 tablet, Rfl: 3 .  simvastatin (ZOCOR) 40 MG tablet, Take 1 tablet (40 mg total) by mouth daily., Disp: 90 tablet, Rfl: 1  Allergies  Allergen Reactions  . Azithromycin Other (See Comments)    History of QT prolongation  . Amoxicillin Hives and Itching  . Hctz [Hydrochlorothiazide] Rash  . Tramadol Itching   Past Medical History:  Diagnosis Date  . Alcohol abuse   . B12 deficiency   . Blood dyscrasia    hemachromatosis  . Bronchitis   . Degenerative joint disease (DJD) of hip   . GERD (gastroesophageal reflux disease)   . Gout   . History of kidney stones   . HLD (hyperlipidemia)   . HTN (hypertension)   . Hypothyroidism   . Ventricular tachycardia (HCC)    in setting of hypokalemia/ hypomagnesemia and ETOH  . Wears glasses     Observations/Objective: A&O  No respiratory distress or wheezing audible over the phone Mood, judgement, and thought processes all WNL  Assessment and Plan: 1. Acute non-recurrent sinusitis, unspecified location - Encouraged patient to get tested for COVID-19 to rule this out. Discussed symptom management. Work note provided via Clinical cytogeneticist.  - predniSONE (STERAPRED UNI-PAK 21 TAB) 10 MG (21) TBPK tablet; As directed x 6 days  Dispense: 21 tablet; Refill: 0 - levofloxacin (LEVAQUIN) 750 MG tablet; Take 1 tablet (750 mg total) by mouth daily for 7 days.  Dispense: 7 tablet; Refill: 0   Follow Up Instructions:  I discussed the assessment  and treatment plan with the patient. The patient was provided an opportunity to ask questions and all were answered. The patient agreed with the plan and demonstrated an understanding of the instructions.   The patient was advised to call back or seek an in-person evaluation if the symptoms worsen or if the condition fails to improve as anticipated.  The above assessment and management plan was discussed with the patient. The patient verbalized understanding of and has agreed to the management plan. Patient is  aware to call the clinic if symptoms persist or worsen. Patient is aware when to return to the clinic for a follow-up visit. Patient educated on when it is appropriate to go to the emergency department.   Time call ended: 4:33 PM  I provided 23 minutes of non-face-to-face time during this encounter.  Deliah Boston, MSN, APRN, FNP-C [facility] 09/29/19

## 2019-11-09 ENCOUNTER — Other Ambulatory Visit: Payer: Self-pay | Admitting: Nurse Practitioner

## 2019-11-09 DIAGNOSIS — I10 Essential (primary) hypertension: Secondary | ICD-10-CM

## 2019-12-30 ENCOUNTER — Other Ambulatory Visit: Payer: Self-pay | Admitting: Nurse Practitioner

## 2019-12-30 DIAGNOSIS — I10 Essential (primary) hypertension: Secondary | ICD-10-CM

## 2019-12-30 NOTE — Telephone Encounter (Signed)
Patient aware.

## 2019-12-30 NOTE — Telephone Encounter (Signed)
MMM NTBS 30 days given 11/09/19

## 2019-12-31 ENCOUNTER — Telehealth: Payer: Self-pay

## 2019-12-31 NOTE — Telephone Encounter (Signed)
Left detailed message - for patient to call schedule an appointment for medication refill.

## 2020-01-05 ENCOUNTER — Other Ambulatory Visit: Payer: Self-pay | Admitting: Nurse Practitioner

## 2020-01-05 DIAGNOSIS — I10 Essential (primary) hypertension: Secondary | ICD-10-CM

## 2020-02-08 ENCOUNTER — Encounter: Payer: Self-pay | Admitting: Nurse Practitioner

## 2020-02-08 ENCOUNTER — Other Ambulatory Visit: Payer: Self-pay

## 2020-02-08 ENCOUNTER — Ambulatory Visit (INDEPENDENT_AMBULATORY_CARE_PROVIDER_SITE_OTHER): Payer: BC Managed Care – PPO | Admitting: Nurse Practitioner

## 2020-02-08 VITALS — BP 136/83 | HR 82 | Temp 98.2°F | Resp 20 | Ht 67.0 in | Wt 179.0 lb

## 2020-02-08 DIAGNOSIS — F1010 Alcohol abuse, uncomplicated: Secondary | ICD-10-CM

## 2020-02-08 DIAGNOSIS — Z96643 Presence of artificial hip joint, bilateral: Secondary | ICD-10-CM

## 2020-02-08 DIAGNOSIS — E785 Hyperlipidemia, unspecified: Secondary | ICD-10-CM

## 2020-02-08 DIAGNOSIS — I10 Essential (primary) hypertension: Secondary | ICD-10-CM | POA: Diagnosis not present

## 2020-02-08 DIAGNOSIS — I82409 Acute embolism and thrombosis of unspecified deep veins of unspecified lower extremity: Secondary | ICD-10-CM | POA: Diagnosis not present

## 2020-02-08 DIAGNOSIS — Z1211 Encounter for screening for malignant neoplasm of colon: Secondary | ICD-10-CM

## 2020-02-08 DIAGNOSIS — Z6825 Body mass index (BMI) 25.0-25.9, adult: Secondary | ICD-10-CM

## 2020-02-08 DIAGNOSIS — E782 Mixed hyperlipidemia: Secondary | ICD-10-CM

## 2020-02-08 DIAGNOSIS — E039 Hypothyroidism, unspecified: Secondary | ICD-10-CM | POA: Diagnosis not present

## 2020-02-08 DIAGNOSIS — Z1212 Encounter for screening for malignant neoplasm of rectum: Secondary | ICD-10-CM

## 2020-02-08 DIAGNOSIS — E538 Deficiency of other specified B group vitamins: Secondary | ICD-10-CM

## 2020-02-08 DIAGNOSIS — M1A9XX0 Chronic gout, unspecified, without tophus (tophi): Secondary | ICD-10-CM

## 2020-02-08 DIAGNOSIS — E876 Hypokalemia: Secondary | ICD-10-CM

## 2020-02-08 DIAGNOSIS — G4700 Insomnia, unspecified: Secondary | ICD-10-CM

## 2020-02-08 MED ORDER — FENOFIBRATE 54 MG PO TABS: ORAL_TABLET | ORAL | 1 refills | Status: DC

## 2020-02-08 MED ORDER — SIMVASTATIN 40 MG PO TABS: 40.0000 mg | ORAL_TABLET | Freq: Every day | ORAL | 1 refills | Status: DC

## 2020-02-08 MED ORDER — MAGNESIUM OXIDE 400 MG PO TABS: 400.0000 mg | ORAL_TABLET | Freq: Every day | ORAL | 1 refills | Status: DC

## 2020-02-08 MED ORDER — METOPROLOL SUCCINATE ER 50 MG PO TB24: ORAL_TABLET | ORAL | 1 refills | Status: DC

## 2020-02-08 MED ORDER — POTASSIUM CHLORIDE CRYS ER 10 MEQ PO TBCR: EXTENDED_RELEASE_TABLET | ORAL | 1 refills | Status: DC

## 2020-02-08 NOTE — Progress Notes (Signed)
Subjective:    Patient ID: Christian King, male    DOB: 06-Aug-1970, 49 y.o.   MRN: [id number]   Chief Complaint: Medical Management of Chronic Issues    HPI:  1. Essential hypertension No c/o cheat pain, sob or headache. Does not check blood pressure at home. BP Readings from Last 3 Encounters:  02/08/20 136/83  09/22/18 133/85  06/30/18 (!) 149/97     2. Mixed hyperlipidemia Doe swatch diet but does little to no exercise. Lab Results  Component Value Date   CHOL 118 06/30/2018   HDL 43 06/30/2018   LDLCALC 33 06/30/2018   TRIG 208 (H) 06/30/2018   CHOLHDL 2.7 06/30/2018     3. Acquired hypothyroidism No problems that aware of. Lab Results  Component Value Date   TSH 2.250 06/30/2018     4. Acute deep vein thrombosis (DVT) of lower extremity, unspecified laterality, unspecified vein (HCC) I son a daily aspirin without bleeding  5. Hypokalemia No c/o lower ext cramps Lab Results  Component Value Date   K 3.8 06/30/2018     6. Insomnia, unspecified type Usually doe snot have nay trouble sleeping  7. Alcohol abuse Drinks occasionally  8. B12 deficiency Takes no b12  9. Chronic gout without tophus, unspecified cause, unspecified site No recent  Flare ups  10. Hereditary hemochromatosis [facility]) Lab Results  Component Value Date   FERRITIN 349 04/09/2017     11. BMI 25.0-25.9,adult Weight is up 9lbs from previous office visit Wt Readings from Last 3 Encounters:  02/08/20 179 lb (81.2 kg)  06/30/18 170 lb (77.1 kg)  04/09/17 172 lb (78 kg)   BMI Readings from Last 3 Encounters:  02/08/20 28.04 kg/m  06/30/18 26.63 kg/m  04/09/17 26.94 kg/m       Outpatient Encounter Medications as of 02/08/2020  Medication Sig  . aspirin 325 MG tablet Take 325 mg by mouth daily.  . fenofibrate 54 MG tablet Take 1 tablet by mouth  daily  . magnesium oxide (MAG-OX) 400 MG tablet Take 1 tablet (400 mg total) by mouth daily.  . metoprolol succinate  (TOPROL-XL) 50 MG 24 hr tablet TAKE 1 TABLET BY MOUTH ONCE DAILY WITH A MEAL OR IMMEDIATELY AFTER A MEAL,  . potassium chloride (KLOR-CON M10) 10 MEQ tablet TAKE 1 TABLET BY MOUTH EVERY DAY  . sildenafil (REVATIO) 20 MG tablet TAKE TWO TO FIVE TABLETS BY MOUTH AS NEEDED FOR SEX  . simvastatin (ZOCOR) 40 MG tablet Take 1 tablet (40 mg total) by mouth daily.  . [DISCONTINUED] predniSONE (STERAPRED UNI-PAK 21 TAB) 10 MG (21) TBPK tablet As directed x 6 days    Past Surgical History:  Procedure Laterality Date  . TOTAL HIP ARTHROPLASTY Right   . TOTAL HIP ARTHROPLASTY Left 03/16/2016   Procedure: TOTAL HIP ARTHROPLASTY;  Surgeon: Earlie Server, MD;  Location: [facility];  Service: Orthopedics;  Laterality: Left;  . vascularized fibular graft     right hip for avascular necrosis    Family History  Problem Relation Age of Onset  . Hypertension Father   . Diabetes Maternal Grandmother   . Hypertension Paternal Grandmother   . Stroke Paternal Grandmother     New complaints: Has a "fractured scapula". Is wearing a sling.  Social history: Lives with his wife  Controlled substance contract: n/a    Review of Systems  Constitutional: Negative for diaphoresis.  Eyes: Negative for pain.  Respiratory: Negative for shortness of breath.   Cardiovascular: Negative for chest  pain, palpitations and leg swelling.  Gastrointestinal: Negative for abdominal pain.  Endocrine: Negative for polydipsia.  Skin: Negative for rash.  Neurological: Negative for dizziness, weakness and headaches.  Hematological: Does not bruise/bleed easily.  All other systems reviewed and are negative.      Objective:   Physical Exam Vitals and nursing note reviewed.  Constitutional:      Appearance: Normal appearance. He is well-developed and well-nourished.  HENT:     Head: Normocephalic.     Nose: Nose normal.     Mouth/Throat:     Mouth: Oropharynx is clear and moist.  Eyes:     Extraocular Movements: EOM  normal.     Pupils: Pupils are equal, round, and reactive to light.  Neck:     Thyroid: No thyroid mass or thyromegaly.     Vascular: No carotid bruit or JVD.     Trachea: Phonation normal.  Cardiovascular:     Rate and Rhythm: Normal rate and regular rhythm.  Pulmonary:     Effort: Pulmonary effort is normal. No respiratory distress.     Breath sounds: Normal breath sounds.  Abdominal:     General: Bowel sounds are normal. Aorta is normal.     Palpations: Abdomen is soft.     Tenderness: There is no abdominal tenderness.  Musculoskeletal:        General: Normal range of motion.     Cervical back: Normal range of motion and neck supple.     Comments: Right arm in sling  Lymphadenopathy:     Cervical: No cervical adenopathy.  Skin:    General: Skin is warm and dry.  Neurological:     Mental Status: He is alert and oriented to person, place, and time.  Psychiatric:        Mood and Affect: Mood and affect normal.        Behavior: Behavior normal.        Thought Content: Thought content normal.        Judgment: Judgment normal.    BP 136/83   Pulse 82   Temp 98.2 F (36.8 C) (Temporal)   Resp 20   Ht '5\' 7"'  (1.702 m)   Wt 179 lb (81.2 kg)   SpO2 96%   BMI 28.04 kg/m         Assessment & Plan:  DERRIL FRANEK comes in today with chief complaint of Medical Management of Chronic Issues   Diagnosis and orders addressed:  1. Essential hypertension Low sodium diet - metoprolol succinate (TOPROL-XL) 50 MG 24 hr tablet; TAKE 1 TABLET BY MOUTH ONCE DAILY WITH A MEAL OR IMMEDIATELY AFTER A MEAL,  Dispense: 90 tablet; Refill: 1 - CBC with Differential/Platelet - CMP14+EGFR  2. Mixed hyperlipidemia Low fat diet - Lipid panel - simvastatin (ZOCOR) 40 MG tablet; Take 1 tablet (40 mg total) by mouth daily.  Dispense: 90 tablet; Refill: 1 - fenofibrate 54 MG tablet; Take 1 tablet by mouth  daily  Dispense: 90 tablet; Refill: 1  3. Acquired hypothyroidism Labs  pending - Thyroid Panel With TSH  4. Acute deep vein thrombosis (DVT) of lower extremity, unspecified laterality, unspecified vein (HCC) Continue daily aspirin  5. Hypokalemia Labs pedning - potassium chloride (KLOR-CON M10) 10 MEQ tablet; TAKE 1 TABLET BY MOUTH EVERY DAY  Dispense: 90 tablet; Refill: 1  6. Insomnia, unspecified type Bedtime routine  7. Alcohol abuse Still drinks regularly  8. B12 deficiency Continue daily vitamin b12  9. Chronic gout without  tophus, unspecified cause, unspecified site Continue low purine diet  10. Hereditary hemochromatosis ([place]) Labs pending  11. BMI 25.0-25.9,adult Discussed diet and exercise for person with BMI >25 Will recheck weight in 3-6 months   12. History of bilateral hip replacements - Chromium and Cobalt, WB ([facility])   Labs pending Health Maintenance reviewed Diet and exercise encouraged  Follow up plan: 6 months   [place], FNP

## 2020-02-08 NOTE — Patient Instructions (Signed)
Cologuard ° °Your provider has prescribed Cologuard, an easy-to-use, noninvasive test for colon cancer screening, based on the latest advances in stool DNA science.  ° °Here's what will happen next: ° °1. You may receive a call or email from [facility] to confirm your mailing address and insurance information °2. Your kit will be shipped directly to you °3. You collect your stool sample in the privacy of your own home. Please follow the instructions that come with the kit °4. You return the kit via UPS prepaid shipping or pick-up, in the same box it arrived in °5. You should receive a call with the results once they are available. If you do not receive a call, please contact our office at [phone] ° °Insurance Coverage ° °Cologuard is covered by Medicare and most major insurers °Cologuard is covered by Medicare and Medicare Advantage with no co-pay or deductible for eligible patients ages 50-85. °Nationwide, more than 94% of Cologuard patients have no out-of-pocket cost for screening. °• Based on the Affordable Care Act, Cologuard should be covered by most private insurers with no co-pay or deductible for eligible patients (ages 50-75; at average risk for colon cancer; without symptoms). °Currently, ~74% of Cologuard patients 45-49 have had no out-of-pocket cost for screening.  °• Many national and regional payers have begun paying for CRC screening at 45. [facility] continues to work with payers to expand coverage and access for patients ages 45-49.  ° °Only your healthcare insurance provider can confirm how Cologuard will be covered for you. If you have questions about coverage, you can contact your insurance company directly or ask the specialists at [facility] to do that for you. A Customer Support Specialist can be reached at [phone].   ° °Patient Support °Screening for colon cancer is very important to your good health, so if you have any questions at all, please call [facility]'s Customer Support Specialists at [phone]. They are available 24 hours a day, 6 days a week. An instructional video is available to view online at [email or web address]  ° °*Information and graphics obtained from [email or web address] ° °

## 2020-02-17 LAB — LIPID PANEL
Chol/HDL Ratio: 4.6 ratio (ref 0.0–5.0)
Cholesterol, Total: 180 mg/dL (ref 100–199)
HDL: 39 mg/dL — ABNORMAL LOW (ref >39)
LDL Chol Calc (NIH): 40 mg/dL (ref 0–99)
Triglycerides: 730 mg/dL (ref 0–149)
VLDL Cholesterol Cal: 101 mg/dL — ABNORMAL HIGH (ref 5–40)

## 2020-02-17 LAB — CMP14+EGFR
ALT: 13 IU/L (ref 0–44)
AST: 24 IU/L (ref 0–40)
Albumin/Globulin Ratio: 1.6 (ref 1.2–2.2)
Albumin: 4.2 g/dL (ref 3.8–4.9)
Alkaline Phosphatase: 76 IU/L (ref 44–121)
BUN/Creatinine Ratio: 10 (ref 9–20)
BUN: 10 mg/dL (ref 6–24)
Bilirubin Total: 1 mg/dL (ref 0.0–1.2)
CO2: 21 mmol/L (ref 20–29)
Calcium: 8.6 mg/dL — ABNORMAL LOW (ref 8.7–10.2)
Chloride: 97 mmol/L (ref 96–106)
Creatinine, Ser: 1.01 mg/dL (ref 0.76–1.27)
GFR calc Af Amer: 99 mL/min/{1.73_m2} (ref >59)
GFR calc non Af Amer: 86 mL/min/{1.73_m2} (ref >59)
Globulin, Total: 2.7 g/dL (ref 1.5–4.5)
Glucose: 97 mg/dL (ref 65–99)
Potassium: 3.4 mmol/L — ABNORMAL LOW (ref 3.5–5.2)
Sodium: 135 mmol/L (ref 134–144)
Total Protein: 6.9 g/dL (ref 6.0–8.5)

## 2020-02-17 LAB — CBC WITH DIFFERENTIAL/PLATELET
Basophils Absolute: 0.1 10*3/uL (ref 0.0–0.2)
Basos: 1 %
EOS (ABSOLUTE): 0.1 10*3/uL (ref 0.0–0.4)
Eos: 1 %
Hematocrit: 40.7 % (ref 37.5–51.0)
Hemoglobin: 14.1 g/dL (ref 13.0–17.7)
Immature Grans (Abs): 0 10*3/uL (ref 0.0–0.1)
Immature Granulocytes: 0 %
Lymphocytes Absolute: 1.6 10*3/uL (ref 0.7–3.1)
Lymphs: 28 %
MCH: 32.6 pg (ref 26.6–33.0)
MCHC: 34.6 g/dL (ref 31.5–35.7)
MCV: 94 fL (ref 79–97)
Monocytes Absolute: 0.5 10*3/uL (ref 0.1–0.9)
Monocytes: 10 %
Neutrophils Absolute: 3.3 10*3/uL (ref 1.4–7.0)
Neutrophils: 60 %
Platelets: 199 10*3/uL (ref 150–450)
RBC: 4.32 x10E6/uL (ref 4.14–5.80)
RDW: 13.5 % (ref 11.6–15.4)
WBC: 5.6 10*3/uL (ref 3.4–10.8)

## 2020-02-17 LAB — THYROID PANEL WITH TSH
Free Thyroxine Index: 2.2 (ref 1.2–4.9)
T3 Uptake Ratio: 26 % (ref 24–39)
T4, Total: 8.4 ug/dL (ref 4.5–12.0)
TSH: 3.89 u[IU]/mL (ref 0.450–4.500)

## 2020-02-17 LAB — CHROMIUM AND COBALT, WB (MOM)
Chromium: 1.3 ng/mL (ref <3.0)
Cobalt: <1 ng/mL (ref <3.0)

## 2020-04-22 ENCOUNTER — Telehealth: Payer: Self-pay

## 2020-04-22 NOTE — Telephone Encounter (Signed)
Pt called stating that at his last appt, a cologuard was supposed to be ordered for him and sent to his home, and pt says he never received it.  Please advise and call patient.

## 2020-04-22 NOTE — Telephone Encounter (Signed)
Informed patient that Cologuard test will be ordered.

## 2020-05-21 ENCOUNTER — Inpatient Hospital Stay (HOSPITAL_COMMUNITY)
Admission: EM | Admit: 2020-05-21 | Discharge: 2020-05-25 | DRG: 917 | Disposition: A | Discharge status: Home / Self Care | Payer: BC Managed Care – PPO | Attending: Family Medicine | Admitting: Family Medicine

## 2020-05-21 ENCOUNTER — Encounter (HOSPITAL_COMMUNITY): Payer: Self-pay | Admitting: Emergency Medicine

## 2020-05-21 ENCOUNTER — Other Ambulatory Visit: Payer: Self-pay

## 2020-05-21 DIAGNOSIS — J9600 Acute respiratory failure, unspecified whether with hypoxia or hypercapnia: Secondary | ICD-10-CM

## 2020-05-21 DIAGNOSIS — M79662 Pain in left lower leg: Secondary | ICD-10-CM | POA: Diagnosis not present

## 2020-05-21 DIAGNOSIS — E538 Deficiency of other specified B group vitamins: Secondary | ICD-10-CM

## 2020-05-21 DIAGNOSIS — F10921 Alcohol use, unspecified with intoxication delirium: Secondary | ICD-10-CM | POA: Diagnosis not present

## 2020-05-21 DIAGNOSIS — F10229 Alcohol dependence with intoxication, unspecified: Secondary | ICD-10-CM | POA: Diagnosis present

## 2020-05-21 DIAGNOSIS — E039 Hypothyroidism, unspecified: Secondary | ICD-10-CM | POA: Diagnosis present

## 2020-05-21 DIAGNOSIS — Z86718 Personal history of other venous thrombosis and embolism: Secondary | ICD-10-CM | POA: Diagnosis not present

## 2020-05-21 DIAGNOSIS — K219 Gastro-esophageal reflux disease without esophagitis: Secondary | ICD-10-CM | POA: Diagnosis not present

## 2020-05-21 DIAGNOSIS — F1010 Alcohol abuse, uncomplicated: Secondary | ICD-10-CM | POA: Diagnosis present

## 2020-05-21 DIAGNOSIS — E785 Hyperlipidemia, unspecified: Secondary | ICD-10-CM | POA: Diagnosis not present

## 2020-05-21 DIAGNOSIS — Z79899 Other long term (current) drug therapy: Secondary | ICD-10-CM | POA: Diagnosis not present

## 2020-05-21 DIAGNOSIS — Z20822 Contact with and (suspected) exposure to covid-19: Secondary | ICD-10-CM | POA: Diagnosis not present

## 2020-05-21 DIAGNOSIS — E860 Dehydration: Secondary | ICD-10-CM | POA: Diagnosis present

## 2020-05-21 DIAGNOSIS — R918 Other nonspecific abnormal finding of lung field: Secondary | ICD-10-CM | POA: Diagnosis not present

## 2020-05-21 DIAGNOSIS — Y908 Blood alcohol level of 240 mg/100 ml or more: Secondary | ICD-10-CM | POA: Diagnosis present

## 2020-05-21 DIAGNOSIS — Z4682 Encounter for fitting and adjustment of non-vascular catheter: Secondary | ICD-10-CM | POA: Diagnosis not present

## 2020-05-21 DIAGNOSIS — M79605 Pain in left leg: Secondary | ICD-10-CM | POA: Diagnosis not present

## 2020-05-21 DIAGNOSIS — G9341 Metabolic encephalopathy: Secondary | ICD-10-CM | POA: Diagnosis not present

## 2020-05-21 DIAGNOSIS — F17210 Nicotine dependence, cigarettes, uncomplicated: Secondary | ICD-10-CM | POA: Diagnosis not present

## 2020-05-21 DIAGNOSIS — J9601 Acute respiratory failure with hypoxia: Secondary | ICD-10-CM | POA: Diagnosis not present

## 2020-05-21 DIAGNOSIS — M79661 Pain in right lower leg: Secondary | ICD-10-CM | POA: Diagnosis not present

## 2020-05-21 DIAGNOSIS — F10129 Alcohol abuse with intoxication, unspecified: Secondary | ICD-10-CM | POA: Diagnosis not present

## 2020-05-21 DIAGNOSIS — M79604 Pain in right leg: Secondary | ICD-10-CM | POA: Diagnosis not present

## 2020-05-21 DIAGNOSIS — R0689 Other abnormalities of breathing: Secondary | ICD-10-CM | POA: Diagnosis not present

## 2020-05-21 DIAGNOSIS — T510X1A Toxic effect of ethanol, accidental (unintentional), initial encounter: Principal | ICD-10-CM | POA: Diagnosis present

## 2020-05-21 DIAGNOSIS — Z96643 Presence of artificial hip joint, bilateral: Secondary | ICD-10-CM | POA: Diagnosis not present

## 2020-05-21 DIAGNOSIS — R1111 Vomiting without nausea: Secondary | ICD-10-CM | POA: Diagnosis not present

## 2020-05-21 DIAGNOSIS — E876 Hypokalemia: Secondary | ICD-10-CM

## 2020-05-21 DIAGNOSIS — D510 Vitamin B12 deficiency anemia due to intrinsic factor deficiency: Secondary | ICD-10-CM | POA: Diagnosis present

## 2020-05-21 DIAGNOSIS — Z7982 Long term (current) use of aspirin: Secondary | ICD-10-CM | POA: Diagnosis not present

## 2020-05-21 DIAGNOSIS — R059 Cough, unspecified: Secondary | ICD-10-CM | POA: Diagnosis not present

## 2020-05-21 DIAGNOSIS — R404 Transient alteration of awareness: Secondary | ICD-10-CM | POA: Diagnosis not present

## 2020-05-21 DIAGNOSIS — T17908A Unspecified foreign body in respiratory tract, part unspecified causing other injury, initial encounter: Secondary | ICD-10-CM

## 2020-05-21 DIAGNOSIS — R4182 Altered mental status, unspecified: Secondary | ICD-10-CM | POA: Diagnosis not present

## 2020-05-21 DIAGNOSIS — F10929 Alcohol use, unspecified with intoxication, unspecified: Secondary | ICD-10-CM | POA: Diagnosis present

## 2020-05-21 DIAGNOSIS — I10 Essential (primary) hypertension: Secondary | ICD-10-CM | POA: Diagnosis not present

## 2020-05-21 DIAGNOSIS — R0902 Hypoxemia: Secondary | ICD-10-CM | POA: Diagnosis not present

## 2020-05-21 DIAGNOSIS — M79606 Pain in leg, unspecified: Secondary | ICD-10-CM

## 2020-05-21 DIAGNOSIS — J189 Pneumonia, unspecified organism: Secondary | ICD-10-CM | POA: Diagnosis not present

## 2020-05-21 DIAGNOSIS — J9602 Acute respiratory failure with hypercapnia: Secondary | ICD-10-CM | POA: Diagnosis not present

## 2020-05-21 MED ORDER — SUCCINYLCHOLINE CHLORIDE 20 MG/ML IJ SOLN: 150.0000 mg | Freq: Once | INTRAMUSCULAR | Status: DC

## 2020-05-21 MED ORDER — SUCCINYLCHOLINE CHLORIDE 20 MG/ML IJ SOLN
150.0000 mg | Freq: Once | INTRAMUSCULAR | Status: AC
  Administered 2020-05-21: 150 mg via INTRAVENOUS

## 2020-05-21 MED ORDER — ETOMIDATE 2 MG/ML IV SOLN
20.0000 mg | Freq: Once | INTRAVENOUS | Status: AC
  Administered 2020-05-21: 20 mg via INTRAVENOUS

## 2020-05-21 MED ORDER — ETOMIDATE 2 MG/ML IV SOLN: 20.0000 mg | Freq: Once | INTRAVENOUS | Status: DC

## 2020-05-21 NOTE — ED Triage Notes (Addendum)
[facility] - pt brought in for unresponsiveness. Pt's wife states that he usually drinks more ETOH than tonight, but isnt acting right.  Possible aspiration, npa in place, and BVM assisted resp

## 2020-05-21 NOTE — ED Notes (Signed)
20 Etomidate @ 2354 150 Succ @ 2355  7.5 ETT in @ 2356  25 @ teeth with + color change.

## 2020-05-21 NOTE — ED Notes (Signed)
Pt unresponsive and EDp at bedside. No MSE waiver

## 2020-05-22 ENCOUNTER — Emergency Department (HOSPITAL_COMMUNITY): Payer: BC Managed Care – PPO

## 2020-05-22 ENCOUNTER — Encounter (HOSPITAL_COMMUNITY): Payer: Self-pay | Admitting: Family Medicine

## 2020-05-22 ENCOUNTER — Inpatient Hospital Stay (HOSPITAL_COMMUNITY): Payer: BC Managed Care – PPO

## 2020-05-22 DIAGNOSIS — E876 Hypokalemia: Secondary | ICD-10-CM | POA: Diagnosis present

## 2020-05-22 DIAGNOSIS — Z7982 Long term (current) use of aspirin: Secondary | ICD-10-CM | POA: Diagnosis not present

## 2020-05-22 DIAGNOSIS — J9601 Acute respiratory failure with hypoxia: Secondary | ICD-10-CM | POA: Diagnosis not present

## 2020-05-22 DIAGNOSIS — F17210 Nicotine dependence, cigarettes, uncomplicated: Secondary | ICD-10-CM | POA: Diagnosis present

## 2020-05-22 DIAGNOSIS — F10229 Alcohol dependence with intoxication, unspecified: Secondary | ICD-10-CM | POA: Diagnosis present

## 2020-05-22 DIAGNOSIS — Z20822 Contact with and (suspected) exposure to covid-19: Secondary | ICD-10-CM | POA: Diagnosis present

## 2020-05-22 DIAGNOSIS — E538 Deficiency of other specified B group vitamins: Secondary | ICD-10-CM | POA: Diagnosis not present

## 2020-05-22 DIAGNOSIS — M79604 Pain in right leg: Secondary | ICD-10-CM | POA: Diagnosis not present

## 2020-05-22 DIAGNOSIS — E785 Hyperlipidemia, unspecified: Secondary | ICD-10-CM | POA: Diagnosis present

## 2020-05-22 DIAGNOSIS — F10929 Alcohol use, unspecified with intoxication, unspecified: Secondary | ICD-10-CM | POA: Diagnosis present

## 2020-05-22 DIAGNOSIS — J9600 Acute respiratory failure, unspecified whether with hypoxia or hypercapnia: Secondary | ICD-10-CM

## 2020-05-22 DIAGNOSIS — Z86718 Personal history of other venous thrombosis and embolism: Secondary | ICD-10-CM | POA: Diagnosis not present

## 2020-05-22 DIAGNOSIS — Y908 Blood alcohol level of 240 mg/100 ml or more: Secondary | ICD-10-CM | POA: Diagnosis present

## 2020-05-22 DIAGNOSIS — I10 Essential (primary) hypertension: Secondary | ICD-10-CM | POA: Diagnosis present

## 2020-05-22 DIAGNOSIS — J9602 Acute respiratory failure with hypercapnia: Secondary | ICD-10-CM | POA: Diagnosis not present

## 2020-05-22 DIAGNOSIS — K219 Gastro-esophageal reflux disease without esophagitis: Secondary | ICD-10-CM | POA: Diagnosis present

## 2020-05-22 DIAGNOSIS — G9341 Metabolic encephalopathy: Secondary | ICD-10-CM | POA: Diagnosis present

## 2020-05-22 DIAGNOSIS — T510X1A Toxic effect of ethanol, accidental (unintentional), initial encounter: Secondary | ICD-10-CM | POA: Diagnosis present

## 2020-05-22 DIAGNOSIS — Z96643 Presence of artificial hip joint, bilateral: Secondary | ICD-10-CM | POA: Diagnosis present

## 2020-05-22 DIAGNOSIS — E039 Hypothyroidism, unspecified: Secondary | ICD-10-CM | POA: Diagnosis present

## 2020-05-22 DIAGNOSIS — E860 Dehydration: Secondary | ICD-10-CM | POA: Diagnosis present

## 2020-05-22 DIAGNOSIS — Z79899 Other long term (current) drug therapy: Secondary | ICD-10-CM | POA: Diagnosis not present

## 2020-05-22 DIAGNOSIS — F10921 Alcohol use, unspecified with intoxication delirium: Secondary | ICD-10-CM | POA: Diagnosis not present

## 2020-05-22 DIAGNOSIS — D510 Vitamin B12 deficiency anemia due to intrinsic factor deficiency: Secondary | ICD-10-CM | POA: Diagnosis present

## 2020-05-22 LAB — CBC WITH DIFFERENTIAL/PLATELET
Abs Immature Granulocytes: 0.05 10*3/uL (ref 0.00–0.07)
Basophils Absolute: 0.1 10*3/uL (ref 0.0–0.1)
Basophils Relative: 1 %
Eosinophils Absolute: 0.2 10*3/uL (ref 0.0–0.5)
Eosinophils Relative: 2 %
HCT: 44.5 % (ref 39.0–52.0)
Hemoglobin: 14.5 g/dL (ref 13.0–17.0)
Immature Granulocytes: 1 %
Lymphocytes Relative: 60 %
Lymphs Abs: 4.9 10*3/uL — ABNORMAL HIGH (ref 0.7–4.0)
MCH: 32.8 pg (ref 26.0–34.0)
MCHC: 32.6 g/dL (ref 30.0–36.0)
MCV: 100.7 fL — ABNORMAL HIGH (ref 80.0–100.0)
Monocytes Absolute: 0.8 10*3/uL (ref 0.1–1.0)
Monocytes Relative: 9 %
Neutro Abs: 2.2 10*3/uL (ref 1.7–7.7)
Neutrophils Relative %: 27 %
Platelets: 249 10*3/uL (ref 150–400)
RBC: 4.42 MIL/uL (ref 4.22–5.81)
RDW: 13.1 % (ref 11.5–15.5)
WBC: 8.1 10*3/uL (ref 4.0–10.5)
nRBC: 0 % (ref 0.0–0.2)

## 2020-05-22 LAB — CBC
HCT: 38.8 % — ABNORMAL LOW (ref 39.0–52.0)
Hemoglobin: 12.9 g/dL — ABNORMAL LOW (ref 13.0–17.0)
MCH: 33.2 pg (ref 26.0–34.0)
MCHC: 33.2 g/dL (ref 30.0–36.0)
MCV: 99.7 fL (ref 80.0–100.0)
Platelets: 202 10*3/uL (ref 150–400)
RBC: 3.89 MIL/uL — ABNORMAL LOW (ref 4.22–5.81)
RDW: 13 % (ref 11.5–15.5)
WBC: 11.8 10*3/uL — ABNORMAL HIGH (ref 4.0–10.5)
nRBC: 0 % (ref 0.0–0.2)

## 2020-05-22 LAB — BASIC METABOLIC PANEL
Anion gap: 12 (ref 5–15)
BUN: 12 mg/dL (ref 6–20)
CO2: 25 mmol/L (ref 22–32)
Calcium: 8.3 mg/dL — ABNORMAL LOW (ref 8.9–10.3)
Chloride: 107 mmol/L (ref 98–111)
Creatinine, Ser: 0.86 mg/dL (ref 0.61–1.24)
GFR, Estimated: >60 mL/min (ref >60)
Glucose, Bld: 127 mg/dL — ABNORMAL HIGH (ref 70–99)
Potassium: 3.3 mmol/L — ABNORMAL LOW (ref 3.5–5.1)
Sodium: 144 mmol/L (ref 135–145)

## 2020-05-22 LAB — URINALYSIS, ROUTINE W REFLEX MICROSCOPIC
Bilirubin Urine: NEGATIVE
Glucose, UA: NEGATIVE mg/dL
Ketones, ur: NEGATIVE mg/dL
Leukocytes,Ua: NEGATIVE
Nitrite: NEGATIVE
Protein, ur: NEGATIVE mg/dL
Specific Gravity, Urine: 1.009 (ref 1.005–1.030)
pH: 6 (ref 5.0–8.0)

## 2020-05-22 LAB — RAPID URINE DRUG SCREEN, HOSP PERFORMED
Amphetamines: NOT DETECTED
Barbiturates: NOT DETECTED
Benzodiazepines: NOT DETECTED
Cocaine: NOT DETECTED
Opiates: NOT DETECTED
Tetrahydrocannabinol: NOT DETECTED

## 2020-05-22 LAB — COMPREHENSIVE METABOLIC PANEL
ALT: 17 U/L (ref 0–44)
AST: 40 U/L (ref 15–41)
Albumin: 3.7 g/dL (ref 3.5–5.0)
Alkaline Phosphatase: 45 U/L (ref 38–126)
Anion gap: 16 — ABNORMAL HIGH (ref 5–15)
BUN: 11 mg/dL (ref 6–20)
CO2: 21 mmol/L — ABNORMAL LOW (ref 22–32)
Calcium: 7.8 mg/dL — ABNORMAL LOW (ref 8.9–10.3)
Chloride: 106 mmol/L (ref 98–111)
Creatinine, Ser: 0.93 mg/dL (ref 0.61–1.24)
GFR, Estimated: >60 mL/min (ref >60)
Glucose, Bld: 117 mg/dL — ABNORMAL HIGH (ref 70–99)
Potassium: 3.1 mmol/L — ABNORMAL LOW (ref 3.5–5.1)
Sodium: 143 mmol/L (ref 135–145)
Total Bilirubin: 0.8 mg/dL (ref 0.3–1.2)
Total Protein: 6.5 g/dL (ref 6.5–8.1)

## 2020-05-22 LAB — RESP PANEL BY RT-PCR (FLU A&B, COVID) ARPGX2
Influenza A by PCR: NEGATIVE
Influenza B by PCR: NEGATIVE
SARS Coronavirus 2 by RT PCR: NEGATIVE

## 2020-05-22 LAB — C-REACTIVE PROTEIN: CRP: 0.6 mg/dL (ref <1.0)

## 2020-05-22 LAB — LACTIC ACID, PLASMA
Lactic Acid, Venous: 2.4 mmol/L (ref 0.5–1.9)
Lactic Acid, Venous: 4.1 mmol/L (ref 0.5–1.9)
Lactic Acid, Venous: 4.4 mmol/L (ref 0.5–1.9)

## 2020-05-22 LAB — HEPATIC FUNCTION PANEL
ALT: 20 U/L (ref 0–44)
AST: 25 U/L (ref 15–41)
Albumin: 4.3 g/dL (ref 3.5–5.0)
Alkaline Phosphatase: 58 U/L (ref 38–126)
Bilirubin, Direct: 0.1 mg/dL (ref 0.0–0.2)
Indirect Bilirubin: 0.2 mg/dL — ABNORMAL LOW (ref 0.3–0.9)
Total Bilirubin: 0.3 mg/dL (ref 0.3–1.2)
Total Protein: 7.5 g/dL (ref 6.5–8.1)

## 2020-05-22 LAB — AMMONIA: Ammonia: 39 umol/L — ABNORMAL HIGH (ref 9–35)

## 2020-05-22 LAB — PROTIME-INR
INR: 0.9 (ref 0.8–1.2)
Prothrombin Time: 11.4 seconds (ref 11.4–15.2)

## 2020-05-22 LAB — MRSA PCR SCREENING: MRSA by PCR: NEGATIVE

## 2020-05-22 LAB — BLOOD GAS, ARTERIAL
Acid-base deficit: 0.1 mmol/L (ref 0.0–2.0)
Bicarbonate: 24.1 mmol/L (ref 20.0–28.0)
FIO2: 40
O2 Saturation: 95.9 %
Patient temperature: 37
pCO2 arterial: 41.4 mmHg (ref 32.0–48.0)
pH, Arterial: 7.385 (ref 7.350–7.450)
pO2, Arterial: 101 mmHg (ref 83.0–108.0)

## 2020-05-22 LAB — GLUCOSE, CAPILLARY
Glucose-Capillary: 103 mg/dL — ABNORMAL HIGH (ref 70–99)
Glucose-Capillary: 84 mg/dL (ref 70–99)

## 2020-05-22 LAB — HIV ANTIBODY (ROUTINE TESTING W REFLEX): HIV Screen 4th Generation wRfx: NONREACTIVE

## 2020-05-22 LAB — LIPASE, BLOOD: Lipase: 53 U/L — ABNORMAL HIGH (ref 11–51)

## 2020-05-22 LAB — ACETAMINOPHEN LEVEL: Acetaminophen (Tylenol), Serum: <10 ug/mL — ABNORMAL LOW (ref 10–30)

## 2020-05-22 LAB — SALICYLATE LEVEL: Salicylate Lvl: <4 mg/dL — ABNORMAL LOW (ref 7.0–30.0)

## 2020-05-22 LAB — TROPONIN I (HIGH SENSITIVITY)
Troponin I (High Sensitivity): 2 ng/L (ref <18)
Troponin I (High Sensitivity): <2 ng/L (ref <18)

## 2020-05-22 LAB — ETHANOL
Alcohol, Ethyl (B): 386 mg/dL (ref <10)
Alcohol, Ethyl (B): 526 mg/dL (ref <10)

## 2020-05-22 LAB — PROCALCITONIN: Procalcitonin: <0.1 ng/mL

## 2020-05-22 LAB — BRAIN NATRIURETIC PEPTIDE: B Natriuretic Peptide: 16 pg/mL (ref 0.0–100.0)

## 2020-05-22 LAB — MAGNESIUM: Magnesium: 1.4 mg/dL — ABNORMAL LOW (ref 1.7–2.4)

## 2020-05-22 LAB — BETA-HYDROXYBUTYRIC ACID: Beta-Hydroxybutyric Acid: 0.07 mmol/L (ref 0.05–0.27)

## 2020-05-22 MED ORDER — THIAMINE HCL 100 MG/ML IJ SOLN
100.0000 mg | Freq: Every day | INTRAMUSCULAR | Status: DC
  Administered 2020-05-22 – 2020-05-23 (×2): 100 mg via INTRAVENOUS
  Filled 2020-05-22 (×2): qty 2

## 2020-05-22 MED ORDER — ALBUTEROL SULFATE (2.5 MG/3ML) 0.083% IN NEBU
INHALATION_SOLUTION | RESPIRATORY_TRACT | Status: AC
  Administered 2020-05-22: 5 mg
  Filled 2020-05-22: qty 6

## 2020-05-22 MED ORDER — CHLORHEXIDINE GLUCONATE 0.12% ORAL RINSE (MEDLINE KIT)
15.0000 mL | Freq: Two times a day (BID) | OROMUCOSAL | Status: DC
  Administered 2020-05-22 – 2020-05-23 (×2): 15 mL via OROMUCOSAL

## 2020-05-22 MED ORDER — MAGNESIUM SULFATE 4 GM/100ML IV SOLN
4.0000 g | Freq: Once | INTRAVENOUS | Status: AC
  Administered 2020-05-22: 4 g via INTRAVENOUS
  Filled 2020-05-22: qty 100

## 2020-05-22 MED ORDER — POTASSIUM CHLORIDE 10 MEQ/100ML IV SOLN
10.0000 meq | INTRAVENOUS | Status: AC
  Administered 2020-05-22 (×2): 10 meq via INTRAVENOUS
  Filled 2020-05-22 (×2): qty 100

## 2020-05-22 MED ORDER — FENTANYL CITRATE (PF) 100 MCG/2ML IJ SOLN
200.0000 ug | Freq: Once | INTRAMUSCULAR | Status: AC
  Administered 2020-05-22: 200 ug via INTRAVENOUS
  Filled 2020-05-22: qty 4

## 2020-05-22 MED ORDER — LORAZEPAM 2 MG/ML IJ SOLN: 0.0000 mg | Freq: Two times a day (BID) | INTRAMUSCULAR | Status: DC

## 2020-05-22 MED ORDER — SODIUM CHLORIDE 0.9 % IV BOLUS
1000.0000 mL | Freq: Once | INTRAVENOUS | Status: AC
  Administered 2020-05-22: 1000 mL via INTRAVENOUS

## 2020-05-22 MED ORDER — CHLORHEXIDINE GLUCONATE CLOTH 2 % EX PADS
6.0000 | MEDICATED_PAD | Freq: Every day | CUTANEOUS | Status: DC
  Administered 2020-05-22 – 2020-05-24 (×3): 6 via TOPICAL

## 2020-05-22 MED ORDER — HEPARIN SODIUM (PORCINE) 5000 UNIT/ML IJ SOLN
5000.0000 [IU] | Freq: Three times a day (TID) | INTRAMUSCULAR | Status: DC
  Administered 2020-05-22 – 2020-05-25 (×10): 5000 [IU] via SUBCUTANEOUS
  Filled 2020-05-22 (×10): qty 1

## 2020-05-22 MED ORDER — LORAZEPAM 1 MG PO TABS: 0.0000 mg | ORAL_TABLET | Freq: Two times a day (BID) | ORAL | Status: DC

## 2020-05-22 MED ORDER — LORAZEPAM 2 MG/ML IJ SOLN
0.0000 mg | Freq: Four times a day (QID) | INTRAMUSCULAR | Status: DC
  Administered 2020-05-22: 2 mg via INTRAVENOUS
  Filled 2020-05-22: qty 1

## 2020-05-22 MED ORDER — DEXTROSE-NACL 5-0.9 % IV SOLN: INTRAVENOUS | Status: DC

## 2020-05-22 MED ORDER — ORAL CARE MOUTH RINSE
15.0000 mL | OROMUCOSAL | Status: DC
  Administered 2020-05-22 – 2020-05-23 (×8): 15 mL via OROMUCOSAL

## 2020-05-22 MED ORDER — PROPOFOL 1000 MG/100ML IV EMUL
5.0000 ug/kg/min | INTRAVENOUS | Status: DC
Start: 2020-05-22 — End: 2020-05-22

## 2020-05-22 MED ORDER — ACETAMINOPHEN 325 MG PO TABS
650.0000 mg | ORAL_TABLET | Freq: Four times a day (QID) | ORAL | Status: DC | PRN
  Filled 2020-05-22: qty 2

## 2020-05-22 MED ORDER — IPRATROPIUM BROMIDE 0.02 % IN SOLN
RESPIRATORY_TRACT | Status: AC
  Administered 2020-05-22: 0.5 mg
  Filled 2020-05-22: qty 2.5

## 2020-05-22 MED ORDER — PROPOFOL 1000 MG/100ML IV EMUL
INTRAVENOUS | Status: AC
  Administered 2020-05-22: 5 ug/kg/min via INTRAVENOUS
  Filled 2020-05-22: qty 100

## 2020-05-22 MED ORDER — LORAZEPAM 2 MG/ML IJ SOLN
2.0000 mg | Freq: Once | INTRAMUSCULAR | Status: AC
  Administered 2020-05-22: 2 mg via INTRAVENOUS
  Filled 2020-05-22: qty 1

## 2020-05-22 MED ORDER — ACETAMINOPHEN 650 MG RE SUPP: 650.0000 mg | Freq: Four times a day (QID) | RECTAL | Status: DC | PRN

## 2020-05-22 MED ORDER — SODIUM CHLORIDE 0.9 % IV SOLN: INTRAVENOUS | Status: DC

## 2020-05-22 MED ORDER — DEXMEDETOMIDINE HCL IN NACL 400 MCG/100ML IV SOLN
0.4000 ug/kg/h | INTRAVENOUS | Status: DC
  Administered 2020-05-22 (×2): 0.4 ug/kg/h via INTRAVENOUS
  Administered 2020-05-23: 0.6 ug/kg/h via INTRAVENOUS
  Administered 2020-05-23: 1 ug/kg/h via INTRAVENOUS
  Filled 2020-05-22 (×5): qty 100

## 2020-05-22 MED ORDER — LORAZEPAM 1 MG PO TABS
0.0000 mg | ORAL_TABLET | Freq: Four times a day (QID) | ORAL | Status: DC
  Administered 2020-05-23: 1 mg via ORAL
  Filled 2020-05-22: qty 1

## 2020-05-22 MED ORDER — THIAMINE HCL 100 MG PO TABS
100.0000 mg | ORAL_TABLET | Freq: Every day | ORAL | Status: DC
  Administered 2020-05-24 – 2020-05-25 (×2): 100 mg via ORAL
  Filled 2020-05-22 (×2): qty 1

## 2020-05-22 MED ORDER — ALBUTEROL SULFATE (2.5 MG/3ML) 0.083% IN NEBU
2.5000 mg | INHALATION_SOLUTION | Freq: Four times a day (QID) | RESPIRATORY_TRACT | Status: DC | PRN
  Administered 2020-05-23: 2.5 mg via RESPIRATORY_TRACT
  Filled 2020-05-22: qty 3

## 2020-05-22 MED ORDER — FENTANYL 2500MCG IN NS 250ML (10MCG/ML) PREMIX INFUSION
0.0000 ug/h | INTRAVENOUS | Status: DC
  Administered 2020-05-22: 150 ug/h via INTRAVENOUS
  Administered 2020-05-22: 25 ug/h via INTRAVENOUS
  Administered 2020-05-23: 225 ug/h via INTRAVENOUS
  Filled 2020-05-22 (×3): qty 250

## 2020-05-22 NOTE — Progress Notes (Signed)
eLink Physician-Brief Progress Note Patient Name: Christian King DOB: 1970-02-25 MRN: [id number]   Date of Service  05/22/2020  HPI/Events of Note  Brief new admit note:  50 y.o. male, with history of hypothyroidism, hypertension, hyperlipidemia, GERD, B12 deficiency, and alcohol abuse presents to the ED with a chief complaint of difficulty breathing.  Upon arrival patient was not protecting his airway and had been bag-valve-mask assisted breathing with EMS in route.  Intubated in ED.    Data: Alcohol level 526 white blood cell count of 8.1, hemoglobin 14.5 Chemistry panel reveals a hypokalemia at 3.3, lipase 53, ammonia 39, Trope less than 2 Salicylate level and Tylenol level are undetectable UDS is negative Chest x-ray: film reviewed:  shows no acute cardiopulmonary disease, ET tube 3.7 cm above carina CT head shows no acute intracranial abnormalities EKG shows sinus bradycardia with a heart rate of 49 and a QTC of 403 ABG shows a pH of 7.38, CO2 41, O2 101  Flu/covid neg.   Camera: 520 ( VT 42ml/ibw )/5/22/100%. Afebrile. On precedex/fenta sedation. In synchrony Nothing needed at this time as per bed side RN discussion  A/P; 1. Alcohol intoxication. Low GCS. Resp failure on Vent.CTH neg 2. Electrolyte imbalance.   eICU Interventions  - continue current care - sz precautions. Ativan prn - Venous duplex leg to r/o DVT - VAP bundle. On sq heparin - lung protective ventilation - CBG goals < 180 - CIWA protocol. Received thiamine.  - SAT/SBT daily weaning trials.     Intervention Category Major Interventions: Respiratory failure - evaluation and management;Change in mental status - evaluation and management Intermediate Interventions: Electrolyte abnormality - evaluation and management Evaluation Type: New Patient Evaluation  [name] 05/22/2020, 5:07 AM

## 2020-05-22 NOTE — Progress Notes (Signed)
Alcohol level 386 still high but dropping.

## 2020-05-22 NOTE — H&P (Addendum)
TRH H&P    Patient Demographics:    Christian King, is a 50 y.o. male  MRN: [id number]  DOB - 09-26-1970  Admit Date - 05/21/2020  Referring MD/NP/PA: Blinda Leatherwood  Outpatient Primary MD for the patient is Bennie Pierini, FNP  Patient coming from: Home  Chief complaint- difficulty breathing    HPI:    Christian King  is a 50 y.o. male, with history of hypothyroidism, hypertension, hyperlipidemia, GERD, B12 deficiency, and alcohol abuse presents to the ED with a chief complaint of difficulty breathing.  Upon arrival patient was not protecting his airway and had been bag-valve-mask assisted breathing with EMS in route.  He was intubated upon arrival and therefore is not able to provide any history.  Wife had reported that he drinks heavily (binges) and had been drinking today.  She reports that he hadn't drank today as much as he normally does.  She heard him vomit and then it sounded like he was having difficulty breathing so she called EMS.  ED provider called critical care at [facility] to inquire about starting antibiotics given possible aspiration, but critical care has advised against it unless he starts showing signs of infection.  Wife does report that patient has been complaining about right lower extremity pain for 3 weeks and does have a history of DVT.   In the ED Temp 97.7, heart rate 58, respiratory rate 22, blood pressure 98/72, satting 98% on ventilator Alcohol level 526 Salicylate level and Tylenol level are undetectable UDS is negative Chest x-ray shows no acute cardiopulmonary disease, ET tube 3.7 cm above carina CT head shows no acute intracranial abnormalities EKG shows sinus bradycardia with a heart rate of 49 and a QTC of 403 ABG shows a pH of 7.38, CO2 41, O2 101 Respiratory panel is pending No leukocytosis with a white blood cell count of 8.1, hemoglobin 14.5 Chemistry panel  reveals a hypokalemia at 3.3, lipase 53, ammonia 39, Trope less than 2 CIWA protocol was started in the ED, fentanyl drip and propofol for sedation, 1 L NaCl Admission to ICU   Review of systems:    Review of systems could not be completed secondary to patient being intubated   Past History of the following :    Past Medical History:  Diagnosis Date  . Alcohol abuse   . B12 deficiency   . Blood dyscrasia    hemachromatosis  . Bronchitis   . Degenerative joint disease (DJD) of hip   . GERD (gastroesophageal reflux disease)   . Gout   . History of kidney stones   . HLD (hyperlipidemia)   . HTN (hypertension)   . Hypothyroidism   . Ventricular tachycardia (HCC)    in setting of hypokalemia/ hypomagnesemia and ETOH  . Wears glasses       Past Surgical History:  Procedure Laterality Date  . TOTAL HIP ARTHROPLASTY Right   . TOTAL HIP ARTHROPLASTY Left 03/16/2016   Procedure: TOTAL HIP ARTHROPLASTY;  Surgeon: Frederico Hamman, MD;  Location: [facility] OR;  Service: Orthopedics;  Laterality: Left;  .  vascularized fibular graft     right hip for avascular necrosis      Social History:      Social History   Tobacco Use  . Smoking status: Current Every Day Smoker    Packs/day: 0.25    Types: Cigarettes    Start date: 05/05/1985  . Smokeless tobacco: Never Used  Substance Use Topics  . Alcohol use: Yes    Comment: daily       Family History :     Family History  Problem Relation Age of Onset  . Hypertension Father   . Diabetes Maternal Grandmother   . Hypertension Paternal Grandmother   . Stroke Paternal Grandmother       Home Medications:   Prior to Admission medications   Medication Sig Start Date End Date Taking? Authorizing Provider  aspirin 325 MG tablet Take 325 mg by mouth daily.    [provider]  fenofibrate 54 MG tablet Take 1 tablet by mouth  daily 02/08/20   Daphine DeutscherMartin, Mary-Margaret, FNP  magnesium oxide (MAG-OX) 400 MG tablet Take 1 tablet  (400 mg total) by mouth daily. 02/08/20   Daphine DeutscherMartin, Mary-Margaret, FNP  metoprolol succinate (TOPROL-XL) 50 MG 24 hr tablet TAKE 1 TABLET BY MOUTH ONCE DAILY WITH A MEAL OR IMMEDIATELY AFTER A MEAL, 02/08/20   Daphine DeutscherMartin, Mary-Margaret, FNP  potassium chloride (KLOR-CON M10) 10 MEQ tablet TAKE 1 TABLET BY MOUTH EVERY DAY 02/08/20   Daphine DeutscherMartin, Mary-Margaret, FNP  sildenafil (REVATIO) 20 MG tablet TAKE TWO TO FIVE TABLETS BY MOUTH AS NEEDED FOR SEX 01/30/19   Daphine DeutscherMartin, Mary-Margaret, FNP  simvastatin (ZOCOR) 40 MG tablet Take 1 tablet (40 mg total) by mouth daily. 02/08/20   Bennie PieriniMartin, Mary-Margaret, FNP     Allergies:     Allergies  Allergen Reactions  . Azithromycin Other (See Comments)    History of QT prolongation  . Amoxicillin Hives and Itching  . Hctz [Hydrochlorothiazide] Rash  . Tramadol Itching     Physical Exam:   Vitals  Blood pressure 96/66, pulse 65, temperature 97.7 F (36.5 C), temperature source Oral, resp. rate (!) 24, height 5\' 7"  (1.702 m), weight 91.2 kg, SpO2 100 %.  1.  General:  Laying supine in bed, fighting the sedation at first entry into room, resting after fentanyl bolus  2. Psychiatric: Could not assess, patient is intubated  3. Neurologic: Sedated, not following commands after fentanyl bolus. Was moving all 4 extremities prior to bolus  4. HEENMT:  Head is atraumatic, normocephalic, pupils reactive, neck is supple, trachea is midline, mucous membranes moist  5. Respiratory : Lungs clear to ausculation BL, Maintaining sats on vent  6. Cardiovascular : Heart rate is normal, rhythm is regular, no murmurs, rubs or gallops  7. Gastrointestinal:  Abdomen is soft, nondistended, no grimace with palpation, no palpable masses  8. Skin:  Skin is warm, dry, and intact, no acute lesions on limited exam  9.Musculoskeletal:  No peripheral edema, no unilateral lower extremity edema, peripheral pulses palpated.     Data Review:    CBC Recent Labs  Lab  05/21/20 2352  WBC 8.1  HGB 14.5  HCT 44.5  PLT 249  MCV 100.7*  MCH 32.8  MCHC 32.6  RDW 13.1  LYMPHSABS 4.9*  MONOABS 0.8  EOSABS 0.2  BASOSABS 0.1   ------------------------------------------------------------------------------------------------------------------  Results for orders placed or performed during the hospital encounter of 05/21/20 (from the past 48 hour(s))  CBC with Differential/Platelet     Status: Abnormal  Collection Time: 05/21/20 11:52 PM  Result Value Ref Range   WBC 8.1 4.0 - 10.5 K/uL   RBC 4.42 4.22 - 5.81 MIL/uL   Hemoglobin 14.5 13.0 - 17.0 g/dL   HCT 62.1 30.8 - 65.7 %   MCV 100.7 (H) 80.0 - 100.0 fL   MCH 32.8 26.0 - 34.0 pg   MCHC 32.6 30.0 - 36.0 g/dL   RDW 84.6 96.2 - 95.2 %   Platelets 249 150 - 400 K/uL   nRBC 0.0 0.0 - 0.2 %   Neutrophils Relative % 27 %   Neutro Abs 2.2 1.7 - 7.7 K/uL   Lymphocytes Relative 60 %   Lymphs Abs 4.9 (H) 0.7 - 4.0 K/uL   Monocytes Relative 9 %   Monocytes Absolute 0.8 0.1 - 1.0 K/uL   Eosinophils Relative 2 %   Eosinophils Absolute 0.2 0.0 - 0.5 K/uL   Basophils Relative 1 %   Basophils Absolute 0.1 0.0 - 0.1 K/uL   Immature Granulocytes 1 %   Abs Immature Granulocytes 0.05 0.00 - 0.07 K/uL    Comment: Performed at [facility], [address], [place], [place] [address]  Basic metabolic panel     Status: Abnormal   Collection Time: 05/21/20 11:52 PM  Result Value Ref Range   Sodium 144 135 - 145 mmol/L   Potassium 3.3 (L) 3.5 - 5.1 mmol/L   Chloride 107 98 - 111 mmol/L   CO2 25 22 - 32 mmol/L   Glucose, Bld 127 (H) 70 - 99 mg/dL    Comment: Glucose reference range applies only to samples taken after fasting for at least 8 hours.   BUN 12 6 - 20 mg/dL   Creatinine, Ser 4.40 0.61 - 1.24 mg/dL   Calcium 8.3 (L) 8.9 - 10.3 mg/dL   GFR, Estimated >10 >27 mL/min    Comment: (NOTE) Calculated using the CKD-EPI Creatinine Equation (2021)    Anion gap 12 5 - 15    Comment: Performed at [facility], [address] [place], [place] [address]  Hepatic function panel     Status: Abnormal   Collection Time: 05/21/20 11:52 PM  Result Value Ref Range   Total Protein 7.5 6.5 - 8.1 g/dL   Albumin 4.3 3.5 - 5.0 g/dL   AST 25 15 - 41 U/L   ALT 20 0 - 44 U/L   Alkaline Phosphatase 58 38 - 126 U/L   Total Bilirubin 0.3 0.3 - 1.2 mg/dL   Bilirubin, Direct 0.1 0.0 - 0.2 mg/dL   Indirect Bilirubin 0.2 (L) 0.3 - 0.9 mg/dL    Comment: Performed at [facility], [address], [place], [place] [address]  Troponin I (High Sensitivity)     Status: None   Collection Time: 05/21/20 11:52 PM  Result Value Ref Range   Troponin I (High Sensitivity) <2 <18 ng/L    Comment: (NOTE) Elevated high sensitivity troponin I (hsTnI) values and significant  changes across serial measurements may suggest ACS but many other  chronic and acute conditions are known to elevate hsTnI results.  Refer to the "Links" section for chest pain algorithms and additional  guidance. Performed at [facility], [address] [place], [place] [address]   Brain natriuretic peptide     Status: None   Collection Time: 05/21/20 11:52 PM  Result Value Ref Range   B Natriuretic Peptide 16.0 0.0 - 100.0 pg/mL    Comment: Performed at [facility], [address] [place], [place]  [address]  Lactic acid, plasma     Status: Abnormal   Collection Time: 05/21/20 11:52 PM  Result Value Ref Range   Lactic Acid, Venous 2.4 (HH) 0.5 - 1.9 mmol/L    Comment: CRITICAL RESULT CALLED TO, READ BACK BY AND VERIFIED WITH: SAPPELT,J @ 0032 ON 05/22/20 BY JUW Performed at [facility], [address] [place], [place] [address]   Lipase, blood     Status: Abnormal   Collection Time: 05/21/20 11:52 PM  Result Value Ref Range   Lipase 53 (H) 11 - 51 U/L    Comment: Performed at [facility], [address] [place], [place] [address]  Ethanol     Status: Abnormal   Collection Time: 05/21/20 11:52 PM  Result Value Ref Range   Alcohol, Ethyl (B)  526 (HH) <10 mg/dL    Comment: CRITICAL RESULT CALLED TO, READ BACK BY AND VERIFIED WITH: SAPPELT,J @ 0030 ON 05/22/20 BY JUW (NOTE) Lowest detectable limit for serum alcohol is 10 mg/dL.  For medical purposes only. Performed at [facility], [address], [place], [place] [address]   Beta-hydroxybutyric acid     Status: None   Collection Time: 05/21/20 11:52 PM  Result Value Ref Range   Beta-Hydroxybutyric Acid 0.07 0.05 - 0.27 mmol/L    Comment: Performed at [facility], [address], [place], [place] [address]  Ammonia     Status: Abnormal   Collection Time: 05/21/20 11:52 PM  Result Value Ref Range   Ammonia 39 (H) 9 - 35 umol/L    Comment: Performed at [facility], [address] [place], [place] [address]  Salicylate level     Status: Abnormal   Collection Time: 05/21/20 11:52 PM  Result Value Ref Range   Salicylate Lvl <4.0 (L) 7.0 - 30.0 mg/dL    Comment: Performed at [facility], [address], [place], [place] [address]  Acetaminophen level     Status: Abnormal   Collection Time: 05/21/20 11:52 PM  Result Value Ref Range   Acetaminophen (Tylenol), Serum <10 (L) 10 - 30 ug/mL    Comment: (NOTE) Therapeutic concentrations vary significantly. A range of 10-30 ug/mL  may be an effective concentration for many patients. However, some  are best treated at concentrations outside of this range. Acetaminophen concentrations >150 ug/mL at 4 hours after ingestion  and >50 ug/mL at 12 hours after ingestion are often associated with  toxic reactions.  Performed at [facility], [address], [place], [place] [address]   Protime-INR     Status: None   Collection Time: 05/21/20 11:52 PM  Result Value Ref Range   Prothrombin Time 11.4 11.4 - 15.2 seconds   INR 0.9 0.8 - 1.2    Comment: (NOTE) INR goal varies based on device and disease states. Performed at [facility], [address] [place], [place] [address]   Blood gas, arterial     Status: None   Collection Time:  05/22/20 12:41 AM  Result Value Ref Range   FIO2 40.00    pH, Arterial 7.385 7.350 - 7.450   pCO2 arterial 41.4 32.0 - 48.0 mmHg   pO2, Arterial 101 83.0 - 108.0 mmHg   Bicarbonate 24.1 20.0 - 28.0 mmol/L   Acid-base deficit 0.1 0.0 - 2.0 mmol/L   O2 Saturation 95.9 %   Patient temperature 37.0    Allens test (pass/fail) PASS PASS    Comment: Performed at [facility], [address], [place], [place] [address]  Rapid urine  drug screen (hospital performed)     Status: None   Collection Time: 05/22/20 12:43 AM  Result Value Ref Range   Opiates NONE DETECTED NONE DETECTED   Cocaine NONE DETECTED NONE DETECTED   Benzodiazepines NONE DETECTED NONE DETECTED   Amphetamines NONE DETECTED NONE DETECTED   Tetrahydrocannabinol NONE DETECTED NONE DETECTED   Barbiturates NONE DETECTED NONE DETECTED    Comment: (NOTE) DRUG SCREEN FOR MEDICAL PURPOSES ONLY.  IF CONFIRMATION IS NEEDED FOR ANY PURPOSE, NOTIFY LAB WITHIN 5 DAYS.  LOWEST DETECTABLE LIMITS FOR URINE DRUG SCREEN Drug Class                     Cutoff (ng/mL) Amphetamine and metabolites    1000 Barbiturate and metabolites    200 Benzodiazepine                 200 Tricyclics and metabolites     300 Opiates and metabolites        300 Cocaine and metabolites        300 THC                            50 Performed at [facility], [address], [place], [place] [address]   Urinalysis, Routine w reflex microscopic Urine, Catheterized     Status: Abnormal   Collection Time: 05/22/20 12:44 AM  Result Value Ref Range   Color, Urine STRAW (A) YELLOW   APPearance CLEAR CLEAR   Specific Gravity, Urine 1.009 1.005 - 1.030   pH 6.0 5.0 - 8.0   Glucose, UA NEGATIVE NEGATIVE mg/dL   Hgb urine dipstick SMALL (A) NEGATIVE   Bilirubin Urine NEGATIVE NEGATIVE   Ketones, ur NEGATIVE NEGATIVE mg/dL   Protein, ur NEGATIVE NEGATIVE mg/dL   Nitrite NEGATIVE NEGATIVE   Leukocytes,Ua NEGATIVE NEGATIVE   RBC / HPF 0-5 0 - 5 RBC/hpf   WBC, UA 0-5  0 - 5 WBC/hpf   Bacteria, UA RARE (A) NONE SEEN   Mucus PRESENT     Comment: Performed at [facility], [address], [place], [place] [address]  Resp Panel by RT-PCR (Flu A&B, Covid) Nasopharyngeal Swab     Status: None   Collection Time: 05/22/20  1:30 AM   Specimen: Nasopharyngeal Swab; Nasopharyngeal(NP) swabs in vial transport medium  Result Value Ref Range   SARS Coronavirus 2 by RT PCR NEGATIVE NEGATIVE    Comment: (NOTE) SARS-CoV-2 target nucleic acids are NOT DETECTED.  The SARS-CoV-2 RNA is generally detectable in upper respiratory specimens during the acute phase of infection. The lowest concentration of SARS-CoV-2 viral copies this assay can detect is 138 copies/mL. A negative result does not preclude SARS-Cov-2 infection and should not be used as the sole basis for treatment or other patient management decisions. A negative result may occur with  improper specimen collection/handling, submission of specimen other than nasopharyngeal swab, presence of viral mutation(s) within the areas targeted by this assay, and inadequate number of viral copies(<138 copies/mL). A negative result must be combined with clinical observations, patient history, and epidemiological information. The expected result is Negative.  Fact Sheet for Patients:  BloggerCourse.com  Fact Sheet for Healthcare Providers:  SeriousBroker.it  This test is no t yet approved or cleared by the [place] FDA and  has been authorized for detection and/or diagnosis of SARS-CoV-2 by FDA under an Emergency Use Authorization (EUA). This EUA will remain  in effect (meaning this test  can be used) for the duration of the COVID-19 declaration under Section 564(b)(1) of the Act, 21 U.S.C.section 360bbb-3(b)(1), unless the authorization is terminated  or revoked sooner.       Influenza A by PCR NEGATIVE NEGATIVE   Influenza B by PCR NEGATIVE NEGATIVE     Comment: (NOTE) The Xpert Xpress SARS-CoV-2/FLU/RSV plus assay is intended as an aid in the diagnosis of influenza from Nasopharyngeal swab specimens and should not be used as a sole basis for treatment. Nasal washings and aspirates are unacceptable for Xpert Xpress SARS-CoV-2/FLU/RSV testing.  Fact Sheet for Patients: BloggerCourse.com  Fact Sheet for Healthcare Providers: SeriousBroker.it  This test is not yet approved or cleared by the [place] FDA and has been authorized for detection and/or diagnosis of SARS-CoV-2 by FDA under an Emergency Use Authorization (EUA). This EUA will remain in effect (meaning this test can be used) for the duration of the COVID-19 declaration under Section 564(b)(1) of the Act, 21 U.S.C. section 360bbb-3(b)(1), unless the authorization is terminated or revoked.  Performed at [facility], [address] [place], [place] [address]     Chemistries  Recent Labs  Lab 05/21/20 2352  NA 144  K 3.3*  CL 107  CO2 25  GLUCOSE 127*  BUN 12  CREATININE 0.86  CALCIUM 8.3*  AST 25  ALT 20  ALKPHOS 58  BILITOT 0.3   ------------------------------------------------------------------------------------------------------------------  ------------------------------------------------------------------------------------------------------------------ GFR: Estimated Creatinine Clearance: 108.2 mL/min (by C-G formula based on SCr of 0.86 mg/dL). Liver Function Tests: Recent Labs  Lab 05/21/20 2352  AST 25  ALT 20  ALKPHOS 58  BILITOT 0.3  PROT 7.5  ALBUMIN 4.3   Recent Labs  Lab 05/21/20 2352  LIPASE 53*   Recent Labs  Lab 05/21/20 2352  AMMONIA 39*   Coagulation Profile: Recent Labs  Lab 05/21/20 2352  INR 0.9   Cardiac Enzymes: No results for input(s): CKTOTAL, CKMB, CKMBINDEX, TROPONINI in the last 168 hours. BNP (last 3 results) No results for input(s): PROBNP in the last  8760 hours. HbA1C: No results for input(s): HGBA1C in the last 72 hours. CBG: No results for input(s): GLUCAP in the last 168 hours. Lipid Profile: No results for input(s): CHOL, HDL, LDLCALC, TRIG, CHOLHDL, LDLDIRECT in the last 72 hours. Thyroid Function Tests: No results for input(s): TSH, T4TOTAL, FREET4, T3FREE, THYROIDAB in the last 72 hours. Anemia Panel: No results for input(s): VITAMINB12, FOLATE, FERRITIN, TIBC, IRON, RETICCTPCT in the last 72 hours.  --------------------------------------------------------------------------------------------------------------- Urine analysis:    Component Value Date/Time   COLORURINE STRAW (A) 05/22/2020 0044   APPEARANCEUR CLEAR 05/22/2020 0044   LABSPEC 1.009 05/22/2020 0044   PHURINE 6.0 05/22/2020 0044   GLUCOSEU NEGATIVE 05/22/2020 0044   HGBUR SMALL (A) 05/22/2020 0044   BILIRUBINUR NEGATIVE 05/22/2020 0044   KETONESUR NEGATIVE 05/22/2020 0044   PROTEINUR NEGATIVE 05/22/2020 0044   NITRITE NEGATIVE 05/22/2020 0044   LEUKOCYTESUR NEGATIVE 05/22/2020 0044      Imaging Results:    CT HEAD WO CONTRAST  Result Date: 05/22/2020 CLINICAL DATA:  Unresponsive. Mental status change of unknown cause. Alcohol use. EXAM: CT HEAD WITHOUT CONTRAST TECHNIQUE: Contiguous axial images were obtained from the base of the skull through the vertex without intravenous contrast. COMPARISON:  None. FINDINGS: Brain: No evidence of acute infarction, hemorrhage, hydrocephalus, extra-axial collection or mass lesion/mass effect. Mild diffuse cerebral atrophy. Vascular: No hyperdense vessel or unexpected calcification. Skull: Normal. Negative for fracture or focal lesion. Sinuses/Orbits: No acute finding. Other: None. IMPRESSION: 1.  No acute intracranial abnormalities. 2. Mild diffuse cerebral atrophy. Electronically Signed   By: Burman Nieves M.D.   On: 05/22/2020 01:14   DG Chest Portable 1 View  Result Date: 05/22/2020 CLINICAL DATA:  Post intubation  EXAM: PORTABLE CHEST 1 VIEW COMPARISON:  05/15/2016 FINDINGS: Endotracheal tube 3.7 cm above the carina. NG tube is in the stomach. Heart is normal size. Lungs clear. No effusions or acute bony abnormality. IMPRESSION: No acute cardiopulmonary disease. Electronically Signed   By: Charlett Nose M.D.   On: 05/22/2020 00:25    My personal review of EKG: Rhythm sinus bradycardia 49/min, QTc 403,no Acute ST changes   Assessment & Plan:    Active Problems:   Alcohol intoxication (HCC)   1. Alcohol intoxication and 2. Alcohol abuse 1. Alcohol level 526, CIWA protocol started 2. Patient given 1 L NaCl in ED 3. Continue to monitor electrolytes and replace as needed 4. Switching propofol to Precedex drip for better control of withdrawal symptoms 3. Acute respiratory failure 1. Would [place] at home was likely agonal breathing, patient requiring bag-valve-mask assisted breaths on the way here 2. Was unable to protect airway 3. Patient currently saturating well on ventilator 4. Respiratory failure secondary to altered mental status see below 4. Altered mental status 1. Secondary to intoxication 2. Obtunded/unresponsive at arrival 3. CT head without acute abnormality 4. Blood gases normal 5. Tylenol and salicylate level undetectable 6. No signs of infection 7. Ammonia slightly elevated but not enough to cause this level of change in mental status 8. Continue treatments as above and continue to monitor 5. Hypokalemia 1. Replace and recheck 6. Hypertension 1. Holding metoprolol and antihypertensive medications given soft blood pressure 98/72 2. We will add as needed medication if blood pressure starts trending up 7. Hypothyroidism 1. Last TSH was in December 2021 was 3.89, free T4 was 2.2 at that time 2. Patient is not currently on Synthroid 3. Check TSH in the a.m. 8. Sinus bradycardia 1. Likely to above pathologies 2. Treat as above and continue to monitor on telemetry 9. 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 [place] in the AM   DVT Prophylaxis-Heparin- SCDs   AM Labs Ordered, also please review Full Orders  Family Communication: Admission, patients condition and plan of care including tests being ordered have been discussed with the patient and wife who indicate understanding and agree with the plan and Code Status.  Code Status: Full Admission status: Inpatient :The appropriate admission status for this patient is INPATIENT. Inpatient status is judged to be reasonable and necessary in order to provide the required intensity of service to ensure the patient's safety. The patient's presenting symptoms, physical exam findings, and initial radiographic and laboratory data in the context of their chronic comorbidities is felt to place them at high risk for further clinical deterioration. Furthermore, it is not anticipated that the patient will be medically stable for discharge from the hospital within 2 midnights of admission. The following factors support the admission status of inpatient.     The patient's presenting symptoms include difficulty breathing The worrisome physical exam findings include not protecting airway at presentation, req intubation The initial radiographic and laboratory data are worrisome because of EtOH 526 The chronic co-morbidities include Hypothyroidism, HTN,        * I certify that at the point of admission it is my clinical judgment that the patient will require inpatient hospital care spanning beyond 2 midnights from  the point of admission due to high intensity of service, high risk for further deterioration and high frequency of surveillance required.*  Time spent in minutes : 65   Quintel Mccalla B Zierle-Ghosh DO

## 2020-05-22 NOTE — ED Notes (Signed)
Date and time results received: 05/22/20 0032   Test: LACTIC Critical Value: 2.4  Name of Provider Notified: Blinda Leatherwood, MD

## 2020-05-22 NOTE — Progress Notes (Signed)
ASSUMPTION OF CARE NOTE   05/22/2020 3:12 PM  Christian King was seen and examined.  The H&P by the admitting provider, orders, imaging was reviewed.  Please see new orders.  Will continue to follow. Pt remains critically ill on ventilator.  Continue current vent settings today.  Continue heavy sedation on precedex and fentanyl for today.  Continue IV fluid hydration and follow serum alcohol level.  It is slowly trending down.  Discussed with wife and brother in law at bedside.  He is binge alcohol drinker and usually consumes large amounts at a time and wife says he "sneaks" alcohol at times.  Last night she noticed that he had been able to sneak " an extra 5th of vodka" but unsure of how much he actually consumed.  He had not been interested in alcohol treatment per wife.  Does not believe that this was an intentional overdose.    Vitals:   05/22/20 1400 05/22/20 1500  BP: 95/67 94/68  Pulse: 72 71  Resp: (!) 24 (!) 24  Temp:    SpO2: 98% 98%    Results for orders placed or performed during the hospital encounter of 05/21/20  Resp Panel by RT-PCR (Flu A&B, Covid) Nasopharyngeal Swab   Specimen: Nasopharyngeal Swab; Nasopharyngeal(NP) swabs in vial transport medium  Result Value Ref Range   SARS Coronavirus 2 by RT PCR NEGATIVE NEGATIVE   Influenza A by PCR NEGATIVE NEGATIVE   Influenza B by PCR NEGATIVE NEGATIVE  MRSA PCR Screening   Specimen: Nasal Mucosa; Nasopharyngeal  Result Value Ref Range   MRSA by PCR NEGATIVE NEGATIVE  Culture, blood (Routine X 2) w Reflex to ID Panel   Specimen: BLOOD RIGHT ARM  Result Value Ref Range   Specimen Description      BLOOD RIGHT ARM BOTTLES DRAWN AEROBIC AND ANAEROBIC   Special Requests      Blood Culture results may not be optimal due to an excessive volume of blood received in culture bottles   Culture      NO GROWTH < 12 HOURS Performed at [facility], [address] [place], [place] [address]    Report Status PENDING   Culture,  blood (Routine X 2) w Reflex to ID Panel   Specimen: BLOOD RIGHT HAND  Result Value Ref Range   Specimen Description      BLOOD RIGHT HAND BOTTLES DRAWN AEROBIC AND ANAEROBIC   Special Requests      Blood Culture results may not be optimal due to an excessive volume of blood received in culture bottles   Culture      NO GROWTH < 12 HOURS Performed at [facility], [address], [place], [place] [address]    Report Status PENDING   CBC with Differential/Platelet  Result Value Ref Range   WBC 8.1 4.0 - 10.5 K/uL   RBC 4.42 4.22 - 5.81 MIL/uL   Hemoglobin 14.5 13.0 - 17.0 g/dL   HCT 53.6 14.4 - 31.5 %   MCV 100.7 (H) 80.0 - 100.0 fL   MCH 32.8 26.0 - 34.0 pg   MCHC 32.6 30.0 - 36.0 g/dL   RDW 40.0 86.7 - 61.9 %   Platelets 249 150 - 400 K/uL   nRBC 0.0 0.0 - 0.2 %   Neutrophils Relative % 27 %   Neutro Abs 2.2 1.7 - 7.7 K/uL   Lymphocytes Relative 60 %   Lymphs Abs 4.9 (H) 0.7 - 4.0 K/uL   Monocytes Relative 9 %  Monocytes Absolute 0.8 0.1 - 1.0 K/uL   Eosinophils Relative 2 %   Eosinophils Absolute 0.2 0.0 - 0.5 K/uL   Basophils Relative 1 %   Basophils Absolute 0.1 0.0 - 0.1 K/uL   Immature Granulocytes 1 %   Abs Immature Granulocytes 0.05 0.00 - 0.07 K/uL  Basic metabolic panel  Result Value Ref Range   Sodium 144 135 - 145 mmol/L   Potassium 3.3 (L) 3.5 - 5.1 mmol/L   Chloride 107 98 - 111 mmol/L   CO2 25 22 - 32 mmol/L   Glucose, Bld 127 (H) 70 - 99 mg/dL   BUN 12 6 - 20 mg/dL   Creatinine, Ser 2.70 0.61 - 1.24 mg/dL   Calcium 8.3 (L) 8.9 - 10.3 mg/dL   GFR, Estimated >35 >00 mL/min   Anion gap 12 5 - 15  Hepatic function panel  Result Value Ref Range   Total Protein 7.5 6.5 - 8.1 g/dL   Albumin 4.3 3.5 - 5.0 g/dL   AST 25 15 - 41 U/L   ALT 20 0 - 44 U/L   Alkaline Phosphatase 58 38 - 126 U/L   Total Bilirubin 0.3 0.3 - 1.2 mg/dL   Bilirubin, Direct 0.1 0.0 - 0.2 mg/dL   Indirect Bilirubin 0.2 (L) 0.3 - 0.9 mg/dL  Brain natriuretic peptide  Result Value  Ref Range   B Natriuretic Peptide 16.0 0.0 - 100.0 pg/mL  Lactic acid, plasma  Result Value Ref Range   Lactic Acid, Venous 2.4 (HH) 0.5 - 1.9 mmol/L  Lipase, blood  Result Value Ref Range   Lipase 53 (H) 11 - 51 U/L  Urinalysis, Routine w reflex microscopic Urine, Catheterized  Result Value Ref Range   Color, Urine STRAW (A) YELLOW   APPearance CLEAR CLEAR   Specific Gravity, Urine 1.009 1.005 - 1.030   pH 6.0 5.0 - 8.0   Glucose, UA NEGATIVE NEGATIVE mg/dL   Hgb urine dipstick SMALL (A) NEGATIVE   Bilirubin Urine NEGATIVE NEGATIVE   Ketones, ur NEGATIVE NEGATIVE mg/dL   Protein, ur NEGATIVE NEGATIVE mg/dL   Nitrite NEGATIVE NEGATIVE   Leukocytes,Ua NEGATIVE NEGATIVE   RBC / HPF 0-5 0 - 5 RBC/hpf   WBC, UA 0-5 0 - 5 WBC/hpf   Bacteria, UA RARE (A) NONE SEEN   Mucus PRESENT   Rapid urine drug screen (hospital performed)  Result Value Ref Range   Opiates NONE DETECTED NONE DETECTED   Cocaine NONE DETECTED NONE DETECTED   Benzodiazepines NONE DETECTED NONE DETECTED   Amphetamines NONE DETECTED NONE DETECTED   Tetrahydrocannabinol NONE DETECTED NONE DETECTED   Barbiturates NONE DETECTED NONE DETECTED  Ethanol  Result Value Ref Range   Alcohol, Ethyl (B) 526 (HH) <10 mg/dL  Beta-hydroxybutyric acid  Result Value Ref Range   Beta-Hydroxybutyric Acid 0.07 0.05 - 0.27 mmol/L  Ammonia  Result Value Ref Range   Ammonia 39 (H) 9 - 35 umol/L  Salicylate level  Result Value Ref Range   Salicylate Lvl <4.0 (L) 7.0 - 30.0 mg/dL  Acetaminophen level  Result Value Ref Range   Acetaminophen (Tylenol), Serum <10 (L) 10 - 30 ug/mL  Protime-INR  Result Value Ref Range   Prothrombin Time 11.4 11.4 - 15.2 seconds   INR 0.9 0.8 - 1.2  Blood gas, arterial  Result Value Ref Range   FIO2 40.00    pH, Arterial 7.385 7.350 - 7.450   pCO2 arterial 41.4 32.0 - 48.0 mmHg   pO2, Arterial 101 83.0 -  108.0 mmHg   Bicarbonate 24.1 20.0 - 28.0 mmol/L   Acid-base deficit 0.1 0.0 - 2.0 mmol/L    O2 Saturation 95.9 %   Patient temperature 37.0    Allens test (pass/fail) PASS PASS  HIV Antibody (routine testing w rflx)  Result Value Ref Range   HIV Screen 4th Generation wRfx Non Reactive Non Reactive  Comprehensive metabolic panel  Result Value Ref Range   Sodium 143 135 - 145 mmol/L   Potassium 3.1 (L) 3.5 - 5.1 mmol/L   Chloride 106 98 - 111 mmol/L   CO2 21 (L) 22 - 32 mmol/L   Glucose, Bld 117 (H) 70 - 99 mg/dL   BUN 11 6 - 20 mg/dL   Creatinine, Ser 9.14 0.61 - 1.24 mg/dL   Calcium 7.8 (L) 8.9 - 10.3 mg/dL   Total Protein 6.5 6.5 - 8.1 g/dL   Albumin 3.7 3.5 - 5.0 g/dL   AST 40 15 - 41 U/L   ALT 17 0 - 44 U/L   Alkaline Phosphatase 45 38 - 126 U/L   Total Bilirubin 0.8 0.3 - 1.2 mg/dL   GFR, Estimated >78 >29 mL/min   Anion gap 16 (H) 5 - 15  CBC  Result Value Ref Range   WBC 11.8 (H) 4.0 - 10.5 K/uL   RBC 3.89 (L) 4.22 - 5.81 MIL/uL   Hemoglobin 12.9 (L) 13.0 - 17.0 g/dL   HCT 56.2 (L) 13.0 - 86.5 %   MCV 99.7 80.0 - 100.0 fL   MCH 33.2 26.0 - 34.0 pg   MCHC 33.2 30.0 - 36.0 g/dL   RDW 78.4 69.6 - 29.5 %   Platelets 202 150 - 400 K/uL   nRBC 0.0 0.0 - 0.2 %  Ethanol  Result Value Ref Range   Alcohol, Ethyl (B) 386 (HH) <10 mg/dL  C-reactive protein  Result Value Ref Range   CRP 0.6 <1.0 mg/dL  Procalcitonin - Baseline  Result Value Ref Range   Procalcitonin <0.10 ng/mL  Glucose, capillary  Result Value Ref Range   Glucose-Capillary 103 (H) 70 - 99 mg/dL  Troponin I (High Sensitivity)  Result Value Ref Range   Troponin I (High Sensitivity) <2 <18 ng/L  Troponin I (High Sensitivity)  Result Value Ref Range   Troponin I (High Sensitivity) 2 <18 ng/L   C. Laural Benes, MD [facility]   05/21/2020 11:46 PM How to contact the [facility] Attending or Consulting provider 7A - 7P or covering provider during after hours 7P -7A, for this patient?  1. Check the care team in [facility] and look for a) attending/consulting TR[facility] provider listed and b) the [facility] team  listed 2. Log into [email or web address] and use [place]'s universal password to access. If you do not have the password, please contact the hospital operator. 3. Locate the [facility] provider you are looking for under [facility] and page to a number that you can be directly reached. 4. If you still have difficulty reaching the provider, please page the [facility] (Director on Call) for the Hospitalists listed on amion for assistance.

## 2020-05-22 NOTE — Progress Notes (Signed)
Nurse notified MD that pt's HR is trending down to the 50-60s with BP being the best its been all day at 122/80, temp trending down to 96.1, orders obtained for the blanket warmer, nurse will continue to monitor.

## 2020-05-22 NOTE — ED Notes (Signed)
Patient on vent , aspirated at home. Brown  stomach content found after intubation with suction. Wife states he has aspirated before. Has a HX of alcohol abuse.

## 2020-05-22 NOTE — ED Notes (Signed)
Albuterol / atrovent neb given for wheezes after intubation.

## 2020-05-22 NOTE — ED Notes (Signed)
Date and time results received: 05/22/20 0031   Test: ETOH Critical Value: 526  Name of Provider Notified: Blinda Leatherwood, MD

## 2020-05-22 NOTE — Progress Notes (Signed)
Date and time results received: 05/22/20 1600  (use smartphrase ".now" to insert current time)  Test: Lactic Acid  Critical Value: 4.4  Name of Provider Notified: MD Laural Benes  Orders Received? Or Actions Taken?: MD ordered 2 boluses

## 2020-05-22 NOTE — ED Notes (Signed)
Hospitalist at bedside 

## 2020-05-22 NOTE — ED Provider Notes (Signed)
[facility] EMERGENCY DEPARTMENT Provider Note   CSN: [id number] Arrival date & time: 05/21/20  2346     History Chief Complaint  Patient presents with  . Unresponsive    Christian King is a 50 y.o. male.  Patient brought to the emergency department from home by ambulance.  Patient found unresponsive by his wife.  She reports that he had drank approximately half a bottle of liquor which is not unusual for him.  She heard him vomit and then start to struggle with his breathing.  He had turned white, unresponsive and was struggling when she got to him.  EMS report that the patient has been minimally responsive to painful stimuli, have been assisting his breathing by bag-valve-mask during transport.        Past Medical History:  Diagnosis Date  . Alcohol abuse   . B12 deficiency   . Blood dyscrasia    hemachromatosis  . Bronchitis   . Degenerative joint disease (DJD) of hip   . GERD (gastroesophageal reflux disease)   . Gout   . History of kidney stones   . HLD (hyperlipidemia)   . HTN (hypertension)   . Hypothyroidism   . Ventricular tachycardia (HCC)    in setting of hypokalemia/ hypomagnesemia and ETOH  . Wears glasses     Patient Active Problem List   Diagnosis Date Noted  . DVT (deep venous thrombosis) (HCC) 05/15/2016  . Hypokalemia 03/17/2016  . 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  . QT prolongation 03/20/2015  . BMI 25.0-25.9,adult 01/28/2015  . Insomnia 02/05/2014  . Hemochromatosis 10/03/2013  . Alcohol abuse 07/24/2010  . Hypothyroidism 04/26/2010  . B12 deficiency 04/26/2010  . Hyperlipidemia 04/26/2010  . Gout 04/26/2010  . Essential hypertension 04/26/2010  . h/o VENTRICULAR TACHYCARDIA 04/26/2010    Past Surgical History:  Procedure Laterality Date  . TOTAL HIP ARTHROPLASTY Right   . TOTAL HIP ARTHROPLASTY Left 03/16/2016   Procedure: TOTAL HIP ARTHROPLASTY;  Surgeon: Frederico Hammanaffrey, Daniel, MD;  Location: [facility] OR;  Service:  Orthopedics;  Laterality: Left;  . vascularized fibular graft     right hip for avascular necrosis       Family History  Problem Relation Age of Onset  . Hypertension Father   . Diabetes Maternal Grandmother   . Hypertension Paternal Grandmother   . Stroke Paternal Grandmother     Social History   Tobacco Use  . Smoking status: Current Every Day Smoker    Packs/day: 0.25    Types: Cigarettes    Start date: 05/05/1985  . Smokeless tobacco: Never Used  Substance Use Topics  . Alcohol use: Yes    Comment: daily  . Drug use: No    Home Medications Prior to Admission medications   Medication Sig Start Date End Date Taking? Authorizing Provider  aspirin 325 MG tablet Take 325 mg by mouth daily.    [provider]  fenofibrate 54 MG tablet Take 1 tablet by mouth  daily 02/08/20   Daphine DeutscherMartin, Mary-Margaret, FNP  magnesium oxide (MAG-OX) 400 MG tablet Take 1 tablet (400 mg total) by mouth daily. 02/08/20   Daphine DeutscherMartin, Mary-Margaret, FNP  metoprolol succinate (TOPROL-XL) 50 MG 24 hr tablet TAKE 1 TABLET BY MOUTH ONCE DAILY WITH A MEAL OR IMMEDIATELY AFTER A MEAL, 02/08/20   Daphine DeutscherMartin, Mary-Margaret, FNP  potassium chloride (KLOR-CON M10) 10 MEQ tablet TAKE 1 TABLET BY MOUTH EVERY DAY 02/08/20   Daphine DeutscherMartin, Mary-Margaret, FNP  sildenafil (REVATIO) 20 MG tablet TAKE TWO TO  FIVE TABLETS BY MOUTH AS NEEDED FOR SEX 01/30/19   Daphine Deutscher, Mary-Margaret, FNP  simvastatin (ZOCOR) 40 MG tablet Take 1 tablet (40 mg total) by mouth daily. 02/08/20   Daphine Deutscher Mary-Margaret, FNP    Allergies    Azithromycin, Amoxicillin, Hctz [hydrochlorothiazide], and Tramadol  Review of Systems   Review of Systems  Unable to perform ROS: Mental status change    Physical Exam Updated Vital Signs BP 98/71   Pulse (!) 56   Resp (!) 24   Ht 5\' 7"  (1.702 m)   Wt 91.2 kg   SpO2 100%   BMI 31.49 kg/m   Physical Exam Vitals and nursing note reviewed.  Constitutional:      Appearance: He is well-developed. He is  ill-appearing.  HENT:     Head: Normocephalic and atraumatic.     Comments: Nasal trumpet in right nare    Right Ear: Hearing normal.     Left Ear: Hearing normal.     Nose: Nose normal.  Eyes:     Conjunctiva/sclera: Conjunctivae normal.     Pupils: Pupils are equal, round, and reactive to light.  Cardiovascular:     Rate and Rhythm: Regular rhythm.     Heart sounds: S1 normal and S2 normal. No murmur heard. No friction rub. No gallop.   Pulmonary:     Effort: Pulmonary effort is normal. Bradypnea present. No respiratory distress.     Breath sounds: Decreased air movement present. Decreased breath sounds present.     Comments: Respirations assisted by bag-valve-mask Chest:     Chest wall: No tenderness.  Abdominal:     General: Bowel sounds are normal.     Palpations: Abdomen is soft.     Tenderness: There is no abdominal tenderness. There is no guarding or rebound. Negative signs include Murphy's sign and McBurney's sign.     Hernia: No hernia is present.  Musculoskeletal:        General: Normal range of motion.     Cervical back: Normal range of motion and neck supple.  Skin:    General: Skin is warm and dry.     Findings: No rash.  Neurological:     GCS: GCS eye subscore is 2. GCS verbal subscore is 2. GCS motor subscore is 5.     Cranial Nerves: No cranial nerve deficit.     Sensory: No sensory deficit.     Coordination: Coordination normal.  Psychiatric:        Speech: Speech normal.        Behavior: Behavior normal.        Thought Content: Thought content normal.     ED Results / Procedures / Treatments   Labs (all labs ordered are listed, but only abnormal results are displayed) Labs Reviewed  CBC WITH DIFFERENTIAL/PLATELET - Abnormal; Notable for the following components:      Result Value   MCV 100.7 (*)    Lymphs Abs 4.9 (*)    All other components within normal limits  BASIC METABOLIC PANEL - Abnormal; Notable for the following components:   Potassium  3.3 (*)    Glucose, Bld 127 (*)    Calcium 8.3 (*)    All other components within normal limits  HEPATIC FUNCTION PANEL - Abnormal; Notable for the following components:   Indirect Bilirubin 0.2 (*)    All other components within normal limits  LACTIC ACID, PLASMA - Abnormal; Notable for the following components:   Lactic Acid, Venous 2.4 (*)  All other components within normal limits  LIPASE, BLOOD - Abnormal; Notable for the following components:   Lipase 53 (*)    All other components within normal limits  ETHANOL - Abnormal; Notable for the following components:   Alcohol, Ethyl (B) 526 (*)    All other components within normal limits  AMMONIA - Abnormal; Notable for the following components:   Ammonia 39 (*)    All other components within normal limits  SALICYLATE LEVEL - Abnormal; Notable for the following components:   Salicylate Lvl <4.0 (*)    All other components within normal limits  ACETAMINOPHEN LEVEL - Abnormal; Notable for the following components:   Acetaminophen (Tylenol), Serum <10 (*)    All other components within normal limits  BRAIN NATRIURETIC PEPTIDE  RAPID URINE DRUG SCREEN, HOSP PERFORMED  BETA-HYDROXYBUTYRIC ACID  PROTIME-INR  BLOOD GAS, ARTERIAL  URINALYSIS, ROUTINE W REFLEX MICROSCOPIC  TROPONIN I (HIGH SENSITIVITY)    EKG None  Radiology CT HEAD WO CONTRAST  Result Date: 05/22/2020 CLINICAL DATA:  Unresponsive. Mental status change of unknown cause. Alcohol use. EXAM: CT HEAD WITHOUT CONTRAST TECHNIQUE: Contiguous axial images were obtained from the base of the skull through the vertex without intravenous contrast. COMPARISON:  None. FINDINGS: Brain: No evidence of acute infarction, hemorrhage, hydrocephalus, extra-axial collection or mass lesion/mass effect. Mild diffuse cerebral atrophy. Vascular: No hyperdense vessel or unexpected calcification. Skull: Normal. Negative for fracture or focal lesion. Sinuses/Orbits: No acute finding. Other:  None. IMPRESSION: 1. No acute intracranial abnormalities. 2. Mild diffuse cerebral atrophy. Electronically Signed   By: Burman Nieves M.D.   On: 05/22/2020 01:14   DG Chest Portable 1 View  Result Date: 05/22/2020 CLINICAL DATA:  Post intubation EXAM: PORTABLE CHEST 1 VIEW COMPARISON:  05/15/2016 FINDINGS: Endotracheal tube 3.7 cm above the carina. NG tube is in the stomach. Heart is normal size. Lungs clear. No effusions or acute bony abnormality. IMPRESSION: No acute cardiopulmonary disease. Electronically Signed   By: Charlett Nose M.D.   On: 05/22/2020 00:25    Procedures Procedure Name: Intubation Date/Time: 05/22/2020 12:11 AM Performed by: Gilda Crease, MD Pre-anesthesia Checklist: Patient identified, Patient being monitored, Emergency Drugs available, Timeout performed and Suction available Oxygen Delivery Method: Non-rebreather mask Preoxygenation: Pre-oxygenation with 100% oxygen Induction Type: Rapid sequence Ventilation: Mask ventilation without difficulty Laryngoscope Size: Glidescope and 3 Grade View: Grade I Tube size: 7.5 mm Number of attempts: 1 Placement Confirmation: ETT inserted through vocal cords under direct vision,  CO2 detector and Breath sounds checked- equal and bilateral Secured at: 25 cm Tube secured with: ETT holder Dental Injury: Teeth and Oropharynx as per pre-operative assessment         Medications Ordered in ED Medications  propofol (DIPRIVAN) 1000 MG/100ML infusion (0 mcg/kg/min  91.2 kg Intravenous Stopped 05/22/20 0048)  fentaNYL in NS (34mcg/ml) infusion-PREMIX (25 mcg/hr Intravenous New Bag/Given 05/22/20 0030)  LORazepam (ATIVAN) injection 0-4 mg (0 mg Intravenous Not Given 05/22/20 0049)    Or  LORazepam (ATIVAN) tablet 0-4 mg ( Oral See Alternative 05/22/20 0049)  LORazepam (ATIVAN) injection 0-4 mg (has no administration in time range)    Or  LORazepam (ATIVAN) tablet 0-4 mg (has no administration in time range)   thiamine tablet 100 mg (has no administration in time range)    Or  thiamine (B-1) injection 100 mg (has no administration in time range)  etomidate (AMIDATE) injection 20 mg (20 mg Intravenous Given 05/21/20 2354)  succinylcholine (ANECTINE) injection 150 mg (  150 mg Intravenous Given 05/21/20 2355)  fentaNYL (SUBLIMAZE) injection 200 mcg (200 mcg Intravenous Given 05/22/20 0028)  sodium chloride 0.9 % bolus 1,000 mL (1,000 mLs Intravenous New Bag/Given 05/22/20 0043)  ipratropium (ATROVENT) 0.02 % nebulizer solution (0.5 mg  Given 05/22/20 0051)  albuterol (PROVENTIL) (2.5 MG/3ML) 0.083% nebulizer solution (5 mg  Given 05/22/20 0051)    ED Course  I have reviewed the triage vital signs and the nursing notes.  Pertinent labs & imaging results that were available during my care of the patient were reviewed by me and considered in my medical decision making (see chart for details).    MDM Rules/Calculators/A&P                          Patient presents to the emergency department for evaluation of altered mental status.  Patient had known to be drinking alcohol tonight.  Wife reports that he is an alcoholic and does drink heavily.  She heard him vomit and then seem to be having some difficulty breathing.  At arrival he was obtunded.  He did open his eyes and grunt to sternal rub, but did not have very good respiratory effort and was not protecting his airway, therefore was intubated.  His work-up has been unremarkable other than significant alcohol intoxication.  Will admit to ICU for monitoring.  CRITICAL CARE Performed by: Gilda Crease   Total critical care time: 35 minutes  Critical care time was exclusive of separately billable procedures and treating other patients.  Critical care was necessary to treat or prevent imminent or life-threatening deterioration.  Critical care was time spent personally by me on the following activities: development of treatment plan with patient and/or  surrogate as well as nursing, discussions with consultants, evaluation of patient's response to treatment, examination of patient, obtaining history from patient or surrogate, ordering and performing treatments and interventions, ordering and review of laboratory studies, ordering and review of radiographic studies, pulse oximetry and re-evaluation of patient's condition.  Final Clinical Impression(s) / ED Diagnoses Final diagnoses:  Acute respiratory failure, unspecified whether with hypoxia or hypercapnia (HCC)  Alcohol intoxication with delirium [facility])    Rx / DC Orders ED Discharge Orders    None       Valery Amedee, Canary Brim, MD 05/22/20 0121

## 2020-05-23 ENCOUNTER — Inpatient Hospital Stay (HOSPITAL_COMMUNITY): Payer: BC Managed Care – PPO

## 2020-05-23 DIAGNOSIS — J9602 Acute respiratory failure with hypercapnia: Secondary | ICD-10-CM | POA: Diagnosis not present

## 2020-05-23 DIAGNOSIS — E538 Deficiency of other specified B group vitamins: Secondary | ICD-10-CM

## 2020-05-23 DIAGNOSIS — J9601 Acute respiratory failure with hypoxia: Secondary | ICD-10-CM | POA: Diagnosis not present

## 2020-05-23 DIAGNOSIS — F10921 Alcohol use, unspecified with intoxication delirium: Secondary | ICD-10-CM

## 2020-05-23 LAB — COMPREHENSIVE METABOLIC PANEL
ALT: 23 U/L (ref 0–44)
AST: 78 U/L — ABNORMAL HIGH (ref 15–41)
Albumin: 3.4 g/dL — ABNORMAL LOW (ref 3.5–5.0)
Alkaline Phosphatase: 54 U/L (ref 38–126)
Anion gap: 10 (ref 5–15)
BUN: 7 mg/dL (ref 6–20)
CO2: 22 mmol/L (ref 22–32)
Calcium: 7.7 mg/dL — ABNORMAL LOW (ref 8.9–10.3)
Chloride: 110 mmol/L (ref 98–111)
Creatinine, Ser: 0.84 mg/dL (ref 0.61–1.24)
GFR, Estimated: >60 mL/min (ref >60)
Glucose, Bld: 124 mg/dL — ABNORMAL HIGH (ref 70–99)
Potassium: 3 mmol/L — ABNORMAL LOW (ref 3.5–5.1)
Sodium: 142 mmol/L (ref 135–145)
Total Bilirubin: 0.9 mg/dL (ref 0.3–1.2)
Total Protein: 6.2 g/dL — ABNORMAL LOW (ref 6.5–8.1)

## 2020-05-23 LAB — ETHANOL: Alcohol, Ethyl (B): <10 mg/dL (ref <10)

## 2020-05-23 LAB — CBC WITH DIFFERENTIAL/PLATELET
Abs Immature Granulocytes: 0.04 10*3/uL (ref 0.00–0.07)
Basophils Absolute: 0 10*3/uL (ref 0.0–0.1)
Basophils Relative: 0 %
Eosinophils Absolute: 0 10*3/uL (ref 0.0–0.5)
Eosinophils Relative: 0 %
HCT: 37.9 % — ABNORMAL LOW (ref 39.0–52.0)
Hemoglobin: 12.6 g/dL — ABNORMAL LOW (ref 13.0–17.0)
Immature Granulocytes: 0 %
Lymphocytes Relative: 7 %
Lymphs Abs: 0.7 10*3/uL (ref 0.7–4.0)
MCH: 33.2 pg (ref 26.0–34.0)
MCHC: 33.2 g/dL (ref 30.0–36.0)
MCV: 100 fL (ref 80.0–100.0)
Monocytes Absolute: 0.8 10*3/uL (ref 0.1–1.0)
Monocytes Relative: 8 %
Neutro Abs: 8.6 10*3/uL — ABNORMAL HIGH (ref 1.7–7.7)
Neutrophils Relative %: 85 %
Platelets: 193 10*3/uL (ref 150–400)
RBC: 3.79 MIL/uL — ABNORMAL LOW (ref 4.22–5.81)
RDW: 12.8 % (ref 11.5–15.5)
WBC: 10.2 10*3/uL (ref 4.0–10.5)
nRBC: 0 % (ref 0.0–0.2)

## 2020-05-23 LAB — RETICULOCYTES
Immature Retic Fract: 11.3 % (ref 2.3–15.9)
RBC.: 3.72 MIL/uL — ABNORMAL LOW (ref 4.22–5.81)
Retic Count, Absolute: 48 10*3/uL (ref 19.0–186.0)
Retic Ct Pct: 1.3 % (ref 0.4–3.1)

## 2020-05-23 LAB — IRON AND TIBC
Iron: 17 ug/dL — ABNORMAL LOW (ref 45–182)
Saturation Ratios: 5 % — ABNORMAL LOW (ref 17.9–39.5)
TIBC: 349 ug/dL (ref 250–450)
UIBC: 332 ug/dL

## 2020-05-23 LAB — GLUCOSE, CAPILLARY
Glucose-Capillary: 126 mg/dL — ABNORMAL HIGH (ref 70–99)
Glucose-Capillary: 141 mg/dL — ABNORMAL HIGH (ref 70–99)
Glucose-Capillary: 64 mg/dL — ABNORMAL LOW (ref 70–99)

## 2020-05-23 LAB — VITAMIN B12: Vitamin B-12: 69 pg/mL — ABNORMAL LOW (ref 180–914)

## 2020-05-23 LAB — FERRITIN: Ferritin: 293 ng/mL (ref 24–336)

## 2020-05-23 LAB — MAGNESIUM: Magnesium: 1.9 mg/dL (ref 1.7–2.4)

## 2020-05-23 LAB — FOLATE: Folate: 4.6 ng/mL — ABNORMAL LOW (ref >5.9)

## 2020-05-23 LAB — PROCALCITONIN: Procalcitonin: <0.1 ng/mL

## 2020-05-23 LAB — LACTIC ACID, PLASMA
Lactic Acid, Venous: 3.1 mmol/L (ref 0.5–1.9)
Lactic Acid, Venous: 2.8 mmol/L (ref 0.5–1.9)
Lactic Acid, Venous: 2.4 mmol/L (ref 0.5–1.9)
Lactic Acid, Venous: 2.2 mmol/L (ref 0.5–1.9)

## 2020-05-23 MED ORDER — DEXTROSE 50 % IV SOLN
1.0000 | Freq: Once | INTRAVENOUS | Status: AC
  Administered 2020-05-23: 50 mL via INTRAVENOUS
  Filled 2020-05-23: qty 50

## 2020-05-23 MED ORDER — CYANOCOBALAMIN 1000 MCG/ML IJ SOLN
1000.0000 ug | Freq: Once | INTRAMUSCULAR | Status: AC
  Administered 2020-05-23: 1000 ug via INTRAMUSCULAR
  Filled 2020-05-23: qty 1

## 2020-05-23 MED ORDER — PANTOPRAZOLE SODIUM 40 MG PO TBEC
40.0000 mg | DELAYED_RELEASE_TABLET | Freq: Every day | ORAL | Status: DC
  Administered 2020-05-23 – 2020-05-25 (×3): 40 mg via ORAL
  Filled 2020-05-23 (×3): qty 1

## 2020-05-23 MED ORDER — POTASSIUM CHLORIDE 10 MEQ/100ML IV SOLN
10.0000 meq | INTRAVENOUS | Status: AC
  Administered 2020-05-23 (×5): 10 meq via INTRAVENOUS
  Filled 2020-05-23 (×6): qty 100

## 2020-05-23 MED ORDER — MAGNESIUM SULFATE 2 GM/50ML IV SOLN
2.0000 g | Freq: Once | INTRAVENOUS | Status: AC
  Administered 2020-05-23: 2 g via INTRAVENOUS
  Filled 2020-05-23: qty 50

## 2020-05-23 MED ORDER — PHENOL 1.4 % MT LIQD: 1.0000 | OROMUCOSAL | Status: DC | PRN

## 2020-05-23 MED ORDER — CHLORHEXIDINE GLUCONATE 0.12 % MT SOLN
15.0000 mL | Freq: Two times a day (BID) | OROMUCOSAL | Status: DC
  Administered 2020-05-23: 15 mL via OROMUCOSAL
  Filled 2020-05-23: qty 15

## 2020-05-23 MED ORDER — METOPROLOL SUCCINATE ER 25 MG PO TB24
25.0000 mg | ORAL_TABLET | Freq: Every day | ORAL | Status: DC
  Administered 2020-05-23 – 2020-05-25 (×3): 25 mg via ORAL
  Filled 2020-05-23 (×3): qty 1

## 2020-05-23 MED ORDER — VITAMIN B-12 1000 MCG PO TABS
5000.0000 ug | ORAL_TABLET | Freq: Every day | ORAL | Status: DC
  Administered 2020-05-24 – 2020-05-25 (×2): 5000 ug via ORAL
  Filled 2020-05-23 (×2): qty 5

## 2020-05-23 MED ORDER — SODIUM CHLORIDE 0.9 % IV BOLUS: 1000.0000 mL | Freq: Once | INTRAVENOUS | Status: DC

## 2020-05-23 MED ORDER — ORAL CARE MOUTH RINSE
15.0000 mL | Freq: Two times a day (BID) | OROMUCOSAL | Status: DC
  Administered 2020-05-23 (×2): 15 mL via OROMUCOSAL

## 2020-05-23 MED ORDER — SODIUM CHLORIDE 0.9 % IV BOLUS
1000.0000 mL | Freq: Once | INTRAVENOUS | Status: AC
  Administered 2020-05-23: 1000 mL via INTRAVENOUS

## 2020-05-23 NOTE — Consult Note (Signed)
NAME:  Christian King, MRN:  [id number], DOB:  [birth year], LOS: 1 ADMISSION DATE:  05/21/2020, CONSULTATION DATE:  4/4 REFERRING MD:  Laural Benes, CHIEF COMPLAINT:  Vent dep p etoh overdose   History of Present Illness:  46 yowm smoker admitted with etoh intoxication ? Asp >>  PCCM  Asked to see pt 4/4  for ? extubation  Pertinent  Medical History   50 y.o. male, with history of hypothyroidism, hypertension, hyperlipidemia, GERD, B12 deficiency, and alcohol abuse presents to the ED with a chief complaint of difficulty breathing.  Upon arrival patient was not protecting his airway and had been bag-valve-mask assisted breathing with EMS in route.  He was intubated upon arrival and therefore is not able to provide any history.  Wife had reported that he drinks heavily (binges) and had been drinking today.  She reports that he hadn't drank today as much as he normally does.  She heard him vomit and then it sounded like he was having difficulty breathing so she called EMS.  ED provider called critical care at [facility] to inquire about starting antibiotics given possible aspiration, but critical care has advised against it unless he starts showing signs of infection.  Wife does report that patient has been complaining about right lower extremity pain for 3 weeks and does have a history of DVT.   Significant Hospital Events: Including procedures, antibiotic start and stop dates in addition to other pertinent events   . Et 4/3 > extubated 4/4  Interim History / Subjective:  Alert, nodding approp and able to lift head off bed   Objective   Blood pressure (!) 167/98, pulse (!) 52, temperature 98.2 F (36.8 C), temperature source Rectal, resp. rate (!) 24, height 5\' 7"  (1.702 m), weight 91.2 kg, SpO2 97 %.    Vent Mode: PRVC FiO2 (%):  [30 %-35 %] 30 % Set Rate:  [24 bmp] 24 bmp Vt Set:  [520 mL] 520 mL PEEP:  [5 cmH20] 5 cmH20 Plateau Pressure:  [14 cmH20-17 cmH20] 14 cmH20   Intake/Output  Summary (Last 24 hours) at 05/23/2020 0830 Last data filed at 05/23/2020 07/23/2020 Gross per 24 hour  Intake 4490.36 ml  Output 3400 ml  Net 1090.36 ml   Filed Weights   05/21/20 2348 05/22/20 0205  Weight: 91.2 kg 91.2 kg    Examination: General: alert/ nad on vent  HENT: oral et Lungs: clear bilaterally  Cardiovascular: RRR no s3 Abdomen: abd mildly  Distended OG minimal output Extremities: warm s c c or e Neuro: f/c, moves all 4    I personally reviewed images and agree with radiology impression as follows:  CXR:   Portable  4/4 1. Stable and satisfactory support apparatus. 2. Mild patchy airspace opacity in the medial right lower lobe with linear opacity in the medial left lower lobe. Imaging findings are consistent with the reported clinical history of aspiration.        Assessment & Plan:  1) acute resp failure due to severe etoh intoxication with minimal lung injury from presume aspiration  look like now should have good cough mechanics/ sats on NP so ok to extubate this pm   Extubation orders given  - PCCM can f/u prn at your request     Labs   CBC: Recent Labs  Lab 05/21/20 2352 05/22/20 0526 05/23/20 0302  WBC 8.1 11.8* 10.2  NEUTROABS 2.2  --  8.6*  HGB 14.5 12.9* 12.6*  HCT 44.5 38.8* 37.9*  MCV  100.7* 99.7 100.0  PLT 249 202 193    Basic Metabolic Panel: Recent Labs  Lab 05/21/20 2352 05/22/20 0526 05/22/20 1533 05/23/20 0302  NA 144 143  --  142  K 3.3* 3.1*  --  3.0*  CL 107 106  --  110  CO2 25 21*  --  22  GLUCOSE 127* 117*  --  124*  BUN 12 11  --  7  CREATININE 0.86 0.93  --  0.84  CALCIUM 8.3* 7.8*  --  7.7*  MG  --   --  1.4* 1.9   GFR: Estimated Creatinine Clearance: 110.7 mL/min (by C-G formula based on SCr of 0.84 mg/dL). Recent Labs  Lab 05/21/20 2352 05/22/20 0526 05/22/20 0759 05/22/20 1526 05/22/20 2004 05/22/20 2326 05/23/20 0302 05/23/20 0752  PROCALCITON  --   --  <0.10  --   --   --  <0.10  --   WBC 8.1 11.8*   --   --   --   --  10.2  --   LATICACIDVEN 2.4*  --   --    < > 4.1* 3.1* 2.8* 2.4*   < > = values in this interval not displayed.    Liver Function Tests: Recent Labs  Lab 05/21/20 2352 05/22/20 0526 05/23/20 0302  AST 25 40 78*  ALT 20 17 23   ALKPHOS 58 45 54  BILITOT 0.3 0.8 0.9  PROT 7.5 6.5 6.2*  ALBUMIN 4.3 3.7 3.4*   Recent Labs  Lab 05/21/20 2352  LIPASE 53*   Recent Labs  Lab 05/21/20 2352  AMMONIA 39*    ABG    Component Value Date/Time   PHART 7.385 05/22/2020 0041   PCO2ART 41.4 05/22/2020 0041   PO2ART 101 05/22/2020 0041   HCO3 24.1 05/22/2020 0041   TCO2 19 04/11/2010 1754   ACIDBASEDEF 0.1 05/22/2020 0041   O2SAT 95.9 05/22/2020 0041     Coagulation Profile: Recent Labs  Lab 05/21/20 2352  INR 0.9    Cardiac Enzymes: No results for input(s): CKTOTAL, CKMB, CKMBINDEX, TROPONINI in the last 168 hours.  HbA1C: No results found for: HGBA1C  CBG: Recent Labs  Lab 05/22/20 1137 05/22/20 1725 05/23/20 0023 05/23/20 0212 05/23/20 0625  GLUCAP 103* 84 64* 126* 141*       Past Medical History:  He,  has a past medical history of Alcohol abuse, B12 deficiency, Blood dyscrasia, Bronchitis, Degenerative joint disease (DJD) of hip, GERD (gastroesophageal reflux disease), Gout, History of kidney stones, HLD (hyperlipidemia), HTN (hypertension), Hypothyroidism, Ventricular tachycardia (HCC), and Wears glasses.   Surgical History:   Past Surgical History:  Procedure Laterality Date  . TOTAL HIP ARTHROPLASTY Right   . TOTAL HIP ARTHROPLASTY Left 03/16/2016   Procedure: TOTAL HIP ARTHROPLASTY;  Surgeon: 03/18/2016, MD;  Location: [facility] OR;  Service: Orthopedics;  Laterality: Left;  . vascularized fibular graft     right hip for avascular necrosis     Social History:   reports that he has been smoking cigarettes. He started smoking about 35 years ago. He has been smoking about 0.25 packs per day. He has never used smokeless tobacco. He  reports current alcohol use. He reports that he does not use drugs.   Family History:  His family history includes Diabetes in his maternal grandmother; Hypertension in his father and paternal grandmother; Stroke in his paternal grandmother.   Allergies Allergies  Allergen Reactions  . Azithromycin Other (See Comments)    History of QT  prolongation  . Amoxicillin Hives and Itching  . Hctz [Hydrochlorothiazide] Rash  . Tramadol Itching     Home Medications  Prior to Admission medications   Medication Sig Start Date End Date Taking? Authorizing Provider  fenofibrate 54 MG tablet Take 1 tablet by mouth  daily 02/08/20  Yes Martin, Mary-Margaret, FNP  magnesium oxide (MAG-OX) 400 MG tablet Take 1 tablet (400 mg total) by mouth daily. 02/08/20  Yes Daphine Deutscher, Mary-Margaret, FNP  metoprolol succinate (TOPROL-XL) 50 MG 24 hr tablet TAKE 1 TABLET BY MOUTH ONCE DAILY WITH A MEAL OR IMMEDIATELY AFTER A MEAL, 02/08/20  Yes Daphine Deutscher, Mary-Margaret, FNP  potassium chloride (KLOR-CON M10) 10 MEQ tablet TAKE 1 TABLET BY MOUTH EVERY DAY 02/08/20  Yes Daphine Deutscher, Mary-Margaret, FNP  simvastatin (ZOCOR) 40 MG tablet Take 1 tablet (40 mg total) by mouth daily. 02/08/20  Yes Martin, Mary-Margaret, FNP  sildenafil (REVATIO) 20 MG tablet TAKE TWO TO FIVE TABLETS BY MOUTH AS NEEDED FOR SEX 01/30/19   Bennie Pierini, FNP      The patient is critically ill with multiple organ systems failure and requires high complexity decision making for assessment and support, frequent evaluation and titration of therapies, application of advanced monitoring technologies and extensive interpretation of multiple databases. Critical Care Time devoted to patient care services described in this note is 32 minutes.   Sandrea Hughs, MD Pulmonary and Critical Care Medicine [facility] Cell [phone]   After 7:00 pm call Elink  [phone]

## 2020-05-23 NOTE — Progress Notes (Signed)
Patient extubated at [phone] with [name], RT. Successful extubation, pt placed on 3 liters oxygen, O2 saturation 96%. Patient alert and oriented, no complaints of pain. Patient's wife at bedside.

## 2020-05-23 NOTE — Procedures (Signed)
Extubation Procedure Note  Patient Details:   Name: Christian King DOB: [birth year] MRN: [id number]   Airway Documentation:    Vent end date: 05/23/20 Vent end time: 0851   Evaluation  O2 sats: stable throughout Complications: No apparent complications Patient did tolerate procedure well. Bilateral Breath Sounds: Clear,Diminished   Yes  Patient extubated without any complications and placed on 3LNC. All vitals are stable. Patient able to speak clearly and has an adequate cough. Will monitor as needed.  Sharene Skeans 05/23/2020, 8:57 AM

## 2020-05-23 NOTE — Progress Notes (Signed)
PROGRESS NOTE   Christian King  ZOX:[id number] DOB: Jul 08, 1970 DOA: 05/21/2020 PCP: Bennie Pierini, FNP   Chief Complaint  Patient presents with  . Unresponsive   Level of care: ICU  Brief Admission History:  50 y.o. male, with history of hypothyroidism, hypertension, hyperlipidemia, GERD, B12 deficiency, and alcohol abuse presents to the ED with a chief complaint of difficulty breathing.  Upon arrival patient was not protecting his airway and had been bag-valve-mask assisted breathing with EMS in route.  He was intubated upon arrival and therefore is not able to provide any history.  Wife had reported that he drinks heavily (binges) and had been drinking today.  She reports that he hadn't drank today as much as he normally does.  She heard him vomit and then it sounded like he was having difficulty breathing so she called EMS.  ED provider called critical care at [facility] to inquire about starting antibiotics given possible aspiration, but critical care has advised against it unless he starts showing signs of infection.  Wife does report that patient has been complaining about right lower extremity pain for 3 weeks and does have a history of DVT.   Assessment & Plan:   Active Problems:   Acute respiratory failure (HCC)   Alcohol intoxication (HCC)   Pain of right lower extremity  Acute alcohol poisoning  -  Pt presented with extremely high serum alcohol level and because of inability to protect airway was intubated and ventilated for 24 hours on a Precedex infusion with fentanyl infusion.  He was treated supportively with aggressive IV fluid hydration  Severe dehydration -presenting with markedly elevated lactic acid levels and this was treated with aggressive IV fluid boluses and hydration and lactic acid was followed and trended.   Acute metabolic encephalopathy -treating supportively at baseline  Chronic alcoholism with binge drinking disorder - He remains on CIWA protocol.    Acute respiratory failure - Pt was successfully extubated by Dr. Sherene Sires pulmonologist.   Hypokalemia - IV replacement ordered.  Recheck in AM.   Hypomagnesemia - IV replacement ordered.    Hemochromatosis - follow up iron level. Follow up with hematologist outpatient.   Essential hypertension - stable, follow.   Hypothyroidism - reported by history , not taking thyroid hormone replacement. No checking TSH in setting of critical illness.   Right Leg  Pain - US doppler negative for DVT.    Severe Vitamin B12 deficiency - Start weekly B12 injections and oral replacement.    DVT prophylaxis: enoxaparin Code Status: full  Family Communication: wife at bedside Disposition: Home  Status is: Inpatient  Remains inpatient appropriate because:IV treatments appropriate due to intensity of illness or inability to take PO and Inpatient level of care appropriate due to severity of illness  Dispo: The patient is from: Home              Anticipated d/c is to: Home              Patient currently is not medically stable to d/c.   Difficult to place patient No   Consultants:   pulmonary  Procedures:     Antimicrobials:     Subjective: No specific complaint at this time  Objective: Vitals:   05/23/20 1600 05/23/20 1635 05/23/20 1700 05/23/20 1800  BP: 113/60  (!) 108/59 139/68  Pulse: 84 97 84 (!) 102  Resp:    18  Temp:  99.1 F (37.3 C)    TempSrc:  Oral  SpO2: 96% 95% 95% 95%  Weight:      Height:        Intake/Output Summary (Last 24 hours) at 05/23/2020 1843 Last data filed at 05/23/2020 1756 Gross per 24 hour  Intake 4510.2 ml  Output 2525 ml  Net 1985.2 ml   Filed Weights   05/21/20 2348 05/22/20 0205  Weight: 91.2 kg 91.2 kg   Examination:  General exam: Appears calm and comfortable  Respiratory system: Clear to auscultation. Respiratory effort normal. Cardiovascular system: normal S1 & S2 heard. No JVD, murmurs, rubs, gallops or clicks. No pedal  edema. Gastrointestinal system: Abdomen is nondistended, soft and nontender. No organomegaly or masses felt. Normal bowel sounds heard. Central nervous system: Alert and oriented. No focal neurological deficits. Extremities: Symmetric 5 x 5 power. Skin: No rashes, lesions or ulcers Psychiatry: Judgement and insight appear poor. Mood & affect flat.   Data Reviewed: I have personally reviewed following labs and imaging studies  CBC: Recent Labs  Lab 05/21/20 2352 05/22/20 0526 05/23/20 0302  WBC 8.1 11.8* 10.2  NEUTROABS 2.2  --  8.6*  HGB 14.5 12.9* 12.6*  HCT 44.5 38.8* 37.9*  MCV 100.7* 99.7 100.0  PLT 249 202 193    Basic Metabolic Panel: Recent Labs  Lab 05/21/20 2352 05/22/20 0526 05/22/20 1533 05/23/20 0302  NA 144 143  --  142  K 3.3* 3.1*  --  3.0*  CL 107 106  --  110  CO2 25 21*  --  22  GLUCOSE 127* 117*  --  124*  BUN 12 11  --  7  CREATININE 0.86 0.93  --  0.84  CALCIUM 8.3* 7.8*  --  7.7*  MG  --   --  1.4* 1.9    GFR: Estimated Creatinine Clearance: 110.7 mL/min (by C-G formula based on SCr of 0.84 mg/dL).  Liver Function Tests: Recent Labs  Lab 05/21/20 2352 05/22/20 0526 05/23/20 0302  AST 25 40 78*  ALT 20 17 23   ALKPHOS 58 45 54  BILITOT 0.3 0.8 0.9  PROT 7.5 6.5 6.2*  ALBUMIN 4.3 3.7 3.4*    CBG: Recent Labs  Lab 05/22/20 1137 05/22/20 1725 05/23/20 0023 05/23/20 0212 05/23/20 0625  GLUCAP 103* 84 64* 126* 141*    Recent Results (from the past 240 hour(s))  Resp Panel by RT-PCR (Flu A&B, Covid) Nasopharyngeal Swab     Status: None   Collection Time: 05/22/20  1:30 AM   Specimen: Nasopharyngeal Swab; Nasopharyngeal(NP) swabs in vial transport medium  Result Value Ref Range Status   SARS Coronavirus 2 by RT PCR NEGATIVE NEGATIVE Final    Comment: (NOTE) SARS-CoV-2 target nucleic acids are NOT DETECTED.  The SARS-CoV-2 RNA is generally detectable in upper respiratory specimens during the acute phase of infection. The  lowest concentration of SARS-CoV-2 viral copies this assay can detect is 138 copies/mL. A negative result does not preclude SARS-Cov-2 infection and should not be used as the sole basis for treatment or other patient management decisions. A negative result may occur with  improper specimen collection/handling, submission of specimen other than nasopharyngeal swab, presence of viral mutation(s) within the areas targeted by this assay, and inadequate number of viral copies(<138 copies/mL). A negative result must be combined with clinical observations, patient history, and epidemiological information. The expected result is Negative.  Fact Sheet for Patients:  07/22/20  Fact Sheet for Healthcare Providers:  BloggerCourse.com  This test is no t yet approved or cleared by the [employer]  States FDA and  has been authorized for detection and/or diagnosis of SARS-CoV-2 by FDA under an Emergency Use Authorization (EUA). This EUA will remain  in effect (meaning this test can be used) for the duration of the COVID-19 declaration under Section 564(b)(1) of the Act, 21 U.S.C.section 360bbb-3(b)(1), unless the authorization is terminated  or revoked sooner.       Influenza A by PCR NEGATIVE NEGATIVE Final   Influenza B by PCR NEGATIVE NEGATIVE Final    Comment: (NOTE) The Xpert Xpress SARS-CoV-2/FLU/RSV plus assay is intended as an aid in the diagnosis of influenza from Nasopharyngeal swab specimens and should not be used as a sole basis for treatment. Nasal washings and aspirates are unacceptable for Xpert Xpress SARS-CoV-2/FLU/RSV testing.  Fact Sheet for Patients: BloggerCourse.com  Fact Sheet for Healthcare Providers: SeriousBroker.it  This test is not yet approved or cleared by the [place] FDA and has been authorized for detection and/or diagnosis of SARS-CoV-2 by FDA under  an Emergency Use Authorization (EUA). This EUA will remain in effect (meaning this test can be used) for the duration of the COVID-19 declaration under Section 564(b)(1) of the Act, 21 U.S.C. section 360bbb-3(b)(1), unless the authorization is terminated or revoked.  Performed at [facility], [address], [place], [place] [address]   MRSA PCR Screening     Status: None   Collection Time: 05/22/20  5:08 AM   Specimen: Nasal Mucosa; Nasopharyngeal  Result Value Ref Range Status   MRSA by PCR NEGATIVE NEGATIVE Final    Comment:        The GeneXpert MRSA Assay (FDA approved for NASAL specimens only), is one component of a comprehensive MRSA colonization surveillance program. It is not intended to diagnose MRSA infection nor to guide or monitor treatment for MRSA infections. Performed at [facility], [address], [place], [place] [address]   Culture, blood (Routine X 2) w Reflex to ID Panel     Status: None (Preliminary result)   Collection Time: 05/22/20  7:59 AM   Specimen: BLOOD RIGHT ARM  Result Value Ref Range Status   Specimen Description   Final    BLOOD RIGHT ARM BOTTLES DRAWN AEROBIC AND ANAEROBIC   Special Requests   Final    Blood Culture results may not be optimal due to an excessive volume of blood received in culture bottles   Culture   Final    NO GROWTH 1 DAY Performed at [facility], [address], [place], [place] [address]    Report Status PENDING  Incomplete  Culture, blood (Routine X 2) w Reflex to ID Panel     Status: None (Preliminary result)   Collection Time: 05/22/20  7:59 AM   Specimen: BLOOD RIGHT HAND  Result Value Ref Range Status   Specimen Description   Final    BLOOD RIGHT HAND BOTTLES DRAWN AEROBIC AND ANAEROBIC   Special Requests   Final    Blood Culture results may not be optimal due to an excessive volume of blood received in culture bottles   Culture   Final    NO GROWTH 1 DAY Performed at [facility], [address],  [place], [place] [address]    Report Status PENDING  Incomplete     Radiology Studies: CT HEAD WO CONTRAST  Result Date: 05/22/2020 CLINICAL DATA:  Unresponsive. Mental status change of unknown cause. Alcohol use. EXAM: CT HEAD WITHOUT CONTRAST TECHNIQUE: Contiguous axial images were obtained from the base of the skull through  the vertex without intravenous contrast. COMPARISON:  None. FINDINGS: Brain: No evidence of acute infarction, hemorrhage, hydrocephalus, extra-axial collection or mass lesion/mass effect. Mild diffuse cerebral atrophy. Vascular: No hyperdense vessel or unexpected calcification. Skull: Normal. Negative for fracture or focal lesion. Sinuses/Orbits: No acute finding. Other: None. IMPRESSION: 1. No acute intracranial abnormalities. 2. Mild diffuse cerebral atrophy. Electronically Signed   By: Burman Nieves M.D.   On: 05/22/2020 01:14   US Venous Img Lower Unilateral Right (DVT)  Result Date: 05/22/2020 CLINICAL DATA:  Right lower extremity pain. EXAM: RIGHT LOWER EXTREMITY VENOUS DOPPLER ULTRASOUND TECHNIQUE: Gray-scale sonography with compression, as well as color and duplex ultrasound, were performed to evaluate the deep venous system(s) from the level of the common femoral vein through the popliteal and proximal calf veins. COMPARISON:  None. FINDINGS: VENOUS Normal compressibility of the common femoral, superficial femoral, and popliteal veins, as well as the visualized calf veins. Visualized portions of profunda femoral vein and great saphenous vein unremarkable. No filling defects to suggest DVT on grayscale or color Doppler imaging. Doppler waveforms show normal direction of venous flow, normal respiratory plasticity and response to augmentation. Limited views of the contralateral common femoral vein are unremarkable. OTHER None. Limitations: none IMPRESSION: Negative. Electronically Signed   By: Elberta Fortis M.D.   On: 05/22/2020 11:40   DG CHEST PORT 1 VIEW  Result Date:  05/23/2020 CLINICAL DATA:  Intubated, aspiration EXAM: PORTABLE CHEST 1 VIEW COMPARISON:  Prior chest x-ray yesterday FINDINGS: Patient remains intubated. The tip of the endotracheal tube is 4.8 cm above the carina. A gastric tube is present. The tip lies off the field of view but below the diaphragm presumably in the stomach. Cardiac and mediastinal contours remain unchanged. The lungs are relatively clear. No pulmonary edema, pleural effusion or pneumothorax. Mild patchy airspace opacity in the right lower lobe and linear airspace opacity in the left retrocardiac region. No acute osseous abnormality. IMPRESSION: 1. Stable and satisfactory support apparatus. 2. Mild patchy airspace opacity in the medial right lower lobe with linear opacity in the medial left lower lobe. Imaging findings are consistent with the reported clinical history of aspiration. Electronically Signed   By: Malachy Moan M.D.   On: 05/23/2020 08:02   DG Chest Portable 1 View  Result Date: 05/22/2020 CLINICAL DATA:  Post intubation EXAM: PORTABLE CHEST 1 VIEW COMPARISON:  05/15/2016 FINDINGS: Endotracheal tube 3.7 cm above the carina. NG tube is in the stomach. Heart is normal size. Lungs clear. No effusions or acute bony abnormality. IMPRESSION: No acute cardiopulmonary disease. Electronically Signed   By: Charlett Nose M.D.   On: 05/22/2020 00:25   Scheduled Meds: . chlorhexidine  15 mL Mouth Rinse BID  . Chlorhexidine Gluconate Cloth  6 each Topical Daily  . heparin  5,000 Units Subcutaneous Q8H  . LORazepam  0-4 mg Intravenous Q6H   Or  . LORazepam  0-4 mg Oral Q6H  . [START ON 05/24/2020] LORazepam  0-4 mg Intravenous Q12H   Or  . [START ON 05/24/2020] LORazepam  0-4 mg Oral Q12H  . mouth rinse  15 mL Mouth Rinse q12n4p  . metoprolol succinate  25 mg Oral Daily  . pantoprazole  40 mg Oral Q0600  . thiamine  100 mg Oral Daily   Or  . thiamine  100 mg Intravenous Daily   Continuous Infusions: . dexmedetomidine (PRECEDEX)  IV infusion Stopped (05/23/20 0949)  . dextrose 5 % and 0.9% NaCl 150 mL/hr at 05/23/20 1513  .  fentaNYL infusion INTRAVENOUS Stopped (05/23/20 0849)  . sodium chloride      LOS: 1 day   Time spent: 36 mins   Stokely Jeancharles Laural BenesJohnson, MD How to contact the [facility] Attending or Consulting provider 7A - 7P or covering provider during after hours 7P -7A, for this patient?  1. Check the care team in [facility] and look for a) attending/consulting TR[facility] provider listed and b) the [facility] team listed 2. Log into [email or web address] and use [place]'s universal password to access. If you do not have the password, please contact the hospital operator. 3. Locate the [facility] provider you are looking for under [facility] and page to a number that you can be directly reached. 4. If you still have difficulty reaching the provider, please page the [facility] (Director on Call) for the Hospitalists listed on amion for assistance.  05/23/2020, 6:43 PM

## 2020-05-23 NOTE — Progress Notes (Signed)
Critical Lactic of 3.1 called at 0035 05/23/20. MD notified. Awaiting response/orders at this time.

## 2020-05-24 ENCOUNTER — Inpatient Hospital Stay (HOSPITAL_COMMUNITY): Payer: BC Managed Care – PPO

## 2020-05-24 ENCOUNTER — Encounter (HOSPITAL_COMMUNITY): Payer: Self-pay | Admitting: Family Medicine

## 2020-05-24 LAB — CBC WITH DIFFERENTIAL/PLATELET
Abs Immature Granulocytes: 0.03 10*3/uL (ref 0.00–0.07)
Basophils Absolute: 0 10*3/uL (ref 0.0–0.1)
Basophils Relative: 0 %
Eosinophils Absolute: 0 10*3/uL (ref 0.0–0.5)
Eosinophils Relative: 1 %
HCT: 32 % — ABNORMAL LOW (ref 39.0–52.0)
Hemoglobin: 10.7 g/dL — ABNORMAL LOW (ref 13.0–17.0)
Immature Granulocytes: 0 %
Lymphocytes Relative: 18 %
Lymphs Abs: 1.2 10*3/uL (ref 0.7–4.0)
MCH: 33.8 pg (ref 26.0–34.0)
MCHC: 33.4 g/dL (ref 30.0–36.0)
MCV: 100.9 fL — ABNORMAL HIGH (ref 80.0–100.0)
Monocytes Absolute: 0.5 10*3/uL (ref 0.1–1.0)
Monocytes Relative: 7 %
Neutro Abs: 5.2 10*3/uL (ref 1.7–7.7)
Neutrophils Relative %: 74 %
Platelets: 151 10*3/uL (ref 150–400)
RBC: 3.17 MIL/uL — ABNORMAL LOW (ref 4.22–5.81)
RDW: 12.7 % (ref 11.5–15.5)
WBC: 7 10*3/uL (ref 4.0–10.5)
nRBC: 0 % (ref 0.0–0.2)

## 2020-05-24 LAB — COMPREHENSIVE METABOLIC PANEL
ALT: 21 U/L (ref 0–44)
AST: 57 U/L — ABNORMAL HIGH (ref 15–41)
Albumin: 3 g/dL — ABNORMAL LOW (ref 3.5–5.0)
Alkaline Phosphatase: 44 U/L (ref 38–126)
Anion gap: 7 (ref 5–15)
BUN: <5 mg/dL — ABNORMAL LOW (ref 6–20)
CO2: 22 mmol/L (ref 22–32)
Calcium: 7.5 mg/dL — ABNORMAL LOW (ref 8.9–10.3)
Chloride: 108 mmol/L (ref 98–111)
Creatinine, Ser: 0.85 mg/dL (ref 0.61–1.24)
GFR, Estimated: >60 mL/min (ref >60)
Glucose, Bld: 136 mg/dL — ABNORMAL HIGH (ref 70–99)
Potassium: 3 mmol/L — ABNORMAL LOW (ref 3.5–5.1)
Sodium: 137 mmol/L (ref 135–145)
Total Bilirubin: 0.8 mg/dL (ref 0.3–1.2)
Total Protein: 5.7 g/dL — ABNORMAL LOW (ref 6.5–8.1)

## 2020-05-24 LAB — PROCALCITONIN: Procalcitonin: <0.1 ng/mL

## 2020-05-24 LAB — MAGNESIUM: Magnesium: 2 mg/dL (ref 1.7–2.4)

## 2020-05-24 MED ORDER — GUAIFENESIN-DM 100-10 MG/5ML PO SYRP
5.0000 mL | ORAL_SOLUTION | ORAL | Status: DC | PRN
  Administered 2020-05-24 – 2020-05-25 (×4): 5 mL via ORAL
  Filled 2020-05-24 (×4): qty 5

## 2020-05-24 MED ORDER — CALCIUM GLUCONATE-NACL 1-0.675 GM/50ML-% IV SOLN
1.0000 g | Freq: Once | INTRAVENOUS | Status: AC
  Administered 2020-05-24: 1000 mg via INTRAVENOUS
  Filled 2020-05-24: qty 50

## 2020-05-24 MED ORDER — POTASSIUM CHLORIDE CRYS ER 20 MEQ PO TBCR
60.0000 meq | EXTENDED_RELEASE_TABLET | Freq: Once | ORAL | Status: AC
  Administered 2020-05-24: 60 meq via ORAL
  Filled 2020-05-24: qty 3

## 2020-05-24 MED ORDER — HYDROCODONE-ACETAMINOPHEN 5-325 MG PO TABS
1.0000 | ORAL_TABLET | Freq: Four times a day (QID) | ORAL | Status: DC | PRN
  Administered 2020-05-24 – 2020-05-25 (×3): 1 via ORAL
  Filled 2020-05-24 (×3): qty 1

## 2020-05-24 NOTE — Evaluation (Signed)
Physical Therapy Evaluation Patient Details Name: Christian King MRN: [id number] DOB: 18-Jan-1971 Today's Date: 05/24/2020   History of Present Illness  Christian King  is a 50 y.o. male, with history of hypothyroidism, hypertension, hyperlipidemia, GERD, B12 deficiency, and alcohol abuse presents to the ED with a chief complaint of difficulty breathing.  Upon arrival patient was not protecting his airway and had been bag-valve-mask assisted breathing with EMS in route.  He was intubated upon arrival and therefore is not able to provide any history.  Wife had reported that he drinks heavily (binges) and had been drinking today.  She reports that he hadn't drank today as much as he normally does.  She heard him vomit and then it sounded like he was having difficulty breathing so she called EMS.  ED provider called critical care at [facility] to inquire about starting antibiotics given possible aspiration, but critical care has advised against it unless he starts showing signs of infection.    Clinical Impression  Patient functioning near baseline for functional mobility and gait, limited mostly due to stomach discomfort due to frequent coughing with poor tolerance for lying flat, demonstrates good return for transfers and ambulating in hallway without loss of balance.  Patient tolerated staying up in chair after therapy with his spouse present in room.  Patient will benefit from continued physical therapy in hospital and recommended venue below to increase strength, balance, endurance for safe ADLs and gait.      Follow Up Recommendations Supervision for mobility/OOB;Supervision - Intermittent;No PT follow up    Equipment Recommendations  None recommended by PT    Recommendations for Other Services       Precautions / Restrictions Precautions Precautions: Fall Restrictions Weight Bearing Restrictions: No      Mobility  Bed Mobility Overal bed mobility: Needs Assistance Bed  Mobility: Supine to Sit     Supine to sit: Supervision;HOB elevated     General bed mobility comments: poor tolerance for lying flat due to stomach discomfort    Transfers Overall transfer level: Needs assistance Equipment used: None;1 person hand held assist Transfers: Stand Pivot Transfers;Squat Pivot Transfers   Stand pivot transfers: Supervision Squat pivot transfers: Supervision     General transfer comment: increased time, labored movement  Ambulation/Gait Ambulation/Gait assistance: Supervision Gait Distance (Feet): 150 Feet Assistive device: None;1 person hand held assist Gait Pattern/deviations: Decreased step length - right;Decreased step length - left;Decreased stride length Gait velocity: decreased   General Gait Details: slightly labored cadence without loss of balance, limited secondary to fatigue abdominal/stomach discomfort  Stairs            Wheelchair Mobility    Modified Rankin (Stroke Patients Only)       Balance Overall balance assessment: Mild deficits observed, not formally tested                                           Pertinent Vitals/Pain Pain Assessment: Faces Faces Pain Scale: Hurts even more Pain Location: stomach, throat due to frequent coughing Pain Descriptors / Indicators: Sore;Grimacing Pain Intervention(s): Limited activity within patient's tolerance;Monitored during session;Repositioned    Home Living Family/patient expects to be discharged to:: Private residence   Available Help at Discharge: Family;Available 24 hours/day Type of Home: House Home Access: Stairs to enter Entrance Stairs-Rails: Right;Left (to wide to reach both) Entrance Stairs-Number of Steps: 5 Home Layout: Two  level;Laundry or work area in basement;Able to live on main level with Christian King: None      Prior Function Level of Independence: Independent         Comments: Tourist information centre manager, works as a  Public librarian: Right    Extremity/Trunk Assessment   Upper Extremity Assessment Upper Extremity Assessment: Overall WFL for tasks assessed    Lower Extremity Assessment Lower Extremity Assessment: Overall WFL for tasks assessed    Cervical / Trunk Assessment Cervical / Trunk Assessment: Normal  Communication   Communication: No difficulties  Cognition Arousal/Alertness: Awake/alert Behavior During Therapy: WFL for tasks assessed/performed Overall Cognitive Status: Within Functional Limits for tasks assessed                                        General Comments      Exercises     Assessment/Plan    PT Assessment Patient needs continued PT services  PT Problem List Decreased strength;Decreased activity tolerance;Decreased balance;Decreased mobility       PT Treatment Interventions Gait training;Stair training;Functional mobility training;Therapeutic activities;Therapeutic exercise;Patient/family education;Balance training    PT Goals (Current goals can be found in the Care Plan section)  Acute Rehab PT Goals Patient Stated Goal: return home with family to assist PT Goal Formulation: With patient/family Time For Goal Achievement: 05/31/20 Potential to Achieve Goals: Good    Frequency Min 2X/week   Barriers to discharge        Co-evaluation               AM-PAC PT "6 Clicks" Mobility  Outcome Measure Help needed turning from your back to your side while in a flat bed without using bedrails?: None Help needed moving from lying on your back to sitting on the side of a flat bed without using bedrails?: A Little Help needed moving to and from a bed to a chair (including a wheelchair)?: A Little Help needed standing up from a chair using your arms (e.g., wheelchair or bedside chair)?: None Help needed to walk in hospital room?: A Little Help needed climbing 3-5 steps with a railing? : A Little 6 Click  Score: 20    End of Session   Activity Tolerance: Patient tolerated treatment well;Patient limited by fatigue Patient left: in chair;with call bell/phone within reach;with family/visitor present;with chair alarm set Nurse Communication: Mobility status PT Visit Diagnosis: Unsteadiness on feet (R26.81);Other abnormalities of gait and mobility (R26.89);Muscle weakness (generalized) (M62.81)    Time: 3419-3790 PT Time Calculation (min) (ACUTE ONLY): 20 min   Charges:   PT Evaluation $PT Eval Moderate Complexity: 1 Mod PT Treatments $Therapeutic Activity: 8-22 mins        11:22 AM, 05/24/20 Ocie Bob, MPT Physical Therapist with [facility] 336 [phone] office [phone] mobile phone

## 2020-05-24 NOTE — Plan of Care (Signed)
  Problem: Acute Rehab PT Goals(only PT should resolve) Goal: Pt Will Go Supine/Side To Sit Outcome: Progressing Flowsheets (Taken 05/24/2020 1123) Pt will go Supine/Side to Sit: with modified independence Goal: Patient Will Transfer Sit To/From Stand Outcome: Progressing Flowsheets (Taken 05/24/2020 1123) Patient will transfer sit to/from stand:  Independently  with modified independence Goal: Pt Will Transfer Bed To Chair/Chair To Bed Outcome: Progressing Flowsheets (Taken 05/24/2020 1123) Pt will Transfer Bed to Chair/Chair to Bed:  Independently  with modified independence Goal: Pt Will Ambulate Outcome: Progressing Flowsheets (Taken 05/24/2020 1123) Pt will Ambulate:  > 125 feet  Independently  with modified independence   11:24 AM, 05/24/20 Ocie Bob, MPT Physical Therapist with [facility] 336 [phone] office [phone] mobile phone

## 2020-05-24 NOTE — Plan of Care (Signed)

## 2020-05-24 NOTE — TOC Initial Note (Signed)
Transition of Care [facility]) - Initial/Assessment Note    Patient Details  Name: Christian King MRN: [id number] Date of Birth: [birth year]  [facility] CM/SW Contact:    Salome Arnt, LCSW Phone Number: 05/24/2020, 3:29 PM  Clinical Narrative:  Pt admitted due to alcohol intoxication. TOC received consult for substance abuse counseling. LCSW met with pt and pt's wife at bedside. 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 He admits to binge drinking on these 2 days, but was vague about amount and what he was drinking. Pt and wife did not agree on information provided. Pt went to [facility] about 6 years ago and was sober for about 6 months after. When asked if he believes alcohol is a problem for him, pt replied, "No." However, pt's wife disagrees. He is interested in outpatient options, but is limited due to work schedule. LCSW discussed pt looking up virtual substance abuse counseling options. Pt and wife were agreeable. LCSW provided outpatient substance abuse resource list and [place] brochure (inpatient). TOC will continue to follow.                    Expected Discharge Plan: Home/Self Care Barriers to Discharge: Continued Medical Work up   Patient Goals and CMS Choice Patient states their goals for this hospitalization and ongoing recovery are:: return home   Choice offered to / list presented to : Patient  Expected Discharge Plan and Services Expected Discharge Plan: Home/Self Care In-house Referral: Clinical Social Work     Living arrangements for the past 2 months: Single Family Home                 DME Arranged: N/A DME Agency: NA                  Prior Living Arrangements/Services Living arrangements for the past 2 months: Single Family Home Lives with:: Spouse Patient language and need for interpreter reviewed:: Yes Do you feel safe going back to the place where you live?: Yes       Need for Family Participation in Patient Care: No (Comment)     Criminal Activity/Legal Involvement Pertinent to Current Situation/Hospitalization: No - Comment as needed  Activities of Daily Living Home Assistive Devices/Equipment: (P) None ADL Screening (condition at time of admission) Patient's cognitive ability adequate to safely complete daily activities?: (P) Yes Is the patient deaf or have difficulty hearing?: (P) No Does the patient have difficulty seeing, even when wearing glasses/contacts?: (P) No Does the patient have difficulty concentrating, remembering, or making decisions?: (P) No Patient able to express need for assistance with ADLs?: (P) Yes Does the patient have difficulty dressing or bathing?: (P) No Independently performs ADLs?: (P) Yes (appropriate for developmental age) Does the patient have difficulty walking or climbing stairs?: (P) No Weakness of Legs: (P) None Weakness of Arms/Hands: (P) None  Permission Sought/Granted                  Emotional Assessment       Orientation: : Oriented to Self,Oriented to  Time,Oriented to Place,Oriented to Situation Alcohol / Substance Use: Alcohol Use Psych Involvement: No (comment)  Admission diagnosis:  Alcohol intoxication ([place]) [F10.929] Leg pain [M79.606] Acute respiratory failure, unspecified whether with hypoxia or hypercapnia ([place]) [J96.00] Alcohol intoxication with delirium ([place]) [F10.921] Patient Active Problem List   Diagnosis Date Noted  . Alcohol intoxication ([place]) 05/22/2020  .  Pain of right lower extremity   . DVT (deep venous thrombosis) ([place]) 05/15/2016  . Hypokalemia 03/17/2016  . 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  . Acute respiratory failure ([place]) 03/20/2015  . QT prolongation 03/20/2015  . BMI 25.0-25.9,adult 01/28/2015  . Insomnia 02/05/2014  . Hemochromatosis 10/03/2013  . Alcohol abuse 07/24/2010  . Hypothyroidism 04/26/2010  . Vitamin B12 deficiency 04/26/2010   . Hyperlipidemia 04/26/2010  . Gout 04/26/2010  . Essential hypertension 04/26/2010  . h/o VENTRICULAR TACHYCARDIA 04/26/2010   PCP:  Chevis Pretty, FNP Pharmacy:   [facility] [address] [place]  [address] [place] [address] Phone: [phone] Fax: [phone]  [facility] [place], [address] [place], [place] [address] Phone: [phone] Fax: [phone]  [id number] [place] [address]Phone: [phone]: [phone]    Social Determinants of Health (SDOH) Interventions    Readmission Risk Interventions No flowsheet data found.

## 2020-05-24 NOTE — Progress Notes (Signed)
PROGRESS NOTE   Christian King  YKZ:[id number] DOB: 1970-07-19 DOA: 05/21/2020 PCP: Bennie Pierini, FNP   Chief Complaint  Patient presents with  . Unresponsive   Level of care: Stepdown  Brief Admission History:  50 y.o. male, with history of hypothyroidism, hypertension, hyperlipidemia, GERD, B12 deficiency, and alcohol abuse presents to the ED with a chief complaint of difficulty breathing.  Upon arrival patient was not protecting his airway and had been bag-valve-mask assisted breathing with EMS in route.  He was intubated upon arrival and therefore is not able to provide any history.  Wife had reported that he drinks heavily (binges) and had been drinking today.  She reports that he hadn't drank today as much as he normally does.  She heard him vomit and then it sounded like he was having difficulty breathing so she called EMS.  ED provider called critical care at [facility] to inquire about starting antibiotics given possible aspiration, but critical care has advised against it unless he starts showing signs of infection.  Wife does report that patient has been complaining about right lower extremity pain for 3 weeks and does have a history of DVT.   Assessment & Plan:   Active Problems:   Vitamin B12 deficiency   Acute respiratory failure (HCC)   Alcohol intoxication (HCC)   Pain of right lower extremity  Acute alcohol poisoning  -  Pt presented with extremely high serum alcohol level and because of inability to protect airway was intubated and ventilated for 24 hours on a Precedex infusion with fentanyl infusion.  He was treated supportively with aggressive IV fluid hydration.   He was extubated on 4/4.  Consult to social work for alcohol treatment program.   Severe dehydration -presenting with markedly elevated lactic acid levels and this was treated with aggressive IV fluid boluses and hydration and lactic acid was followed and trended. He is eating and drinking better now  and will cut fluids.    Cough - check 2 view CXR.  Procalcitonin remains <0.10.   Acute metabolic encephalopathy -resolved now, treated supportively.   Chronic alcoholism with binge drinking disorder - He remains on CIWA protocol.  Wife interested in Antabuse program or librium.  TOC consulted.   Acute respiratory failure - Pt was successfully extubated by Dr. Sherene Sires pulmonologist.   Hypokalemia - additional oral replacement ordered.  Recheck in AM.   Hypomagnesemia - IV replacement ordered.    Hemochromatosis - iron level is currently low.  Wife says patient has not followed up regularly with hematology.  Follow up with hematologist outpatient.   Essential hypertension - stable, follow.   Hypothyroidism - reported by history, not taking thyroid hormone replacement. Not checking TSH in setting of critical illness.  Follow up outpatient.   Right Leg  Pain - US doppler negative for DVT.   Now that patient is awake, he tells me that his left leg is the leg that was hurting and he worried there was another blood clot in left leg.  I will add a venous doppler study to check the left leg for DVT.    Leg pain - added norco to his regimen.    Severe Vitamin B12 deficiency - Start weekly B12 injections and oral replacement.  Pt says he is familiar with giving his own IM B12 injections that he did years ago until he was told to stop by a provider.  He never followed up to have periodic testing of B12 and over the years  his levels have trended back down.  He likely has pernicious anemia and will need to take B12 injections for life.    DVT prophylaxis: enoxaparin Code Status: full  Family Communication: wife at bedside 05/24/20 Disposition: Home  Status is: Inpatient  Remains inpatient appropriate because:IV treatments appropriate due to intensity of illness or inability to take PO and Inpatient level of care appropriate due to severity of illness  Dispo: The patient is from: Home               Anticipated d/c is to: Home              Patient currently is not medically stable to d/c.   Difficult to place patient No   Consultants:   pulmonary  Procedures:     Antimicrobials:     Subjective: Pt complain of pain in legs and hips  Objective: Vitals:   05/24/20 0729 05/24/20 0800 05/24/20 0900 05/24/20 1000  BP:  133/63 124/67 122/65  Pulse: 77 62 80 83  Resp: 11 18 18 20   Temp: 99.1 F (37.3 C)     TempSrc: Oral     SpO2: 100% 98% 96% 97%  Weight:      Height:        Intake/Output Summary (Last 24 hours) at 05/24/2020 1113 Last data filed at 05/24/2020 0500 Gross per 24 hour  Intake 2739.08 ml  Output 875 ml  Net 1864.08 ml   Filed Weights   05/21/20 2348 05/22/20 0205 05/24/20 0500  Weight: 91.2 kg 91.2 kg 92 kg   Examination:  General exam: Appears calm and comfortable.   Respiratory system: Clear to auscultation. Respiratory effort normal. Cardiovascular system: normal S1 & S2 heard. No JVD, murmurs, rubs, gallops or clicks. No pedal edema. Gastrointestinal system: Abdomen is nondistended, soft and nontender. No organomegaly or masses felt. Normal bowel sounds heard. Central nervous system: Alert and oriented. No focal neurological deficits. Extremities: Symmetric 5 x 5 power. Skin: No rashes, lesions or ulcers Psychiatry: Judgement and insight appear poor. Mood & affect flat, agitated.   Data Reviewed: I have personally reviewed following labs and imaging studies  CBC: Recent Labs  Lab 05/21/20 2352 05/22/20 0526 05/23/20 0302 05/24/20 0635  WBC 8.1 11.8* 10.2 7.0  NEUTROABS 2.2  --  8.6* 5.2  HGB 14.5 12.9* 12.6* 10.7*  HCT 44.5 38.8* 37.9* 32.0*  MCV 100.7* 99.7 100.0 100.9*  PLT 249 202 193 151    Basic Metabolic Panel: Recent Labs  Lab 05/21/20 2352 05/22/20 0526 05/22/20 1533 05/23/20 0302 05/24/20 0635  NA 144 143  --  142 137  K 3.3* 3.1*  --  3.0* 3.0*  CL 107 106  --  110 108  CO2 25 21*  --  22 22  GLUCOSE 127* 117*   --  124* 136*  BUN 12 11  --  7 <5*  CREATININE 0.86 0.93  --  0.84 0.85  CALCIUM 8.3* 7.8*  --  7.7* 7.5*  MG  --   --  1.4* 1.9 2.0    GFR: Estimated Creatinine Clearance: 110 mL/min (by C-G formula based on SCr of 0.85 mg/dL).  Liver Function Tests: Recent Labs  Lab 05/21/20 2352 05/22/20 0526 05/23/20 0302 05/24/20 0635  AST 25 40 78* 57*  ALT 20 17 23 21   ALKPHOS 58 45 54 44  BILITOT 0.3 0.8 0.9 0.8  PROT 7.5 6.5 6.2* 5.7*  ALBUMIN 4.3 3.7 3.4* 3.0*    CBG: Recent Labs  Lab 05/22/20 1137 05/22/20 1725 05/23/20 0023 05/23/20 0212 05/23/20 0625  GLUCAP 103* 84 64* 126* 141*    Recent Results (from the past 240 hour(s))  Resp Panel by RT-PCR (Flu A&B, Covid) Nasopharyngeal Swab     Status: None   Collection Time: 05/22/20  1:30 AM   Specimen: Nasopharyngeal Swab; Nasopharyngeal(NP) swabs in vial transport medium  Result Value Ref Range Status   SARS Coronavirus 2 by RT PCR NEGATIVE NEGATIVE Final    Comment: (NOTE) SARS-CoV-2 target nucleic acids are NOT DETECTED.  The SARS-CoV-2 RNA is generally detectable in upper respiratory specimens during the acute phase of infection. The lowest concentration of SARS-CoV-2 viral copies this assay can detect is 138 copies/mL. A negative result does not preclude SARS-Cov-2 infection and should not be used as the sole basis for treatment or other patient management decisions. A negative result may occur with  improper specimen collection/handling, submission of specimen other than nasopharyngeal swab, presence of viral mutation(s) within the areas targeted by this assay, and inadequate number of viral copies(<138 copies/mL). A negative result must be combined with clinical observations, patient history, and epidemiological information. The expected result is Negative.  Fact Sheet for Patients:  BloggerCourse.com  Fact Sheet for Healthcare Providers:   SeriousBroker.it  This test is no t yet approved or cleared by the [place] FDA and  has been authorized for detection and/or diagnosis of SARS-CoV-2 by FDA under an Emergency Use Authorization (EUA). This EUA will remain  in effect (meaning this test can be used) for the duration of the COVID-19 declaration under Section 564(b)(1) of the Act, 21 U.S.C.section 360bbb-3(b)(1), unless the authorization is terminated  or revoked sooner.       Influenza A by PCR NEGATIVE NEGATIVE Final   Influenza B by PCR NEGATIVE NEGATIVE Final    Comment: (NOTE) The Xpert Xpress SARS-CoV-2/FLU/RSV plus assay is intended as an aid in the diagnosis of influenza from Nasopharyngeal swab specimens and should not be used as a sole basis for treatment. Nasal washings and aspirates are unacceptable for Xpert Xpress SARS-CoV-2/FLU/RSV testing.  Fact Sheet for Patients: BloggerCourse.com  Fact Sheet for Healthcare Providers: SeriousBroker.it  This test is not yet approved or cleared by the [place] FDA and has been authorized for detection and/or diagnosis of SARS-CoV-2 by FDA under an Emergency Use Authorization (EUA). This EUA will remain in effect (meaning this test can be used) for the duration of the COVID-19 declaration under Section 564(b)(1) of the Act, 21 U.S.C. section 360bbb-3(b)(1), unless the authorization is terminated or revoked.  Performed at [facility], [address] [place], [place] [address]   MRSA PCR Screening     Status: None   Collection Time: 05/22/20  5:08 AM   Specimen: Nasal Mucosa; Nasopharyngeal  Result Value Ref Range Status   MRSA by PCR NEGATIVE NEGATIVE Final    Comment:        The GeneXpert MRSA Assay (FDA approved for NASAL specimens only), is one component of a comprehensive MRSA colonization surveillance program. It is not intended to diagnose MRSA infection nor to guide  or monitor treatment for MRSA infections. Performed at [facility], [address], [place], [place] [address]   Culture, blood (Routine X 2) w Reflex to ID Panel     Status: None (Preliminary result)   Collection Time: 05/22/20  7:59 AM   Specimen: BLOOD RIGHT ARM  Result Value Ref Range Status   Specimen Description   Final  BLOOD RIGHT ARM BOTTLES DRAWN AEROBIC AND ANAEROBIC   Special Requests   Final    Blood Culture results may not be optimal due to an excessive volume of blood received in culture bottles   Culture   Final    NO GROWTH 2 DAYS Performed at [facility], [address], [place], [place] [address]    Report Status PENDING  Incomplete  Culture, blood (Routine X 2) w Reflex to ID Panel     Status: None (Preliminary result)   Collection Time: 05/22/20  7:59 AM   Specimen: BLOOD RIGHT HAND  Result Value Ref Range Status   Specimen Description   Final    BLOOD RIGHT HAND BOTTLES DRAWN AEROBIC AND ANAEROBIC   Special Requests   Final    Blood Culture results may not be optimal due to an excessive volume of blood received in culture bottles   Culture   Final    NO GROWTH 2 DAYS Performed at [facility], [address], [place], [place] [address]    Report Status PENDING  Incomplete     Radiology Studies: US Venous Img Lower Unilateral Right (DVT)  Result Date: 05/22/2020 CLINICAL DATA:  Right lower extremity pain. EXAM: RIGHT LOWER EXTREMITY VENOUS DOPPLER ULTRASOUND TECHNIQUE: Gray-scale sonography with compression, as well as color and duplex ultrasound, were performed to evaluate the deep venous system(s) from the level of the common femoral vein through the popliteal and proximal calf veins. COMPARISON:  None. FINDINGS: VENOUS Normal compressibility of the common femoral, superficial femoral, and popliteal veins, as well as the visualized calf veins. Visualized portions of profunda femoral vein and great saphenous vein unremarkable. No filling defects to suggest  DVT on grayscale or color Doppler imaging. Doppler waveforms show normal direction of venous flow, normal respiratory plasticity and response to augmentation. Limited views of the contralateral common femoral vein are unremarkable. OTHER None. Limitations: none IMPRESSION: Negative. Electronically Signed   By: Elberta Fortis M.D.   On: 05/22/2020 11:40   DG CHEST PORT 1 VIEW  Result Date: 05/23/2020 CLINICAL DATA:  Intubated, aspiration EXAM: PORTABLE CHEST 1 VIEW COMPARISON:  Prior chest x-ray yesterday FINDINGS: Patient remains intubated. The tip of the endotracheal tube is 4.8 cm above the carina. A gastric tube is present. The tip lies off the field of view but below the diaphragm presumably in the stomach. Cardiac and mediastinal contours remain unchanged. The lungs are relatively clear. No pulmonary edema, pleural effusion or pneumothorax. Mild patchy airspace opacity in the right lower lobe and linear airspace opacity in the left retrocardiac region. No acute osseous abnormality. IMPRESSION: 1. Stable and satisfactory support apparatus. 2. Mild patchy airspace opacity in the medial right lower lobe with linear opacity in the medial left lower lobe. Imaging findings are consistent with the reported clinical history of aspiration. Electronically Signed   By: Malachy Moan M.D.   On: 05/23/2020 08:02   Scheduled Meds: . chlorhexidine  15 mL Mouth Rinse BID  . Chlorhexidine Gluconate Cloth  6 each Topical Daily  . heparin  5,000 Units Subcutaneous Q8H  . LORazepam  0-4 mg Intravenous Q12H   Or  . LORazepam  0-4 mg Oral Q12H  . mouth rinse  15 mL Mouth Rinse q12n4p  . metoprolol succinate  25 mg Oral Daily  . pantoprazole  40 mg Oral Q0600  . thiamine  100 mg Oral Daily   Or  . thiamine  100 mg Intravenous Daily  . vitamin B-12  5,000  mcg Oral Daily   Continuous Infusions: . calcium gluconate    . dexmedetomidine (PRECEDEX) IV infusion Stopped (05/23/20 0949)  . fentaNYL infusion  INTRAVENOUS Stopped (05/23/20 0849)  . sodium chloride      LOS: 2 days   Time spent: 36 mins   [place] Toor Laural Benes, MD How to contact the [facility] Attending or Consulting provider 7A - 7P or covering provider during after hours 7P -7A, for this patient?  1. Check the care team in [facility] and look for a) attending/consulting [facility] provider listed and b) the [facility] team listed 2. Log into [email or web address] and use [place]'s universal password to access. If you do not have the password, please contact the hospital operator. 3. Locate the [facility] provider you are looking for under [facility] and page to a number that you can be directly reached. 4. If you still have difficulty reaching the provider, please page the [facility] (Director on Call) for the Hospitalists listed on amion for assistance.  05/24/2020, 11:13 AM

## 2020-05-25 LAB — CBC WITH DIFFERENTIAL/PLATELET
Abs Immature Granulocytes: 0.02 10*3/uL (ref 0.00–0.07)
Basophils Absolute: 0 10*3/uL (ref 0.0–0.1)
Basophils Relative: 1 %
Eosinophils Absolute: 0.1 10*3/uL (ref 0.0–0.5)
Eosinophils Relative: 2 %
HCT: 31 % — ABNORMAL LOW (ref 39.0–52.0)
Hemoglobin: 10.3 g/dL — ABNORMAL LOW (ref 13.0–17.0)
Immature Granulocytes: 0 %
Lymphocytes Relative: 28 %
Lymphs Abs: 1.3 10*3/uL (ref 0.7–4.0)
MCH: 33.4 pg (ref 26.0–34.0)
MCHC: 33.2 g/dL (ref 30.0–36.0)
MCV: 100.6 fL — ABNORMAL HIGH (ref 80.0–100.0)
Monocytes Absolute: 0.4 10*3/uL (ref 0.1–1.0)
Monocytes Relative: 8 %
Neutro Abs: 2.8 10*3/uL (ref 1.7–7.7)
Neutrophils Relative %: 61 %
Platelets: 145 10*3/uL — ABNORMAL LOW (ref 150–400)
RBC: 3.08 MIL/uL — ABNORMAL LOW (ref 4.22–5.81)
RDW: 12.6 % (ref 11.5–15.5)
WBC: 4.6 10*3/uL (ref 4.0–10.5)
nRBC: 0 % (ref 0.0–0.2)

## 2020-05-25 LAB — COMPREHENSIVE METABOLIC PANEL
ALT: 21 U/L (ref 0–44)
AST: 47 U/L — ABNORMAL HIGH (ref 15–41)
Albumin: 3 g/dL — ABNORMAL LOW (ref 3.5–5.0)
Alkaline Phosphatase: 43 U/L (ref 38–126)
Anion gap: 10 (ref 5–15)
BUN: 5 mg/dL — ABNORMAL LOW (ref 6–20)
CO2: 21 mmol/L — ABNORMAL LOW (ref 22–32)
Calcium: 8.4 mg/dL — ABNORMAL LOW (ref 8.9–10.3)
Chloride: 107 mmol/L (ref 98–111)
Creatinine, Ser: 0.86 mg/dL (ref 0.61–1.24)
GFR, Estimated: >60 mL/min (ref >60)
Glucose, Bld: 99 mg/dL (ref 70–99)
Potassium: 3.4 mmol/L — ABNORMAL LOW (ref 3.5–5.1)
Sodium: 138 mmol/L (ref 135–145)
Total Bilirubin: 0.8 mg/dL (ref 0.3–1.2)
Total Protein: 5.9 g/dL — ABNORMAL LOW (ref 6.5–8.1)

## 2020-05-25 LAB — MAGNESIUM: Magnesium: 1.8 mg/dL (ref 1.7–2.4)

## 2020-05-25 MED ORDER — VITAMIN B-12 1000 MCG PO TABS: 1000.0000 ug | ORAL_TABLET | Freq: Every day | ORAL | 3 refills | Status: DC

## 2020-05-25 MED ORDER — THIAMINE HCL 100 MG PO TABS: 100.0000 mg | ORAL_TABLET | Freq: Every day | ORAL | 5 refills | Status: DC

## 2020-05-25 MED ORDER — POTASSIUM CHLORIDE CRYS ER 10 MEQ PO TBCR: EXTENDED_RELEASE_TABLET | ORAL | 1 refills | Status: DC

## 2020-05-25 MED ORDER — CYANOCOBALAMIN 1000 MCG/ML IJ SOLN: 1000.0000 ug | INTRAMUSCULAR | 3 refills | Status: DC

## 2020-05-25 MED ORDER — POTASSIUM CHLORIDE CRYS ER 20 MEQ PO TBCR
40.0000 meq | EXTENDED_RELEASE_TABLET | Freq: Once | ORAL | Status: AC
  Administered 2020-05-25: 40 meq via ORAL
  Filled 2020-05-25: qty 2

## 2020-05-25 MED ORDER — PANTOPRAZOLE SODIUM 40 MG PO TBEC: 40.0000 mg | DELAYED_RELEASE_TABLET | Freq: Every day | ORAL | 2 refills | Status: DC

## 2020-05-25 MED ORDER — FOLIC ACID 1 MG PO TABS: 1.0000 mg | ORAL_TABLET | Freq: Every day | ORAL | 3 refills | Status: AC

## 2020-05-25 MED ORDER — MAGNESIUM OXIDE 400 MG PO TABS: 400.0000 mg | ORAL_TABLET | Freq: Every day | ORAL | 1 refills | Status: DC

## 2020-05-25 NOTE — Plan of Care (Signed)
  Problem: Education: Goal: Knowledge of General Education information will improve Description: Including pain rating scale, medication(s)/side effects and non-pharmacologic comfort measures 05/25/2020 1141 by Sheela Stack, RN Outcome: Adequate for Discharge 05/25/2020 1125 by Sheela Stack, RN Outcome: Progressing   Problem: Health Behavior/Discharge Planning: Goal: Ability to manage health-related needs will improve 05/25/2020 1141 by Sheela Stack, RN Outcome: Adequate for Discharge 05/25/2020 1125 by Sheela Stack, RN Outcome: Progressing   Problem: Clinical Measurements: Goal: Ability to maintain clinical measurements within normal limits will improve 05/25/2020 1141 by Sheela Stack, RN Outcome: Adequate for Discharge 05/25/2020 1125 by Sheela Stack, RN Outcome: Progressing Goal: Will remain free from infection 05/25/2020 1141 by Sheela Stack, RN Outcome: Adequate for Discharge 05/25/2020 1125 by Sheela Stack, RN Outcome: Progressing Goal: Diagnostic test results will improve 05/25/2020 1141 by Sheela Stack, RN Outcome: Adequate for Discharge 05/25/2020 1125 by Sheela Stack, RN Outcome: Progressing Goal: Respiratory complications will improve 05/25/2020 1141 by Sheela Stack, RN Outcome: Adequate for Discharge 05/25/2020 1125 by Sheela Stack, RN Outcome: Progressing Goal: Cardiovascular complication will be avoided 05/25/2020 1141 by Sheela Stack, RN Outcome: Adequate for Discharge 05/25/2020 1125 by Sheela Stack, RN Outcome: Progressing   Problem: Activity: Goal: Risk for activity intolerance will decrease 05/25/2020 1141 by Sheela Stack, RN Outcome: Adequate for Discharge 05/25/2020 1125 by Sheela Stack, RN Outcome: Progressing   Problem: Nutrition: Goal: Adequate nutrition will be maintained 05/25/2020 1141 by Sheela Stack, RN Outcome: Adequate for Discharge 05/25/2020 1125 by Sheela Stack, RN Outcome: Progressing   Problem:  Coping: Goal: Level of anxiety will decrease 05/25/2020 1141 by Sheela Stack, RN Outcome: Adequate for Discharge 05/25/2020 1125 by Sheela Stack, RN Outcome: Progressing   Problem: Elimination: Goal: Will not experience complications related to bowel motility 05/25/2020 1141 by Sheela Stack, RN Outcome: Adequate for Discharge 05/25/2020 1125 by Sheela Stack, RN Outcome: Progressing Goal: Will not experience complications related to urinary retention 05/25/2020 1141 by Sheela Stack, RN Outcome: Adequate for Discharge 05/25/2020 1125 by Sheela Stack, RN Outcome: Progressing   Problem: Pain Managment: Goal: General experience of comfort will improve 05/25/2020 1141 by Sheela Stack, RN Outcome: Adequate for Discharge 05/25/2020 1125 by Sheela Stack, RN Outcome: Progressing   Problem: Safety: Goal: Ability to remain free from injury will improve 05/25/2020 1141 by Sheela Stack, RN Outcome: Adequate for Discharge 05/25/2020 1125 by Sheela Stack, RN Outcome: Progressing   Problem: Skin Integrity: Goal: Risk for impaired skin integrity will decrease 05/25/2020 1141 by Sheela Stack, RN Outcome: Adequate for Discharge 05/25/2020 1125 by Sheela Stack, RN Outcome: Progressing

## 2020-05-25 NOTE — Discharge Instructions (Signed)
1) complete abstinence from alcohol and tobacco advised--- you have been provided outpatient substance abuse resource list and [facility] brochure (inpatient-- 2) for now you have refused alcohol rehab programs and alcohol rehab related medications--- please use the resources provided for you if you change your mind in the future 3) colonoscopy advised due to anemia and your age to rule out colon polyps and possibly colon cancer--you have refused colonoscopy at this time 4) abstinence from tobacco advised--- you may use over-the-counter nicotine patch to help you quit smoking 5) follow-up to primary care physician in 1 week or so for repeat CBC and CMP blood work

## 2020-05-25 NOTE — Discharge Summary (Signed)
Christian King, is a 50 y.o. male  DOB Nov 21, 1970  MRN [id number].  Admission date:  05/21/2020  Admitting Physician  Lilyan Gilford, DO  Discharge Date:  05/25/2020   Primary MD  Bennie Pierini, FNP  Recommendations for primary care physician for things to follow:   1) complete abstinence from alcohol and tobacco advised--- you have been provided outpatient substance abuse resource list and [facility] brochure (inpatient-- 2) for now you have refused alcohol rehab programs and alcohol rehab related medications--- please use the resources provided for you if you change your mind in the future 3) colonoscopy advised due to anemia and your age to rule out colon polyps and possibly colon cancer--you have refused colonoscopy at this time 4) abstinence from tobacco advised--- you may use over-the-counter nicotine patch to help you quit smoking 5) follow-up to primary care physician in 1 week or so for repeat CBC and CMP blood work   Admission Diagnosis  Alcohol intoxication (HCC) [F10.929] Leg pain [M79.606] Acute respiratory failure, unspecified whether with hypoxia or hypercapnia (HCC) [J96.00] Alcohol intoxication with delirium [facility]) [F10.921]   Discharge Diagnosis  Alcohol intoxication (HCC) [F10.929] Leg pain [M79.606] Acute respiratory failure, unspecified whether with hypoxia or hypercapnia (HCC) [J96.00] Alcohol intoxication with delirium (HCC) [F10.921]    Principal Problem:   Alcohol intoxication (HCC) Active Problems:   Vitamin B12 deficiency   Alcohol abuse   Acute respiratory failure (HCC)   Hypothyroidism   Essential hypertension   Hemochromatosis   Pain of right lower extremity      Past Medical History:  Diagnosis Date  . Alcohol abuse   . B12 deficiency   . Blood dyscrasia    hemachromatosis  . Bronchitis   . Degenerative joint disease (DJD) of  hip   . GERD (gastroesophageal reflux disease)   . Gout   . History of kidney stones   . HLD (hyperlipidemia)   . HTN (hypertension)   . Hypothyroidism   . Ventricular tachycardia (HCC)    in setting of hypokalemia/ hypomagnesemia and ETOH  . Wears glasses     Past Surgical History:  Procedure Laterality Date  . TOTAL HIP ARTHROPLASTY Right   . TOTAL HIP ARTHROPLASTY Left 03/16/2016   Procedure: TOTAL HIP ARTHROPLASTY;  Surgeon: Frederico Hamman, MD;  Location: [facility] OR;  Service: Orthopedics;  Laterality: Left;  . vascularized fibular graft     right hip for avascular necrosis     HPI  from the history and physical done on the day of admission:   Christian King  is a 50 y.o. male, with history of hypothyroidism, hypertension, hyperlipidemia, GERD, B12 deficiency, and alcohol abuse presents to the ED with a chief complaint of difficulty breathing.  Upon arrival patient was not protecting his airway and had been bag-valve-mask assisted breathing with EMS in route.  He was intubated upon arrival and therefore is not able to provide any history.  Wife had reported that he drinks heavily (binges) and had been drinking today.  She  reports that he hadn't drank today as much as he normally does.  She heard him vomit and then it sounded like he was having difficulty breathing so she called EMS.  ED provider called critical care at [facility] to inquire about starting antibiotics given possible aspiration, but critical care has advised against it unless he starts showing signs of infection.  Wife does report that patient has been complaining about right lower extremity pain for 3 weeks and does have a history of DVT.   In the ED Temp 97.7, heart rate 58, respiratory rate 22, blood pressure 98/72, satting 98% on ventilator Alcohol level 526 Salicylate level and Tylenol level are undetectable UDS is negative Chest x-ray shows no acute cardiopulmonary disease, ET tube 3.7 cm above carina CT head  shows no acute intracranial abnormalities EKG shows sinus bradycardia with a heart rate of 49 and a QTC of 403 ABG shows a pH of 7.38, CO2 41, O2 101 Respiratory panel is pending No leukocytosis with a white blood cell count of 8.1, hemoglobin 14.5 Chemistry panel reveals a hypokalemia at 3.3, lipase 53, ammonia 39, Trope less than 2 CIWA protocol was started in the ED, fentanyl drip and propofol for sedation, 1 L NaCl Admission to ICU    Hospital Course:     Brief Admission History:  50 y.o.male,with history of hypothyroidism, hypertension, hyperlipidemia, GERD, B12 deficiency, and alcohol abuse presents to the ED with a chief complaint of difficulty breathing. Upon arrival patient was not protecting his airway and had been bag-valve-mask assisted breathing with EMS in route. He was intubated upon arrival and therefore is not able to provide any history. Wife had reported that he drinks heavily (binges)and had been drinking today. She reports that he hadn't drank today as much as he normally does.She heard him vomit and then it sounded like he was having difficulty breathing so she called EMS. ED provider called critical care at [facility] to inquire about starting antibiotics given possible aspiration, but critical care has advised against it unless he starts showing signs of infection.  Wife does report that patient has been complaining about right lower extremity pain for 3 weeks and does have a history of DVT.  Assessment & Plan:   Active Problems:   Vitamin B12 deficiency   Acute respiratory failure (HCC)   Alcohol intoxication (HCC)   Pain of right lower extremity  Acute alcohol poisoning/intoxication -  Pt presented with extremely high serum alcohol level and because of inability to protect airway was intubated and ventilated for 24 hours on a Precedex infusion with fentanyl infusion.  He was treated supportively with aggressive IV fluid hydration.   He was extubated on  4/4.  Consult to social work for alcohol treatment program.  -Patient declined referral to outpatient or inpatient alcohol rehab programs -Patient is medically stable and is discharged home with his wife  Severe dehydration -presenting with markedly elevated lactic acid levels and this was treated with aggressive IV fluid boluses and hydration and lactic acid was followed and trended. He is eating and drinking better now and will cut fluids.   -Dehydration resolved with hydration IV and orally  Anemia--- patient declines colonoscopy, repeat CBC as outpatient advised  Acute metabolic encephalopathy -secondary to alcohol abuse, resolved now,   Chronic alcoholism with binge drinking disorder -  Wife interested in Antabuse program or librium.  TOC consulted.  -Pt Declines referral for Antabuse program, labrum or any other type of alcohol rehab interventions  Acute respiratory failure - Pt was successfully extubated by Dr. Sherene Sires pulmonologist.  -Ambulating on room air with no dyspnea on exertion or hypoxia  Hypokalemia/hypomagnesemia- due to underlying EtOH abuse and dehydration replaced  Hemochromatosis - iron level is currently low.  Wife says patient has not followed up regularly with hematology.  Follow up with hematologist outpatienty advised  Essential hypertension -follow-up with PCP advised  Hypothyroidism - reported by history, not taking thyroid hormone replacement. Not checking TSH in setting of critical illness.  Follow up outpatient.   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  He never followed up to have periodic testing of B12 and over the years his levels have trended  back down.  He likely has pernicious anemia -follow-up with PCP advised  Code Status: full  Family Communication: wife at bedside  Disposition: Home     Consultants:   pulmonary  Discharge Condition: Stable  Follow UP--- PCP  Diet and Activity recommendation:  As advised  Discharge Instructions    Discharge Instructions    Call MD for:  difficulty breathing, headache or visual disturbances   Complete by: As directed    Call MD for:  persistant dizziness or light-headedness   Complete by: As directed    Call MD for:  persistant nausea and vomiting   Complete by: As directed    Call MD for:  severe uncontrolled pain   Complete by: As directed    Call MD for:  temperature >100.4   Complete by: As directed    Diet - low sodium heart healthy   Complete by: As directed    Discharge instructions   Complete by: As directed    1) complete abstinence from alcohol and tobacco advised--- you have been provided outpatient substance abuse resource list and [facility] brochure (inpatient-- 2) for now you have refused alcohol rehab programs and alcohol rehab related medications--- please use the resources provided for you if you change your mind in the future 3) colonoscopy advised due to anemia and your age to rule out colon polyps and possibly colon cancer--you have refused colonoscopy at this time 4) abstinence from tobacco advised--- you may use over-the-counter nicotine patch to help you quit smoking 5) follow-up to primary care physician in 1 week or so for repeat CBC and CMP blood work   Increase activity slowly   Complete by: As directed        Discharge Medications     Allergies as of 05/25/2020      Reactions   Azithromycin Other (See Comments)   History of QT prolongation   Amoxicillin Hives, Itching   Hctz [hydrochlorothiazide] Rash   Tramadol Itching      Medication List    TAKE these medications   fenofibrate 54 MG tablet Take 1 tablet by mouth   daily   folic acid 1 MG tablet Commonly known as: FOLVITE Take 1 tablet (1 mg total) by mouth daily.   magnesium oxide 400 MG tablet Commonly known as: MAG-OX Take 1 tablet (400 mg total) by mouth daily.   metoprolol succinate 50 MG 24 hr tablet Commonly known as: TOPROL-XL TAKE 1 TABLET BY MOUTH ONCE  DAILY WITH A MEAL OR IMMEDIATELY AFTER A MEAL,   pantoprazole 40 MG tablet Commonly known as: PROTONIX Take 1 tablet (40 mg total) by mouth daily at 6 (six) AM. Start taking on: May 26, 2020   potassium chloride 10 MEQ tablet Commonly known as: Klor-Con M10 TAKE 1 TABLET BY MOUTH EVERY DAY   sildenafil 20 MG tablet Commonly known as: REVATIO TAKE TWO TO FIVE TABLETS BY MOUTH AS NEEDED FOR SEX   simvastatin 40 MG tablet Commonly known as: ZOCOR Take 1 tablet (40 mg total) by mouth daily.   thiamine 100 MG tablet Take 1 tablet (100 mg total) by mouth daily. Start taking on: May 26, 2020   cyanocobalamin 1000 MCG/ML injection Commonly known as: (VITAMIN B-12) Inject 1 mL (1,000 mcg total) into the muscle every 30 (thirty) days.   vitamin B-12 1000 MCG tablet Commonly known as: CYANOCOBALAMIN Take 1 tablet (1,000 mcg total) by mouth daily. Start taking on: May 26, 2020      Major procedures and Radiology Reports - PLEASE review detailed and final reports for all details, in brief -   DG Chest 2 View  Result Date: 05/24/2020 CLINICAL DATA:  Extubation yesterday. History of aspiration. Cough and congestion. EXAM: CHEST - 2 VIEW COMPARISON:  05/23/2020 FINDINGS: Nasogastric and endotracheal tubes have been removed. Minimal blunting of the left lateral and posterior costophrenic angles. Cardiac and mediastinal margins appear normal. Mild linear opacities at the left lung base compatible with atelectasis or resolving pneumonia. IMPRESSION: 1. Mild residual linear opacity at the left lung base probably from atelectasis or resolving pneumonia. Minimal blunting of the left  costophrenic angle suggesting trace pleural effusion. Electronically Signed   By: Gaylyn Rong M.D.   On: 05/24/2020 16:43   CT HEAD WO CONTRAST  Result Date: 05/22/2020 CLINICAL DATA:  Unresponsive. Mental status change of unknown cause. Alcohol use. EXAM: CT HEAD WITHOUT CONTRAST TECHNIQUE: Contiguous axial images were obtained from the base of the skull through the vertex without intravenous contrast. COMPARISON:  None. FINDINGS: Brain: No evidence of acute infarction, hemorrhage, hydrocephalus, extra-axial collection or mass lesion/mass effect. Mild diffuse cerebral atrophy. Vascular: No hyperdense vessel or unexpected calcification. Skull: Normal. Negative for fracture or focal lesion. Sinuses/Orbits: No acute finding. Other: None. IMPRESSION: 1. No acute intracranial abnormalities. 2. Mild diffuse cerebral atrophy. Electronically Signed   By: Burman Nieves M.D.   On: 05/22/2020 01:14   US Venous Img Lower Unilateral Left (DVT)  Result Date: 05/24/2020 CLINICAL DATA:  Left lower extremity pain the past 3 weeks. History of previous DVT and smoking. EXAM: LEFT LOWER EXTREMITY VENOUS DOPPLER ULTRASOUND TECHNIQUE: Gray-scale sonography with graded compression, as well as color Doppler and duplex ultrasound were performed to evaluate the lower extremity deep venous systems from the level of the common femoral vein and including the common femoral, femoral, profunda femoral, popliteal and calf veins including the posterior tibial, peroneal and gastrocnemius veins when visible. The superficial great saphenous vein was also interrogated. Spectral Doppler was utilized to evaluate flow at rest and with distal augmentation maneuvers in the common femoral, femoral and popliteal veins. COMPARISON:  None. FINDINGS: Contralateral Common Femoral Vein: Respiratory phasicity is normal and symmetric with the symptomatic side. No evidence of thrombus. Normal compressibility. Common Femoral Vein: No evidence of  thrombus. Normal compressibility, respiratory phasicity and response to augmentation. Saphenofemoral Junction: No evidence of thrombus. Normal compressibility and flow on color Doppler imaging. Profunda Femoral Vein: No evidence of thrombus. Normal compressibility and flow  on color Doppler imaging. Femoral Vein: While the proximal aspect of the left femoral vein appears widely patent, there is hypoechoic nonocclusive wall thickening/chronic DVT involving the mid (image 18) and distal (image 22) aspects of the left femoral vein. Popliteal Vein: No evidence of thrombus. Normal compressibility, respiratory phasicity and response to augmentation. Calf Veins: No evidence of thrombus. Normal compressibility and flow on color Doppler imaging. Superficial Great Saphenous Vein: No evidence of thrombus. Normal compressibility. Venous Reflux:  None. Other Findings:  None. IMPRESSION: 1. No evidence of acute DVT within the left lower extremity. 2. Examination is positive for nonocclusive wall thickening/chronic DVT involving the mid and distal aspects of the left femoral vein. Electronically Signed   By: Simonne ComeJohn  Watts M.D.   On: 05/24/2020 13:11   US Venous Img Lower Unilateral Right (DVT)  Result Date: 05/22/2020 CLINICAL DATA:  Right lower extremity pain. EXAM: RIGHT LOWER EXTREMITY VENOUS DOPPLER ULTRASOUND TECHNIQUE: Gray-scale sonography with compression, as well as color and duplex ultrasound, were performed to evaluate the deep venous system(s) from the level of the common femoral vein through the popliteal and proximal calf veins. COMPARISON:  None. FINDINGS: VENOUS Normal compressibility of the common femoral, superficial femoral, and popliteal veins, as well as the visualized calf veins. Visualized portions of profunda femoral vein and great saphenous vein unremarkable. No filling defects to suggest DVT on grayscale or color Doppler imaging. Doppler waveforms show normal direction of venous flow, normal respiratory  plasticity and response to augmentation. Limited views of the contralateral common femoral vein are unremarkable. OTHER None. Limitations: none IMPRESSION: Negative. Electronically Signed   By: Elberta Fortisaniel  Boyle M.D.   On: 05/22/2020 11:40   DG CHEST PORT 1 VIEW  Result Date: 05/23/2020 CLINICAL DATA:  Intubated, aspiration EXAM: PORTABLE CHEST 1 VIEW COMPARISON:  Prior chest x-ray yesterday FINDINGS: Patient remains intubated. The tip of the endotracheal tube is 4.8 cm above the carina. A gastric tube is present. The tip lies off the field of view but below the diaphragm presumably in the stomach. Cardiac and mediastinal contours remain unchanged. The lungs are relatively clear. No pulmonary edema, pleural effusion or pneumothorax. Mild patchy airspace opacity in the right lower lobe and linear airspace opacity in the left retrocardiac region. No acute osseous abnormality. IMPRESSION: 1. Stable and satisfactory support apparatus. 2. Mild patchy airspace opacity in the medial right lower lobe with linear opacity in the medial left lower lobe. Imaging findings are consistent with the reported clinical history of aspiration. Electronically Signed   By: Malachy MoanHeath  McCullough M.D.   On: 05/23/2020 08:02   DG Chest Portable 1 View  Result Date: 05/22/2020 CLINICAL DATA:  Post intubation EXAM: PORTABLE CHEST 1 VIEW COMPARISON:  05/15/2016 FINDINGS: Endotracheal tube 3.7 cm above the carina. NG tube is in the stomach. Heart is normal size. Lungs clear. No effusions or acute bony abnormality. IMPRESSION: No acute cardiopulmonary disease. Electronically Signed   By: Charlett NoseKevin  Dover M.D.   On: 05/22/2020 00:25   Micro Results   Recent Results (from the past 240 hour(s))  Resp Panel by RT-PCR (Flu A&B, Covid) Nasopharyngeal Swab     Status: None   Collection Time: 05/22/20  1:30 AM   Specimen: Nasopharyngeal Swab; Nasopharyngeal(NP) swabs in vial transport medium  Result Value Ref Range Status   SARS Coronavirus 2 by RT  PCR NEGATIVE NEGATIVE Final    Comment: (NOTE) SARS-CoV-2 target nucleic acids are NOT DETECTED.  The SARS-CoV-2 RNA is generally detectable in upper respiratory specimens  during the acute phase of infection. The lowest concentration of SARS-CoV-2 viral copies this assay can detect is 138 copies/mL. A negative result does not preclude SARS-Cov-2 infection and should not be used as the sole basis for treatment or other patient management decisions. A negative result may occur with  improper specimen collection/handling, submission of specimen other than nasopharyngeal swab, presence of viral mutation(s) within the areas targeted by this assay, and inadequate number of viral copies(<138 copies/mL). A negative result must be combined with clinical observations, patient history, and epidemiological information. The expected result is Negative.  Fact Sheet for Patients:  BloggerCourse.com  Fact Sheet for Healthcare Providers:  SeriousBroker.it  This test is no t yet approved or cleared by the [place] FDA and  has been authorized for detection and/or diagnosis of SARS-CoV-2 by FDA under an Emergency Use Authorization (EUA). This EUA will remain  in effect (meaning this test can be used) for the duration of the COVID-19 declaration under Section 564(b)(1) of the Act, 21 U.S.C.section 360bbb-3(b)(1), unless the authorization is terminated  or revoked sooner.       Influenza A by PCR NEGATIVE NEGATIVE Final   Influenza B by PCR NEGATIVE NEGATIVE Final    Comment: (NOTE) The Xpert Xpress SARS-CoV-2/FLU/RSV plus assay is intended as an aid in the diagnosis of influenza from Nasopharyngeal swab specimens and should not be used as a sole basis for treatment. Nasal washings and aspirates are unacceptable for Xpert Xpress SARS-CoV-2/FLU/RSV testing.  Fact Sheet for Patients: BloggerCourse.com  Fact Sheet for  Healthcare Providers: SeriousBroker.it  This test is not yet approved or cleared by the [place] FDA and has been authorized for detection and/or diagnosis of SARS-CoV-2 by FDA under an Emergency Use Authorization (EUA). This EUA will remain in effect (meaning this test can be used) for the duration of the COVID-19 declaration under Section 564(b)(1) of the Act, 21 U.S.C. section 360bbb-3(b)(1), unless the authorization is terminated or revoked.  Performed at [facility], [address] [place], [place] [address]   MRSA PCR Screening     Status: None   Collection Time: 05/22/20  5:08 AM   Specimen: Nasal Mucosa; Nasopharyngeal  Result Value Ref Range Status   MRSA by PCR NEGATIVE NEGATIVE Final    Comment:        The GeneXpert MRSA Assay (FDA approved for NASAL specimens only), is one component of a comprehensive MRSA colonization surveillance program. It is not intended to diagnose MRSA infection nor to guide or monitor treatment for MRSA infections. Performed at [facility], [address], [place], [place] [address]   Culture, blood (Routine X 2) w Reflex to ID Panel     Status: None (Preliminary result)   Collection Time: 05/22/20  7:59 AM   Specimen: BLOOD RIGHT ARM  Result Value Ref Range Status   Specimen Description   Final    BLOOD RIGHT ARM BOTTLES DRAWN AEROBIC AND ANAEROBIC   Special Requests   Final    Blood Culture results may not be optimal due to an excessive volume of blood received in culture bottles   Culture   Final    NO GROWTH 3 DAYS Performed at [facility], [address], [place], [place] [address]    Report Status PENDING  Incomplete  Culture, blood (Routine X 2) w Reflex to ID Panel     Status: None (Preliminary result)   Collection Time: 05/22/20  7:59 AM   Specimen: BLOOD RIGHT HAND  Result Value  Ref Range Status   Specimen Description   Final    BLOOD RIGHT HAND BOTTLES DRAWN AEROBIC AND ANAEROBIC   Special  Requests   Final    Blood Culture results may not be optimal due to an excessive volume of blood received in culture bottles   Culture   Final    NO GROWTH 3 DAYS Performed at [facility], [address], [place], [place] [address]    Report Status PENDING  Incomplete    Today   Subjective    Christian King today has no new complaints Wife at bedside, eating and drinking well       -Ambulating hallways without dizziness, no chest pains no palpitations no dyspnea on exertion and no hypoxia   Patient has been seen and examined prior to discharge   Objective   Blood pressure 125/74, pulse 78, temperature 97.7 F (36.5 C), temperature source Oral, resp. rate 18, height  (1.702 m), weight 92 kg, SpO2 100 %.   Intake/Output Summary (Last 24 hours) at 05/25/2020 1203 Last data filed at 05/25/2020 0900 Gross per 24 hour  Intake 3262.97 ml  Output 500 ml  Net 2762.97 ml    Exam Gen:- Awake Alert, no acute distress  HEENT:- [place].AT, No sclera icterus Neck-Supple Neck,No JVD,.  Lungs-  CTAB , good air movement bilaterally  CV- S1, S2 normal, regular Abd-  +ve B.Sounds, Abd Soft, No tenderness,    Extremity/Skin:- No  edema,   good pulses Psych-affect is appropriate, oriented x3 Neuro-no new focal deficits, no tremors    Data Review   CBC w Diff:  Lab Results  Component Value Date   WBC 4.6 05/25/2020   HGB 10.3 (L) 05/25/2020   HGB 14.1 02/08/2020   HCT 31.0 (L) 05/25/2020   HCT 40.7 02/08/2020   PLT 145 (L) 05/25/2020   PLT 199 02/08/2020   LYMPHOPCT 28 05/25/2020   BANDSPCT 0 03/05/2016   MONOPCT 8 05/25/2020   EOSPCT 2 05/25/2020   BASOPCT 1 05/25/2020    CMP:  Lab Results  Component Value Date   NA 138 05/25/2020   NA 135 02/08/2020   K 3.4 (L) 05/25/2020   CL 107 05/25/2020   CO2 21 (L) 05/25/2020   BUN 5 (L) 05/25/2020   BUN 10 02/08/2020   CREATININE 0.86 05/25/2020   PROT 5.9 (L) 05/25/2020   PROT 6.9 02/08/2020   ALBUMIN 3.0 (L) 05/25/2020    ALBUMIN 4.2 02/08/2020   BILITOT 0.8 05/25/2020   BILITOT 1.0 02/08/2020   ALKPHOS 43 05/25/2020   AST 47 (H) 05/25/2020   ALT 21 05/25/2020  .   Total Discharge time is about 33 minutes  Shon Hale M.D on 05/25/2020 at 12:03 PM  Go to [email or web address] -  for contact info  [facility] - Office  [phone]

## 2020-05-25 NOTE — Plan of Care (Signed)

## 2020-05-27 ENCOUNTER — Telehealth: Payer: Self-pay

## 2020-05-27 LAB — CULTURE, BLOOD (ROUTINE X 2)
Culture: NO GROWTH
Culture: NO GROWTH

## 2020-05-27 NOTE — Telephone Encounter (Signed)
Patient came in to office and made appointment for next week.

## 2020-05-31 ENCOUNTER — Ambulatory Visit: Payer: BC Managed Care – PPO | Admitting: Nurse Practitioner

## 2020-05-31 ENCOUNTER — Encounter: Payer: Self-pay | Admitting: Nurse Practitioner

## 2020-05-31 ENCOUNTER — Other Ambulatory Visit: Payer: Self-pay

## 2020-05-31 VITALS — BP 126/80 | HR 79 | Temp 98.2°F | Resp 20 | Ht 67.0 in | Wt 174.0 lb

## 2020-05-31 DIAGNOSIS — Z09 Encounter for follow-up examination after completed treatment for conditions other than malignant neoplasm: Secondary | ICD-10-CM

## 2020-05-31 DIAGNOSIS — F10921 Alcohol use, unspecified with intoxication delirium: Secondary | ICD-10-CM | POA: Diagnosis not present

## 2020-05-31 NOTE — Progress Notes (Signed)
   Subjective:    Patient ID: Christian King, male    DOB: 08-20-1970, 50 y.o.   MRN: [id number]   Chief Complaint: Needs ok to return to work   HPI Patient come sin today for hospital follow up. He was admitted to the hospital with alcohol intoxication, delirium and acute resp failure. He had to be placed on ventilator. His alcohol level was >500 upon admission. Patient says when asked why he was in hospital , thatit was because his potassium and magnesium were low. He would not admit that his alcohol level was elevated. He doe snot want o admit that he has an alcohol problem. When discharged from hospital he refused any alcohol rehab. He says he does not have a problem.  Review of Systems  Constitutional: Negative for diaphoresis.  Eyes: Negative for pain.  Respiratory: Negative for shortness of breath.   Cardiovascular: Negative for chest pain, palpitations and leg swelling.  Gastrointestinal: Negative for abdominal pain.  Endocrine: Negative for polydipsia.  Skin: Negative for rash.  Neurological: Negative for dizziness, weakness and headaches.  Hematological: Does not bruise/bleed easily.  All other systems reviewed and are negative.      Objective:   Physical Exam Vitals and nursing note reviewed.  Constitutional:      Appearance: Normal appearance.  Cardiovascular:     Rate and Rhythm: Normal rate and regular rhythm.     Heart sounds: Normal heart sounds.  Pulmonary:     Effort: Pulmonary effort is normal.     Breath sounds: Normal breath sounds.  Skin:    General: Skin is warm.  Neurological:     General: No focal deficit present.     Mental Status: He is alert.  Psychiatric:        Mood and Affect: Mood normal.        Behavior: Behavior normal.    BP 126/80   Pulse 79   Temp 98.2 F (36.8 C) (Temporal)   Resp 20   Ht $R'5\' 7"'PG$  (1.702 m)   Wt 174 lb (78.9 kg)   SpO2 97%   BMI 27.25 kg/m        Assessment & Plan:  Kae Heller in today with  chief complaint of Needs ok to return to work   1. Alcohol intoxication with delirium ([place]) Labs to be repeated Patient continues to refuse help with alcoholism- he says he doe snot have a problem. encouraged to aviod alcohol - CBC with Differential/Platelet - CMP14+EGFR - Magnesium  2. Hospital discharge follow-up All hospital records reviewed Patient wanted copy of hospital records but was told he has to get from the hospital    The above assessment and management plan was discussed with the patient. The patient verbalized understanding of and has agreed to the management plan. Patient is aware to call the clinic if symptoms persist or worsen. Patient is aware when to return to the clinic for a follow-up visit. Patient educated on when it is appropriate to go to the emergency department.   Mary-Margaret Hassell Done, FNP

## 2020-05-31 NOTE — Patient Instructions (Signed)
Alcohol Abuse and Dependence Information, Adult Alcohol is a widely available drug. People drink alcohol in different amounts. People who drink alcohol very often and in large amounts often have problems during and after drinking. They may develop what is called an alcohol use disorder. There are two main types of alcohol use disorders:  Alcohol abuse. This is when you use alcohol too much or too often. You may use alcohol to make yourself feel happy or to reduce stress. You may have a hard time setting a limit on the amount you drink.  Alcohol dependence. This is when you use alcohol consistently for a period of time, and your body changes as a result. This can make it hard to stop drinking because you may start to feel sick or feel different when you do not use alcohol. These symptoms are known as withdrawal. How can alcohol abuse and dependence affect me? Alcohol abuse and dependence can have a negative effect on your life. Drinking too much can lead to addiction. You may feel like you need alcohol to function normally. You may drink alcohol before work in the morning, during the day, or as soon as you get home from work in the evening. These actions can result in:  Poor work performance.  Job loss.  Financial problems.  Car crashes or criminal charges from driving after drinking alcohol.  Problems in your relationships with friends and family.  Losing the trust and respect of coworkers, friends, and family. Drinking heavily over a long period of time can permanently damage your body and brain, and can cause lifelong health issues, such as:  Damage to your liver or pancreas.  Heart problems, high blood pressure, or stroke.  Certain cancers.  Decreased ability to fight infections.  Brain or nerve damage.  Depression.  Early (premature) death. If you are careless or you crave alcohol, it is easy to drink more than your body can handle (overdose). Alcohol overdose is a serious  situation that requires hospitalization. It may lead to permanent injuries or death. What can increase my risk?  Having a family history of alcohol abuse.  Having depression or other mental health conditions.  Beginning to drink at an early age.  Binge drinking often.  Experiencing trauma, stress, and an unstable home life during childhood.  Spending time with people who drink often. What actions can I take to prevent or manage alcohol abuse and dependence?  Do not drink alcohol if: ? Your health care provider tells you not to drink. ? You are pregnant, may be pregnant, or are planning to become pregnant.  If you drink alcohol: ? Limit how much you use to:  0-1 drink a day for women.  0-2 drinks a day for men. ? Be aware of how much alcohol is in your drink. In the [place] one drink equals one 12 oz bottle of beer (355 mL), one 5 oz glass of wine (148 mL), or one 1 oz glass of hard liquor (44 mL).  Stop drinking if you have been drinking too much. This can be very hard to do if you are used to abusing alcohol. If you begin to have withdrawal symptoms, talk with your health care provider or a person that you trust. These symptoms may include anxiety, shaky hands, headache, nausea, sweating, or not being able to sleep.  Choose to drink nonalcoholic beverages in social gatherings and places where there may be alcohol. Activity  Spend more time on activities that you enjoy that do   not involve alcohol, like hobbies or exercise.  Find healthy ways to cope with stress, such as exercise, meditation, or spending time with people you care about. General information  Talk to your family, coworkers, and friends about supporting you in your efforts to stop drinking. If they drink, ask them not to drink around you. Spend more time with people who do not drink alcohol.  If you think that you have an alcohol dependency problem: ? Tell friends or family about your concerns. ? Talk with your  health care provider or another health professional about where to get help. ? Work with a therapist and a chemical dependency counselor. ? Consider joining a support group for people who struggle with alcohol abuse and dependence. Where to find support  Your health care provider.  SMART Recovery: www.smartrecovery.org Therapy and support groups  Local treatment centers or chemical dependency counselors.  Local AA groups in your community: www.aa.org   Where to find more information  Centers for Disease Control and Prevention: www.cdc.gov  National Institute on Alcohol Abuse and Alcoholism: www.niaaa.nih.gov  Alcoholics Anonymous (AA): www.aa.org Contact a health care provider if:  You drank more or for longer than you intended on more than one occasion.  You tried to stop drinking or to cut back on how much you drink, but you were not able to.  You often drink to the point of vomiting or passing out.  You want to drink so badly that you cannot think about anything else.  You have problems in your life due to drinking, but you continue to drink.  You keep drinking even though you feel anxious, depressed, or have experienced memory loss.  You have stopped doing the things you used to enjoy in order to drink.  You have to drink more than you used to in order to get the effect you want.  You experience anxiety, sweating, nausea, shakiness, and trouble sleeping when you try to stop drinking. Get help right away if:  You have thoughts about hurting yourself or others.  You have serious withdrawal symptoms, including: ? Confusion. ? Racing heart. ? High blood pressure. ? Fever. If you ever feel like you may hurt yourself or others, or have thoughts about taking your own life, get help right away. You can go to your nearest emergency department or call:  Your local emergency services (911 in the [place]  A suicide crisis helpline, such as the National Suicide Prevention  Lifeline at 1-800-273-8255. This is open 24 hours a day. Summary  Alcohol abuse and dependence can have a negative effect on your life. Drinking too much or too often can lead to addiction.  If you drink alcohol, limit how much you use.  If you are having trouble keeping your drinking under control, find ways to change your behavior. Hobbies, calming activities, exercise, or support groups can help.  If you feel you need help with changing your drinking habits, talk with your health care provider, a good friend, or a therapist, or go to an AA group. This information is not intended to replace advice given to you by your health care provider. Make sure you discuss any questions you have with your health care provider. Document Revised: 05/27/2018 Document Reviewed: 04/15/2018 Elsevier Patient Education  2021 Elsevier Inc.  

## 2020-06-01 LAB — CBC WITH DIFFERENTIAL/PLATELET
Basophils Absolute: 0.1 10*3/uL (ref 0.0–0.2)
Basos: 1 %
EOS (ABSOLUTE): 0.1 10*3/uL (ref 0.0–0.4)
Eos: 1 %
Hematocrit: 40.8 % (ref 37.5–51.0)
Hemoglobin: 13.3 g/dL (ref 13.0–17.7)
Immature Grans (Abs): 0.1 10*3/uL (ref 0.0–0.1)
Immature Granulocytes: 1 %
Lymphocytes Absolute: 1.7 10*3/uL (ref 0.7–3.1)
Lymphs: 22 %
MCH: 32.4 pg (ref 26.6–33.0)
MCHC: 32.6 g/dL (ref 31.5–35.7)
MCV: 99 fL — ABNORMAL HIGH (ref 79–97)
Monocytes Absolute: 1 10*3/uL — ABNORMAL HIGH (ref 0.1–0.9)
Monocytes: 13 %
Neutrophils Absolute: 4.6 10*3/uL (ref 1.4–7.0)
Neutrophils: 62 %
Platelets: 345 10*3/uL (ref 150–450)
RBC: 4.11 x10E6/uL — ABNORMAL LOW (ref 4.14–5.80)
RDW: 11.9 % (ref 11.6–15.4)
WBC: 7.5 10*3/uL (ref 3.4–10.8)

## 2020-06-01 LAB — CMP14+EGFR
ALT: 18 IU/L (ref 0–44)
AST: 17 IU/L (ref 0–40)
Albumin/Globulin Ratio: 1.9 (ref 1.2–2.2)
Albumin: 4.7 g/dL (ref 3.8–4.9)
Alkaline Phosphatase: 73 IU/L (ref 44–121)
BUN/Creatinine Ratio: 15 (ref 9–20)
BUN: 16 mg/dL (ref 6–24)
Bilirubin Total: 0.3 mg/dL (ref 0.0–1.2)
CO2: 23 mmol/L (ref 20–29)
Calcium: 10.2 mg/dL (ref 8.7–10.2)
Chloride: 101 mmol/L (ref 96–106)
Creatinine, Ser: 1.06 mg/dL (ref 0.76–1.27)
Globulin, Total: 2.5 g/dL (ref 1.5–4.5)
Glucose: 95 mg/dL (ref 65–99)
Potassium: 5.2 mmol/L (ref 3.5–5.2)
Sodium: 141 mmol/L (ref 134–144)
Total Protein: 7.2 g/dL (ref 6.0–8.5)
eGFR: 84 mL/min/{1.73_m2} (ref >59)

## 2020-06-01 LAB — MAGNESIUM: Magnesium: 1.8 mg/dL (ref 1.6–2.3)

## 2020-07-22 ENCOUNTER — Other Ambulatory Visit: Payer: Self-pay | Admitting: Nurse Practitioner

## 2020-07-22 DIAGNOSIS — E785 Hyperlipidemia, unspecified: Secondary | ICD-10-CM

## 2020-08-09 ENCOUNTER — Other Ambulatory Visit: Payer: Self-pay | Admitting: Nurse Practitioner

## 2020-08-09 DIAGNOSIS — I10 Essential (primary) hypertension: Secondary | ICD-10-CM

## 2020-08-15 ENCOUNTER — Ambulatory Visit: Payer: BC Managed Care – PPO | Admitting: Nurse Practitioner

## 2020-09-19 ENCOUNTER — Ambulatory Visit: Payer: BC Managed Care – PPO | Admitting: Nurse Practitioner

## 2020-09-22 ENCOUNTER — Encounter: Payer: Self-pay | Admitting: Nurse Practitioner

## 2020-10-03 ENCOUNTER — Ambulatory Visit: Payer: Self-pay | Admitting: Nurse Practitioner

## 2020-10-03 NOTE — Progress Notes (Deleted)
   Subjective:    Patient ID: Christian King, male    DOB: [birth year], 50 y.o.   MRN: [id number]      New complaints: ***  Social history: ***  Controlled substance contract: ***     Review of Systems     Objective:   Physical Exam        Assessment & Plan:

## 2020-10-05 ENCOUNTER — Encounter: Payer: Self-pay | Admitting: Nurse Practitioner

## 2020-12-12 ENCOUNTER — Other Ambulatory Visit: Payer: Self-pay | Admitting: Nurse Practitioner

## 2020-12-12 DIAGNOSIS — E785 Hyperlipidemia, unspecified: Secondary | ICD-10-CM

## 2020-12-12 DIAGNOSIS — I10 Essential (primary) hypertension: Secondary | ICD-10-CM

## 2020-12-20 IMAGING — DX LEFT HUMERUS - 2+ VIEW
3 series · 3 of 3 positions shown · non-contrast
Comparison: None.

CLINICAL DATA: Patient status post fall yesterday. Left upper arm
pain. Initial encounter.

EXAM:
LEFT HUMERUS - 2+ VIEW

[elbow ap]
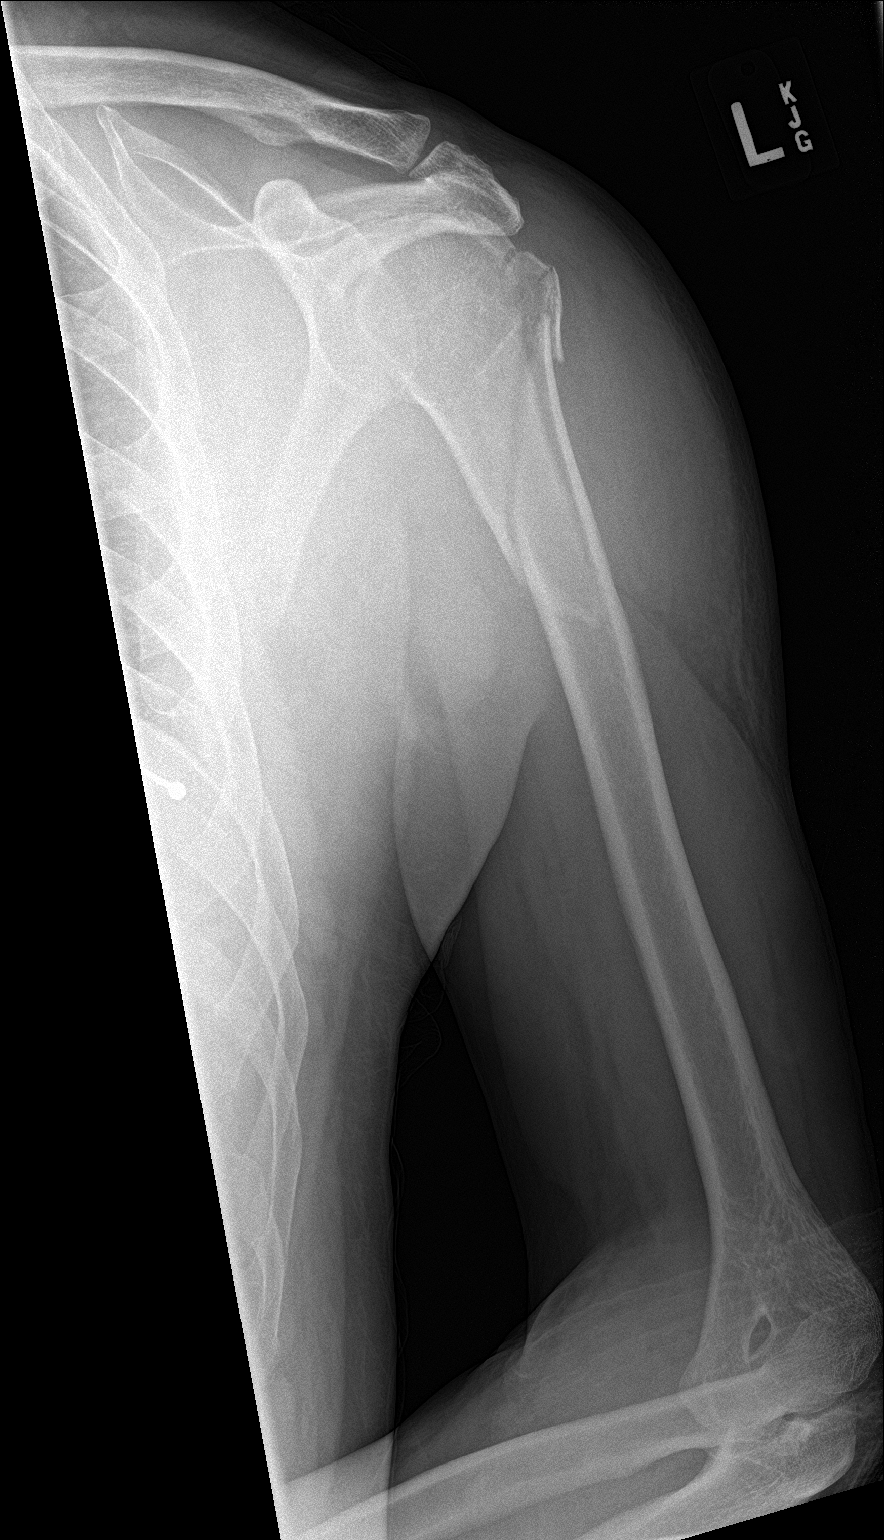

[elbow lat (1 of 2)]
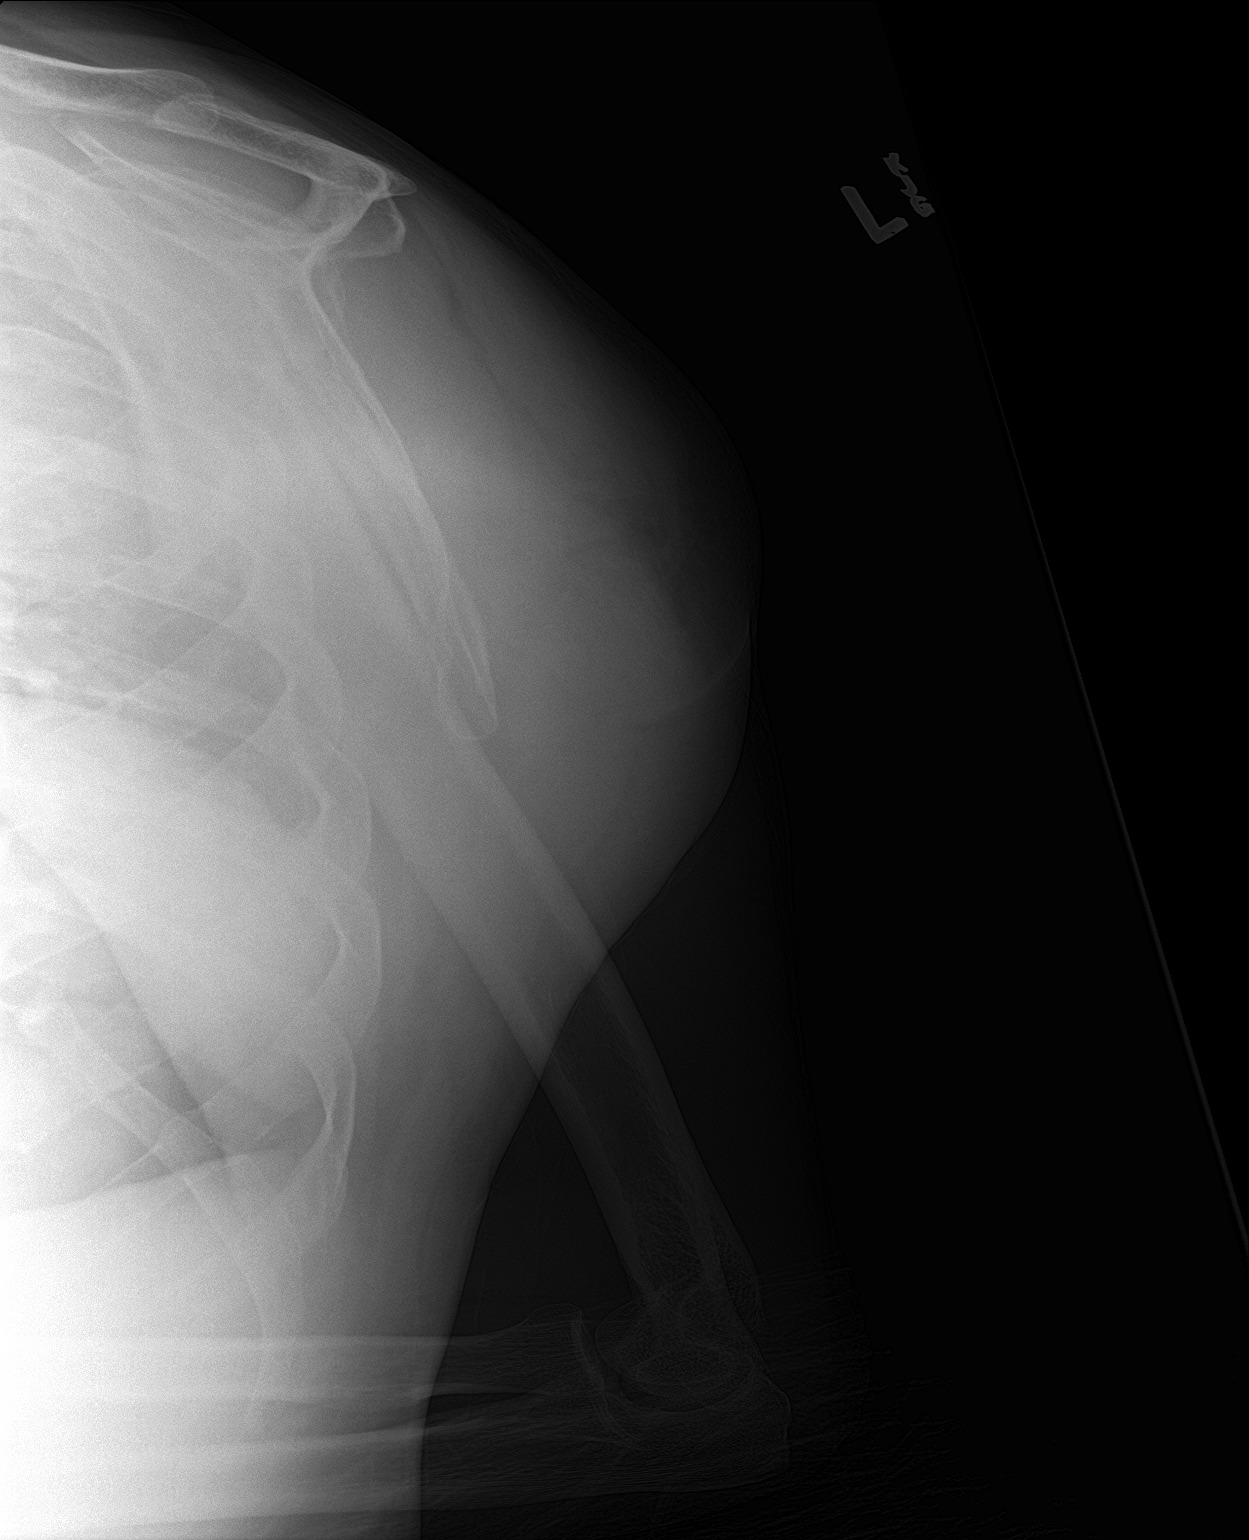

[elbow lat (2 of 2)]
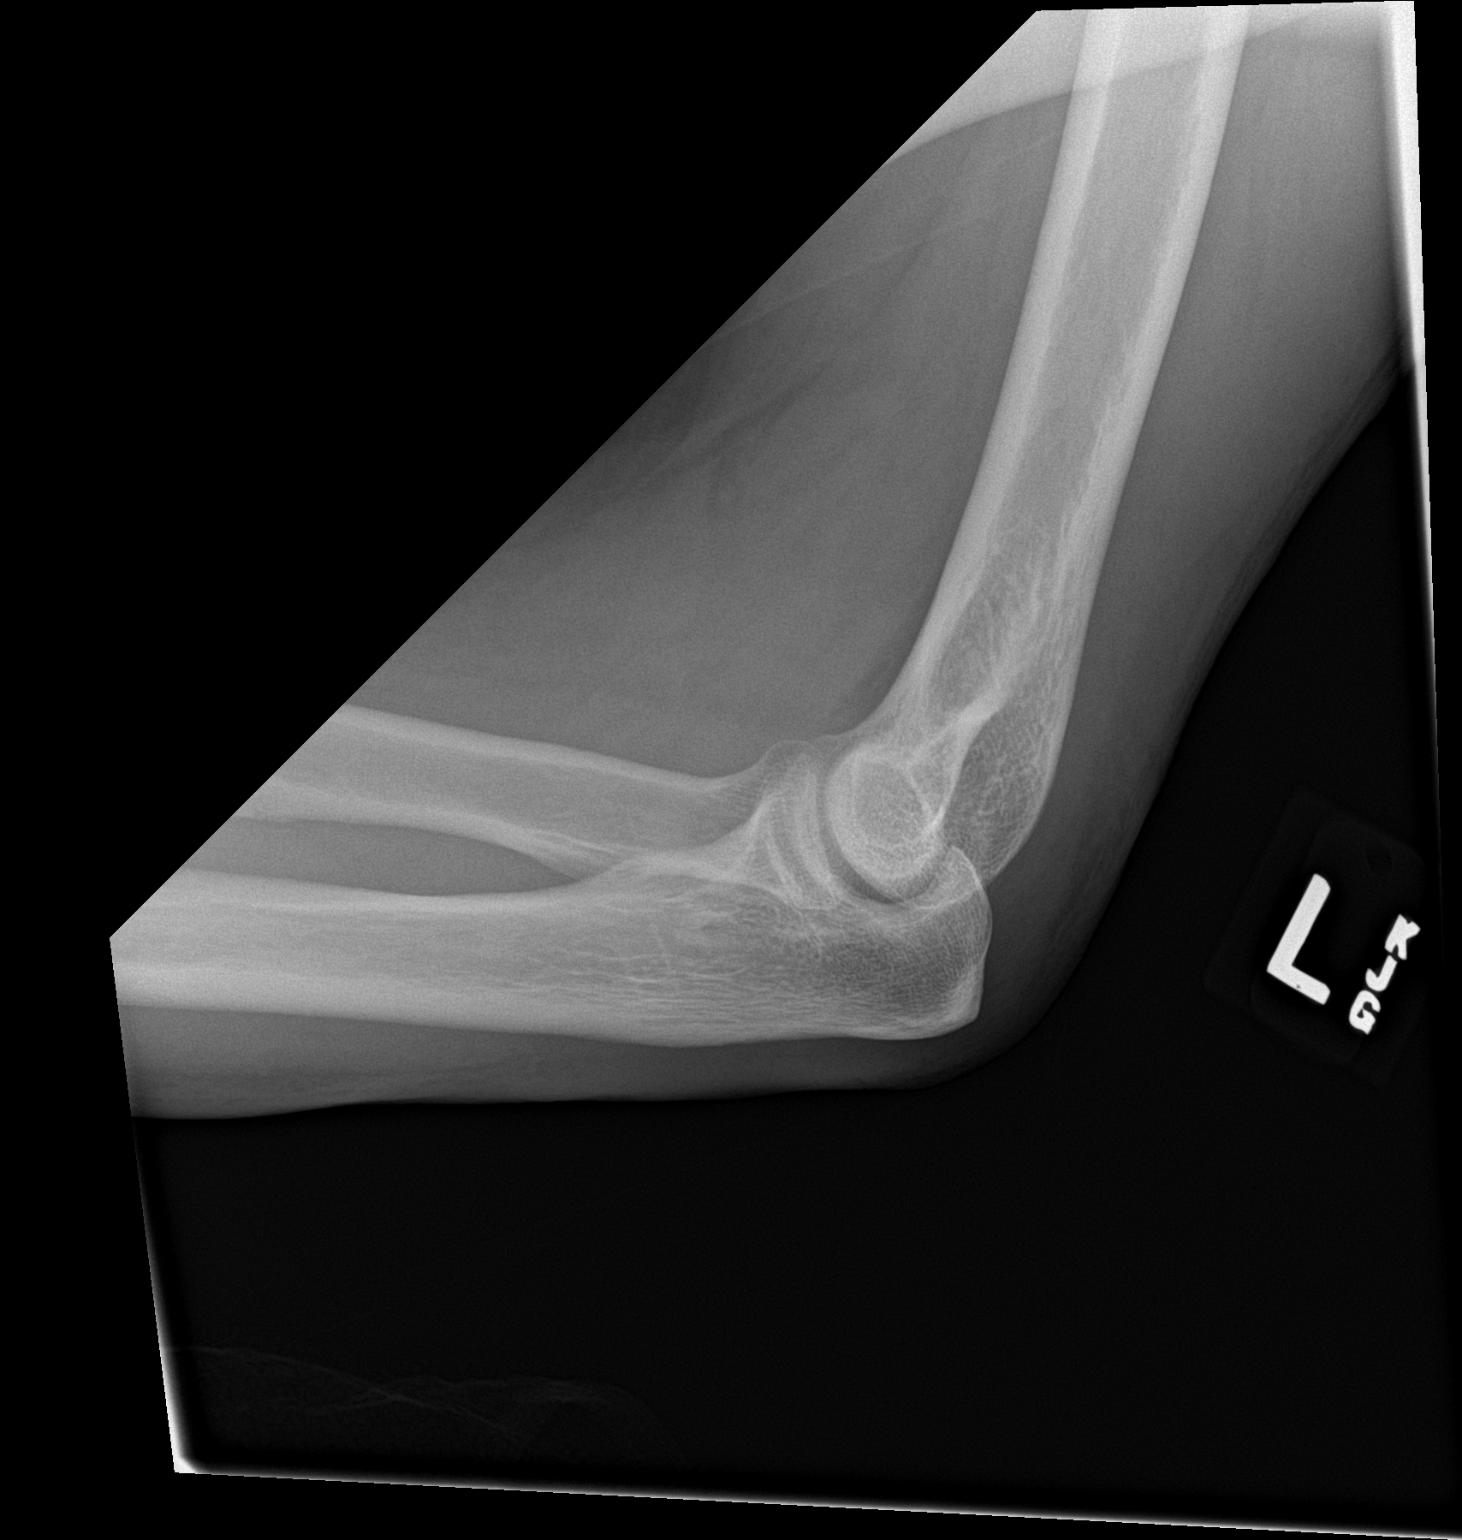

[3 of 3 positions shown; findings below may reference images not displayed]

FINDINGS: The patient has a nondisplaced spiral fracture of the proximal left
humerus. The fracture extends from the greater tuberosity into the
proximal diaphysis. No other bony or joint abnormality is
identified.
IMPRESSION: Acute nondisplaced spiral fracture proximal left humerus as
described.

## 2021-01-05 ENCOUNTER — Ambulatory Visit (INDEPENDENT_AMBULATORY_CARE_PROVIDER_SITE_OTHER): Payer: 59 | Admitting: Nurse Practitioner

## 2021-01-05 ENCOUNTER — Ambulatory Visit: Payer: 59 | Admitting: Nurse Practitioner

## 2021-01-05 ENCOUNTER — Encounter: Payer: Self-pay | Admitting: Nurse Practitioner

## 2021-01-05 DIAGNOSIS — R051 Acute cough: Secondary | ICD-10-CM | POA: Diagnosis not present

## 2021-01-05 MED ORDER — PREDNISONE 20 MG PO TABS
40.0000 mg | ORAL_TABLET | Freq: Every day | ORAL | 0 refills | Status: AC
Start: 2021-01-05 — End: 2021-01-10

## 2021-01-05 MED ORDER — BENZONATATE 100 MG PO CAPS: 100.0000 mg | ORAL_CAPSULE | Freq: Two times a day (BID) | ORAL | 0 refills | Status: DC | PRN

## 2021-01-05 NOTE — Progress Notes (Signed)
   Virtual Visit  Note Due to COVID-19 pandemic this visit was conducted virtually. This visit type was conducted due to national recommendations for restrictions regarding the COVID-19 Pandemic (e.g. social distancing, sheltering in place) in an effort to limit this patient's exposure and mitigate transmission in our community. All issues noted in this document were discussed and addressed.  A physical exam was not performed with this format.  I connected with Christian King on 01/05/21 at 9:00 by telephone and verified that I am speaking with the correct person using two identifiers. Christian King is currently located at home and no one is currently with him during visit. The provider, Mary-Margaret Daphine Deutscher, FNP is located in their office at time of visit.  I discussed the limitations, risks, security and privacy concerns of performing an evaluation and management service by telephone and the availability of in person appointments. I also discussed with the patient that there may be a patient responsible charge related to this service. The patient expressed understanding and agreed to proceed.   History and Present Illness:  Patient was suppose to have a follow up today, but he has a cough. Has had a cough for 5 days. No better. He has no fever or body aches.     Review of Systems  Constitutional:  Positive for malaise/fatigue. Negative for chills and fever.  HENT:  Positive for congestion. Negative for sore throat.   Respiratory:  Positive for cough and sputum production. Negative for shortness of breath.   Musculoskeletal:  Negative for myalgias.  Neurological:  Negative for headaches.    Observations/Objective: Alert and oriented- answers all questions appropriately No distress Dry cough noted during visit  Assessment and Plan: Christian King in today with chief complaint of No chief complaint on file.   1. Acute cough Force fluids  Rest Run humidifier -  benzonatate (TESSALON) 100 MG capsule; Take 1 capsule (100 mg total) by mouth 2 (two) times daily as needed for cough.  Dispense: 20 capsule; Refill: 0 - predniSONE (DELTASONE) 20 MG tablet; Take 2 tablets (40 mg total) by mouth daily with breakfast for 5 days. 2 po daily for 5 days  Dispense: 10 tablet; Refill: 0     Follow Up Instructions: 2 weeks    I discussed the assessment and treatment plan with the patient. The patient was provided an opportunity to ask questions and all were answered. The patient agreed with the plan and demonstrated an understanding of the instructions.   The patient was advised to call back or seek an in-person evaluation if the symptoms worsen or if the condition fails to improve as anticipated.  The above assessment and management plan was discussed with the patient. The patient verbalized understanding of and has agreed to the management plan. Patient is aware to call the clinic if symptoms persist or worsen. Patient is aware when to return to the clinic for a follow-up visit. Patient educated on when it is appropriate to go to the emergency department.   Time call ended:  9:12  I provided 11 minutes of  non face-to-face time during this encounter.    Mary-Margaret Daphine Deutscher, FNP

## 2021-01-10 ENCOUNTER — Other Ambulatory Visit: Payer: Self-pay | Admitting: *Deleted

## 2021-01-11 MED ORDER — VITAMIN B-12 1000 MCG PO TABS: 1000.0000 ug | ORAL_TABLET | Freq: Every day | ORAL | 3 refills | Status: DC

## 2021-01-19 ENCOUNTER — Ambulatory Visit: Payer: 59 | Admitting: Nurse Practitioner

## 2021-01-19 NOTE — Progress Notes (Deleted)
Subjective:    Patient ID: Christian King, male    DOB: 1970-07-02, 50 y.o.   MRN: [id number]  Chief Complaint: medical management of chronic issues     HPI:  1. Essential hypertension No c/o chest pain, sob or headache. Does not check blood pressure at home. BP Readings from Last 3 Encounters:  05/31/20 126/80  05/25/20 125/74  02/08/20 136/83     2. Acquired hypothyroidism No problems that he is aware of. Lab Results  Component Value Date   TSH 3.890 02/08/2020     3. Mixed hyperlipidemia Does not really watch diet and does little to no exercise. Lab Results  Component Value Date   CHOL 180 02/08/2020   HDL 39 (L) 02/08/2020   LDLCALC 40 02/08/2020   TRIG 730 (HH) 02/08/2020   CHOLHDL 4.6 02/08/2020     4. Hereditary hemochromatosis ([facility]) He usually will go and donate a pint of blood when he feels bad. Lab Results  Component Value Date   WBC 7.5 05/31/2020   HGB 13.3 05/31/2020   HCT 40.8 05/31/2020   MCV 99 (H) 05/31/2020   PLT 345 05/31/2020     5. Hypokalemia Denies any lower ext cramping Lab Results  Component Value Date   K 5.2 05/31/2020     6. Insomnia, unspecified type Is on no meds  for sleep. 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  7. Alcohol abuse  8. 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 BMI 25.0-25.9,adult No recent weight changes    Outpatient Encounter Medications as of 01/19/2021  Medication Sig   benzonatate (TESSALON) 100 MG capsule Take 1 capsule (100 mg total) by mouth 2 (two) times daily as needed for cough.   cyanocobalamin (,VITAMIN B-12,) 1000 MCG/ML injection Inject 1 mL (1,000 mcg total) into the muscle every 30 (thirty) days.   fenofibrate 54 MG tablet Take 1 tablet by mouth once daily   folic acid (FOLVITE) 1 MG tablet Take 1  tablet (1 mg total) by mouth daily.   magnesium oxide (MAG-OX) 400 MG tablet Take 1 tablet (400 mg total) by mouth daily.   metoprolol succinate (TOPROL-XL) 50 MG 24 hr tablet TAKE 1 TABLET BY MOUTH ONCE DAILY WITH  OR  IMMEDIATELY  AFTER  A  MEAL   pantoprazole (PROTONIX) 40 MG tablet Take 1 tablet (40 mg total) by mouth daily at 6 (six) AM.   potassium chloride (KLOR-CON M10) 10 MEQ tablet TAKE 1 TABLET BY MOUTH EVERY DAY   sildenafil (REVATIO) 20 MG tablet TAKE TWO TO FIVE TABLETS BY MOUTH AS NEEDED FOR SEX   simvastatin (ZOCOR) 40 MG tablet Take 1 tablet (40 mg total) by mouth daily.   thiamine 100 MG tablet Take 1 tablet (100 mg total) by mouth daily.   vitamin B-12 (CYANOCOBALAMIN) 1000 MCG tablet Take 1 tablet (1,000 mcg total) by mouth daily.   No facility-administered encounter medications on file as of 01/19/2021.    Past Surgical History:  Procedure Laterality Date   TOTAL HIP ARTHROPLASTY Right    TOTAL HIP ARTHROPLASTY Left 03/16/2016   Procedure: TOTAL HIP ARTHROPLASTY;  Surgeon: Frederico Hamman, MD;  Location: [facility] OR;  Service: Orthopedics;  Laterality: Left;   vascularized fibular graft     right hip for avascular necrosis  Family History  Problem Relation Age of Onset   Hypertension Father    Diabetes Maternal Grandmother    Hypertension Paternal Grandmother    Stroke Paternal Grandmother     New complaints: ***  Social history: ***  Controlled substance contract: ***      Review of Systems     Objective:   Physical Exam        Assessment & Plan:

## 2021-01-23 ENCOUNTER — Ambulatory Visit (INDEPENDENT_AMBULATORY_CARE_PROVIDER_SITE_OTHER): Payer: 59 | Admitting: Nurse Practitioner

## 2021-01-23 ENCOUNTER — Encounter: Payer: Self-pay | Admitting: Nurse Practitioner

## 2021-01-23 VITALS — BP 123/84 | HR 85 | Temp 97.8°F | Resp 20 | Ht 67.0 in | Wt 171.0 lb

## 2021-01-23 DIAGNOSIS — Z86718 Personal history of other venous thrombosis and embolism: Secondary | ICD-10-CM

## 2021-01-23 DIAGNOSIS — E782 Mixed hyperlipidemia: Secondary | ICD-10-CM | POA: Diagnosis not present

## 2021-01-23 DIAGNOSIS — G4700 Insomnia, unspecified: Secondary | ICD-10-CM

## 2021-01-23 DIAGNOSIS — E876 Hypokalemia: Secondary | ICD-10-CM | POA: Diagnosis not present

## 2021-01-23 DIAGNOSIS — E538 Deficiency of other specified B group vitamins: Secondary | ICD-10-CM

## 2021-01-23 DIAGNOSIS — F1010 Alcohol abuse, uncomplicated: Secondary | ICD-10-CM

## 2021-01-23 DIAGNOSIS — I10 Essential (primary) hypertension: Secondary | ICD-10-CM | POA: Diagnosis not present

## 2021-01-23 DIAGNOSIS — E039 Hypothyroidism, unspecified: Secondary | ICD-10-CM | POA: Diagnosis not present

## 2021-01-23 MED ORDER — METOPROLOL SUCCINATE ER 50 MG PO TB24: ORAL_TABLET | ORAL | 1 refills | Status: DC

## 2021-01-23 MED ORDER — SIMVASTATIN 40 MG PO TABS: 40.0000 mg | ORAL_TABLET | Freq: Every day | ORAL | 1 refills | Status: DC

## 2021-01-23 MED ORDER — THIAMINE HCL 100 MG PO TABS: 100.0000 mg | ORAL_TABLET | Freq: Every day | ORAL | 1 refills | Status: DC

## 2021-01-23 MED ORDER — LORAZEPAM 2 MG PO TABS: ORAL_TABLET | ORAL | 0 refills | Status: DC

## 2021-01-23 MED ORDER — POTASSIUM CHLORIDE CRYS ER 10 MEQ PO TBCR: EXTENDED_RELEASE_TABLET | ORAL | 1 refills | Status: DC

## 2021-01-23 MED ORDER — FENOFIBRATE 54 MG PO TABS: 54.0000 mg | ORAL_TABLET | Freq: Every day | ORAL | 1 refills | Status: DC

## 2021-01-23 MED ORDER — PANTOPRAZOLE SODIUM 40 MG PO TBEC: 40.0000 mg | DELAYED_RELEASE_TABLET | Freq: Every day | ORAL | 1 refills | Status: DC

## 2021-01-23 NOTE — Progress Notes (Signed)
Subjective:    Patient ID: Christian King, male    DOB: Jan 19, 1971, 50 y.o.   MRN: [id number]   Chief Complaint: medical management of chronic issues     HPI:  1. Essential hypertension No c/o chest pain, sob or headache. Does not check blood pressure at home. BP Readings from Last 3 Encounters:  05/31/20 126/80  05/25/20 125/74  02/08/20 136/83     2. Acquired hypothyroidism No problems that he is awareof. Lab Results  Component Value Date   TSH 3.890 02/08/2020     3. Mixed hyperlipidemia Dos not watch diet and does no dedicated exercise.  Lab Results  Component Value Date   CHOL 180 02/08/2020   HDL 39 (L) 02/08/2020   LDLCALC 40 02/08/2020   TRIG 730 (HH) 02/08/2020   CHOLHDL 4.6 02/08/2020     4. Hypokalemia No lower ext cramps.is on daily potassium supplement Lab Results  Component Value Date   K 5.2 05/31/2020     5. Hereditary hemochromatosis ([name]) Lab Results  Component Value Date   HCT 40.8 05/31/2020     6. History of DVT (deep vein thrombosis) Has no new occurrence. He is a Administrator.  7. Insomnia, unspecified type Sleeps well most nights.  8. Alcohol abuse Says he only drinks occasioanlly.   9. Vitamin B12 deficiency Lab Results  Component Value Date   VITAMINB12 69 (L) 05/23/2020       Outpatient Encounter Medications as of 01/23/2021  Medication Sig   benzonatate (TESSALON) 100 MG capsule Take 1 capsule (100 mg total) by mouth 2 (two) times daily as needed for cough.   cyanocobalamin (,VITAMIN B-12,) 1000 MCG/ML injection Inject 1 mL (1,000 mcg total) into the muscle every 30 (thirty) days.   fenofibrate 54 MG tablet Take 1 tablet by mouth once daily   folic acid (FOLVITE) 1 MG tablet Take 1 tablet (1 mg total) by mouth daily.   magnesium oxide (MAG-OX) 400 MG tablet Take 1 tablet (400 mg total) by mouth daily.   metoprolol succinate (TOPROL-XL) 50 MG 24 hr tablet TAKE 1 TABLET BY MOUTH ONCE DAILY WITH  OR   IMMEDIATELY  AFTER  A  MEAL   pantoprazole (PROTONIX) 40 MG tablet Take 1 tablet (40 mg total) by mouth daily at 6 (six) AM.   potassium chloride (KLOR-CON M10) 10 MEQ tablet TAKE 1 TABLET BY MOUTH EVERY DAY   sildenafil (REVATIO) 20 MG tablet TAKE TWO TO FIVE TABLETS BY MOUTH AS NEEDED FOR SEX   simvastatin (ZOCOR) 40 MG tablet Take 1 tablet (40 mg total) by mouth daily.   thiamine 100 MG tablet Take 1 tablet (100 mg total) by mouth daily.   vitamin B-12 (CYANOCOBALAMIN) 1000 MCG tablet Take 1 tablet (1,000 mcg total) by mouth daily.   No facility-administered encounter medications on file as of 01/23/2021.    Past Surgical History:  Procedure Laterality Date   TOTAL HIP ARTHROPLASTY Right    TOTAL HIP ARTHROPLASTY Left 03/16/2016   Procedure: TOTAL HIP ARTHROPLASTY;  Surgeon: Earlie Server, MD;  Location: [facility];  Service: Orthopedics;  Laterality: Left;   vascularized fibular graft     right hip for avascular necrosis    Family History  Problem Relation Age of Onset   Hypertension Father    Diabetes Maternal Grandmother    Hypertension Paternal Grandmother    Stroke Paternal Grandmother     New complaints: None today  Social history: Lives with his wife. Is currently  not working  Controlled substance contract: n/a     Review of Systems  Constitutional:  Negative for diaphoresis.  Eyes:  Negative for pain.  Respiratory:  Negative for shortness of breath.   Cardiovascular:  Negative for chest pain, palpitations and leg swelling.  Gastrointestinal:  Negative for abdominal pain.  Endocrine: Negative for polydipsia.  Skin:  Negative for rash.  Neurological:  Negative for dizziness, weakness and headaches.  Hematological:  Does not bruise/bleed easily.  All other systems reviewed and are negative.     Objective:   Physical Exam Vitals and nursing note reviewed.  Constitutional:      Appearance: Normal appearance. He is well-developed.  HENT:     Head:  Normocephalic.     Nose: Nose normal.  Eyes:     Pupils: Pupils are equal, round, and reactive to light.  Neck:     Thyroid: No thyroid mass or thyromegaly.     Vascular: No carotid bruit or JVD.     Trachea: Phonation normal.  Cardiovascular:     Rate and Rhythm: Normal rate and regular rhythm.  Pulmonary:     Effort: Pulmonary effort is normal. No respiratory distress.     Breath sounds: Normal breath sounds.  Abdominal:     General: Bowel sounds are normal.     Palpations: Abdomen is soft.     Tenderness: There is no abdominal tenderness.  Musculoskeletal:        General: Normal range of motion.     Cervical back: Normal range of motion and neck supple.  Lymphadenopathy:     Cervical: No cervical adenopathy.  Skin:    General: Skin is warm and dry.  Neurological:     Mental Status: He is alert and oriented to person, place, and time.  Psychiatric:        Behavior: Behavior normal.        Thought Content: Thought content normal.        Judgment: Judgment normal.    BP 123/84   Pulse 85   Temp 97.8 F (36.6 C) (Temporal)   Resp 20   Ht _0  (1.702 m)   Wt 171 lb (77.6 kg)   SpO2 96%   BMI 26.78 kg/m        Assessment & Plan:   Christian King comes in today with chief complaint of Medical Management of Chronic Issues   Diagnosis and orders addressed:  1. Essential hypertension Low sodium diet - metoprolol succinate (TOPROL-XL) 50 MG 24 hr tablet; Take with or immediately following a meal.  Dispense: 90 tablet; Refill: 1 - CBC with Differential/Platelet - CMP14+EGFR  2. Acquired hypothyroidism Labs pending - Thyroid Panel With TSH  3. Mixed hyperlipidemia Low fat diet - fenofibrate 54 MG tablet; Take 1 tablet (54 mg total) by mouth daily.  Dispense: 90 tablet; Refill: 1 - simvastatin (ZOCOR) 40 MG tablet; Take 1 tablet (40 mg total) by mouth daily.  Dispense: 90 tablet; Refill: 1 - Lipid panel  4. Hypokalemia Labs pending - potassium chloride  (KLOR-CON M10) 10 MEQ tablet; TAKE 1 TABLET BY MOUTH EVERY DAY  Dispense: 90 tablet; Refill: 1  5. Hereditary hemochromatosis ([name]) Labs oending  6. History of DVT (deep vein thrombosis)  7. Insomnia, unspecified type Bedtime routine  8. Alcohol abuse Discussed avoiding alcohol - pantoprazole (PROTONIX) 40 MG tablet; Take 1 tablet (40 mg total) by mouth daily at 6 (six) AM.  Dispense: 90 tablet; Refill: 1 - thiamine 100 MG  tablet; Take 1 tablet (100 mg total) by mouth daily.  Dispense: 90 tablet; Refill: 1  9. Vitamin B12 deficiency Continue vitamin b12   Labs pending Health Maintenance reviewed Diet and exercise encouraged  Follow up plan: 6 months   Mary-Margaret Hassell Done, FNP

## 2021-01-23 NOTE — Patient Instructions (Signed)
Alcohol Abuse and Dependence Information, Adult Alcohol is a widely available drug. People drink alcohol in different amounts. People who drink alcohol very often and in large amounts often have problems during and after drinking. They may develop what is called an alcohol use disorder. There are two main types of alcohol use disorders: Alcohol abuse. This is when you use alcohol too much or too often. You may use alcohol to make yourself feel happy or to reduce stress. You may have a hard time setting a limit on the amount you drink. Alcohol dependence. This is when you use alcohol consistently for a period of time, and your body changes as a result. This can make it hard to stop drinking because you may start to feel sick or feel different when you do not use alcohol. These symptoms are known as withdrawal. How can alcohol abuse and dependence affect me? Alcohol abuse and dependence can have a negative effect on your life. Drinking too much can lead to addiction. You may feel like you need alcohol to function normally. You may drink alcohol before work in the morning, during the day, or as soon as you get home from work in the evening. These actions can result in: Poor work performance. Job loss. Financial problems. Car crashes or criminal charges from driving after drinking alcohol. Problems in your relationships with friends and family. Losing the trust and respect of coworkers, friends, and family. Drinking heavily over a long period of time can permanently damage your body and brain, and can cause lifelong health issues, such as: Damage to your liver or pancreas. Heart problems, high blood pressure, or stroke. Certain cancers. Decreased ability to fight infections. Brain or nerve damage. Depression. Early (premature) death. If you are careless or you crave alcohol, it is easy to drink more than your body can handle (overdose). Alcohol overdose is a serious situation that requires  hospitalization. It may lead to permanent injuries or death. What can increase my risk? Having a family history of alcohol abuse. Having depression or other mental health conditions. Beginning to drink at an early age. Binge drinking often. Experiencing trauma, stress, and an unstable home life during childhood. Spending time with people who drink often. What actions can I take to prevent or manage alcohol abuse and dependence? Do not drink alcohol if: Your health care provider tells you not to drink. You are pregnant, may be pregnant, or are planning to become pregnant. If you drink alcohol: Limit how much you use to: 0-1 drink a day for women. 0-2 drinks a day for men. Be aware of how much alcohol is in your drink. In the [place] one drink equals one 12 oz bottle of beer (355 mL), one 5 oz glass of wine (148 mL), or one 1 oz glass of hard liquor (44 mL). Stop drinking if you have been drinking too much. This can be very hard to do if you are used to abusing alcohol. If you begin to have withdrawal symptoms, talk with your health care provider or a person that you trust. These symptoms may include anxiety, shaky hands, headache, nausea, sweating, or not being able to sleep. Choose to drink nonalcoholic beverages in social gatherings and places where there may be alcohol. Activity Spend more time on activities that you enjoy that do not involve alcohol, like hobbies or exercise. Find healthy ways to cope with stress, such as exercise, meditation, or spending time with people you care about. General information Talk to your family,   coworkers, and friends about supporting you in your efforts to stop drinking. If they drink, ask them not to drink around you. Spend more time with people who do not drink alcohol. If you think that you have an alcohol dependency problem: Tell friends or family about your concerns. Talk with your health care provider or another health professional about where to  get help. Work with a therapist and a chemical dependency counselor. Consider joining a support group for people who struggle with alcohol abuse and dependence. Where to find support  Your health care provider. SMART Recovery: www.smartrecovery.org Therapy and support groups Local treatment centers or chemical dependency counselors. Local AA groups in your community: www.aa.org Where to find more information Centers for Disease Control and Prevention: www.cdc.gov National Institute on Alcohol Abuse and Alcoholism: www.niaaa.nih.gov Alcoholics Anonymous (AA): www.aa.org Contact a health care provider if: You drank more or for longer than you intended on more than one occasion. You tried to stop drinking or to cut back on how much you drink, but you were not able to. You often drink to the point of vomiting or passing out. You want to drink so badly that you cannot think about anything else. You have problems in your life due to drinking, but you continue to drink. You keep drinking even though you feel anxious, depressed, or have experienced memory loss. You have stopped doing the things you used to enjoy in order to drink. You have to drink more than you used to in order to get the effect you want. You experience anxiety, sweating, nausea, shakiness, and trouble sleeping when you try to stop drinking. Get help right away if: You have thoughts about hurting yourself or others. You have serious withdrawal symptoms, including: Confusion. Racing heart. High blood pressure. Fever. If you ever feel like you may hurt yourself or others, or have thoughts about taking your own life, get help right away. You can go to your nearest emergency department or call: Your local emergency services (911 in the [place] A suicide crisis helpline, such as the National Suicide Prevention Lifeline at 1-800-273-8255 or 988 in the [place] This is open 24 hours a day. Summary Alcohol abuse and dependence can  have a negative effect on your life. Drinking too much or too often can lead to addiction. If you drink alcohol, limit how much you use. If you are having trouble keeping your drinking under control, find ways to change your behavior. Hobbies, calming activities, exercise, or support groups can help. If you feel you need help with changing your drinking habits, talk with your health care provider, a good friend, or a therapist, or go to an AA group. This information is not intended to replace advice given to you by your health care provider. Make sure you discuss any questions you have with your health care provider. Document Revised: 08/31/2020 Document Reviewed: 04/15/2018 Elsevier Patient Education  2022 Elsevier Inc.  

## 2021-01-23 NOTE — Addendum Note (Signed)
Addended by: Bennie Pierini on: 01/23/2021 12:19 PM   Modules accepted: Orders

## 2021-01-24 LAB — THYROID PANEL WITH TSH
Free Thyroxine Index: 2 (ref 1.2–4.9)
T3 Uptake Ratio: 26 % (ref 24–39)
T4, Total: 7.8 ug/dL (ref 4.5–12.0)
TSH: 2.2 u[IU]/mL (ref 0.450–4.500)

## 2021-01-24 LAB — CMP14+EGFR
ALT: 21 IU/L (ref 0–44)
AST: 21 IU/L (ref 0–40)
Albumin/Globulin Ratio: 1.8 (ref 1.2–2.2)
Albumin: 4.8 g/dL (ref 3.8–4.9)
Alkaline Phosphatase: 69 IU/L (ref 44–121)
BUN/Creatinine Ratio: 13 (ref 9–20)
BUN: 11 mg/dL (ref 6–24)
Bilirubin Total: 0.4 mg/dL (ref 0.0–1.2)
CO2: 23 mmol/L (ref 20–29)
Calcium: 9.7 mg/dL (ref 8.7–10.2)
Chloride: 99 mmol/L (ref 96–106)
Creatinine, Ser: 0.83 mg/dL (ref 0.76–1.27)
Globulin, Total: 2.6 g/dL (ref 1.5–4.5)
Glucose: 105 mg/dL — ABNORMAL HIGH (ref 70–99)
Potassium: 4.2 mmol/L (ref 3.5–5.2)
Sodium: 138 mmol/L (ref 134–144)
Total Protein: 7.4 g/dL (ref 6.0–8.5)
eGFR: 105 mL/min/{1.73_m2} (ref >59)

## 2021-01-24 LAB — CBC WITH DIFFERENTIAL/PLATELET
Basophils Absolute: 0 10*3/uL (ref 0.0–0.2)
Basos: 1 %
EOS (ABSOLUTE): 0 10*3/uL (ref 0.0–0.4)
Eos: 0 %
Hematocrit: 44.9 % (ref 37.5–51.0)
Hemoglobin: 15.1 g/dL (ref 13.0–17.7)
Immature Grans (Abs): 0 10*3/uL (ref 0.0–0.1)
Immature Granulocytes: 0 %
Lymphocytes Absolute: 1.1 10*3/uL (ref 0.7–3.1)
Lymphs: 19 %
MCH: 32.8 pg (ref 26.6–33.0)
MCHC: 33.6 g/dL (ref 31.5–35.7)
MCV: 97 fL (ref 79–97)
Monocytes Absolute: 0.6 10*3/uL (ref 0.1–0.9)
Monocytes: 10 %
Neutrophils Absolute: 4.1 10*3/uL (ref 1.4–7.0)
Neutrophils: 70 %
Platelets: 202 10*3/uL (ref 150–450)
RBC: 4.61 x10E6/uL (ref 4.14–5.80)
RDW: 13.4 % (ref 11.6–15.4)
WBC: 5.8 10*3/uL (ref 3.4–10.8)

## 2021-01-24 LAB — LIPID PANEL
Chol/HDL Ratio: 2.5 ratio (ref 0.0–5.0)
Cholesterol, Total: 163 mg/dL (ref 100–199)
HDL: 66 mg/dL (ref >39)
LDL Chol Calc (NIH): 85 mg/dL (ref 0–99)
Triglycerides: 60 mg/dL (ref 0–149)
VLDL Cholesterol Cal: 12 mg/dL (ref 5–40)

## 2021-01-30 ENCOUNTER — Telehealth: Payer: Self-pay | Admitting: Nurse Practitioner

## 2021-01-31 NOTE — Telephone Encounter (Signed)
Attempted to contact patient but unable to leave message because voicemail was full

## 2021-02-01 NOTE — Telephone Encounter (Signed)
Spoke with patient and he states that he was notified yesterday with results

## 2021-06-30 ENCOUNTER — Other Ambulatory Visit: Payer: Self-pay | Admitting: *Deleted

## 2021-06-30 MED ORDER — MAGNESIUM OXIDE 400 MG PO TABS: 400.0000 mg | ORAL_TABLET | Freq: Every day | ORAL | 0 refills | Status: DC

## 2021-07-24 ENCOUNTER — Ambulatory Visit: Payer: 59 | Admitting: Nurse Practitioner

## 2021-07-24 ENCOUNTER — Ambulatory Visit: Payer: 59

## 2021-09-21 ENCOUNTER — Other Ambulatory Visit: Payer: Self-pay | Admitting: Nurse Practitioner

## 2021-10-11 ENCOUNTER — Other Ambulatory Visit: Payer: Self-pay | Admitting: Nurse Practitioner

## 2021-10-11 DIAGNOSIS — I10 Essential (primary) hypertension: Secondary | ICD-10-CM

## 2021-10-13 NOTE — Telephone Encounter (Signed)
Called and left message for patient to call the office and schedule a med refill appt with his PCP.

## 2021-11-13 ENCOUNTER — Encounter: Payer: Self-pay | Admitting: Nurse Practitioner

## 2021-11-13 ENCOUNTER — Ambulatory Visit (INDEPENDENT_AMBULATORY_CARE_PROVIDER_SITE_OTHER): Payer: Self-pay | Admitting: Nurse Practitioner

## 2021-11-13 VITALS — BP 121/78 | HR 77 | Temp 97.7°F | Ht 67.0 in | Wt 166.6 lb

## 2021-11-13 DIAGNOSIS — E876 Hypokalemia: Secondary | ICD-10-CM

## 2021-11-13 DIAGNOSIS — I10 Essential (primary) hypertension: Secondary | ICD-10-CM

## 2021-11-13 DIAGNOSIS — E538 Deficiency of other specified B group vitamins: Secondary | ICD-10-CM

## 2021-11-13 DIAGNOSIS — E782 Mixed hyperlipidemia: Secondary | ICD-10-CM

## 2021-11-13 DIAGNOSIS — F1010 Alcohol abuse, uncomplicated: Secondary | ICD-10-CM

## 2021-11-13 DIAGNOSIS — E039 Hypothyroidism, unspecified: Secondary | ICD-10-CM

## 2021-11-13 DIAGNOSIS — G4700 Insomnia, unspecified: Secondary | ICD-10-CM

## 2021-11-13 DIAGNOSIS — M1A9XX0 Chronic gout, unspecified, without tophus (tophi): Secondary | ICD-10-CM

## 2021-11-13 DIAGNOSIS — Z6825 Body mass index (BMI) 25.0-25.9, adult: Secondary | ICD-10-CM

## 2021-11-13 MED ORDER — SIMVASTATIN 40 MG PO TABS: 40.0000 mg | ORAL_TABLET | Freq: Every day | ORAL | 1 refills | Status: DC

## 2021-11-13 MED ORDER — LORAZEPAM 2 MG PO TABS: ORAL_TABLET | ORAL | 0 refills | Status: DC

## 2021-11-13 MED ORDER — METOPROLOL SUCCINATE ER 50 MG PO TB24: ORAL_TABLET | ORAL | 0 refills | Status: DC

## 2021-11-13 MED ORDER — POTASSIUM CHLORIDE CRYS ER 10 MEQ PO TBCR: EXTENDED_RELEASE_TABLET | ORAL | 1 refills | Status: DC

## 2021-11-13 MED ORDER — PANTOPRAZOLE SODIUM 40 MG PO TBEC: 40.0000 mg | DELAYED_RELEASE_TABLET | Freq: Every day | ORAL | 1 refills | Status: DC

## 2021-11-13 MED ORDER — MAGNESIUM OXIDE 400 MG PO TABS: 400.0000 mg | ORAL_TABLET | Freq: Every day | ORAL | 1 refills | Status: DC

## 2021-11-13 MED ORDER — FENOFIBRATE 54 MG PO TABS: 54.0000 mg | ORAL_TABLET | Freq: Every day | ORAL | 1 refills | Status: DC

## 2021-11-13 NOTE — Patient Instructions (Signed)
Hemochromatosis Hemochromatosis is a condition in which the body stores too much iron. This is also called iron storage disease or iron overload disorder. The extra iron builds up in your joints, heart, liver, pancreas, and other organs, where it can cause damage. There are two forms of this condition; they include: Hereditary hemochromatosis. Defects (mutations) on certain genes can cause symptoms to develop. With this type of the condition, the body absorbs more iron than it needs from the foods you eat. Secondary hemochromatosis. With this type of the condition, iron builds up in the body due to other reasons, such as from liver disease or blood transfusions. What are the causes? This condition may be caused by: Abnormal genes passed down from both parents (inherited). Receiving blood from a donor (blood transfusion). Problems with the way the body uses iron in the bone marrow (ineffective erythropoiesis). Having chronic liver disease, such as hepatitis or liver cancer. What increases the risk? You are more likely to develop this condition if you: Inherit certain abnormal gene mutations from both parents. Are white (Caucasian). Have severe or long-term (chronic) anemia. What are the signs or symptoms? Signs and symptoms can start at any age, but they usually start in middle age. They may include: Fatigue. Weakness. Joint pain and stiffness. Abdominal pain. Weight loss. Skin turning a gray or bronze color. Loss of interest in sex. Loss of menstrual periods, in women. Loss of body hair. Shortness of breath. As hemochromatosis gets worse, it may damage the liver, heart, or pancreas. This may lead to complications such as: Diabetes. Liver cancer. Abnormal heart rhythms. Heart failure. How is this diagnosed? This condition may be diagnosed based on: Your symptoms and medical history. A physical exam. Blood tests. Genetic testing. Removal and testing of a sample of liver tissue  (liver biopsy). How is this treated? This condition is most often treated with: Therapeutic phlebotomy. In this procedure, some of your blood is removed periodically to reduce the amount of iron in your body. Over time, your body will naturally replace the blood cells that you lose during phlebotomy. At the start of treatment, you may have a unit of blood removed once or twice a week. You will have blood tests during this time to determine when your iron levels return to normal. Once your iron levels are normal, you may only need to have a phlebotomy every few months. Lifestyle changes. This may include not drinking alcohol and limiting certain items in your diet. Medicines to remove excess iron (chelation therapy). Medicines to help treat any related conditions. Genetic counseling. If you are found to have a gene mutation, other family members may need to be tested for hereditary hemochromatosis. Follow these instructions at home: Alcohol use  If you have liver damage, do not drink alcohol. If you do not have liver damage: Do not drink alcohol if: Your health care provider tells you not to drink. You are pregnant, may be pregnant, or are planning to become pregnant. If you drink alcohol: Limit how much you have to: 0-1 drink a day for women. 0-2 drinks a day for men. Know how much alcohol is in your drink. In the [place] one drink equals one 12 oz bottle of beer (355 mL), one 5 oz glass of wine (148 mL), or one 1 oz glass of hard liquor (44 mL). General instructions Stay active. Exercise for at least 30 minutes on most days of the week. Take over-the-counter and prescription medicines only as told by your health care   provider. This includes vitamins and supplements. You may need to have frequent blood or urine testing to monitor your condition and to look for complications. Follow any instructions as directed by your health care provider regarding dietary restrictions. Do not: Take  vitamins or supplements that contain iron. Take vitamin C supplements. Vitamin C makes your body absorb more iron from foods. Eat raw shellfish or raw fish. Hemochromatosis may increase your chance of infection from these foods. Keep all follow-up visits. This is important. Contact a health care provider if: You have fatigue. You have unusual weakness. You have shortness of breath. You have joint pain. You have abdominal pain. You have weight loss. Get help right away if: You have chest pain. You have trouble breathing. These symptoms may represent a serious problem that is an emergency. Do not wait to see if the symptoms will go away. Get medical help right away. Call your local emergency services (911 in the [place] Do not drive yourself to the hospital. Summary Hemochromatosis is a condition in which your body stores too much iron. This is also called iron storage disease or iron overload disorder. The extra iron builds up in your joints, heart, liver, pancreas, and other organs, where it can cause damage. Signs and symptoms can start at any age, but they usually start in middle age. To treat this condition, you will need to have some of your blood removed periodically (therapeutic phlebotomy). Removing some of your blood also removes iron from your body. You may need to have frequent blood or urine testing to monitor your condition and to look for complications. This information is not intended to replace advice given to you by your health care provider. Make sure you discuss any questions you have with your health care provider. Document Revised: 08/01/2020 Document Reviewed: 08/01/2020 Elsevier Patient Education  2023 Elsevier Inc.  

## 2021-11-13 NOTE — Progress Notes (Signed)
Subjective:    Patient ID: Christian King, male    DOB: [birth year], 51 y.o.   MRN: [id number]   Chief Complaint: Medical Management of Chronic Issues    HPI:  Christian King is a 51 y.o. who identifies as a male who was assigned male at birth.   Social history: Lives with: wife Work history: drives a truck   Comes in today for follow up of the following chronic medical issues:  1. Essential hypertension No c/o chest pain, sob or headache. Does not check blood pressure today. BP Readings from Last 3 Encounters:  11/13/21 121/78  01/23/21 123/84  05/31/20 126/80     2. Mixed hyperlipidemia Does not watch  diet and does no dedicated exercise. Lab Results  Component Value Date   CHOL 163 01/23/2021   HDL 66 01/23/2021   LDLCALC 85 01/23/2021   TRIG 60 01/23/2021   CHOLHDL 2.5 01/23/2021     3. Acquired hypothyroidism No problems that he is aware of. Lab Results  Component Value Date   TSH 2.200 01/23/2021     4. Hypokalemia No muscle cramps Lab Results  Component Value Date   K 4.2 01/23/2021     5. Chronic gout without tophus, unspecified cause, unspecified site No recent flare  ups  6. Alcohol abuse Says he only drinks occasionally  7. Hereditary hemochromatosis ([place]) Last ferritin was 293  8. Vitamin B12 deficiency Is on oral b12 daily Lab Results  Component Value Date   VITAMINB12 69 (L) 05/23/2020     9. Insomnia, unspecified type Sleeping better  10. BMI 25.0-25.9,adult No recent weight changes Wt Readings from Last 3 Encounters:  11/13/21 166 lb 9.6 oz (75.6 kg)  01/23/21 171 lb (77.6 kg)  05/31/20 174 lb (78.9 kg)   BMI Readings from Last 3 Encounters:  11/13/21 26.09 kg/m  01/23/21 26.78 kg/m  05/31/20 27.25 kg/m      New complaints: None today  Allergies  Allergen Reactions   Amoxicillin Hives, Itching and Rash   Azithromycin Other (See Comments)    History of QT prolongation   Hctz  [Hydrochlorothiazide] Rash   Tramadol Itching   Outpatient Encounter Medications as of 11/13/2021  Medication Sig   cyanocobalamin (,VITAMIN B-12,) 1000 MCG/ML injection Inject 1 mL (1,000 mcg total) into the muscle every 30 (thirty) days.   cyanocobalamin (VITAMIN B12) 1000 MCG tablet Take 1 tablet by mouth once daily   fenofibrate 54 MG tablet Take 1 tablet (54 mg total) by mouth daily.   LORazepam (ATIVAN) 2 MG tablet 1 po prn   magnesium oxide (MAG-OX) 400 MG tablet Take 1 tablet (400 mg total) by mouth daily.   metoprolol succinate (TOPROL-XL) 50 MG 24 hr tablet TAKE 1 TABLET BY MOUTH ONCE DAILY WITH OR IMMEDIATELY FOLLOWING A MEAL   pantoprazole (PROTONIX) 40 MG tablet Take 1 tablet (40 mg total) by mouth daily at 6 (six) AM.   potassium chloride (KLOR-CON M10) 10 MEQ tablet TAKE 1 TABLET BY MOUTH EVERY DAY   sildenafil (REVATIO) 20 MG tablet TAKE TWO TO FIVE TABLETS BY MOUTH AS NEEDED FOR SEX   simvastatin (ZOCOR) 40 MG tablet Take 1 tablet (40 mg total) by mouth daily.   thiamine 100 MG tablet Take 1 tablet (100 mg total) by mouth daily.   [DISCONTINUED] benzonatate (TESSALON) 100 MG capsule Take 1 capsule (100 mg total) by mouth 2 (two) times daily as needed for cough.   No facility-administered encounter medications on file  as of 11/13/2021.    Past Surgical History:  Procedure Laterality Date   TOTAL HIP ARTHROPLASTY Right    TOTAL HIP ARTHROPLASTY Left 03/16/2016   Procedure: TOTAL HIP ARTHROPLASTY;  Surgeon: Earlie Server, MD;  Location: [facility];  Service: Orthopedics;  Laterality: Left;   vascularized fibular graft     right hip for avascular necrosis    Family History  Problem Relation Age of Onset   Hypertension Father    Diabetes Maternal Grandmother    Hypertension Paternal Grandmother    Stroke Paternal Grandmother       Controlled substance contract: n/a     Review of Systems  Constitutional:  Negative for diaphoresis.  Eyes:  Negative for pain.   Respiratory:  Negative for shortness of breath.   Cardiovascular:  Negative for chest pain, palpitations and leg swelling.  Gastrointestinal:  Negative for abdominal pain.  Endocrine: Negative for polydipsia.  Skin:  Negative for rash.  Neurological:  Negative for dizziness, weakness and headaches.  Hematological:  Does not bruise/bleed easily.  All other systems reviewed and are negative.      Objective:   Physical Exam Vitals and nursing note reviewed.  Constitutional:      Appearance: Normal appearance. He is well-developed.  HENT:     Head: Normocephalic.     Nose: Nose normal.     Mouth/Throat:     Mouth: Mucous membranes are moist.     Pharynx: Oropharynx is clear.  Eyes:     Pupils: Pupils are equal, round, and reactive to light.  Neck:     Thyroid: No thyroid mass or thyromegaly.     Vascular: No carotid bruit or JVD.     Trachea: Phonation normal.  Cardiovascular:     Rate and Rhythm: Normal rate and regular rhythm.  Pulmonary:     Effort: Pulmonary effort is normal. No respiratory distress.     Breath sounds: Normal breath sounds.  Abdominal:     General: Bowel sounds are normal.     Palpations: Abdomen is soft.     Tenderness: There is no abdominal tenderness.  Musculoskeletal:        General: Normal range of motion.     Cervical back: Normal range of motion and neck supple.  Lymphadenopathy:     Cervical: No cervical adenopathy.  Skin:    General: Skin is warm and dry.  Neurological:     Mental Status: He is alert and oriented to person, place, and time.  Psychiatric:        Behavior: Behavior normal.        Thought Content: Thought content normal.        Judgment: Judgment normal.    BP 121/78   Pulse 77   Temp 97.7 F (36.5 C) (Temporal)   Ht '5\' 7"'  (1.702 m)   Wt 166 lb 9.6 oz (75.6 kg)   SpO2 94%   BMI 26.09 kg/m         Assessment & Plan:  Christian King comes in today with chief complaint of Medical Management of Chronic  Issues   Diagnosis and orders addressed:  1. Essential hypertension Low sodium diet - CBC with Differential/Platelet - CMP14+EGFR - metoprolol succinate (TOPROL-XL) 50 MG 24 hr tablet; TAKE 1 TABLET BY MOUTH ONCE DAILY WITH OR IMMEDIATELY FOLLOWING A MEAL  Dispense: 30 tablet; Refill: 0  2. Mixed hyperlipidemia Low fat diet - Lipid panel - fenofibrate 54 MG tablet; Take 1 tablet (54 mg total)  by mouth daily.  Dispense: 90 tablet; Refill: 1 - simvastatin (ZOCOR) 40 MG tablet; Take 1 tablet (40 mg total) by mouth daily.  Dispense: 90 tablet; Refill: 1  3. Acquired hypothyroidism Labs pending  4. Hypokalemia Labs pending - potassium chloride (KLOR-CON M10) 10 MEQ tablet; TAKE 1 TABLET BY MOUTH EVERY DAY  Dispense: 90 tablet; Refill: 1  5. Chronic gout without tophus, unspecified cause, unspecified site Watch diet to prevent flare up  6. Alcohol abuse Avoid alcohol - pantoprazole (PROTONIX) 40 MG tablet; Take 1 tablet (40 mg total) by mouth daily at 6 (six) AM.  Dispense: 90 tablet; Refill: 1  7. Hereditary hemochromatosis ([place]) Labs pending - Ferritin  8. Vitamin B12 deficiency Labs pending - Vitamin B12  9. Insomnia, unspecified type Bedtime routine - LORazepam (ATIVAN) 2 MG tablet; 1 po prn  Dispense: 15 tablet; Refill: 0  10. BMI 25.0-25.9,adult Discussed diet and exercise for person with BMI >25 Will recheck weight in 3-6 months    Labs pending Health Maintenance reviewed Diet and exercise encouraged  Follow up plan: 6 months   Mary-Margaret Hassell Done, FNP

## 2021-11-14 ENCOUNTER — Telehealth: Payer: Self-pay | Admitting: Nurse Practitioner

## 2021-11-14 LAB — CMP14+EGFR
ALT: 14 IU/L (ref 0–44)
AST: 22 IU/L (ref 0–40)
Albumin/Globulin Ratio: 1.9 (ref 1.2–2.2)
Albumin: 4.4 g/dL (ref 3.8–4.9)
Alkaline Phosphatase: 55 IU/L (ref 44–121)
BUN/Creatinine Ratio: 18 (ref 9–20)
BUN: 16 mg/dL (ref 6–24)
Bilirubin Total: 0.3 mg/dL (ref 0.0–1.2)
CO2: 23 mmol/L (ref 20–29)
Calcium: 9 mg/dL (ref 8.7–10.2)
Chloride: 104 mmol/L (ref 96–106)
Creatinine, Ser: 0.88 mg/dL (ref 0.76–1.27)
Globulin, Total: 2.3 g/dL (ref 1.5–4.5)
Glucose: 99 mg/dL (ref 70–99)
Potassium: 3.6 mmol/L (ref 3.5–5.2)
Sodium: 145 mmol/L — ABNORMAL HIGH (ref 134–144)
Total Protein: 6.7 g/dL (ref 6.0–8.5)
eGFR: 103 mL/min/{1.73_m2} (ref >59)

## 2021-11-14 LAB — CBC WITH DIFFERENTIAL/PLATELET
Basophils Absolute: 0.1 10*3/uL (ref 0.0–0.2)
Basos: 1 %
EOS (ABSOLUTE): 0.1 10*3/uL (ref 0.0–0.4)
Eos: 2 %
Hematocrit: 39.4 % (ref 37.5–51.0)
Hemoglobin: 13.4 g/dL (ref 13.0–17.7)
Immature Grans (Abs): 0 10*3/uL (ref 0.0–0.1)
Immature Granulocytes: 0 %
Lymphocytes Absolute: 1.7 10*3/uL (ref 0.7–3.1)
Lymphs: 40 %
MCH: 33.7 pg — ABNORMAL HIGH (ref 26.6–33.0)
MCHC: 34 g/dL (ref 31.5–35.7)
MCV: 99 fL — ABNORMAL HIGH (ref 79–97)
Monocytes Absolute: 0.6 10*3/uL (ref 0.1–0.9)
Monocytes: 13 %
Neutrophils Absolute: 1.9 10*3/uL (ref 1.4–7.0)
Neutrophils: 44 %
Platelets: 237 10*3/uL (ref 150–450)
RBC: 3.98 x10E6/uL — ABNORMAL LOW (ref 4.14–5.80)
RDW: 12.5 % (ref 11.6–15.4)
WBC: 4.2 10*3/uL (ref 3.4–10.8)

## 2021-11-14 LAB — LIPID PANEL
Chol/HDL Ratio: 2.4 ratio (ref 0.0–5.0)
Cholesterol, Total: 111 mg/dL (ref 100–199)
HDL: 46 mg/dL (ref >39)
LDL Chol Calc (NIH): 32 mg/dL (ref 0–99)
Triglycerides: 209 mg/dL — ABNORMAL HIGH (ref 0–149)
VLDL Cholesterol Cal: 33 mg/dL (ref 5–40)

## 2021-11-14 LAB — VITAMIN B12: Vitamin B-12: 427 pg/mL (ref 232–1245)

## 2021-11-14 LAB — FERRITIN: Ferritin: 441 ng/mL — ABNORMAL HIGH (ref 30–400)

## 2021-11-14 MED ORDER — AMITRIPTYLINE HCL 150 MG PO TABS: 150.0000 mg | ORAL_TABLET | Freq: Every day | ORAL | 0 refills | Status: DC

## 2021-11-14 NOTE — Telephone Encounter (Signed)
Never heard of using amitryptinline for shingles in eye but will send in as suggested. Meds ordered this encounter  Medications   amitriptyline (ELAVIL) 150 MG tablet    Sig: Take 1 tablet (150 mg total) by mouth at bedtime.    Dispense:  20 tablet    Refill:  0    Order Specific Question:   Supervising Provider    Answer:   Worthy Rancher [[id number]]   [place], FNP

## 2021-12-23 ENCOUNTER — Other Ambulatory Visit: Payer: Self-pay | Admitting: Nurse Practitioner

## 2021-12-23 DIAGNOSIS — I10 Essential (primary) hypertension: Secondary | ICD-10-CM

## 2021-12-29 ENCOUNTER — Telehealth: Payer: Self-pay | Admitting: Nurse Practitioner

## 2021-12-29 NOTE — Telephone Encounter (Signed)
Pt called requesting to speak with nurse regarding his most recent labs and questions about shingles.

## 2021-12-29 NOTE — Telephone Encounter (Signed)
Reviewed labs from last visit with patient and he verbalized understanding.

## 2022-01-08 ENCOUNTER — Ambulatory Visit: Payer: Self-pay

## 2022-02-13 ENCOUNTER — Encounter: Payer: Self-pay | Admitting: Family

## 2022-02-13 ENCOUNTER — Ambulatory Visit (INDEPENDENT_AMBULATORY_CARE_PROVIDER_SITE_OTHER): Payer: Self-pay | Admitting: Family

## 2022-02-13 VITALS — BP 137/85 | HR 110 | Temp 98.1°F | Ht 67.0 in | Wt 170.6 lb

## 2022-02-13 DIAGNOSIS — R6889 Other general symptoms and signs: Secondary | ICD-10-CM

## 2022-02-13 NOTE — Patient Instructions (Signed)

## 2022-02-13 NOTE — Progress Notes (Signed)
   Subjective:    Patient ID: Christian King, male    DOB: 1970/11/24, 51 y.o.   MRN: [id number]  Chief Complaint  Patient presents with   Generalized Body Aches    Over a week but no better    HPI Pt presents to the office today for body aches, nasal congestion, left ear pain last week. He is better now, but reports he needs a note to return to work. He took Motrin with moderate relief.    Review of Systems  All other systems reviewed and are negative.      Objective:   Physical Exam Vitals reviewed.  Constitutional:      General: He is not in acute distress.    Appearance: He is well-developed.  HENT:     Head: Normocephalic.  Eyes:     General:        Right eye: No discharge.        Left eye: No discharge.     Pupils: Pupils are equal, round, and reactive to light.  Neck:     Thyroid: No thyromegaly.  Cardiovascular:     Rate and Rhythm: Normal rate and regular rhythm.     Heart sounds: Normal heart sounds. No murmur heard. Pulmonary:     Effort: Pulmonary effort is normal. No respiratory distress.     Breath sounds: Normal breath sounds. No wheezing.  Abdominal:     General: Bowel sounds are normal. There is no distension.     Palpations: Abdomen is soft.     Tenderness: There is no abdominal tenderness.  Musculoskeletal:        General: No tenderness. Normal range of motion.     Cervical back: Normal range of motion and neck supple.  Skin:    General: Skin is warm and dry.     Findings: No erythema or rash.  Neurological:     Mental Status: He is alert and oriented to person, place, and time.     Cranial Nerves: No cranial nerve deficit.     Deep Tendon Reflexes: Reflexes are normal and symmetric.  Psychiatric:        Behavior: Behavior normal.        Thought Content: Thought content normal.        Judgment: Judgment normal.     BP 137/85   Pulse (!) 110   Temp 98.1 F (36.7 C) (Temporal)   Ht 5\' 7"  (1.702 m)   Wt 170 lb 9.6 oz (77.4 kg)    SpO2 95%   BMI 26.72 kg/m        Assessment & Plan:  COSTON MANDATO comes in today with chief complaint of Generalized Body Aches (Over a week but no better)   Diagnosis and orders addressed:  1. Flu-like symptoms Rest Force fluids Tylenol and motrin  Work note given  Symptoms resolved Follow up as needed    [name], FNP

## 2022-04-20 ENCOUNTER — Telehealth: Payer: Self-pay | Admitting: Nurse Practitioner

## 2022-04-20 DIAGNOSIS — F1010 Alcohol abuse, uncomplicated: Secondary | ICD-10-CM

## 2022-04-20 MED ORDER — THIAMINE HCL 100 MG PO TABS: 100.0000 mg | ORAL_TABLET | Freq: Every day | ORAL | 0 refills | Status: DC

## 2022-04-20 NOTE — Telephone Encounter (Signed)
  Prescription Request  04/20/2022  Is this a "Controlled Substance" medicine? NO  Have you seen your PCP in the last 2 weeks? No pt has appt in march with MMM  If YES, route message to pool  -  If NO, patient needs to be scheduled for appointment.  What is the name of the medication or equipment? folic acid (FOLVITE) 1 MG tablet   Have you contacted your pharmacy to request a refill? yes   Which pharmacy would you like this sent to? [facility]    Patient notified that their request is being sent to the clinical staff for review and that they should receive a response within 2 business days.

## 2022-04-27 ENCOUNTER — Encounter: Payer: Self-pay | Admitting: Nurse Practitioner

## 2022-04-27 ENCOUNTER — Ambulatory Visit (INDEPENDENT_AMBULATORY_CARE_PROVIDER_SITE_OTHER): Payer: BC Managed Care – PPO | Admitting: Nurse Practitioner

## 2022-04-27 ENCOUNTER — Ambulatory Visit (INDEPENDENT_AMBULATORY_CARE_PROVIDER_SITE_OTHER): Payer: BC Managed Care – PPO

## 2022-04-27 VITALS — BP 114/79 | HR 79 | Temp 97.7°F | Resp 20 | Ht 67.0 in | Wt 168.0 lb

## 2022-04-27 DIAGNOSIS — F1010 Alcohol abuse, uncomplicated: Secondary | ICD-10-CM

## 2022-04-27 DIAGNOSIS — Z125 Encounter for screening for malignant neoplasm of prostate: Secondary | ICD-10-CM | POA: Diagnosis not present

## 2022-04-27 DIAGNOSIS — M1A9XX0 Chronic gout, unspecified, without tophus (tophi): Secondary | ICD-10-CM

## 2022-04-27 DIAGNOSIS — E782 Mixed hyperlipidemia: Secondary | ICD-10-CM | POA: Diagnosis not present

## 2022-04-27 DIAGNOSIS — E538 Deficiency of other specified B group vitamins: Secondary | ICD-10-CM | POA: Diagnosis not present

## 2022-04-27 DIAGNOSIS — E039 Hypothyroidism, unspecified: Secondary | ICD-10-CM | POA: Diagnosis not present

## 2022-04-27 DIAGNOSIS — Z0001 Encounter for general adult medical examination with abnormal findings: Secondary | ICD-10-CM | POA: Diagnosis not present

## 2022-04-27 DIAGNOSIS — E876 Hypokalemia: Secondary | ICD-10-CM

## 2022-04-27 DIAGNOSIS — I10 Essential (primary) hypertension: Secondary | ICD-10-CM

## 2022-04-27 DIAGNOSIS — G4700 Insomnia, unspecified: Secondary | ICD-10-CM

## 2022-04-27 DIAGNOSIS — Z0389 Encounter for observation for other suspected diseases and conditions ruled out: Secondary | ICD-10-CM | POA: Diagnosis not present

## 2022-04-27 DIAGNOSIS — Z0000 Encounter for general adult medical examination without abnormal findings: Secondary | ICD-10-CM

## 2022-04-27 DIAGNOSIS — Z6825 Body mass index (BMI) 25.0-25.9, adult: Secondary | ICD-10-CM

## 2022-04-27 MED ORDER — PANTOPRAZOLE SODIUM 40 MG PO TBEC: 40.0000 mg | DELAYED_RELEASE_TABLET | Freq: Every day | ORAL | 1 refills | Status: DC

## 2022-04-27 MED ORDER — METOPROLOL SUCCINATE ER 50 MG PO TB24: ORAL_TABLET | ORAL | 1 refills | Status: DC

## 2022-04-27 MED ORDER — POTASSIUM CHLORIDE CRYS ER 10 MEQ PO TBCR: EXTENDED_RELEASE_TABLET | ORAL | 1 refills | Status: DC

## 2022-04-27 MED ORDER — FENOFIBRATE 54 MG PO TABS: 54.0000 mg | ORAL_TABLET | Freq: Every day | ORAL | 1 refills | Status: DC

## 2022-04-27 MED ORDER — SIMVASTATIN 40 MG PO TABS: 40.0000 mg | ORAL_TABLET | Freq: Every day | ORAL | 1 refills | Status: DC

## 2022-04-27 NOTE — Patient Instructions (Signed)
Hemochromatosis Hemochromatosis is a condition in which the body stores too much iron. This is also called iron storage disease or iron overload disorder. The extra iron builds up in your joints, heart, liver, pancreas, and other organs, where it can cause damage. There are two forms of this condition; they include: Hereditary hemochromatosis. Defects (mutations) on certain genes can cause symptoms to develop. With this type of the condition, the body absorbs more iron than it needs from the foods you eat. Secondary hemochromatosis. With this type of the condition, iron builds up in the body due to other reasons, such as from liver disease or blood transfusions. What are the causes? This condition may be caused by: Abnormal genes passed down from both parents (inherited). Receiving blood from a donor (blood transfusion). Problems with the way the body uses iron in the bone marrow (ineffective erythropoiesis). Having chronic liver disease, such as hepatitis or liver cancer. What increases the risk? You are more likely to develop this condition if you: Inherit certain abnormal gene mutations from both parents. Are white (Caucasian). Have severe or long-term (chronic) anemia. What are the signs or symptoms? Signs and symptoms can start at any age, but they usually start in middle age. They may include: Fatigue. Weakness. Joint pain and stiffness. Abdominal pain. Weight loss. Skin turning a gray or bronze color. Loss of interest in sex. Loss of menstrual periods, in women. Loss of body hair. Shortness of breath. As hemochromatosis gets worse, it may damage the liver, heart, or pancreas. This may lead to complications such as: Diabetes. Liver cancer. Abnormal heart rhythms. Heart failure. How is this diagnosed? This condition may be diagnosed based on: Your symptoms and medical history. A physical exam. Blood tests. Genetic testing. Removal and testing of a sample of liver tissue  (liver biopsy). How is this treated? This condition is most often treated with: Therapeutic phlebotomy. In this procedure, some of your blood is removed periodically to reduce the amount of iron in your body. Over time, your body will naturally replace the blood cells that you lose during phlebotomy. At the start of treatment, you may have a unit of blood removed once or twice a week. You will have blood tests during this time to determine when your iron levels return to normal. Once your iron levels are normal, you may only need to have a phlebotomy every few months. Lifestyle changes. This may include not drinking alcohol and limiting certain items in your diet. Medicines to remove excess iron (chelation therapy). Medicines to help treat any related conditions. Genetic counseling. If you are found to have a gene mutation, other family members may need to be tested for hereditary hemochromatosis. Follow these instructions at home: Alcohol use  If you have liver damage, do not drink alcohol. If you do not have liver damage: Do not drink alcohol if: Your health care provider tells you not to drink. You are pregnant, may be pregnant, or are planning to become pregnant. If you drink alcohol: Limit how much you have to: 0-1 drink a day for women. 0-2 drinks a day for men. Know how much alcohol is in your drink. In the [place] one drink equals one 12 oz bottle of beer (355 mL), one 5 oz glass of wine (148 mL), or one 1 oz glass of hard liquor (44 mL). General instructions Stay active. Exercise for at least 30 minutes on most days of the week. Take over-the-counter and prescription medicines only as told by your health care   provider. This includes vitamins and supplements. You may need to have frequent blood or urine testing to monitor your condition and to look for complications. Follow any instructions as directed by your health care provider regarding dietary restrictions. Do not: Take  vitamins or supplements that contain iron. Take vitamin C supplements. Vitamin C makes your body absorb more iron from foods. Eat raw shellfish or raw fish. Hemochromatosis may increase your chance of infection from these foods. Keep all follow-up visits. This is important. Contact a health care provider if: You have fatigue. You have unusual weakness. You have shortness of breath. You have joint pain. You have abdominal pain. You have weight loss. Get help right away if: You have chest pain. You have trouble breathing. These symptoms may represent a serious problem that is an emergency. Do not wait to see if the symptoms will go away. Get medical help right away. Call your local emergency services (911 in the [place] Do not drive yourself to the hospital. Summary Hemochromatosis is a condition in which your body stores too much iron. This is also called iron storage disease or iron overload disorder. The extra iron builds up in your joints, heart, liver, pancreas, and other organs, where it can cause damage. Signs and symptoms can start at any age, but they usually start in middle age. To treat this condition, you will need to have some of your blood removed periodically (therapeutic phlebotomy). Removing some of your blood also removes iron from your body. You may need to have frequent blood or urine testing to monitor your condition and to look for complications. This information is not intended to replace advice given to you by your health care provider. Make sure you discuss any questions you have with your health care provider. Document Revised: 08/01/2020 Document Reviewed: 08/01/2020 Elsevier Patient Education  2023 Elsevier Inc.  

## 2022-04-27 NOTE — Progress Notes (Addendum)
Subjective:    Patient ID: JAROLD CACAL, male    DOB: [birth year], 52 y.o.   MRN: [id number]   Chief Complaint: annual physical   HPI:  CALEV NAVA is a 52 y.o. who identifies as a male who was assigned male at birth.   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 Annual physical exam  2. Essential hypertension No c/o chest pain, sob or headache. Does not check blood pressure at home. BP Readings from Last 3 Encounters:  02/13/22 137/85  11/13/21 121/78  01/23/21 123/84     3. Mixed hyperlipidemia Does not really watch diet and does little to no exercise. Lab Results  Component Value Date   CHOL 111 11/13/2021   HDL 46 11/13/2021   LDLCALC 32 11/13/2021   TRIG 209 (H) 11/13/2021   CHOLHDL 2.4 11/13/2021     4. Hypokalemia No c/o muscle cramps Lab Results  Component Value Date   K 3.6 11/13/2021     5. Acquired hypothyroidism No issues that he is aware of Lab Results  Component Value Date   TSH 2.200 01/23/2021     6. Alcohol abuse Drinks on weekends  7. Insomnia, unspecified type  Sleeps about 7-8 hours a night. Sleeps on his truck several nights a week  8. Vitamin B12 deficiency Does monthly b12 injections Lab Results  Component Value Date   [id number] 11/13/2021    9. Chronic gout without tophus, unspecified cause, unspecified site No recent flare  ups  10. Hereditary hemochromatosis ([place]) No issues that he is aware of Lab Results  Component Value Date   FERRITIN 441 (H) 11/13/2021     11. BMI 25.0-25.9,adult No recent weight changes Wt Readings from Last 3 Encounters:  04/27/22 168 lb (76.2 kg)  02/13/22 170 lb 9.6 oz (77.4 kg)  11/13/21 166 lb 9.6 oz (75.6 kg)   BMI Readings from Last 3 Encounters:  04/27/22 26.31 kg/m  02/13/22 26.72 kg/m  11/13/21 26.09 kg/m     New complaints: None today  Allergies   Allergen Reactions   Amoxicillin Hives, Itching and Rash   Azithromycin Other (See Comments)    History of QT prolongation   Hctz [Hydrochlorothiazide] Rash   Tramadol Itching   Outpatient Encounter Medications as of 04/27/2022  Medication Sig   amitriptyline (ELAVIL) 150 MG tablet Take 1 tablet (150 mg total) by mouth at bedtime.   cyanocobalamin (,VITAMIN B-12,) 1000 MCG/ML injection Inject 1 mL (1,000 mcg total) into the muscle every 30 (thirty) days.   cyanocobalamin (VITAMIN B12) 1000 MCG tablet Take 1 tablet by mouth once daily   fenofibrate 54 MG tablet Take 1 tablet (54 mg total) by mouth daily.   LORazepam (ATIVAN) 2 MG tablet 1 po prn   magnesium oxide (MAG-OX) 400 MG tablet Take 1 tablet (400 mg total) by mouth daily.   metoprolol succinate (TOPROL-XL) 50 MG 24 hr tablet TAKE 1 TABLET BY MOUTH ONCE DAILY WITH OR IMMEDIATELY FOLLOWING A MEAL   pantoprazole (PROTONIX) 40 MG tablet Take 1 tablet (40 mg total) by mouth daily at 6 (six) AM.   potassium chloride (KLOR-CON M10) 10 MEQ tablet TAKE 1 TABLET BY MOUTH EVERY DAY   sildenafil (REVATIO) 20 MG tablet TAKE TWO TO FIVE TABLETS BY MOUTH AS NEEDED FOR SEX   simvastatin (ZOCOR) 40 MG tablet Take 1 tablet (40 mg total) by mouth  daily.   thiamine (VITAMIN B1) 100 MG tablet Take 1 tablet (100 mg total) by mouth daily.   No facility-administered encounter medications on file as of 04/27/2022.    Past Surgical History:  Procedure Laterality Date   TOTAL HIP ARTHROPLASTY Right    TOTAL HIP ARTHROPLASTY Left 03/16/2016   Procedure: TOTAL HIP ARTHROPLASTY;  Surgeon: Earlie Server, MD;  Location: [place];  Service: Orthopedics;  Laterality: Left;   vascularized fibular graft     right hip for avascular necrosis    Family History  Problem Relation Age of Onset   Hypertension Father    Diabetes Maternal Grandmother    Hypertension Paternal Grandmother    Stroke Paternal Grandmother       Controlled substance contract:  n/a     Review of Systems  Constitutional:  Negative for diaphoresis.  Eyes:  Negative for pain.  Respiratory:  Negative for shortness of breath.   Cardiovascular:  Negative for chest pain, palpitations and leg swelling.  Gastrointestinal:  Negative for abdominal pain.  Endocrine: Negative for polydipsia.  Skin:  Negative for rash.  Neurological:  Negative for dizziness, weakness and headaches.  Hematological:  Does not bruise/bleed easily.  All other systems reviewed and are negative.      Objective:   Physical Exam Vitals and nursing note reviewed.  Constitutional:      Appearance: Normal appearance. He is well-developed.  HENT:     Head: Normocephalic.     Nose: Nose normal.     Mouth/Throat:     Mouth: Mucous membranes are moist.     Pharynx: Oropharynx is clear.  Eyes:     Pupils: Pupils are equal, round, and reactive to light.  Neck:     Thyroid: No thyroid mass or thyromegaly.     Vascular: No carotid bruit or JVD.     Trachea: Phonation normal.  Cardiovascular:     Rate and Rhythm: Normal rate and regular rhythm.  Pulmonary:     Effort: Pulmonary effort is normal. No respiratory distress.     Breath sounds: Normal breath sounds.  Abdominal:     General: Bowel sounds are normal.     Palpations: Abdomen is soft.     Tenderness: There is no abdominal tenderness.  Musculoskeletal:        General: Normal range of motion.     Cervical back: Normal range of motion and neck supple.     Comments: Nontender nodules in the palm of both hands  Lymphadenopathy:     Cervical: No cervical adenopathy.  Skin:    General: Skin is warm and dry.  Neurological:     Mental Status: He is alert and oriented to person, place, and time.  Psychiatric:        Behavior: Behavior normal.        Thought Content: Thought content normal.        Judgment: Judgment normal.     BP 114/79   Pulse 79   Temp 97.7 F (36.5 C) (Temporal)   Resp 20   Ht '5\' 7"'$  (1.702 m)   Wt 168 lb  (76.2 kg)   SpO2 98%   BMI 26.31 kg/m   Chest xray clear EKG- NSR-Preliminary reading by Ronnald Collum, FNP  [facility]      Assessment & Plan:   Kae Heller comes in today with chief complaint of Medical Management of Chronic Issues   Diagnosis and orders addressed:  1. Annual physical exam  2. Essential  hypertension Low sodium diet - DG Chest 2 View - EKG 12-Lead - metoprolol succinate (TOPROL-XL) 50 MG 24 hr tablet; Take with or immediately following a meal.  Dispense: 90 tablet; Refill: 1  3. Mixed hyperlipidemia Low fat diet - fenofibrate 54 MG tablet; Take 1 tablet (54 mg total) by mouth daily.  Dispense: 90 tablet; Refill: 1 - simvastatin (ZOCOR) 40 MG tablet; Take 1 tablet (40 mg total) by mouth daily.  Dispense: 90 tablet; Refill: 1  4. Hypokalemia Labs pending - potassium chloride (KLOR-CON M10) 10 MEQ tablet; TAKE 1 TABLET BY MOUTH EVERY DAY  Dispense: 90 tablet; Refill: 1  5. Acquired hypothyroidism Labs pending  6. Alcohol abuse Ask to avoid alcohol - pantoprazole (PROTONIX) 40 MG tablet; Take 1 tablet (40 mg total) by mouth daily at 6 (six) AM.  Dispense: 90 tablet; Refill: 1  7. Insomnia, unspecified type Bedtime routine  8. Vitamin B12 deficiency Continue monthly b12 injections  9. Chronic gout without tophus, unspecified cause, unspecified site Avoid alcohol  10. Hereditary hemochromatosis ([place]) Need to donate a unit blood  11. BMI 25.0-25.9,adult Discussed diet and exercise for person with BMI >25 Will recheck weight in 3-6 months  Orders Placed This Encounter  Procedures   DG Chest 2 View    Order Specific Question:   Reason for Exam (SYMPTOM  OR DIAGNOSIS REQUIRED)    Answer:   screening    Order Specific Question:   Preferred imaging location?    Answer:   Internal   CBC with Differential/Platelet   CMP14+EGFR   Lipid panel   Thyroid Panel With TSH   Vitamin B12   PSA, total and free   EKG 12-Lead     Labs pending Health  Maintenance reviewed Diet and exercise encouraged  Follow up plan: 6 months   Mary-Margaret Hassell Done, FNP

## 2022-04-28 LAB — CMP14+EGFR
ALT: 10 IU/L (ref 0–44)
AST: 16 IU/L (ref 0–40)
Albumin/Globulin Ratio: 2.1 (ref 1.2–2.2)
Albumin: 4.8 g/dL (ref 3.8–4.9)
Alkaline Phosphatase: 46 IU/L (ref 44–121)
BUN/Creatinine Ratio: 19 (ref 9–20)
BUN: 17 mg/dL (ref 6–24)
Bilirubin Total: 0.5 mg/dL (ref 0.0–1.2)
CO2: 21 mmol/L (ref 20–29)
Calcium: 10 mg/dL (ref 8.7–10.2)
Chloride: 104 mmol/L (ref 96–106)
Creatinine, Ser: 0.88 mg/dL (ref 0.76–1.27)
Globulin, Total: 2.3 g/dL (ref 1.5–4.5)
Glucose: 88 mg/dL (ref 70–99)
Potassium: 3.9 mmol/L (ref 3.5–5.2)
Sodium: 141 mmol/L (ref 134–144)
Total Protein: 7.1 g/dL (ref 6.0–8.5)
eGFR: 103 mL/min/{1.73_m2} (ref >59)

## 2022-04-28 LAB — CBC WITH DIFFERENTIAL/PLATELET
Basophils Absolute: 0.1 10*3/uL (ref 0.0–0.2)
Basos: 1 %
EOS (ABSOLUTE): 0 10*3/uL (ref 0.0–0.4)
Eos: 1 %
Hematocrit: 38.9 % (ref 37.5–51.0)
Hemoglobin: 13.3 g/dL (ref 13.0–17.7)
Immature Grans (Abs): 0 10*3/uL (ref 0.0–0.1)
Immature Granulocytes: 0 %
Lymphocytes Absolute: 1.5 10*3/uL (ref 0.7–3.1)
Lymphs: 29 %
MCH: 33.4 pg — ABNORMAL HIGH (ref 26.6–33.0)
MCHC: 34.2 g/dL (ref 31.5–35.7)
MCV: 98 fL — ABNORMAL HIGH (ref 79–97)
Monocytes Absolute: 0.7 10*3/uL (ref 0.1–0.9)
Monocytes: 13 %
Neutrophils Absolute: 2.9 10*3/uL (ref 1.4–7.0)
Neutrophils: 56 %
Platelets: 191 10*3/uL (ref 150–450)
RBC: 3.98 x10E6/uL — ABNORMAL LOW (ref 4.14–5.80)
RDW: 12.9 % (ref 11.6–15.4)
WBC: 5.2 10*3/uL (ref 3.4–10.8)

## 2022-04-28 LAB — THYROID PANEL WITH TSH
Free Thyroxine Index: 2.4 (ref 1.2–4.9)
T3 Uptake Ratio: 24 % (ref 24–39)
T4, Total: 10.1 ug/dL (ref 4.5–12.0)
TSH: 1.52 u[IU]/mL (ref 0.450–4.500)

## 2022-04-28 LAB — VITAMIN B12: Vitamin B-12: 462 pg/mL (ref 232–1245)

## 2022-04-28 LAB — LIPID PANEL
Chol/HDL Ratio: 2.8 ratio (ref 0.0–5.0)
Cholesterol, Total: 132 mg/dL (ref 100–199)
HDL: 48 mg/dL (ref >39)
LDL Chol Calc (NIH): 65 mg/dL (ref 0–99)
Triglycerides: 102 mg/dL (ref 0–149)
VLDL Cholesterol Cal: 19 mg/dL (ref 5–40)

## 2022-04-28 LAB — PSA, TOTAL AND FREE
PSA, Free Pct: 22.2 %
PSA, Free: 0.2 ng/mL
Prostate Specific Ag, Serum: 0.9 ng/mL (ref 0.0–4.0)

## 2022-05-14 ENCOUNTER — Ambulatory Visit: Payer: Self-pay | Admitting: Nurse Practitioner

## 2022-05-21 ENCOUNTER — Ambulatory Visit: Payer: Self-pay | Admitting: Nurse Practitioner

## 2022-06-21 ENCOUNTER — Other Ambulatory Visit: Payer: Self-pay | Admitting: *Deleted

## 2022-06-21 MED ORDER — SILDENAFIL CITRATE 20 MG PO TABS: ORAL_TABLET | ORAL | 0 refills | Status: DC

## 2022-07-20 ENCOUNTER — Other Ambulatory Visit: Payer: Self-pay | Admitting: Nurse Practitioner

## 2022-07-20 DIAGNOSIS — F1010 Alcohol abuse, uncomplicated: Secondary | ICD-10-CM

## 2022-09-21 ENCOUNTER — Other Ambulatory Visit: Payer: Self-pay | Admitting: Nurse Practitioner

## 2022-09-21 DIAGNOSIS — G4700 Insomnia, unspecified: Secondary | ICD-10-CM

## 2022-11-02 ENCOUNTER — Ambulatory Visit: Payer: BC Managed Care – PPO | Admitting: Nurse Practitioner

## 2022-11-02 ENCOUNTER — Encounter: Payer: Self-pay | Admitting: Nurse Practitioner

## 2022-11-15 ENCOUNTER — Ambulatory Visit: Payer: Self-pay | Admitting: Nurse Practitioner

## 2022-11-20 ENCOUNTER — Telehealth: Payer: Self-pay | Admitting: Nurse Practitioner

## 2022-11-20 DIAGNOSIS — I10 Essential (primary) hypertension: Secondary | ICD-10-CM

## 2022-11-20 MED ORDER — METOPROLOL SUCCINATE ER 50 MG PO TB24
50.0000 mg | ORAL_TABLET | Freq: Every day | ORAL | 1 refills | Status: AC
  Filled 2023-05-27: qty 90, 90d supply, fill #0

## 2022-11-20 NOTE — Telephone Encounter (Signed)
  Prescription Request  11/20/2022  Is this a "Controlled Substance" medicine? no  Have you seen your PCP in the last 2 weeks? No pt has appt on 12/20/22  If YES, route message to pool  -  If NO, patient needs to be scheduled for appointment.  What is the name of the medication or equipment? metoprolol succinate (TOPROL-XL) 50 MG 24 hr tablet   Have you contacted your pharmacy to request a refill? yes   Which pharmacy would you like this sent to? [facility]    Patient notified that their request is being sent to the clinical staff for review and that they should receive a response within 2 business days.

## 2022-11-20 NOTE — Telephone Encounter (Signed)
Refill sent to [facility] per patients request

## 2022-11-26 ENCOUNTER — Other Ambulatory Visit: Payer: Self-pay | Admitting: Nurse Practitioner

## 2022-11-26 DIAGNOSIS — G4700 Insomnia, unspecified: Secondary | ICD-10-CM

## 2022-11-27 ENCOUNTER — Ambulatory Visit: Payer: Self-pay | Admitting: Nurse Practitioner

## 2022-11-27 VITALS — BP 120/83 | HR 55 | Temp 98.3°F | Resp 20 | Ht 67.0 in | Wt 162.0 lb

## 2022-11-27 DIAGNOSIS — I10 Essential (primary) hypertension: Secondary | ICD-10-CM

## 2022-11-27 DIAGNOSIS — E782 Mixed hyperlipidemia: Secondary | ICD-10-CM

## 2022-11-27 DIAGNOSIS — K219 Gastro-esophageal reflux disease without esophagitis: Secondary | ICD-10-CM

## 2022-11-27 DIAGNOSIS — E876 Hypokalemia: Secondary | ICD-10-CM

## 2022-11-27 DIAGNOSIS — G4700 Insomnia, unspecified: Secondary | ICD-10-CM

## 2022-11-27 DIAGNOSIS — E538 Deficiency of other specified B group vitamins: Secondary | ICD-10-CM

## 2022-11-27 DIAGNOSIS — E039 Hypothyroidism, unspecified: Secondary | ICD-10-CM

## 2022-11-27 DIAGNOSIS — M1A9XX0 Chronic gout, unspecified, without tophus (tophi): Secondary | ICD-10-CM

## 2022-11-27 MED ORDER — FENOFIBRATE 54 MG PO TABS
54.0000 mg | ORAL_TABLET | Freq: Every day | ORAL | 1 refills | Status: DC
Start: 2022-11-27 — End: 2023-07-21

## 2022-11-27 MED ORDER — SIMVASTATIN 40 MG PO TABS
40.0000 mg | ORAL_TABLET | Freq: Every day | ORAL | 1 refills | Status: DC
Start: 2022-11-27 — End: 2023-07-26
  Filled 2023-05-27: qty 90, 90d supply, fill #0

## 2022-11-27 MED ORDER — POTASSIUM CHLORIDE CRYS ER 10 MEQ PO TBCR
EXTENDED_RELEASE_TABLET | ORAL | 1 refills | Status: DC
Start: 2022-11-27 — End: 2023-07-21

## 2022-11-27 NOTE — Progress Notes (Signed)
Subjective:    Patient ID: Christian King, male    DOB: 1970/08/22, 52 y.o.   MRN: [id number]   Chief Complaint: medical management of chronic issues     HPI:  Christian King is a 52 y.o. who identifies as a male who was assigned male at birth.   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 Essential hypertension No c/o chest pain, sob or headache. Does not check blood pressure at home BP Readings from Last 3 Encounters:  04/27/22 114/79  02/13/22 137/85  11/13/21 121/78    2. Mixed hyperlipidemia Does not watch diet and does no exercise Lab Results  Component Value Date   CHOL 132 04/27/2022   HDL 48 04/27/2022   LDLCALC 65 04/27/2022   TRIG 102 04/27/2022   CHOLHDL 2.8 04/27/2022     3. Hypokalemia No c/o muscle cramps Lab Results  Component Value Date   K 3.9 04/27/2022     4. Hereditary hemochromatosis (HCC) N issues currently Lab Results  Component Value Date   WBC 5.2 04/27/2022   HGB 13.3 04/27/2022   HCT 38.9 04/27/2022   MCV 98 (H) 04/27/2022   PLT 191 04/27/2022     5. Acquired hypothyroidism No issues  that he is aware of Lab Results  Component Value Date   TSH 1.520 04/27/2022     6. Chronic gout without tophus, unspecified cause, unspecified site No recent flare ups  7. Insomnia, unspecified type Takes amitriptyline  usually at night to sleep. Sleeps well most nights  8. Vitamin B12 deficiency Is no longer on b12 supplement  9. gerd Is on protonix and is doing well.  New complaints: None today  Allergies  Allergen Reactions   Amoxicillin Hives, Itching and Rash   Azithromycin Other (See Comments)    History of QT prolongation   Hctz [Hydrochlorothiazide] Rash   Tramadol Itching   Outpatient Encounter Medications as of 11/27/2022  Medication Sig   amitriptyline (ELAVIL) 150 MG tablet  Take 1 tablet (150 mg total) by mouth at bedtime.   cyanocobalamin (,VITAMIN B-12,) 1000 MCG/ML injection Inject 1 mL (1,000 mcg total) into the muscle every 30 (thirty) days.   cyanocobalamin (VITAMIN B12) 1000 MCG tablet Take 1 tablet by mouth once daily   fenofibrate 54 MG tablet Take 1 tablet (54 mg total) by mouth daily.   LORazepam (ATIVAN) 2 MG tablet 1 po prn   magnesium oxide (MAG-OX) 400 MG tablet Take 1 tablet (400 mg total) by mouth daily.   metoprolol succinate (TOPROL-XL) 50 MG 24 hr tablet Take with or immediately following a meal.   pantoprazole (PROTONIX) 40 MG tablet Take 1 tablet (40 mg total) by mouth daily at 6 (six) AM.   potassium chloride (KLOR-CON M10) 10 MEQ tablet TAKE 1 TABLET BY MOUTH EVERY DAY   sildenafil (REVATIO) 20 MG tablet TAKE TWO TO FIVE TABLETS BY MOUTH AS NEEDED FOR SEX   simvastatin (ZOCOR) 40 MG tablet Take 1 tablet (40 mg total) by mouth daily.   thiamine (VITAMIN B1) 100 MG tablet Take 1 tablet by mouth once daily   No facility-administered encounter medications on file as of 11/27/2022.    Past Surgical History:  Procedure Laterality Date   TOTAL HIP ARTHROPLASTY Right    TOTAL HIP ARTHROPLASTY Left 03/16/2016   Procedure: TOTAL HIP ARTHROPLASTY;  Surgeon: Frederico Hamman, MD;  Location: [facility] OR;  Service: Orthopedics;  Laterality: Left;   vascularized fibular graft     right hip for avascular necrosis    Family History  Problem Relation Age of Onset   Hypertension Father    Diabetes Maternal Grandmother    Hypertension Paternal Grandmother    Stroke Paternal Grandmother       Controlled substance contract: n/a     Review of Systems  Constitutional:  Negative for diaphoresis.  Eyes:  Negative for pain.  Respiratory:  Negative for shortness of breath.   Cardiovascular:  Negative for chest pain, palpitations and leg swelling.  Gastrointestinal:  Negative for abdominal pain.  Endocrine: Negative for polydipsia.  Skin:  Negative for  rash.  Neurological:  Negative for dizziness, weakness and headaches.  Hematological:  Does not bruise/bleed easily.  All other systems reviewed and are negative.      Objective:   Physical Exam Vitals and nursing note reviewed.  Constitutional:      Appearance: Normal appearance. He is well-developed.  HENT:     Head: Normocephalic.     Nose: Nose normal.     Mouth/Throat:     Mouth: Mucous membranes are moist.     Pharynx: Oropharynx is clear.  Eyes:     Pupils: Pupils are equal, round, and reactive to light.  Neck:     Thyroid: No thyroid mass or thyromegaly.     Vascular: No carotid bruit or JVD.     Trachea: Phonation normal.  Cardiovascular:     Rate and Rhythm: Normal rate and regular rhythm.  Pulmonary:     Effort: Pulmonary effort is normal. No respiratory distress.     Breath sounds: Normal breath sounds.  Abdominal:     General: Bowel sounds are normal.     Palpations: Abdomen is soft.     Tenderness: There is no abdominal tenderness.  Musculoskeletal:        General: Normal range of motion.     Cervical back: Normal range of motion and neck supple.  Lymphadenopathy:     Cervical: No cervical adenopathy.  Skin:    General: Skin is warm and dry.  Neurological:     Mental Status: He is alert and oriented to person, place, and time.  Psychiatric:        Behavior: Behavior normal.        Thought Content: Thought content normal.        Judgment: Judgment normal.    BP 120/83   Pulse (!) 55   Temp 98.3 F (36.8 C) (Temporal)   Resp 20   Ht 5\' 7"  (1.702 m)   Wt 162 lb (73.5 kg)   SpO2 96%   BMI 25.37 kg/m         Assessment & Plan:   Christian King comes in today with chief complaint of Medical Management of Chronic Issues   Diagnosis and orders addressed:  1. Essential hypertension Low sodium diet  2. Mixed hyperlipidemia Low fta diet - fenofibrate 54 MG tablet; Take 1 tablet (54 mg total) by mouth daily.  Dispense: 90 tablet;  Refill: 1 - simvastatin (ZOCOR) 40 MG tablet; Take 1 tablet (40 mg total) by mouth daily.  Dispense: 90 tablet; Refill: 1  3. Hypokalemia - potassium chloride (KLOR-CON M10) 10 MEQ tablet; TAKE 1 TABLET BY MOUTH EVERY DAY  Dispense: 90 tablet; Refill: 1  4. Hereditary hemochromatosis (HCC)  5. Acquired hypothyroidism  6. Chronic gout  without tophus, unspecified cause, unspecified site  7. Insomnia, unspecified type  8. Vitamin B12 deficiency  9. Gastroesophageal reflux disease without esophagitis    Labs - refuses because currently has no  insurance Health Maintenance reviewed Diet and exercise encouraged  Follow up plan: 6 months   Mary-Margaret Daphine Deutscher, FNP

## 2022-11-27 NOTE — Patient Instructions (Signed)

## 2022-11-29 ENCOUNTER — Telehealth: Payer: Self-pay | Admitting: Nurse Practitioner

## 2022-11-30 NOTE — Telephone Encounter (Signed)
Attempted to contact patient. No answer and voicemail was full.

## 2022-12-05 NOTE — Telephone Encounter (Signed)
Attempted to contact patient again with no answer and unable to leave voicemail. Closing encounter

## 2022-12-20 ENCOUNTER — Ambulatory Visit: Payer: Self-pay | Admitting: Nurse Practitioner

## 2022-12-25 ENCOUNTER — Telehealth: Payer: Self-pay | Admitting: Nurse Practitioner

## 2022-12-25 ENCOUNTER — Other Ambulatory Visit: Payer: Self-pay

## 2022-12-25 DIAGNOSIS — F419 Anxiety disorder, unspecified: Secondary | ICD-10-CM

## 2022-12-25 NOTE — Telephone Encounter (Signed)
Patient calling back to check on message left this morning. Please call.

## 2022-12-25 NOTE — Telephone Encounter (Signed)
Pt was seen on 11/27/22 was told that he needed to take a drug test to be prescribed LORazepam (ATIVAN) 2 MG tablet, pt does not understand why I explained to pt that it is office policy that all patients that are on controlled substances/narcotics have to take a drug test in order to be prescribed this medication he is still not understanding this because almost everyone takes this and he thought this medication was just a nerve pill. Pt wants to know does he have to see MMM again or just come back for drug test? Please call pt back

## 2022-12-25 NOTE — Telephone Encounter (Signed)
Has  to come in for drug screen, we discussed this at his appointment

## 2022-12-25 NOTE — Telephone Encounter (Signed)
Called and spoke with patient and advised that he would need to come in for drug screen. Future orders placed and patient verbalized understanding

## 2022-12-27 ENCOUNTER — Other Ambulatory Visit: Payer: Self-pay | Admitting: Nurse Practitioner

## 2023-05-03 ENCOUNTER — Other Ambulatory Visit (HOSPITAL_COMMUNITY): Payer: Self-pay

## 2023-05-03 MED ORDER — POTASSIUM CHLORIDE ER 10 MEQ PO TBCR
10.0000 meq | EXTENDED_RELEASE_TABLET | Freq: Every day | ORAL | 2 refills | Status: DC
Start: 2022-11-27 — End: 2023-07-26
  Filled 2023-05-27: qty 90, 90d supply, fill #0

## 2023-05-03 MED ORDER — THIAMINE HCL 100 MG PO TABS
100.0000 mg | ORAL_TABLET | Freq: Every day | ORAL | 1 refills | Status: DC
Start: 2023-03-25 — End: 2023-07-26
  Filled 2023-05-27: qty 90, 90d supply, fill #0

## 2023-05-27 ENCOUNTER — Encounter: Payer: Self-pay | Admitting: Nurse Practitioner

## 2023-05-27 ENCOUNTER — Other Ambulatory Visit (HOSPITAL_COMMUNITY): Payer: Self-pay

## 2023-05-27 ENCOUNTER — Ambulatory Visit: Payer: Self-pay | Admitting: Nurse Practitioner

## 2023-05-27 MED FILL — Magnesium Oxide Tab 400 MG (240 MG Elemental Mg): ORAL | 90 days supply | Qty: 90 | Fill #0 | Status: AC

## 2023-05-27 NOTE — Progress Notes (Deleted)
 Subjective:    Patient ID: Christian King, male    DOB: 1970/05/17, 53 y.o.   MRN: [id number]   Chief Complaint: annual physical   HPI:  Christian King is a 53 y.o. who identifies as a male who was assigned male at birth.   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 Annual physical exam  2. Essential hypertension No c/o chest pain, sob or headache. Does not check blood pressure at home. BP Readings from Last 3 Encounters:  11/27/22 120/83  04/27/22 114/79  02/13/22 137/85     3. Mixed hyperlipidemia Does not really watch diet and does little to no exercise. Lab Results  Component Value Date   CHOL 132 04/27/2022   HDL 48 04/27/2022   LDLCALC 65 04/27/2022   TRIG 102 04/27/2022   CHOLHDL 2.8 04/27/2022     4. Hypokalemia No c/o muscle cramps Lab Results  Component Value Date   K 3.9 04/27/2022     5. Acquired hypothyroidism No issues that he is aware of Lab Results  Component Value Date   TSH 1.520 04/27/2022     6. Alcohol abuse Drinks on weekends  7. Insomnia, unspecified type  Sleeps about 7-8 hours a night. Sleeps on his truck several nights a week  8. Vitamin B12 deficiency Does monthly b12 injections Lab Results  Component Value Date   VITAMINB12 462 04/27/2022    9. Chronic gout without tophus, unspecified cause, unspecified site No recent flare  ups  10. Hereditary hemochromatosis (HCC) No issues that he is aware of Lab Results  Component Value Date   FERRITIN 441 (H) 11/13/2021     11. BMI 25.0-25.9,adult No recent weight changes  ***   New complaints: None today  Allergies  Allergen Reactions   Amoxicillin Hives, Itching and Rash   Azithromycin Other (See Comments)    History of QT prolongation   Hctz [Hydrochlorothiazide] Rash   Tramadol Itching   Outpatient Encounter Medications as of 05/27/2023  Medication  Sig   cyanocobalamin (,VITAMIN B-12,) 1000 MCG/ML injection Inject 1 mL (1,000 mcg total) into the muscle every 30 (thirty) days.   cyanocobalamin (VITAMIN B12) 1000 MCG tablet Take 1 tablet by mouth once daily   fenofibrate 54 MG tablet Take 1 tablet (54 mg total) by mouth daily.   LORazepam (ATIVAN) 2 MG tablet 1 po prn (Patient not taking: Reported on 11/27/2022)   MAGnesium-Oxide 400 (240 Mg) MG tablet Take 1 tablet by mouth once daily   metoprolol succinate (TOPROL-XL) 50 MG 24 hr tablet Take 1 tablet (50 mg total) by mouth daily with or immediately following a meal.   potassium chloride (KLOR-CON M10) 10 MEQ tablet TAKE 1 TABLET BY MOUTH EVERY DAY   potassium chloride (KLOR-CON) 10 MEQ tablet Take 1 tablet (10 mEq total) by mouth daily.   sildenafil (REVATIO) 20 MG tablet TAKE TWO TO FIVE TABLETS BY MOUTH AS NEEDED FOR SEX   simvastatin (ZOCOR) 40 MG tablet Take 1 tablet (40 mg total) by mouth daily.   thiamine (VITAMIN B1) 100 MG tablet Take 1 tablet by mouth once daily   thiamine (VITAMIN B1) 100 MG tablet Take 1 tablet (100 mg total) by mouth daily.   No facility-administered encounter medications on file as of 05/27/2023.    Past Surgical History:  Procedure Laterality Date   TOTAL HIP ARTHROPLASTY Right  TOTAL HIP ARTHROPLASTY Left 03/16/2016   Procedure: TOTAL HIP ARTHROPLASTY;  Surgeon: Frederico Hamman, MD;  Location: [facility] OR;  Service: Orthopedics;  Laterality: Left;   vascularized fibular graft     right hip for avascular necrosis    Family History  Problem Relation Age of Onset   Hypertension Father    Diabetes Maternal Grandmother    Hypertension Paternal Grandmother    Stroke Paternal Grandmother       Controlled substance contract: n/a     Review of Systems  Constitutional:  Negative for diaphoresis.  Eyes:  Negative for pain.  Respiratory:  Negative for shortness of breath.   Cardiovascular:  Negative for chest pain, palpitations and leg swelling.   Gastrointestinal:  Negative for abdominal pain.  Endocrine: Negative for polydipsia.  Skin:  Negative for rash.  Neurological:  Negative for dizziness, weakness and headaches.  Hematological:  Does not bruise/bleed easily.  All other systems reviewed and are negative.      Objective:   Physical Exam Vitals and nursing note reviewed.  Constitutional:      Appearance: Normal appearance. He is well-developed.  HENT:     Head: Normocephalic.     Nose: Nose normal.     Mouth/Throat:     Mouth: Mucous membranes are moist.     Pharynx: Oropharynx is clear.  Eyes:     Pupils: Pupils are equal, round, and reactive to light.  Neck:     Thyroid: No thyroid mass or thyromegaly.     Vascular: No carotid bruit or JVD.     Trachea: Phonation normal.  Cardiovascular:     Rate and Rhythm: Normal rate and regular rhythm.  Pulmonary:     Effort: Pulmonary effort is normal. No respiratory distress.     Breath sounds: Normal breath sounds.  Abdominal:     General: Bowel sounds are normal.     Palpations: Abdomen is soft.     Tenderness: There is no abdominal tenderness.  Musculoskeletal:        General: Normal range of motion.     Cervical back: Normal range of motion and neck supple.     Comments: Nontender nodules in the palm of both hands  Lymphadenopathy:     Cervical: No cervical adenopathy.  Skin:    General: Skin is warm and dry.  Neurological:     Mental Status: He is alert and oriented to person, place, and time.  Psychiatric:        Behavior: Behavior normal.        Thought Content: Thought content normal.        Judgment: Judgment normal.     There were no vitals taken for this visit.  Chest xray clear EKG- NSR-Preliminary reading by Paulene Floor, FNP  [facility]      Assessment & Plan:   Christian King comes in today with chief complaint of No chief complaint on file.   Diagnosis and orders addressed:  1. Annual physical exam  2. Essential hypertension Low  sodium diet - DG Chest 2 View - EKG 12-Lead - metoprolol succinate (TOPROL-XL) 50 MG 24 hr tablet; Take with or immediately following a meal.  Dispense: 90 tablet; Refill: 1  3. Mixed hyperlipidemia Low fat diet - fenofibrate 54 MG tablet; Take 1 tablet (54 mg total) by mouth daily.  Dispense: 90 tablet; Refill: 1 - simvastatin (ZOCOR) 40 MG tablet; Take 1 tablet (40 mg total) by mouth daily.  Dispense: 90 tablet; Refill: 1  4. Hypokalemia Labs pending - potassium chloride (KLOR-CON M10) 10 MEQ tablet; TAKE 1 TABLET BY MOUTH EVERY DAY  Dispense: 90 tablet; Refill: 1  5. Acquired hypothyroidism Labs pending  6. Alcohol abuse Ask to avoid alcohol - pantoprazole (PROTONIX) 40 MG tablet; Take 1 tablet (40 mg total) by mouth daily at 6 (six) AM.  Dispense: 90 tablet; Refill: 1  7. Insomnia, unspecified type Bedtime routine  8. Vitamin B12 deficiency Continue monthly b12 injections  9. Chronic gout without tophus, unspecified cause, unspecified site Avoid alcohol  10. Hereditary hemochromatosis (HCC) Need to donate a unit blood  11. BMI 25.0-25.9,adult Discussed diet and exercise for person with BMI >25 Will recheck weight in 3-6 months  No orders of the defined types were placed in this encounter.    Labs pending Health Maintenance reviewed Diet and exercise encouraged  Follow up plan: 6 months   Mary-Margaret Daphine Deutscher, FNP

## 2023-07-19 ENCOUNTER — Emergency Department (HOSPITAL_COMMUNITY)

## 2023-07-19 ENCOUNTER — Other Ambulatory Visit: Payer: Self-pay

## 2023-07-19 ENCOUNTER — Encounter (HOSPITAL_COMMUNITY): Payer: Self-pay | Admitting: Emergency Medicine

## 2023-07-19 ENCOUNTER — Emergency Department (HOSPITAL_COMMUNITY)
Admission: EM | Admit: 2023-07-19 | Discharge: 2023-07-20 | Disposition: A | Discharge status: Other Acute Inpatient Hospital | Attending: Emergency Medicine | Admitting: Emergency Medicine

## 2023-07-19 DIAGNOSIS — R410 Disorientation, unspecified: Secondary | ICD-10-CM | POA: Diagnosis not present

## 2023-07-19 DIAGNOSIS — F10921 Alcohol use, unspecified with intoxication delirium: Secondary | ICD-10-CM | POA: Insufficient documentation

## 2023-07-19 DIAGNOSIS — S01111A Laceration without foreign body of right eyelid and periocular area, initial encounter: Secondary | ICD-10-CM | POA: Diagnosis not present

## 2023-07-19 DIAGNOSIS — F29 Unspecified psychosis not due to a substance or known physiological condition: Secondary | ICD-10-CM | POA: Diagnosis not present

## 2023-07-19 DIAGNOSIS — S0591XA Unspecified injury of right eye and orbit, initial encounter: Secondary | ICD-10-CM | POA: Diagnosis present

## 2023-07-19 DIAGNOSIS — W19XXXA Unspecified fall, initial encounter: Secondary | ICD-10-CM

## 2023-07-19 DIAGNOSIS — W01198A Fall on same level from slipping, tripping and stumbling with subsequent striking against other object, initial encounter: Secondary | ICD-10-CM | POA: Diagnosis not present

## 2023-07-19 DIAGNOSIS — S0990XA Unspecified injury of head, initial encounter: Secondary | ICD-10-CM

## 2023-07-19 DIAGNOSIS — F10929 Alcohol use, unspecified with intoxication, unspecified: Secondary | ICD-10-CM | POA: Diagnosis not present

## 2023-07-19 DIAGNOSIS — Y908 Blood alcohol level of 240 mg/100 ml or more: Secondary | ICD-10-CM | POA: Diagnosis not present

## 2023-07-19 DIAGNOSIS — R220 Localized swelling, mass and lump, head: Secondary | ICD-10-CM | POA: Diagnosis not present

## 2023-07-19 DIAGNOSIS — S199XXA Unspecified injury of neck, initial encounter: Secondary | ICD-10-CM | POA: Diagnosis not present

## 2023-07-19 DIAGNOSIS — S0181XA Laceration without foreign body of other part of head, initial encounter: Secondary | ICD-10-CM | POA: Insufficient documentation

## 2023-07-19 DIAGNOSIS — R0689 Other abnormalities of breathing: Secondary | ICD-10-CM | POA: Diagnosis not present

## 2023-07-19 LAB — CBC
HCT: 40.5 % (ref 39.0–52.0)
Hemoglobin: 14.4 g/dL (ref 13.0–17.0)
MCH: 35.3 pg — ABNORMAL HIGH (ref 26.0–34.0)
MCHC: 35.6 g/dL (ref 30.0–36.0)
MCV: 99.3 fL (ref 80.0–100.0)
Platelets: 111 10*3/uL — ABNORMAL LOW (ref 150–400)
RBC: 4.08 MIL/uL — ABNORMAL LOW (ref 4.22–5.81)
RDW: 14.5 % (ref 11.5–15.5)
WBC: 2.7 10*3/uL — ABNORMAL LOW (ref 4.0–10.5)
nRBC: 0 % (ref 0.0–0.2)

## 2023-07-19 LAB — COMPREHENSIVE METABOLIC PANEL WITH GFR
ALT: 38 U/L (ref 0–44)
AST: 55 U/L — ABNORMAL HIGH (ref 15–41)
Albumin: 4 g/dL (ref 3.5–5.0)
Alkaline Phosphatase: 63 U/L (ref 38–126)
Anion gap: 20 — ABNORMAL HIGH (ref 5–15)
BUN: 9 mg/dL (ref 6–20)
CO2: 23 mmol/L (ref 22–32)
Calcium: 8.8 mg/dL — ABNORMAL LOW (ref 8.9–10.3)
Chloride: 93 mmol/L — ABNORMAL LOW (ref 98–111)
Creatinine, Ser: 0.82 mg/dL (ref 0.61–1.24)
GFR, Estimated: >60 mL/min (ref >60)
Glucose, Bld: 192 mg/dL — ABNORMAL HIGH (ref 70–99)
Potassium: 3.3 mmol/L — ABNORMAL LOW (ref 3.5–5.1)
Sodium: 136 mmol/L (ref 135–145)
Total Bilirubin: 0.6 mg/dL (ref 0.0–1.2)
Total Protein: 7.3 g/dL (ref 6.5–8.1)

## 2023-07-19 LAB — ETHANOL: Alcohol, Ethyl (B): 351 mg/dL

## 2023-07-19 MED ORDER — LIDOCAINE HCL (PF) 1 % IJ SOLN
30.0000 mL | Freq: Once | INTRAMUSCULAR | Status: AC
  Administered 2023-07-19: 30 mL via INTRADERMAL
  Filled 2023-07-19: qty 30

## 2023-07-19 MED ORDER — LORAZEPAM 1 MG PO TABS
0.0000 mg | ORAL_TABLET | Freq: Four times a day (QID) | ORAL | Status: DC
  Administered 2023-07-20 (×2): 1 mg via ORAL
  Administered 2023-07-20: 2 mg via ORAL
  Filled 2023-07-19: qty 1
  Filled 2023-07-19: qty 2
  Filled 2023-07-19: qty 1

## 2023-07-19 MED ORDER — THIAMINE MONONITRATE 100 MG PO TABS
100.0000 mg | ORAL_TABLET | Freq: Every day | ORAL | Status: DC
  Administered 2023-07-19 – 2023-07-20 (×2): 100 mg via ORAL
  Filled 2023-07-19 (×2): qty 1

## 2023-07-19 MED ORDER — LORAZEPAM 1 MG PO TABS: 0.0000 mg | ORAL_TABLET | Freq: Two times a day (BID) | ORAL | Status: DC

## 2023-07-19 MED ORDER — LORAZEPAM 2 MG/ML IJ SOLN: 0.0000 mg | Freq: Two times a day (BID) | INTRAMUSCULAR | Status: DC

## 2023-07-19 MED ORDER — THIAMINE HCL 100 MG/ML IJ SOLN: 100.0000 mg | Freq: Every day | INTRAMUSCULAR | Status: DC

## 2023-07-19 MED ORDER — LORAZEPAM 2 MG/ML IJ SOLN
0.0000 mg | Freq: Four times a day (QID) | INTRAMUSCULAR | Status: DC
  Administered 2023-07-20: 2 mg via INTRAVENOUS
  Filled 2023-07-19: qty 1

## 2023-07-19 NOTE — ED Notes (Signed)
 Pt states he has no desire to harm himself, however wife states he has threatened to shoot himself in the head. Pt does have access to guns.

## 2023-07-19 NOTE — ED Notes (Signed)
 Family keeps calling asking about patient. Patient is confidential encounter, told them he is not here. Christian King they know he is here because wife told them. Called wife and asked if patient is to remain confidential encounter, she said yes.

## 2023-07-19 NOTE — ED Triage Notes (Signed)
 Pt bib [facility] after a fall educed by intoxication. Pt fell and reportedly hit his head on the corner of an entertainment center and a thick large glass picture frame. Pt is somewhat alert, but continues to fall asleep and shows signs of continued intoxication. Wife states he has been drinking heavily for 9 weeks and is a daily drinker.

## 2023-07-19 NOTE — ED Notes (Signed)
 IVC PAPERWORK FAXED TO [facility] @ [phone], [phone] & [phone]

## 2023-07-19 NOTE — ED Notes (Signed)
 Patient dressed out in paper scrubs, clothes placed in locker 9. Offered socks, declined, allowed to keep his sandals and a pillow from home.

## 2023-07-19 NOTE — ED Notes (Signed)
 Spoke with wife, Benigno Brakeman, over the phone. Updated on scan results. She said it was acceptable to leave her a detailed message if we need to update her throughout the night and she didn't answer.  Patient sleeping at this time.

## 2023-07-19 NOTE — ED Notes (Signed)
 [facility] arrived with IVC papers

## 2023-07-19 NOTE — ED Provider Notes (Signed)
 [place] EMERGENCY DEPARTMENT AT [facility] Provider Note   CSN: [id number] Arrival date & time: 07/19/23  1342     History  Chief Complaint  Patient presents with   Christian King is a 53 y.o. male.  HPI Patient presents with his wife, though via EMS. Patient had mechanical fall, witnessed by his wife.  She also notes the patient has been drinking heavily today.  Wife notes that the patient after drinking heavily, made threatening comments towards her, and towards EMS staff on their arrival.  He reportedly had a fall, face first against a solid object, sustained multiple lacerations to his forehead.  Patient mumbling answers, largely noncontributory as a historian. EMS reports no hemodynamic instability, cervical spine immobilization was partially successful.    Home Medications Prior to Admission medications   Medication Sig Start Date End Date Taking? Authorizing Provider  cyanocobalamin  (,VITAMIN B-12,) 1000 MCG/ML injection Inject 1 mL (1,000 mcg total) into the muscle every 30 (thirty) days. 05/25/20   Colin Dawley, MD  cyanocobalamin  (VITAMIN B12) 1000 MCG tablet Take 1 tablet by mouth once daily 12/27/22   Delfina Feller, FNP  fenofibrate  54 MG tablet Take 1 tablet (54 mg total) by mouth daily. 11/27/22   Delfina Feller, FNP  LORazepam  (ATIVAN ) 2 MG tablet 1 po prn Patient not taking: Reported on 11/27/2022 11/13/21   Delfina Feller, FNP  MAGnesium -Oxide 400 (240 Mg) MG tablet Take 1 tablet by mouth once daily 12/27/22   Delfina Feller, FNP  metoprolol  succinate (TOPROL -XL) 50 MG 24 hr tablet Take 1 tablet (50 mg total) by mouth daily with or immediately following a meal. 11/20/22   Gaylyn Keas, Mary-Margaret, FNP  potassium chloride  (KLOR-CON  M10) 10 MEQ tablet TAKE 1 TABLET BY MOUTH EVERY DAY 11/27/22   Gaylyn Keas, Mary-Margaret, FNP  potassium chloride  (KLOR-CON ) 10 MEQ tablet Take 1 tablet (10 mEq total) by mouth daily. 11/27/22    Gaylyn Keas Mary-Margaret, FNP  sildenafil  (REVATIO ) 20 MG tablet TAKE TWO TO FIVE TABLETS BY MOUTH AS NEEDED FOR SEX 06/21/22   Gaylyn Keas, Mary-Margaret, FNP  simvastatin  (ZOCOR ) 40 MG tablet Take 1 tablet (40 mg total) by mouth daily. 11/27/22   Delfina Feller, FNP  thiamine  (VITAMIN B1) 100 MG tablet Take 1 tablet by mouth once daily 07/20/22   Gaylyn Keas, Mary-Margaret, FNP  thiamine  (VITAMIN B1) 100 MG tablet Take 1 tablet (100 mg total) by mouth daily. 03/25/23   Gaylyn Keas Mary-Margaret, FNP      Allergies    Amoxicillin , Azithromycin , Hctz [hydrochlorothiazide], and Tramadol    Review of Systems   Review of Systems  Physical Exam Updated Vital Signs BP 114/78   Pulse 97   Temp 98.7 F (37.1 C) (Oral)   Resp 16   Ht 5\' 7"  (1.702 m)   Wt 79.4 kg   SpO2 95%   BMI 27.41 kg/m  Physical Exam Vitals and nursing note reviewed.  Constitutional:      General: He is not in acute distress.    Appearance: He is well-developed.  HENT:     Head: Normocephalic.   Eyes:     Conjunctiva/sclera: Conjunctivae normal.  Cardiovascular:     Rate and Rhythm: Normal rate and regular rhythm.  Pulmonary:     Effort: Pulmonary effort is normal. No respiratory distress.     Breath sounds: No stridor.  Abdominal:     General: There is no distension.  Skin:    General: Skin is warm and dry.  Neurological:  Mental Status: He is alert.     Comments: Intoxicated, moving all extremity spontaneously, not following commands consistently.     ED Results / Procedures / Treatments   Labs (all labs ordered are listed, but only abnormal results are displayed) Labs Reviewed  COMPREHENSIVE METABOLIC PANEL WITH GFR - Abnormal; Notable for the following components:      Result Value   Potassium 3.3 (*)    Chloride 93 (*)    Glucose, Bld 192 (*)    Calcium  8.8 (*)    AST 55 (*)    Anion gap 20 (*)    All other components within normal limits  CBC - Abnormal; Notable for the following components:   WBC  2.7 (*)    RBC 4.08 (*)    MCH 35.3 (*)    Platelets 111 (*)    All other components within normal limits  ETHANOL - Abnormal; Notable for the following components:   Alcohol, Ethyl (B) 351 (*)    All other components within normal limits    EKG EKG Interpretation Date/Time:  Friday Jul 19 2023 14:08:20 EDT Ventricular Rate:  76 PR Interval:  137 QRS Duration:  85 QT Interval:  371 QTC Calculation: 418 R Axis:   81  Text Interpretation: Sinus rhythm Low voltage, precordial leads Nonspecific T abnormalities, lateral leads Baseline wander in lead(s) I II aVR Confirmed by Dorenda Gandy [phone]) on 07/19/2023 2:17:00 PM  Radiology CT MAXILLOFACIAL WO CONTRAST Result Date: 07/19/2023 CLINICAL DATA:  Head trauma, fall induced by intoxication. Hit head on corner of furniture. EXAM: CT HEAD WITHOUT CONTRAST CT MAXILLOFACIAL WITHOUT CONTRAST CT CERVICAL SPINE WITHOUT CONTRAST TECHNIQUE: Multidetector CT imaging of the head, cervical spine, and maxillofacial structures were performed using the standard protocol without intravenous contrast. Multiplanar CT image reconstructions of the cervical spine and maxillofacial structures were also generated. RADIATION DOSE REDUCTION: This exam was performed according to the departmental dose-optimization program which includes automated exposure control, adjustment of the mA and/or kV according to patient size and/or use of iterative reconstruction technique. COMPARISON:  CT head 05/22/2020. FINDINGS: CT HEAD FINDINGS Brain: No acute intracranial hemorrhage. No CT evidence of acute infarct. No edema, mass effect, or midline shift. The basilar cisterns are patent. Ventricles: The ventricles are normal. Vascular: No hyperdense vessel or unexpected calcification. Skull: No acute or aggressive finding. Other: Mastoid air cells are clear. CT MAXILLOFACIAL FINDINGS Osseous: Maxilla: Intact Pterygoid Plates: Intact Zygomatic Arch: Intact Orbits: Intact Ethmoid: Intact  Sphenoid: Intact Frontal:Intact Mandible: Intact. No condylar dislocation. Nasal: Intact Nasal Septum: Intact.  Rightward bowing of the osseous nasal septum. Orbits: Globes are intact. Lenses are normally located. Normal appearance of the extraocular muscles and optic nerve sheath complexes. Normal appearance of the intraorbital fat. There is soft tissue swelling over the right supraorbital ridge with skin irregularity compatible with laceration. Soft tissue swelling extends into the right preseptal soft tissues along the superior aspect of the right orbit. Sinuses: No significant mucosal thickening.  No air-fluid levels. Soft tissues: Soft tissue swelling and laceration over the right supraorbital ridge. CT CERVICAL SPINE FINDINGS Alignment: Cervical lordosis is maintained. No significant listhesis. No facet subluxation or dislocation. Skull base and vertebrae: No acute fracture. No primary bone lesion or focal pathologic process. Soft tissues and spinal canal: No prevertebral fluid or swelling. No visible canal hematoma. Disc levels: Intervertebral disc spaces are relatively maintained. No high-grade osseous spinal canal stenosis. Facet arthrosis at multiple levels. Foraminal stenosis most pronounced on the left at  C6-7 and C7-T1. Upper chest: Negative. Other: None. IMPRESSION: No CT evidence of acute intracranial abnormality. No acute fracture or traumatic malalignment of the cervical spine. No acute maxillofacial fracture. Laceration and soft tissue swelling over the right supraorbital ridge. Mild right periorbital soft tissue swelling. Electronically Signed   By: Denny Flack M.D.   On: 07/19/2023 16:59   CT HEAD WO CONTRAST Result Date: 07/19/2023 CLINICAL DATA:  Head trauma, fall induced by intoxication. Hit head on corner of furniture. EXAM: CT HEAD WITHOUT CONTRAST CT MAXILLOFACIAL WITHOUT CONTRAST CT CERVICAL SPINE WITHOUT CONTRAST TECHNIQUE: Multidetector CT imaging of the head, cervical spine, and  maxillofacial structures were performed using the standard protocol without intravenous contrast. Multiplanar CT image reconstructions of the cervical spine and maxillofacial structures were also generated. RADIATION DOSE REDUCTION: This exam was performed according to the departmental dose-optimization program which includes automated exposure control, adjustment of the mA and/or kV according to patient size and/or use of iterative reconstruction technique. COMPARISON:  CT head 05/22/2020. FINDINGS: CT HEAD FINDINGS Brain: No acute intracranial hemorrhage. No CT evidence of acute infarct. No edema, mass effect, or midline shift. The basilar cisterns are patent. Ventricles: The ventricles are normal. Vascular: No hyperdense vessel or unexpected calcification. Skull: No acute or aggressive finding. Other: Mastoid air cells are clear. CT MAXILLOFACIAL FINDINGS Osseous: Maxilla: Intact Pterygoid Plates: Intact Zygomatic Arch: Intact Orbits: Intact Ethmoid: Intact Sphenoid: Intact Frontal:Intact Mandible: Intact. No condylar dislocation. Nasal: Intact Nasal Septum: Intact.  Rightward bowing of the osseous nasal septum. Orbits: Globes are intact. Lenses are normally located. Normal appearance of the extraocular muscles and optic nerve sheath complexes. Normal appearance of the intraorbital fat. There is soft tissue swelling over the right supraorbital ridge with skin irregularity compatible with laceration. Soft tissue swelling extends into the right preseptal soft tissues along the superior aspect of the right orbit. Sinuses: No significant mucosal thickening.  No air-fluid levels. Soft tissues: Soft tissue swelling and laceration over the right supraorbital ridge. CT CERVICAL SPINE FINDINGS Alignment: Cervical lordosis is maintained. No significant listhesis. No facet subluxation or dislocation. Skull base and vertebrae: No acute fracture. No primary bone lesion or focal pathologic process. Soft tissues and spinal  canal: No prevertebral fluid or swelling. No visible canal hematoma. Disc levels: Intervertebral disc spaces are relatively maintained. No high-grade osseous spinal canal stenosis. Facet arthrosis at multiple levels. Foraminal stenosis most pronounced on the left at C6-7 and C7-T1. Upper chest: Negative. Other: None. IMPRESSION: No CT evidence of acute intracranial abnormality. No acute fracture or traumatic malalignment of the cervical spine. No acute maxillofacial fracture. Laceration and soft tissue swelling over the right supraorbital ridge. Mild right periorbital soft tissue swelling. Electronically Signed   By: Denny Flack M.D.   On: 07/19/2023 16:59   CT CERVICAL SPINE WO CONTRAST Result Date: 07/19/2023 CLINICAL DATA:  Head trauma, fall induced by intoxication. Hit head on corner of furniture. EXAM: CT HEAD WITHOUT CONTRAST CT MAXILLOFACIAL WITHOUT CONTRAST CT CERVICAL SPINE WITHOUT CONTRAST TECHNIQUE: Multidetector CT imaging of the head, cervical spine, and maxillofacial structures were performed using the standard protocol without intravenous contrast. Multiplanar CT image reconstructions of the cervical spine and maxillofacial structures were also generated. RADIATION DOSE REDUCTION: This exam was performed according to the departmental dose-optimization program which includes automated exposure control, adjustment of the mA and/or kV according to patient size and/or use of iterative reconstruction technique. COMPARISON:  CT head 05/22/2020. FINDINGS: CT HEAD FINDINGS Brain: No acute intracranial hemorrhage. No CT evidence  of acute infarct. No edema, mass effect, or midline shift. The basilar cisterns are patent. Ventricles: The ventricles are normal. Vascular: No hyperdense vessel or unexpected calcification. Skull: No acute or aggressive finding. Other: Mastoid air cells are clear. CT MAXILLOFACIAL FINDINGS Osseous: Maxilla: Intact Pterygoid Plates: Intact Zygomatic Arch: Intact Orbits: Intact  Ethmoid: Intact Sphenoid: Intact Frontal:Intact Mandible: Intact. No condylar dislocation. Nasal: Intact Nasal Septum: Intact.  Rightward bowing of the osseous nasal septum. Orbits: Globes are intact. Lenses are normally located. Normal appearance of the extraocular muscles and optic nerve sheath complexes. Normal appearance of the intraorbital fat. There is soft tissue swelling over the right supraorbital ridge with skin irregularity compatible with laceration. Soft tissue swelling extends into the right preseptal soft tissues along the superior aspect of the right orbit. Sinuses: No significant mucosal thickening.  No air-fluid levels. Soft tissues: Soft tissue swelling and laceration over the right supraorbital ridge. CT CERVICAL SPINE FINDINGS Alignment: Cervical lordosis is maintained. No significant listhesis. No facet subluxation or dislocation. Skull base and vertebrae: No acute fracture. No primary bone lesion or focal pathologic process. Soft tissues and spinal canal: No prevertebral fluid or swelling. No visible canal hematoma. Disc levels: Intervertebral disc spaces are relatively maintained. No high-grade osseous spinal canal stenosis. Facet arthrosis at multiple levels. Foraminal stenosis most pronounced on the left at C6-7 and C7-T1. Upper chest: Negative. Other: None. IMPRESSION: No CT evidence of acute intracranial abnormality. No acute fracture or traumatic malalignment of the cervical spine. No acute maxillofacial fracture. Laceration and soft tissue swelling over the right supraorbital ridge. Mild right periorbital soft tissue swelling. Electronically Signed   By: Denny Flack M.D.   On: 07/19/2023 16:59    Procedures .Laceration Repair  Date/Time: 07/19/2023 5:19 PM  Performed by: Dorenda Gandy, MD Authorized by: Dorenda Gandy, MD   Consent:    Consent obtained:  Verbal   Consent given by:  Spouse   Risks, benefits, and alternatives were discussed: yes     Risks discussed:   Pain, infection, poor cosmetic result, need for additional repair, poor wound healing and vascular damage   Alternatives discussed:  Delayed treatment Universal protocol:    Immediately prior to procedure, a time out was called: yes     Patient identity confirmation method: Wife at bedside and demographic information. Anesthesia:    Anesthesia method:  Local infiltration   Local anesthetic:  Lidocaine  1% w/o epi Laceration details:    Location:  Face   Face location:  R upper eyelid   Extent:  Full thickness   Length (cm):  15   Depth (mm):  5 Pre-procedure details:    Preparation:  Patient was prepped and draped in usual sterile fashion and imaging obtained to evaluate for foreign bodies Exploration:    Limited defect created (wound extended): yes (small flap at lateral edge w avulsed injury)     Hemostasis achieved with:  Direct pressure   Imaging outcome: foreign body not noted (ct)     Contaminated: no   Treatment:    Wound cleansed with: sterile water.   Irrigation solution:  Sterile water   Irrigation volume:  250   Visualized foreign bodies/material removed: no     Debridement:  Minimal   Undermining:  None   Scar revision: no   Skin repair:    Repair method:  Sutures   Suture size:  5-0   Suture material:  Nylon   Suture technique:  Simple interrupted   Number of sutures:  10 Approximation:    Approximation:  Close Repair type:    Repair type:  Complex Post-procedure details:    Dressing:  Bulky dressing and sterile dressing   Procedure completion:  Tolerated Comments:     2 individual lacerations both on the eye lid.  Medial laceration extending into the lateral aspect of the nasal bridge.  Lateral laceration into the brow with a flap, an avulsed portion, and a second smaller flap requiring debridement.     Medications Ordered in ED Medications  lidocaine  (PF) (XYLOCAINE ) 1 % injection 30 mL (has no administration in time range)    ED Course/ Medical  Decision Making/ A&P                                 Medical Decision Making Adult male presents after mechanical fall with lacerations, is clinically intoxicated.  Given head and neck trauma, CT scans ordered, labs ordered.  Patient placed on monitoring, pulse ox 99% room air normal cardiac 80 sinus normal  Amount and/or Complexity of Data Reviewed Independent Historian: spouse and EMS Labs: ordered. Decision-making details documented in ED Course. Radiology: ordered and independent interpretation performed. Decision-making details documented in ED Course. ECG/medicine tests: ordered and independent interpretation performed. Decision-making details documented in ED Course.  Risk Prescription drug management. Decision regarding hospitalization. Diagnosis or treatment significantly limited by social determinants of health.   5:18 PM Patient tolerated suture repair unremarkably.  See details above.  At bedside, case discussed again with the patient's wife who is one of our phlebotomists. She notes that the patient has had similar episodes of acute intoxication in the past, but has recently made threatening statements towards her, and reportedly did so to EMS individuals as well.  With concern for homicidal intent, acute alcohol intoxication, she completed involuntary commitment papers with the magistrate.  Patient still clinically intoxicated, will require behavioral health evaluation once sober.  CIWA protocol ordered for patient.        Final Clinical Impression(s) / ED Diagnoses Final diagnoses:  Acute alcoholic intoxication with delirium [facility])  Fall, initial encounter  Injury of head, initial encounter  Facial laceration, initial encounter     Dorenda Gandy, MD 07/19/23 1727

## 2023-07-20 ENCOUNTER — Inpatient Hospital Stay (HOSPITAL_COMMUNITY)
Admission: AD | Admit: 2023-07-20 | Discharge: 2023-07-26 | DRG: 897 | Disposition: A | Discharge status: Home / Self Care | Source: Intra-hospital | Attending: Psychiatry | Admitting: Psychiatry

## 2023-07-20 DIAGNOSIS — F10921 Alcohol use, unspecified with intoxication delirium: Secondary | ICD-10-CM | POA: Diagnosis not present

## 2023-07-20 DIAGNOSIS — F1994 Other psychoactive substance use, unspecified with psychoactive substance-induced mood disorder: Principal | ICD-10-CM | POA: Diagnosis present

## 2023-07-20 DIAGNOSIS — E785 Hyperlipidemia, unspecified: Secondary | ICD-10-CM | POA: Diagnosis present

## 2023-07-20 DIAGNOSIS — R296 Repeated falls: Secondary | ICD-10-CM | POA: Diagnosis present

## 2023-07-20 DIAGNOSIS — F1024 Alcohol dependence with alcohol-induced mood disorder: Principal | ICD-10-CM | POA: Diagnosis present

## 2023-07-20 DIAGNOSIS — R45851 Suicidal ideations: Secondary | ICD-10-CM | POA: Diagnosis present

## 2023-07-20 DIAGNOSIS — I10 Essential (primary) hypertension: Secondary | ICD-10-CM | POA: Diagnosis not present

## 2023-07-20 DIAGNOSIS — F10239 Alcohol dependence with withdrawal, unspecified: Secondary | ICD-10-CM | POA: Diagnosis not present

## 2023-07-20 DIAGNOSIS — Y908 Blood alcohol level of 240 mg/100 ml or more: Secondary | ICD-10-CM | POA: Diagnosis present

## 2023-07-20 DIAGNOSIS — F332 Major depressive disorder, recurrent severe without psychotic features: Secondary | ICD-10-CM | POA: Diagnosis present

## 2023-07-20 DIAGNOSIS — F1020 Alcohol dependence, uncomplicated: Secondary | ICD-10-CM | POA: Diagnosis not present

## 2023-07-20 DIAGNOSIS — D539 Nutritional anemia, unspecified: Secondary | ICD-10-CM | POA: Diagnosis present

## 2023-07-20 DIAGNOSIS — E039 Hypothyroidism, unspecified: Secondary | ICD-10-CM | POA: Diagnosis present

## 2023-07-20 DIAGNOSIS — Z79899 Other long term (current) drug therapy: Secondary | ICD-10-CM

## 2023-07-20 DIAGNOSIS — S0181XA Laceration without foreign body of other part of head, initial encounter: Secondary | ICD-10-CM | POA: Diagnosis not present

## 2023-07-20 DIAGNOSIS — F17210 Nicotine dependence, cigarettes, uncomplicated: Secondary | ICD-10-CM | POA: Diagnosis present

## 2023-07-20 DIAGNOSIS — S01111A Laceration without foreign body of right eyelid and periocular area, initial encounter: Secondary | ICD-10-CM | POA: Diagnosis not present

## 2023-07-20 DIAGNOSIS — F29 Unspecified psychosis not due to a substance or known physiological condition: Secondary | ICD-10-CM | POA: Diagnosis not present

## 2023-07-20 DIAGNOSIS — E876 Hypokalemia: Secondary | ICD-10-CM | POA: Diagnosis not present

## 2023-07-20 MED ORDER — IBUPROFEN 800 MG PO TABS
800.0000 mg | ORAL_TABLET | Freq: Once | ORAL | Status: AC
  Administered 2023-07-20: 800 mg via ORAL
  Filled 2023-07-20: qty 1
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
## 2023-07-20 NOTE — ED Notes (Signed)
 Patient's wife, Benigno Brakeman, notified of transfer to [facility].

## 2023-07-20 NOTE — ED Notes (Signed)
 Called to patients room by sitter. Patient c/o his tremor getting worse and feeling a little anxious. CIWA updated. Medicated per order.

## 2023-07-20 NOTE — ED Notes (Signed)
 Pt states he remembers going up the stairs yesterday then his wife "freaking out" and remembers nothing else until being here now.

## 2023-07-20 NOTE — ED Notes (Signed)
 Wife called and wanted to speak with pt. Informed wife of visitation hours between 0830-0900, 01-1229, and/or 1700-1730.

## 2023-07-20 NOTE — ED Notes (Signed)
 Pt wife in room with pt at this time.

## 2023-07-20 NOTE — ED Notes (Signed)
 Wife states pt told her he took Amitriptyline  before having his alcohol recently and that is how he ended up in the ED. Wife states that pt has in fact had episodes before where he took some type of medication prior to drinking which led to greater intoxication. She states she was worried that he had lorazepam  sitting around but instead it was Amitriptyline  this time.

## 2023-07-20 NOTE — ED Provider Notes (Signed)
 Emergency Medicine Observation Re-evaluation Note  Christian King is a 53 y.o. male, seen on rounds today.  Pt initially presented to the ED for complaints of Fall Currently, the patient is watching television.  No acute concerns per nursing staff.  Patient came to the ER yesterday after being intoxicated, had a laceration that was repaired.  He was making threatening comments to the wife, psychiatry team has assessed the patient and recommended inpatient admission.  Physical Exam  BP 134/76   Pulse 76   Temp 97.7 F (36.5 C) (Oral)   Resp 18   Ht 5\' 7"  (1.702 m)   Wt 79.4 kg   SpO2 97%   BMI 27.41 kg/m  Physical Exam General: No acute distress Cardiac: Regular rate Lungs: No respiratory distress Psych: Currently calm  ED Course / MDM  EKG:EKG Interpretation Date/Time:  Friday Jul 19 2023 14:08:20 EDT Ventricular Rate:  76 PR Interval:  137 QRS Duration:  85 QT Interval:  371 QTC Calculation: 418 R Axis:   81  Text Interpretation: Sinus rhythm Low voltage, precordial leads Nonspecific T abnormalities, lateral leads Baseline wander in lead(s) I II aVR Confirmed by Dorenda Gandy [phone]) on 07/19/2023 2:17:00 PM  I have reviewed the labs performed to date as well as medications administered while in observation.  Recent changes in the last 24 hours include -no new changes.  Patient awaiting psychiatric admission.  Plan  Current plan is for holding patient in the ER for psychiatric admission.    Christian Face, MD 07/20/23 [phone]

## 2023-07-21 ENCOUNTER — Other Ambulatory Visit: Payer: Self-pay

## 2023-07-21 ENCOUNTER — Encounter (HOSPITAL_COMMUNITY): Payer: Self-pay | Admitting: Psychiatry

## 2023-07-21 DIAGNOSIS — E785 Hyperlipidemia, unspecified: Secondary | ICD-10-CM | POA: Diagnosis present

## 2023-07-21 DIAGNOSIS — F1020 Alcohol dependence, uncomplicated: Secondary | ICD-10-CM | POA: Diagnosis present

## 2023-07-21 DIAGNOSIS — E876 Hypokalemia: Secondary | ICD-10-CM | POA: Diagnosis present

## 2023-07-21 DIAGNOSIS — F1994 Other psychoactive substance use, unspecified with psychoactive substance-induced mood disorder: Principal | ICD-10-CM

## 2023-07-21 HISTORY — DX: Alcohol dependence, uncomplicated: F10.20

## 2023-07-21 LAB — LIPID PANEL
Cholesterol: 215 mg/dL — ABNORMAL HIGH (ref 0–200)
HDL: >135 mg/dL (ref >40)
Triglycerides: 87 mg/dL (ref <150)
VLDL: 17 mg/dL (ref 0–40)

## 2023-07-21 LAB — VITAMIN B12: Vitamin B-12: 394 pg/mL (ref 180–914)

## 2023-07-21 LAB — COMPREHENSIVE METABOLIC PANEL WITH GFR
ALT: 28 U/L (ref 0–44)
AST: 32 U/L (ref 15–41)
Albumin: 4.6 g/dL (ref 3.5–5.0)
Alkaline Phosphatase: 76 U/L (ref 38–126)
Anion gap: 15 (ref 5–15)
BUN: 11 mg/dL (ref 6–20)
CO2: 25 mmol/L (ref 22–32)
Calcium: 10.6 mg/dL — ABNORMAL HIGH (ref 8.9–10.3)
Chloride: 96 mmol/L — ABNORMAL LOW (ref 98–111)
Creatinine, Ser: 0.78 mg/dL (ref 0.61–1.24)
GFR, Estimated: >60 mL/min (ref >60)
Glucose, Bld: 116 mg/dL — ABNORMAL HIGH (ref 70–99)
Potassium: 2.9 mmol/L — ABNORMAL LOW (ref 3.5–5.1)
Sodium: 136 mmol/L (ref 135–145)
Total Bilirubin: 1.2 mg/dL (ref 0.0–1.2)
Total Protein: 8.7 g/dL — ABNORMAL HIGH (ref 6.5–8.1)

## 2023-07-21 LAB — VITAMIN D 25 HYDROXY (VIT D DEFICIENCY, FRACTURES): Vit D, 25-Hydroxy: 21.85 ng/mL — ABNORMAL LOW (ref 30–100)

## 2023-07-21 LAB — RAPID URINE DRUG SCREEN, HOSP PERFORMED
Amphetamines: NOT DETECTED
Barbiturates: NOT DETECTED
Benzodiazepines: POSITIVE — AB
Cocaine: NOT DETECTED
Opiates: NOT DETECTED
Tetrahydrocannabinol: NOT DETECTED

## 2023-07-21 LAB — FOLATE: Folate: 13 ng/mL (ref >5.9)

## 2023-07-21 LAB — HEMOGLOBIN A1C
Hgb A1c MFr Bld: 5.3 % (ref 4.8–5.6)
Mean Plasma Glucose: 105.41 mg/dL

## 2023-07-21 LAB — TSH: TSH: 4.522 u[IU]/mL — ABNORMAL HIGH (ref 0.350–4.500)

## 2023-07-21 MED ORDER — ACETAMINOPHEN 325 MG PO TABS
650.0000 mg | ORAL_TABLET | Freq: Four times a day (QID) | ORAL | Status: DC | PRN
  Administered 2023-07-21 – 2023-07-25 (×4): 650 mg via ORAL
  Filled 2023-07-21 (×4): qty 2

## 2023-07-21 MED ORDER — OLANZAPINE 10 MG IM SOLR: 10.0000 mg | Freq: Three times a day (TID) | INTRAMUSCULAR | Status: DC | PRN

## 2023-07-21 MED ORDER — NALTREXONE HCL 50 MG PO TABS
25.0000 mg | ORAL_TABLET | Freq: Every day | ORAL | Status: DC
  Administered 2023-07-21 – 2023-07-26 (×6): 25 mg via ORAL
  Filled 2023-07-21 (×6): qty 1

## 2023-07-21 MED ORDER — LORAZEPAM 1 MG PO TABS
1.0000 mg | ORAL_TABLET | ORAL | Status: DC | PRN
  Administered 2023-07-21: 2 mg via ORAL
  Administered 2023-07-21: 1 mg via ORAL
  Administered 2023-07-22: 2 mg via ORAL
  Administered 2023-07-22 – 2023-07-23 (×2): 1 mg via ORAL
  Filled 2023-07-21 (×3): qty 1
  Filled 2023-07-21: qty 2
  Filled 2023-07-21 (×2): qty 1

## 2023-07-21 MED ORDER — METOPROLOL SUCCINATE ER 50 MG PO TB24
50.0000 mg | ORAL_TABLET | Freq: Every day | ORAL | Status: DC
  Administered 2023-07-21 – 2023-07-26 (×6): 50 mg via ORAL
  Filled 2023-07-21 (×6): qty 1

## 2023-07-21 MED ORDER — MAGNESIUM OXIDE -MG SUPPLEMENT 400 (240 MG) MG PO TABS
400.0000 mg | ORAL_TABLET | Freq: Every day | ORAL | Status: DC
  Administered 2023-07-22 – 2023-07-25 (×4): 400 mg via ORAL
  Filled 2023-07-21 (×4): qty 1

## 2023-07-21 MED ORDER — SIMVASTATIN 20 MG PO TABS
40.0000 mg | ORAL_TABLET | Freq: Every day | ORAL | Status: DC
  Administered 2023-07-21 – 2023-07-25 (×5): 40 mg via ORAL
  Filled 2023-07-21 (×5): qty 2

## 2023-07-21 MED ORDER — OLANZAPINE 5 MG PO TBDP
5.0000 mg | ORAL_TABLET | Freq: Three times a day (TID) | ORAL | Status: DC | PRN
  Administered 2023-07-22 – 2023-07-24 (×2): 5 mg via ORAL
  Filled 2023-07-21 (×2): qty 1

## 2023-07-21 MED ORDER — FOLIC ACID 1 MG PO TABS
1.0000 mg | ORAL_TABLET | Freq: Every day | ORAL | Status: DC
  Administered 2023-07-21 – 2023-07-26 (×6): 1 mg via ORAL
  Filled 2023-07-21 (×6): qty 1

## 2023-07-21 MED ORDER — TRAZODONE HCL 50 MG PO TABS
50.0000 mg | ORAL_TABLET | Freq: Every evening | ORAL | Status: DC | PRN
  Administered 2023-07-23 – 2023-07-25 (×3): 50 mg via ORAL
  Filled 2023-07-21 (×4): qty 1

## 2023-07-21 MED ORDER — ALUM & MAG HYDROXIDE-SIMETH 200-200-20 MG/5ML PO SUSP: 30.0000 mL | ORAL | Status: DC | PRN

## 2023-07-21 MED ORDER — VITAMIN B-12 1000 MCG PO TABS
1000.0000 ug | ORAL_TABLET | Freq: Every day | ORAL | Status: DC
  Administered 2023-07-21 – 2023-07-25 (×5): 1000 ug via ORAL
  Filled 2023-07-21 (×5): qty 1

## 2023-07-21 MED ORDER — ADULT MULTIVITAMIN W/MINERALS CH
1.0000 | ORAL_TABLET | Freq: Every day | ORAL | Status: DC
Start: 2023-07-21 — End: 2023-07-23
  Administered 2023-07-21 – 2023-07-23 (×3): 1 via ORAL
  Filled 2023-07-21 (×3): qty 1

## 2023-07-21 MED ORDER — MAGNESIUM HYDROXIDE 400 MG/5ML PO SUSP: 30.0000 mL | Freq: Every day | ORAL | Status: DC | PRN

## 2023-07-21 MED ORDER — THIAMINE HCL 100 MG/ML IJ SOLN: 100.0000 mg | Freq: Every day | INTRAMUSCULAR | Status: DC

## 2023-07-21 MED ORDER — POTASSIUM CHLORIDE CRYS ER 10 MEQ PO TBCR
10.0000 meq | EXTENDED_RELEASE_TABLET | Freq: Every day | ORAL | Status: DC
  Administered 2023-07-21 – 2023-07-26 (×6): 10 meq via ORAL
  Filled 2023-07-21 (×6): qty 1

## 2023-07-21 MED ORDER — VITAMIN B-1 100 MG PO TABS
100.0000 mg | ORAL_TABLET | Freq: Every day | ORAL | Status: DC
  Administered 2023-07-21 – 2023-07-23 (×3): 100 mg via ORAL
  Filled 2023-07-21 (×3): qty 1

## 2023-07-21 MED ORDER — OLANZAPINE 10 MG IM SOLR: 5.0000 mg | Freq: Three times a day (TID) | INTRAMUSCULAR | Status: DC | PRN

## 2023-07-21 NOTE — Plan of Care (Signed)
   Problem: Education: Goal: Emotional status will improve Outcome: Progressing
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
## 2023-07-21 NOTE — Progress Notes (Signed)
 Patient was transferred from [facility]. He reports that he had 2 bottles of wine and accidentally fell and hit his head on the corner of their entertainment system which required him to have stitches, He was cooperative during the admission but very unsteady on his feet. He was fidgety and impulsive having to be reminded several times to be careful not to fall. 1:1 due to his increased risk for falling.

## 2023-07-21 NOTE — Group Note (Signed)
 Date:  07/21/2023 Time:  5:57 PM  Group Topic/Focus:  Rediscovering Joy:   The focus of this group is to explore various ways to relieve stress in a positive manner. (Patients created a 'life collage')   Cultivating creativity can help synthesize knowledge, experiences, frustrations, and support our mental health   Participation Level:  Active  Participation Quality:  Appropriate and Attentive  Affect:  Appropriate  Cognitive:  Appropriate  Insight: Appropriate and Good  Engagement in Group:  Engaged and Improving  Modes of Intervention:  Activity, Exploration, Rapport Building, Socialization, and Support  Additional Comments:    Victorine Grater 07/21/2023, 5:57 PM

## 2023-07-21 NOTE — Progress Notes (Addendum)
 D. Pt observed sitting in the dayroom, in wheelchair, eating a snack with 1:1 sitter beside him. Pt presents tremulous, unsteady, but denies other withdrawal symptoms- rates anxiety a 1. . Pt currently denies SI/HI and A/VH. A. Labs and vitals monitored. Pt supported emotionally and encouraged to express concerns and ask questions.   R. Pt remains safe with 15 minute checks, and 1:1 sitter. Will continue POC.

## 2023-07-21 NOTE — Plan of Care (Signed)
  Problem: Education: Goal: Mental status will improve Outcome: Not Progressing   Problem: Activity: Goal: Sleeping patterns will improve Outcome: Not Progressing   Problem: Coping: Goal: Ability to demonstrate self-control will improve Outcome: Not Progressing

## 2023-07-21 NOTE — Plan of Care (Signed)
   Problem: Education: Goal: Knowledge of [facility] Education information/materials will improve Outcome: Progressing Goal: Verbalization of understanding the information provided will improve Outcome: Progressing

## 2023-07-21 NOTE — Group Note (Signed)
 Date:  07/21/2023 Time:  1:19 PM  Group Topic/Focus:   Setbacks in Recovery:   The focus of this group is to identify unhealthy thought patterns and develop a plan to handle them in a healthier way upon discharge.    Participation Level:  Minimal  Participation Quality:  Appropriate  Affect:  Appropriate  Cognitive:  Appropriate  Insight: Appropriate  Engagement in Group:  Developing/Improving  Modes of Intervention:  Discussion and Education  Additional Comments:    JAHLEN BOLLMAN 07/21/2023, 1:19 PM

## 2023-07-21 NOTE — Progress Notes (Signed)
   07/21/23 1800  Psych Admission Type (Psych Patients Only)  Admission Status Involuntary  Psychosocial Assessment  Patient Complaints Substance abuse  Facial Expression Anxious  Affect Anxious  Speech Logical/coherent  Interaction Assertive  Motor Activity Fidgety;Tremors  Appearance/Hygiene Disheveled  Behavior Characteristics Cooperative;Fidgety  Mood Pleasant;Anxious  Thought Process  Coherency Concrete thinking  Content Preoccupation  Delusions None reported or observed  Perception WDL  Hallucination None reported or observed  Judgment Impaired  Confusion Mild  Danger to Self  Current suicidal ideation? Denies  Danger to Others  Danger to Others None reported or observed

## 2023-07-21 NOTE — H&P (Signed)
 Psychiatric Admission Assessment Adult  Patient Identification: Christian King  MRN:  [id number]  Date of Evaluation:  07/21/23  Chief Complaint:  MDD (major depressive disorder), recurrent episode, severe (HCC) [F33.2]   Principal Diagnosis: Substance induced mood disorder (HCC)  Diagnosis:  Principal Problem:   Substance induced mood disorder (HCC) Active Problems:   Severe alcohol use disorder (HCC)   Chronic hypokalemia   Hyperlipidemia    Chief Complaint: "That (points to stitches above his right eyebrow)"   History of Present Illness: Christian King is a 53 y.o. who  has a past medical history of Alcohol abuse (07/24/2010), B12 deficiency, Blood dyscrasia, BMI 25.0-25.9,adult (01/28/2015), Bronchitis, Degenerative joint disease (DJD) of hip, Degenerative joint disease of left hip (03/16/2016), Depression, Essential hypertension (04/26/2010), GERD (gastroesophageal reflux disease), Gout, Gout (04/26/2010), Hemochromatosis (10/03/2013), History of DVT (deep vein thrombosis) (05/15/2016), History of kidney stones, HLD (hyperlipidemia), HTN (hypertension), Hyperlipidemia (04/26/2010), Hypokalemia (03/17/2016), Hypothyroidism, Insomnia (02/05/2014), QT prolongation (03/20/2015), Severe alcohol use disorder (HCC) (07/21/2023), Ventricular tachycardia (HCC), Vitamin B12 deficiency (04/26/2010), and Wears glasses.  He presented to [facility] for Substance induced mood disorder (HCC).  He threatened to shoot his wife, shoot EMS, and shoot himself while intoxicated.  His blood alcohol level was noted to be 351 on admission.  The patient appears to be a poor and unreliable historian.  Per [facility] Christian King, Christian King is a 53 year old male who presents to [facility] under IVC after a reported fall and head injury. Patient states he has limited recollection of what transpired. He reports that he only recalls drinking 2 bottles of wine, going into the basement, falling and waking up with his head  bleeding. He states he does not know how he fell, he just remembers waking up and his wife reporting that she called 911 due to the head injury. 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 He states he has been consuming about 4-5 beers daily, but decided to drink wine instead tonight which he believes is what caused the disorientation. Patient is a poor historian, reporting he does not recall much from the incident tonight but he does remember arguing with his wife. He denies claims in the IVC. He denies homocidal ideation, he denies plan or intent to harm others. He does report that he has a few rifles that are secured and a handgun in his night stand. Per IVC patient threatened to shoot EMS and his wife and then himself. IVC also reported aggressive behavior in the home "respondent will break things in the home and throw things out the door". He reports increased irritability, decreased sleep and decreased appetite.He denies history of mental health diagnosis. He reports previous 3 week admission for substance use treatment in 2016. He reports occasional nicotine use. He denies any other substance use.He denies SI,paranoia,NSSIB and AVH. He denies hx of abuse or trauma. He denies current legal concerns.   The patient tells me he is here for a head injury.  He endorsed drinking 2 bottles of wine, but stated that he was not that intoxicated.  When confronted with his behavior in the emergency department and his blood alcohol level of 351 he stated well and may have also had 4 beers.  He minimized his drinking stating that he does not have a problem, despite the fact that he is on one-to-one observation and in a wheelchair due to unsteady gait after drinking himself into a stupor leading to a significant head  injury requiring a lot of stitches.  He tells me great many things that are inconsistent with doctors notes in the chart and his wife's report.  He denies having a problem  with alcohol.  He denied going to rehab in 2016.  He denied a family history of alcoholism.  He tells me that he needs to leave the hospital so he can go to work despite the fact that he has been out of work for 9 weeks drinking daily.  He does not recall threatening to murder his wife, EMS, or kill himself by gun.  He admits having access to multiple firearms.  He denies symptoms of depression, anxiety, alcohol abuse, or any other complaints.  Collateral was obtained from the patient's wife.  She states that he is a longtime problem alcoholic who has been out of work for 9 weeks because he is drinking daily.  She reports they are now in a financial crisis due to the patient's alcohol abuse.  She reports that this is not the first time the patient has gotten a significant injury while [place].  She states that he is broken his arm, his ankle, his thumb, and his scapula while intoxicated.  She reports that he requires chronic B12, magnesium , potassium, and thiamine  due to his alcoholism.  She states that his doctor is concerned that he will develop cirrhosis.  She reports that he has been so intoxicated that he had to be intubated after stopping breathing.  She states that he will often get his guns out and threatened to kill himself while intoxicated, and will shoot his guns outside when he is drunk.  She reports that this behavior is gotten worse since his alcoholic cousin committed suicide by gun.  She states that he mixes Ativan  and alcohol.  He states that she witnessed him fall and hit his head and was shocked he did not have a skull fracture or brain injury as he hit his head on the table with such force.  Will plan on continuing the patient's involuntary commitment.  Will start CIWA protocol, and keep him on one-to-one due to falls risk and poor insight into how unsteady he is on his feet.  Will restart home medications and naltrexone.  Will order routine intake labs.   Past Psychiatric History: He  has a  past medical history of Alcohol abuse (07/24/2010), B12 deficiency, Blood dyscrasia, BMI 25.0-25.9,adult (01/28/2015), Bronchitis, Degenerative joint disease (DJD) of hip, Degenerative joint disease of left hip (03/16/2016), Depression, Essential hypertension (04/26/2010), GERD (gastroesophageal reflux disease), Gout, Gout (04/26/2010), Hemochromatosis (10/03/2013), History of DVT (deep vein thrombosis) (05/15/2016), History of kidney stones, HLD (hyperlipidemia), HTN (hypertension), Hyperlipidemia (04/26/2010), Hypokalemia (03/17/2016), Hypothyroidism, Insomnia (02/05/2014), QT prolongation (03/20/2015), Severe alcohol use disorder (HCC) (07/21/2023), Ventricular tachycardia (HCC), Vitamin B12 deficiency (04/26/2010), and Wears glasses.  Inpatient treatment for alcohol in 2016.  Is the patient at risk to self?  Yes Has the patient been a risk to self in the past 6 months? No Has the patient been a risk to self within the distant past? No Is the patient a risk to others? No Has the patient been a risk to others in the past 6 months? No Has the patient been a risk to others within the distant past? No  [place] Scale:  Flowsheet Row [facility] ([facility]) [facility]19/2023 in [facility] ED to Hosp-Admission (Discharged) from 05/21/2020 in [place] [facility]  UNIT  C-SSRS RISK CATEGORY No Risk No Risk No Risk          Prior Inpatient Therapy: Yes Prior Outpatient Therapy: Denies  Alcohol Screening:  1. How often do you have a drink containing alcohol?: 2 to 3 times a week 2. How many drinks containing alcohol do you have on a typical day when you are drinking?: 10 or more 3. How often do you have six or more drinks on one occasion?: Weekly AUDIT-C Score: 10 4. How often during the last year have you found that you were not able to stop drinking once you had started?: Never 5. How  often during the last year have you failed to do what was normally expected from you because of drinking?: Never 6. How often during the last year have you needed a first drink in the morning to get yourself going after a heavy drinking session?: Never 7. How often during the last year have you had a feeling of guilt of remorse after drinking?: Never 8. How often during the last year have you been unable to remember what happened the night before because you had been drinking?: Never 9. Have you or someone else been injured as a result of your drinking?: No 10. Has a relative or friend or a doctor or another health worker been concerned about your drinking or suggested you cut down?: No Alcohol Use Disorder Identification Test Final Score (AUDIT): 10 Alcohol Brief Interventions/Follow-up: Alcohol education/Brief advice  Substance Abuse History in the last 12 months: Severe alcohol abuse Consequences of Substance Abuse: NA  Previous Psychotropic Medications: Yes Psychological Evaluations: No  Past Medical History:  Past Medical History:  Diagnosis Date   Alcohol abuse 07/24/2010   B12 deficiency    Blood dyscrasia    hemachromatosis   BMI 25.0-25.9,adult 01/28/2015   Bronchitis    Degenerative joint disease (DJD) of hip    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   Depression    Essential hypertension 04/26/2010   Qualifier: Diagnosis of   By: Nadean August         GERD (gastroesophageal reflux disease)    Gout    Gout 04/26/2010   Qualifier: Diagnosis of   By: Nadean August         Hemochromatosis 10/03/2013   History of DVT (deep vein thrombosis) 05/15/2016   History of kidney stones    HLD (hyperlipidemia)    HTN (hypertension)    Hyperlipidemia 04/26/2010   Qualifier: Diagnosis of   By: Nadean August         Hypokalemia 03/17/2016   Hypothyroidism    Insomnia 02/05/2014   QT prolongation 03/20/2015   Severe alcohol use disorder (HCC) 07/21/2023    Ventricular tachycardia (HCC)    in setting of hypokalemia/ hypomagnesemia and ETOH   Vitamin B12 deficiency 04/26/2010   Qualifier: Diagnosis of   By: Nadean August         Wears glasses      Family Psychiatric & Medical History: Cousin committed suicide by gun Family History  Problem Relation Age of Onset   Hypertension Father    Diabetes Maternal Grandmother    Hypertension Paternal Grandmother    Stroke Paternal Grandmother      Tobacco Screening:  Social History   Tobacco Use  Smoking Status Every Day   Current packs/day: 0.25   Average packs/day: 0.3 packs/day for 38.2 years (9.6 ttl pk-yrs)   Types: Cigarettes   Start date: 05/05/1985  Smokeless  Tobacco Never      Social History:  Social History   Substance and Sexual Activity  Alcohol Use Yes   Alcohol/week: 12.0 standard drinks of alcohol   Types: 12 Cans of beer per week   Comment: daily      Additional Social History: Marital status: Married Number of Years Married: 8 What types of issues is patient dealing with in the relationship?: arguments Additional relationship information: n/a Are you sexually active?: No What is your sexual orientation?: heterosexual Has your sexual activity been affected by drugs, alcohol, medication, or emotional stress?: the patient drinks alcohol Does patient have children?: Yes How many children?: 2 How is patient's relationship with their children?: the patient stated that its good  Patient states that he was born [place] in [place].  He reports he was raised by his parents and has 1 child.  He describes their relationship as good, but his wife states that the child will not have anything to do with him.  He states that he is close with his stepbrother and has no biological brothers or sisters.  Reports that his father is still living and his mother died of "old age".  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  He was married twice his first marriage lasted 21 years and he has been married to his second wife for 7 years.  The son is 61.  He denies a history of arrests or legal problems.  He denies a history of Financial planner.  Allergies:   Allergies  Allergen Reactions   Amoxicillin  Hives, Itching and Rash   Azithromycin  Other (See Comments)    History of QT prolongation   Hctz [Hydrochlorothiazide] Rash   Tramadol Itching     Lab Results:  Results for orders placed or performed during the hospital encounter of 07/19/23 (from the past 48 hours)  Ethanol     Status: Abnormal   Collection Time: 07/19/23  2:10 PM  Result Value Ref Range   Alcohol, Ethyl (B) 351 (HH) <15 mg/dL    Comment: CRITICAL RESULT CALLED TO, READ BACK BY AND VERIFIED WITH A. [facility] RN 07/19/23 @1435  BY J. WHITE (NOTE) For medical purposes only. Performed at [facility], [address] [place], [place] [address]   Comprehensive metabolic panel     Status: Abnormal   Collection Time: 07/19/23  2:13 PM  Result Value Ref Range   Sodium 136 135 - 145 mmol/L   Potassium 3.3 (L) 3.5 - 5.1 mmol/L   Chloride 93 (L) 98 - 111 mmol/L   CO2 23 22 - 32 mmol/L   Glucose, Bld 192 (H) 70 - 99 mg/dL    Comment: Glucose reference range applies only to samples taken after fasting for at least 8 hours.   BUN 9 6 - 20 mg/dL   Creatinine, Ser 5.78 0.61 - 1.24 mg/dL   Calcium  8.8 (L) 8.9 - 10.3 mg/dL   Total Protein 7.3 6.5 - 8.1 g/dL   Albumin 4.0 3.5 - 5.0 g/dL   AST 55 (H) 15 - 41 U/L   ALT 38 0 - 44 U/L   Alkaline Phosphatase 63 38 - 126 U/L   Total Bilirubin 0.6 0.0 - 1.2 mg/dL   GFR, Estimated >46 >96 mL/min    Comment: (NOTE) Calculated using the CKD-EPI Creatinine Equation (2021)  Anion gap 20 (H) 5 - 15    Comment: Performed at [facility], [address], [place], [place] [address]  CBC     Status: Abnormal   Collection Time:  07/19/23  2:13 PM  Result Value Ref Range   WBC 2.7 (L) 4.0 - 10.5 K/uL   RBC 4.08 (L) 4.22 - 5.81 MIL/uL   Hemoglobin 14.4 13.0 - 17.0 g/dL   HCT 60.4 54.0 - 98.1 %   MCV 99.3 80.0 - 100.0 fL   MCH 35.3 (H) 26.0 - 34.0 pg   MCHC 35.6 30.0 - 36.0 g/dL   RDW 19.1 47.8 - 29.5 %   Platelets 111 (L) 150 - 400 K/uL    Comment: Immature Platelet Fraction may be clinically indicated, consider ordering this additional test [id number] REPEATED TO VERIFY    nRBC 0.0 0.0 - 0.2 %    Comment: Performed at [facility], [address], [place], [place] [address]     Blood Alcohol level:  Lab Results  Component Value Date   ETH 351 [facility]) 07/19/2023   ETH <10 05/23/2020    Metabolic Disorder Labs:  No results found for: "HGBA1C", "MPG" No results found for: "PROLACTIN"  Lab Results  Component Value Date   CHOL 132 04/27/2022   TRIG 102 04/27/2022   HDL 48 04/27/2022   LDLCALC 65 04/27/2022   LDLCALC 32 11/13/2021      Current Medications: Current Facility-Administered Medications  Medication Dose Route Frequency Provider Last Rate Last Admin   acetaminophen  (TYLENOL ) tablet 650 mg  650 mg Oral Q6H PRN Gilman Lade, NP   650 mg at 07/21/23 6295   alum & mag hydroxide-simeth (MAALOX/MYLANTA) 200-200-20 MG/5ML suspension 30 mL  30 mL Oral Q4H PRN Gilman Lade, NP       cyanocobalamin  (VITAMIN B12) tablet 1,000 mcg  1,000 mcg Oral QHS [name], MD       folic acid  (FOLVITE ) tablet 1 mg  1 mg Oral Daily Caley Ciaramitaro S, MD   1 mg at 07/21/23 1007   LORazepam  (ATIVAN ) tablet 1-4 mg  1-4 mg Oral Q1H PRN Gratia Disla S, MD       magnesium  hydroxide (MILK OF MAGNESIA) suspension 30 mL  30 mL Oral Daily PRN Lorrene Rosser, Veronique M, NP       magnesium  oxide (MAG-OX) tablet 400 mg  400 mg Oral QHS Avri Paiva S, MD       metoprolol  succinate (TOPROL -XL) 24 hr tablet 50 mg  50 mg Oral Daily Eryanna Regal S, MD       multivitamin with minerals tablet 1 tablet  1 tablet  Oral Daily Torin Whisner S, MD   1 tablet at 07/21/23 1007   naltrexone (DEPADE) tablet 25 mg  25 mg Oral Daily Kadan Millstein S, MD       OLANZapine (ZYPREXA) injection 10 mg  10 mg Intramuscular TID PRN Byungura, Veronique M, NP       OLANZapine (ZYPREXA) injection 5 mg  5 mg Intramuscular TID PRN Lorrene Rosser, Veronique M, NP       OLANZapine zydis (ZYPREXA) disintegrating tablet 5 mg  5 mg Oral TID PRN Byungura, Veronique M, NP       potassium chloride  (KLOR-CON  M) CR tablet 10 mEq  10 mEq Oral Daily [name], MD       simvastatin  (ZOCOR ) tablet 40 mg  40 mg Oral q1800 [name], MD       thiamine  (Vitamin  B-1) tablet 100 mg  100 mg Oral Daily Yasemin Rabon S, MD   100 mg at 07/21/23 1006   traZODone (DESYREL) tablet 50 mg  50 mg Oral QHS PRN Byungura, Veronique M, NP        PTA Medications: Medications Prior to Admission  Medication Sig Dispense Refill Last Dose/Taking   MAGnesium -Oxide 400 (240 Mg) MG tablet Take 1 tablet by mouth once daily 90 tablet 1    metoprolol  succinate (TOPROL -XL) 50 MG 24 hr tablet Take 1 tablet (50 mg total) by mouth daily with or immediately following a meal. 90 tablet 1    potassium chloride  (KLOR-CON ) 10 MEQ tablet Take 1 tablet (10 mEq total) by mouth daily. 90 tablet 2    simvastatin  (ZOCOR ) 40 MG tablet Take 1 tablet (40 mg total) by mouth daily. (Patient not taking: Reported on 07/21/2023) 90 tablet 1 Not Taking   thiamine  (VITAMIN B1) 100 MG tablet Take 1 tablet (100 mg total) by mouth daily. 90 tablet 1      Musculoskeletal: Strength & Muscle Tone: within normal limits Gait & Station: normal Patient leans: N/A    Psychiatric Specialty Exam:  Presentation  General Appearance: Disheveled  Eye Contact: Fair  Speech: Normal Rate  Speech Volume: Increased  Handedness: Right   Mood and Affect  Mood: Depressed; Anxious; Irritable  Affect: Restricted; Congruent   Thought Process  Thought Processes: Disorganized  Descriptions  of Associations: Intact  Orientation: Partial  Thought Content: Logical; Scattered  History of Schizophrenia/Schizoaffective disorder: No  Duration of Psychotic Symptoms: NA Hallucinations: Hallucinations: None  Ideas of Reference: None  Suicidal Thoughts: Suicidal Thoughts: No  Homicidal Thoughts: Homicidal Thoughts: No   Sensorium  Memory: Immediate Good; Recent Good  Judgment: Poor  Insight: Poor   Executive Functions  Concentration: Fair  Attention Span: Fair  Recall: Good  Fund of Knowledge: Good  Language: Good   Psychomotor Activity  Psychomotor Activity: Psychomotor Activity: Restlessness   Assets  Assets: Communication Skills; Desire for Improvement; Housing   Sleep  Sleep: Sleep: Fair    Physical Exam: General: Sitting comfortably. NAD. HEENT: Normocephalic, atraumatic, MMM, EMOI Lungs: no increased work of breathing noted Heart: no cyanosis Abdomen: Non distended Musculoskeletal: FROM. No obvious deformities Skin: Warm, dry, intact. No rashes noted Neuro: No obvious focal deficits.  Gait and station are normal  Review of Systems  Constitutional: Negative.   HENT: Negative.    Eyes: Negative.   Respiratory: Negative.    Cardiovascular: Negative.   Gastrointestinal: Negative.   Genitourinary: Negative.   Skin: Negative.   Neurological: Gait instability.   Psychiatric/Behavioral:  Positive for poor insight.     Blood pressure 118/86, pulse (!) 117, temperature (!) 97.5 F (36.4 C), temperature source Oral, resp. rate 20, height 5\' 6"  (1.676 m), weight 71.7 kg, SpO2 100%. Body mass index is 25.5 kg/m.   Treatment Plan Summary: ASSESSMENT: PAL SHELL is an 53 y.o. male who  has a past medical history of Alcohol abuse (07/24/2010), B12 deficiency, Blood dyscrasia, BMI 25.0-25.9,adult (01/28/2015), Bronchitis, Degenerative joint disease (DJD) of hip, Degenerative joint disease of left hip (03/16/2016), Depression, Essential  hypertension (04/26/2010), GERD (gastroesophageal reflux disease), Gout, Gout (04/26/2010), Hemochromatosis (10/03/2013), History of DVT (deep vein thrombosis) (05/15/2016), History of kidney stones, HLD (hyperlipidemia), HTN (hypertension), Hyperlipidemia (04/26/2010), Hypokalemia (03/17/2016), Hypothyroidism, Insomnia (02/05/2014), QT prolongation (03/20/2015), Severe alcohol use disorder (HCC) (07/21/2023), Ventricular tachycardia (HCC), Vitamin B12 deficiency (04/26/2010), and Wears glasses.  He presented on 07/20/2023 11:00 PM for  Substance induced mood disorder (HCC).  He threatened to shoot his wife, shoot EMS, and shoot himself with a gun.  He has access to multiple firearms.  Diagnoses / Active Problems: Patient Active Problem List   Diagnosis Date Noted   Substance induced mood disorder (HCC) 07/21/2023   Severe alcohol use disorder (HCC) 07/21/2023   Chronic hypokalemia 07/21/2023   Hyperlipidemia 07/21/2023     PLAN: Safety and Monitoring:  -- Involuntary admission to inpatient psychiatric unit for safety, stabilization and treatment  -- Daily contact with patient to assess and evaluate symptoms and progress in treatment  -- Patient's case to be discussed in multi-disciplinary team meeting  -- Observation Level : q15 minute checks  -- Vital signs:  q12 hours  -- Precautions: suicide, elopement, and assault  2. Psychiatric Diagnoses and Treatment:  Patient Active Problem List   Diagnosis Date Noted   Substance induced mood disorder (HCC) 07/21/2023   Severe alcohol use disorder (HCC) 07/21/2023   Chronic hypokalemia 07/21/2023   Hyperlipidemia 07/21/2023     Scheduled Medications:  vitamin B-12  1,000 mcg Oral QHS   folic acid   1 mg Oral Daily   magnesium  oxide  400 mg Oral QHS   metoprolol  succinate  50 mg Oral Daily   multivitamin with minerals  1 tablet Oral Daily   naltrexone  25 mg Oral Daily   potassium chloride   10 mEq Oral Daily   simvastatin   40 mg Oral q1800    thiamine   100 mg Oral Daily     As Needed Medications: acetaminophen , alum & mag hydroxide-simeth, LORazepam , magnesium  hydroxide, OLANZapine, OLANZapine, OLANZapine zydis, traZODone    3. Medical Issues Being Addressed:    Labs reviewed, intake labs pending   Tobacco Use Disorder  --  Patient does not need nicotine replacement  -- Smoking cessation encouraged  4. Discharge Planning:   -- Social work and case management to assist with discharge planning and identification of hospital follow-up needs prior to discharge  -- Estimated LOS: 5-7 days  -- Discharge Concerns: Need to establish a safety plan; Medication compliance and effectiveness  -- Discharge Goals: Return home with outpatient referrals for mental health follow-up including medication management/psychotherapy  5. Short Term Goals:  Improve ability to identify changes in lifestyle to reduce recurrence of condition, verbalize feelings, disclose and discuss suicidal ideas, demonstrate self-control, identify and develop effective coping behaviors, compliance with prescribed medications, identify triggers associated with substance abuse/mental health issues, participate in unit milieu and in scheduled group therapies   6. Long Term Goals: Improvement in symptoms so the patient is ready for discharge   --The risks/benefits/side-effects/alternatives to the medications above were discussed in detail with the patient and time was given for questions. The patient provided informed consent.   -- Metabolic profile and EKG monitoring obtained while on an atypical antipsychotic and listed in the EHR    Total Time Spent in Direct Patient Care:  I personally spent 60 minutes on the unit in direct patient care. The direct patient care time included face-to-face time with the patient, reviewing the patient's chart, communicating with other professionals, and coordinating care. Greater than 50% of this time was spent in counseling or  coordinating care with the patient regarding goals of hospitalization, psycho-education, and discharge planning needs.   I certify that inpatient services furnished can reasonably be expected to improve the patient's condition.    Clair Crews, MD 07/21/2023, 11:42 AM      Portions of this note were  created using voice recognition software. Minor syntax errors, grammatical content, spelling, or punctuation errors may have occurred unintentionally. Please notify the [facility] if the meaning of any statement is unclear.

## 2023-07-21 NOTE — Progress Notes (Signed)
 1:1 Nursing Note   Patient observed in wheelchair,alert and oriented, with 1:1 sitter - assisting pt pick out [employer]. Pt remains safe with 1:1 sitter and Q 15 min checks

## 2023-07-21 NOTE — Tx Team (Signed)
 Initial Treatment Plan 07/21/2023 12:34 AM VALTON SCHWARTZ OZH:[id number]    PATIENT STRESSORS: Substance abuse     PATIENT STRENGTHS: Ability for insight  Average or above average intelligence  Supportive family/friends    PATIENT IDENTIFIED PROBLEMS: ETOH abuse                     DISCHARGE CRITERIA:  Improved stabilization in mood, thinking, and/or behavior Need for constant or close observation no longer present  PRELIMINARY DISCHARGE PLAN: Return to previous living arrangement  PATIENT/FAMILY INVOLVEMENT: This treatment plan has been presented to and reviewed with the patient, Christian King, and/or family member.  The patient and family have been given the opportunity to ask questions and make suggestions.  Arlina Benjamin, RN 07/21/2023, 12:34 AM

## 2023-07-21 NOTE — [facility] Group Notes (Signed)
 Psychoeducational Group Note  Date:  07/21/2023 Time:  2000  Group Topic/Focus:  Wrap up group  Participation Level: Did Not Attend  Participation Quality:  Not Applicable  Affect:  Not Applicable  Cognitive:  Not Applicable  Insight:  Not Applicable  Engagement in Group: Not Applicable  Additional Comments:  Did not attend.   Catharine Clock 07/21/2023, 9:41 PM

## 2023-07-21 NOTE — Progress Notes (Signed)
 Patient sitting in wheelchair- participating in group, making collages, with 1:1 sitter beside him. Pt quickly stood up from chair, lost his balance, and proceeded to fall towards the ground with MHT assisting him to the floor. Pt reminded to ask for assistance when standing. Pt remains safe with 1:1 sitter and Q 15 min checks

## 2023-07-21 NOTE — Group Note (Signed)
 Date:  07/21/2023 Time:  9:54 AM  Group Topic/Focus:  Goals Group:   The focus of this group is to help patients establish daily goals to achieve during treatment and discuss how the patient can incorporate goal setting into their daily lives to aide in recovery. Orientation:   The focus of this group is to educate the patient on the purpose and policies of crisis stabilization and provide a format to answer questions about their admission.  The group details unit policies and expectations of patients while admitted.    Participation Level:  Active  Participation Quality:  Appropriate  Affect:  Appropriate  Cognitive:  Appropriate  Insight: Appropriate  Engagement in Group:  Developing/Improving  Modes of Intervention:  Discussion and Orientation  Additional Comments:    Christian King Christian King 07/21/2023, 9:54 AM

## 2023-07-21 NOTE — Progress Notes (Signed)
 1:1 note Patient has been up in hall way with sitter using the phone. He continues to be unsteady on his feet and impulsive to move about without assistance. Writer assisted patient to his room and set his breakfast up while report was given. Safety maintained and 1:1 in room.

## 2023-07-21 NOTE — ED Notes (Signed)
 Need discharge time needed

## 2023-07-22 ENCOUNTER — Encounter (HOSPITAL_COMMUNITY): Payer: Self-pay

## 2023-07-22 DIAGNOSIS — F1020 Alcohol dependence, uncomplicated: Secondary | ICD-10-CM | POA: Diagnosis not present

## 2023-07-22 LAB — HIV ANTIBODY (ROUTINE TESTING W REFLEX): HIV Screen 4th Generation wRfx: NONREACTIVE

## 2023-07-22 LAB — RPR: RPR Ser Ql: NONREACTIVE

## 2023-07-22 NOTE — Group Note (Signed)
 Recreation Therapy Group Note   Group Topic:Stress Management  Group Date: 07/22/2023 Start Time: 0935 End Time: 0955 Facilitators: Shan Valdes-McCall, LRT,CTRS Location: [address] Dayroom   Group Topic: Stress Management  Goal Area(s) Addresses:  Patient will identify positive stress management techniques. Patient will identify benefits of using stress management post d/c.  Behavioral Response:   Intervention: Insight Timer App  Activity: Meditation. LRT played a meditation that focused encouraged patients to envision their purpose for the day. Patients were to sit, focus and visualize how they wanted their purpose to guide their day.    Education:  Stress Management, Discharge Planning.   Education Outcome: Acknowledges Education   Affect/Mood: N/A   Participation Level: Did not attend    Clinical Observations/Individualized Feedback:     Plan: Continue to engage patient in RT group sessions 2-3x/week.   Christian King, LRT,CTRS  07/22/2023 12:38 PM

## 2023-07-22 NOTE — BH IP Treatment Plan (Signed)
 Interdisciplinary Treatment and Diagnostic Plan Update  07/22/2023 Time of Session: 10:55AM Christian King MRN: [id number]  Principal Diagnosis: Substance induced mood disorder (HCC)  Secondary Diagnoses: Principal Problem:   Substance induced mood disorder (HCC) Active Problems:   Severe alcohol use disorder (HCC)   Chronic hypokalemia   Hyperlipidemia   Current Medications:  Current Facility-Administered Medications  Medication Dose Route Frequency Provider Last Rate Last Admin   acetaminophen  (TYLENOL ) tablet 650 mg  650 mg Oral Q6H PRN Gilman Lade, NP   650 mg at 07/21/23 1255   alum & mag hydroxide-simeth (MAALOX/MYLANTA) 200-200-20 MG/5ML suspension 30 mL  30 mL Oral Q4H PRN Gilman Lade, NP       cyanocobalamin  (VITAMIN B12) tablet 1,000 mcg  1,000 mcg Oral QHS Parker, Alvin S, MD   1,000 mcg at 07/21/23 2112   folic acid  (FOLVITE ) tablet 1 mg  1 mg Oral Daily Parker, Alvin S, MD   1 mg at 07/22/23 1014   LORazepam  (ATIVAN ) tablet 1-4 mg  1-4 mg Oral Q1H PRN Parker, Alvin S, MD   2 mg at 07/22/23 0013   magnesium  hydroxide (MILK OF MAGNESIA) suspension 30 mL  30 mL Oral Daily PRN Gilman Lade, NP       magnesium  oxide (MAG-OX) tablet 400 mg  400 mg Oral QHS [name], MD       metoprolol  succinate (TOPROL -XL) 24 hr tablet 50 mg  50 mg Oral Daily Parker, Alvin S, MD   50 mg at 07/22/23 1014   multivitamin with minerals tablet 1 tablet  1 tablet Oral Daily Parker, Alvin S, MD   1 tablet at 07/22/23 1014   naltrexone (DEPADE) tablet 25 mg  25 mg Oral Daily Parker, Alvin S, MD   25 mg at 07/22/23 1014   OLANZapine (ZYPREXA) injection 10 mg  10 mg Intramuscular TID PRN Byungura, Veronique M, NP       OLANZapine (ZYPREXA) injection 5 mg  5 mg Intramuscular TID PRN Lorrene Rosser, Veronique M, NP       OLANZapine zydis (ZYPREXA) disintegrating tablet 5 mg  5 mg Oral TID PRN Gilman Lade, NP       potassium chloride  (KLOR-CON  M) CR tablet 10 mEq   10 mEq Oral Daily Parker, Alvin S, MD   10 mEq at 07/22/23 1014   simvastatin  (ZOCOR ) tablet 40 mg  40 mg Oral q1800 Parker, Alvin S, MD   40 mg at 07/21/23 1810   thiamine  (Vitamin B-1) tablet 100 mg  100 mg Oral Daily Parker, Alvin S, MD   100 mg at 07/22/23 1014   traZODone (DESYREL) tablet 50 mg  50 mg Oral QHS PRN Byungura, Veronique M, NP       PTA Medications: Medications Prior to Admission  Medication Sig Dispense Refill Last Dose/Taking   MAGnesium -Oxide 400 (240 Mg) MG tablet Take 1 tablet by mouth once daily 90 tablet 1    metoprolol  succinate (TOPROL -XL) 50 MG 24 hr tablet Take 1 tablet (50 mg total) by mouth daily with or immediately following a meal. 90 tablet 1    potassium chloride  (KLOR-CON ) 10 MEQ tablet Take 1 tablet (10 mEq total) by mouth daily. 90 tablet 2    simvastatin  (ZOCOR ) 40 MG tablet Take 1 tablet (40 mg total) by mouth daily. (Patient not taking: Reported on 07/21/2023) 90 tablet 1 Not Taking   thiamine  (VITAMIN B1) 100 MG tablet Take 1 tablet (100 mg total) by mouth daily. 90  tablet 1     Patient Stressors: Substance abuse    Patient Strengths: Ability for insight  Average or above average intelligence  Supportive family/friends   Treatment Modalities: Medication Management, Group therapy, Case management,  1 to 1 session with clinician, Psychoeducation, Recreational therapy.   Physician Treatment Plan for Primary Diagnosis: Substance induced mood disorder (HCC) Long Term Goal(s):     Short Term Goals:    Medication Management: Evaluate patient's response, side effects, and tolerance of medication regimen.  Therapeutic Interventions: 1 to 1 sessions, Unit Group sessions and Medication administration.  Evaluation of Outcomes: Not Progressing  Physician Treatment Plan for Secondary Diagnosis: Principal Problem:   Substance induced mood disorder (HCC) Active Problems:   Severe alcohol use disorder (HCC)   Chronic hypokalemia   Hyperlipidemia  Long  Term Goal(s):     Short Term Goals:       Medication Management: Evaluate patient's response, side effects, and tolerance of medication regimen.  Therapeutic Interventions: 1 to 1 sessions, Unit Group sessions and Medication administration.  Evaluation of Outcomes: Not Progressing   RN Treatment Plan for Primary Diagnosis: Substance induced mood disorder (HCC) Long Term Goal(s): Knowledge of disease and therapeutic regimen to maintain health will improve  Short Term Goals: Ability to remain free from injury will improve, Ability to verbalize frustration and anger appropriately will improve, Ability to demonstrate self-control, Ability to participate in decision making will improve, Ability to verbalize feelings will improve, Ability to disclose and discuss suicidal ideas, Ability to identify and develop effective coping behaviors will improve, and Compliance with prescribed medications will improve  Medication Management: RN will administer medications as ordered by provider, will assess and evaluate patient's response and provide education to patient for prescribed medication. RN will report any adverse and/or side effects to prescribing provider.  Therapeutic Interventions: 1 on 1 counseling sessions, Psychoeducation, Medication administration, Evaluate responses to treatment, Monitor vital signs and CBGs as ordered, Perform/monitor CIWA, COWS, AIMS and Fall Risk screenings as ordered, Perform wound care treatments as ordered.  Evaluation of Outcomes: Not Progressing   LCSW Treatment Plan for Primary Diagnosis: Substance induced mood disorder (HCC) Long Term Goal(s): Safe transition to appropriate next level of care at discharge, Engage patient in therapeutic group addressing interpersonal concerns.  Short Term Goals: Engage patient in aftercare planning with referrals and resources, Increase social support, Increase ability to appropriately verbalize feelings, Increase emotional  regulation, Facilitate acceptance of mental health diagnosis and concerns, Facilitate patient progression through stages of change regarding substance use diagnoses and concerns, Identify triggers associated with mental health/substance abuse issues, and Increase skills for wellness and recovery  Therapeutic Interventions: Assess for all discharge needs, 1 to 1 time with Social worker, Explore available resources and support systems, Assess for adequacy in community support network, Educate family and significant other(s) on suicide prevention, Complete Psychosocial Assessment, Interpersonal group therapy.  Evaluation of Outcomes: Not Progressing   Progress in Treatment: Attending groups: Yes. Participating in groups: Yes. Taking medication as prescribed: Yes. Toleration medication: Yes. Family/Significant other contact made: No, will contact:  N- Ravonda Zonia Hire (wife [phone])     Saunders Curio Sr. (father [phone]) Patient understands diagnosis: Yes. Discussing patient identified problems/goals with staff: Yes. Medical problems stabilized or resolved: Yes. Denies suicidal/homicidal ideation: Yes. Issues/concerns per patient self-inventory: No  New problem(s) identified: No  New Short Term/Long Term Goal(s): medication stabilization, elimination of SI thoughts, development of comprehensive mental wellness plan.    Patient Goals:  "I want to  get better"  Discharge Plan or Barriers: Patient recently admitted. CSW will continue to follow and assess for appropriate referrals and possible discharge planning.    Reason for Continuation of Hospitalization: Medication stabilization Other; describe Substance Use - ETOH.  Estimated Length of Stay:5-7 days  Last 3 [place] Suicide Severity Risk Score: Flowsheet Row [facility] (Current[facility]Ad[facility] (Discharged) from [facility]/21/2020 in [facility]   C-SSRS RISK CATEGORY No Risk No Risk       Last PHQ 2/9 Scores:    11/27/2022    3:30 PM 04/27/2022   12:00 PM 11/13/2021    8:54 AM  Depression screen PHQ 2/9  Decreased Interest 1 0 0  Down, Depressed, Hopeless 1 0 0  PHQ - 2 Score 2 0 0  Altered sleeping 0 0 0  Tired, decreased energy 1 0 0  Change in appetite 0 0 0  Feeling bad or failure about yourself  0 0 0  Trouble concentrating 0 0 0  Moving slowly or fidgety/restless 0 0 0  Suicidal thoughts 0 0 0  PHQ-9 Score 3 0 0  Difficult doing work/chores Not difficult at all Not difficult at all Not difficult at all    Scribe for Treatment Team: Meris Reede M Brenya Taulbee, Milinda Allen 07/22/2023 12:13 PM

## 2023-07-22 NOTE — Plan of Care (Signed)
   Problem: Education: Goal: Emotional status will improve Outcome: Progressing   Problem: Activity: Goal: Sleeping patterns will improve Outcome: Progressing

## 2023-07-22 NOTE — Progress Notes (Signed)
 [facility] MD Progress Note  07/22/2023 2:10 PM Christian King  MRN:  [id number] Subjective:   53 year old Caucasian male, married, lives with his family.  Known history of alcohol use disorder and substance-induced mood disorder.  Patient was evaluated medically following a fall.  He was intoxicated with alcohol, BAL 351 mg/dl.  His wife reports daily pattern of drinking for the past nine weeks.  While under the influence has expressed thoughts of suicide by shooting himself in the head. Routine labs significant for chronic effects of alcohol (elevated AST, macrocytic anemia and hypokalemia).  CT head does not indicate any bleeding.  I assumed care of this patient today.  Chart reviewed today.  Patient discussed at multidisciplinary team meeting.  Nursing staff reports that he has marked tremor.  He is very confused and easily agitated.  He is currently on one-to-one due to his level of acuity.  He has required as needed benzodiazepines.  At interview with patient, he is very dismissive of his alcohol intake.  He is more focused on being discharged.  States that he tripped accidentally.  Does not seem to draw any connection between his fall and level of intoxication. I engaged him towards addiction treatment.  He has no insight and no motivation to address his drinking habit.  Patient currently denies tactile/auditory/visual hallucination.  He denies any current suicidal thoughts.  Tells me that he has no recollection of making those threats of suicide while intoxicated.  States that he would never do such it into his family. Encourage to think through ways of maintaining sobriety.  Principal Problem: Severe alcohol use disorder (HCC) Diagnosis: Principal Problem:   Severe alcohol use disorder (HCC) Active Problems:   Substance induced mood disorder (HCC)   Chronic hypokalemia   Hyperlipidemia  Total Time spent with patient: 30 minutes  Past Psychiatric History:  See H&P.  Past Medical  History:  Past Medical History:  Diagnosis Date   Alcohol abuse 07/24/2010   B12 deficiency    Blood dyscrasia    hemachromatosis   BMI 25.0-25.9,adult 01/28/2015   Bronchitis    Degenerative joint disease (DJD) of hip    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   Depression    Essential hypertension 04/26/2010   Qualifier: Diagnosis of   By: Nadean August         GERD (gastroesophageal reflux disease)    Gout    Gout 04/26/2010   Qualifier: Diagnosis of   By: Nadean August         Hemochromatosis 10/03/2013   History of DVT (deep vein thrombosis) 05/15/2016   History of kidney stones    HLD (hyperlipidemia)    HTN (hypertension)    Hyperlipidemia 04/26/2010   Qualifier: Diagnosis of   By: Nadean August         Hypokalemia 03/17/2016   Hypothyroidism    Insomnia 02/05/2014   QT prolongation 03/20/2015   Severe alcohol use disorder (HCC) 07/21/2023   Ventricular tachycardia (HCC)    in setting of hypokalemia/ hypomagnesemia and ETOH   Vitamin B12 deficiency 04/26/2010   Qualifier: Diagnosis of   By: Nadean August         Wears glasses     Past Surgical History:  Procedure Laterality Date   TOTAL HIP ARTHROPLASTY Right    TOTAL HIP ARTHROPLASTY Left 03/16/2016   Procedure: TOTAL HIP ARTHROPLASTY;  Surgeon: Marlena Sima, MD;  Location: [facility] OR;  Service: Orthopedics;  Laterality: Left;   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  Social History:  Social History   Substance and Sexual Activity  Alcohol Use Yes   Alcohol/week: 12.0 standard drinks of alcohol   Types: 12 Cans of beer per week   Comment: daily     Social History   Substance and Sexual Activity  Drug Use No    Social  History   Socioeconomic History   Marital status: Married    Spouse name: Not on file   Number of children: Not on file   Years of education: Not on file   Highest education level: 12th grade  Occupational History   Not on file  Tobacco Use   Smoking status: Every Day    Current packs/day: 0.25    Average packs/day: 0.3 packs/day for 38.2 years (9.6 ttl pk-yrs)    Types: Cigarettes    Start date: 05/05/1985   Smokeless tobacco: Never  Vaping Use   Vaping status: Never Used  Substance and Sexual Activity   Alcohol use: Yes    Alcohol/week: 12.0 standard drinks of alcohol    Types: 12 Cans of beer per week    Comment: daily   Drug use: No   Sexual activity: Yes  Other Topics Concern   Not on file  Social History Narrative   Unemployed.  Previously a Automotive engineer   Social Drivers of Health   Financial Resource Strain: Low Risk  (11/27/2022)   Overall Financial Resource Strain (CARDIA)    Difficulty of Paying Living Expenses: Not very hard  Food Insecurity: No Food Insecurity (07/21/2023)   Hunger Vital Sign    Worried About Running Out of Food in the Last Year: Never true    Ran Out of Food in the Last Year: Never true  Transportation Needs: No Transportation Needs (07/21/2023)   PRAPARE - Administrator, Civil Service (Medical): No    Lack of Transportation (Non-Medical): No  Physical Activity: Insufficiently Active (11/27/2022)   Exercise Vital Sign    Days of Exercise per Week: 2 days    Minutes of Exercise per Session: 10 min  Stress: No Stress Concern Present (11/27/2022)   [employer] of Occupational Health - Occupational Stress Questionnaire    Feeling of Stress : Only a little  Social Connections: Unknown (07/21/2023)   Social Connection and Isolation Panel [NHANES]    Frequency of Communication with Friends and Family: More than three times a week    Frequency of Social Gatherings with Friends and Family: More than three times a  week    Attends Religious Services: More than 4 times per year    Active Member of [employer] or Organizations: Patient declined    Attends Banker Meetings: Patient declined    Marital Status: Married    Current Medications: Current Facility-Administered Medications  Medication Dose Route Frequency Provider Last Rate Last Admin   acetaminophen  (TYLENOL ) tablet 650 mg  650 mg Oral Q6H PRN Gilman Lade, NP   650 mg at 07/21/23 1255   alum & mag hydroxide-simeth (MAALOX/MYLANTA) 200-200-20 MG/5ML suspension 30 mL  30 mL Oral Q4H PRN Gilman Lade, NP       cyanocobalamin  (VITAMIN B12) tablet 1,000 mcg  1,000 mcg Oral QHS Parker, Alvin S, MD   1,000 mcg at 07/21/23 2112   folic acid  (FOLVITE ) tablet 1 mg  1 mg Oral Daily Parker, Alvin S, MD   1 mg at 07/22/23 1014   LORazepam  (ATIVAN ) tablet 1-4 mg  1-4 mg Oral Q1H PRN Parker, Alvin S, MD   2 mg at 07/22/23 0013   magnesium  hydroxide (MILK OF MAGNESIA) suspension 30 mL  30 mL Oral Daily PRN Gilman Lade, NP       magnesium  oxide (MAG-OX) tablet 400 mg  400 mg Oral QHS Parker, Alvin S, MD       metoprolol  succinate (TOPROL -XL) 24 hr tablet 50 mg  50 mg Oral Daily Parker, Alvin S, MD   50 mg at 07/22/23 1014   multivitamin with minerals tablet 1 tablet  1 tablet Oral Daily [name], MD   1 tablet at 07/22/23 1014   naltrexone (DEPADE) tablet 25 mg  25 mg Oral Daily Parker, Alvin S, MD   25 mg at 07/22/23 1014   OLANZapine (ZYPREXA) injection 10 mg  10 mg Intramuscular TID PRN Gilman Lade, NP       OLANZapine (ZYPREXA) injection 5 mg  5 mg Intramuscular TID PRN Lorrene Rosser, Veronique M, NP       OLANZapine zydis (ZYPREXA) disintegrating tablet 5 mg  5 mg Oral TID PRN Gilman Lade, NP       potassium chloride  (KLOR-CON  M) CR tablet 10 mEq  10 mEq Oral Daily Parker, Alvin S, MD   10 mEq at 07/22/23 1014   simvastatin  (ZOCOR ) tablet 40 mg  40 mg Oral q1800 Parker, Alvin S, MD   40 mg at 07/21/23  1810   thiamine  (Vitamin B-1) tablet 100 mg  100 mg Oral Daily Parker, Alvin S, MD   100 mg at 07/22/23 1014   traZODone (DESYREL) tablet 50 mg  50 mg Oral QHS PRN Gilman Lade, NP        Lab Results:  Results for orders placed or performed during the hospital encounter of 07/20/23 (from the past 48 hours)  Rapid urine drug screen (hospital performed)     Status: Abnormal   Collection Time: 07/21/23 11:39 AM  Result Value Ref Range   Opiates NONE DETECTED NONE DETECTED   Cocaine NONE DETECTED NONE DETECTED   Benzodiazepines POSITIVE (A) NONE DETECTED   Amphetamines NONE DETECTED NONE DETECTED   Tetrahydrocannabinol NONE DETECTED NONE DETECTED   Barbiturates NONE DETECTED NONE DETECTED    Comment: (NOTE) DRUG SCREEN FOR MEDICAL PURPOSES ONLY.  IF CONFIRMATION IS NEEDED FOR ANY PURPOSE, NOTIFY LAB WITHIN 5 DAYS.  LOWEST DETECTABLE LIMITS FOR URINE DRUG SCREEN Drug Class                     Cutoff (ng/mL) Amphetamine and metabolites    1000 Barbiturate and metabolites    200 Benzodiazepine                 200 Opiates and metabolites        300 Cocaine and metabolites        300 THC                            50 Performed at [facility], [address], [place], [place] [address]   Folate     Status: None   Collection Time: 07/21/23  6:44 PM  Result Value Ref Range   Folate 13.0 >5.9 ng/mL    Comment: Performed at [facility], [address] [place], [place] [address]  Lipid panel     Status: Abnormal   Collection Time: 07/21/23  6:44 PM  Result Value Ref Range   Cholesterol 215 (H) 0 - 200 mg/dL    Comment: REPEATED TO VERIFY   Triglycerides 87 <150 mg/dL    Comment: REPEATED TO VERIFY   HDL >135 >40 mg/dL   Total CHOL/HDL Ratio NOT CALCULATED RATIO   VLDL 17 0 - 40 mg/dL   LDL Cholesterol NOT CALCULATED 0 - 99 mg/dL    Comment: Performed at [facility], [address], [place], [place] [address]   Hemoglobin A1c     Status: None   Collection Time: 07/21/23  6:44 PM  Result Value Ref Range   Hgb A1c MFr Bld 5.3 4.8 - 5.6 %    Comment: (NOTE) Diagnosis of Diabetes The following HbA1c ranges recommended by the American Diabetes Association (ADA) may be used as an aid in the diagnosis of diabetes mellitus.  Hemoglobin             Suggested A1C NGSP%              Diagnosis  <5.7                   Non Diabetic  5.7-6.4                Pre-Diabetic  >6.4                   Diabetic  <7.0                   Glycemic control for                       adults with diabetes.     Mean Plasma Glucose 105.41 mg/dL    Comment: Performed at [facility], [address] [place], [place] [address]  RPR     Status: None   Collection Time: 07/21/23  6:44 PM  Result Value Ref Range   RPR Ser Ql NON REACTIVE NON REACTIVE    Comment: Performed at [facility], [address] [place], [place] [address]  TSH     Status: Abnormal   Collection Time: 07/21/23  6:44 PM  Result Value Ref Range   TSH 4.522 (H) 0.350 - 4.500 uIU/mL    Comment: Performed by a 3rd Generation assay with a functional sensitivity of <=0.01 uIU/mL. Performed at [facility], [address], [place], [place] [address]   Vitamin B12     Status: None   Collection Time: 07/21/23  6:44 PM  Result Value Ref Range   Vitamin B-12 394 180 - 914 pg/mL    Comment: (NOTE) This assay is not validated for testing neonatal or myeloproliferative syndrome specimens for Vitamin B12 levels. Performed at [facility], [address] [place], [place] [address]   VITAMIN D  25 Hydroxy (Vit-D Deficiency, Fractures)     Status: Abnormal   Collection Time: 07/21/23  6:44 PM  Result Value Ref Range   Vit D, 25-Hydroxy 21.85 (L) 30 - 100 ng/mL    Comment: (NOTE) Vitamin D  deficiency has been defined by the Institute of Medicine  and an Endocrine Society practice guideline as a level of  serum 25-OH  vitamin D  less than 20 ng/mL (1,2). The Endocrine Society went on to  further define vitamin D  insufficiency as a level between 21 and 29  ng/mL (2).  1. IOM (Institute of Medicine). 2010. Dietary reference intakes for  calcium  and D. [place]: [employer]. 2. Holick MF, Binkley Bracey, Bischoff-Ferrari HA, et al. Evaluation,  treatment, and prevention of vitamin D  deficiency: an Endocrine  Society clinical practice guideline, JCEM. 2011 Jul; 96(7): 1911-30.  Performed at [facility], [address], [place], [place] [address]   HIV Antibody (routine testing w rflx)     Status: None   Collection Time: 07/21/23  6:44 PM  Result Value Ref Range   HIV Screen 4th Generation wRfx Non Reactive Non Reactive    Comment: Performed at [facility], [address], [place], [place] [address]  Comprehensive metabolic panel     Status: Abnormal   Collection Time: 07/21/23  6:44 PM  Result Value Ref Range   Sodium 136 135 - 145 mmol/L   Potassium 2.9 (L) 3.5 - 5.1 mmol/L   Chloride 96 (L) 98 - 111 mmol/L   CO2 25 22 - 32 mmol/L   Glucose, Bld 116 (H) 70 - 99 mg/dL    Comment: Glucose reference range applies only to samples taken after fasting for at least 8 hours.   BUN 11 6 - 20 mg/dL   Creatinine, Ser 6.57 0.61 - 1.24 mg/dL   Calcium  10.6 (H) 8.9 - 10.3 mg/dL   Total Protein 8.7 (H) 6.5 - 8.1 g/dL   Albumin 4.6 3.5 - 5.0 g/dL   AST 32 15 - 41 U/L   ALT 28 0 - 44 U/L   Alkaline Phosphatase 76 38 - 126 U/L   Total Bilirubin 1.2 0.0 - 1.2 mg/dL   GFR, Estimated >84 >69 mL/min    Comment: (NOTE) Calculated using the CKD-EPI Creatinine Equation (2021)    Anion gap 15 5 - 15    Comment: Performed at [facility], [address] [place], [place] [address]    Blood Alcohol level:  Lab Results  Component Value Date   ETH [facility]) 07/19/2023   ETH <10 05/23/2020    Metabolic Disorder Labs: Lab Results  Component Value  Date   HGBA1C 5.3 07/21/2023   MPG 105.41 07/21/2023   No results found for: "PROLACTIN" Lab Results  Component Value Date   CHOL 215 (H) 07/21/2023   TRIG 87 07/21/2023   HDL >135 07/21/2023   CHOLHDL NOT CALCULATED 07/21/2023   VLDL 17 07/21/2023   LDLCALC NOT CALCULATED 07/21/2023   LDLCALC 65 04/27/2022    Physical Findings: AIMS:  , ,  ,  ,    CIWA:  CIWA-Ar Total: 6 COWS:     Musculoskeletal: Strength & Muscle Tone: Unable to assess at this time Gait & Station: Unable to assess at this time Patient leans: Unable to assess at this time  Psychiatric Specialty Exam:  Presentation  General Appearance and behavior,:  In bed, frail looking, stitched laceration on the right periorbital area.  Normal conjugate and movement.  Not acutely confused.  Mild tremors.  Eye Contact: Good.  Speech: Spontaneous.  Soft spoken.  Mood and Affect  Mood: Euthymic.  Affect: Restricted and appropriate.  Thought Process  Thought Processes: Normal speed of thought.  Linear and goal directed.  Descriptions of Associations:Intact  Orientation:Full (Time, Place and Person)  Thought Content: Very focused on discharge.  No current suicidal thoughts.  No homicidal thoughts.  No thoughts of violence.  No negative ruminative flooding.  No guilty ruminations.  No delusional theme.  No obsessions.  Hallucinations: No hallucination in any modality.  Sensorium  Memory: Limited since withdrawing from alcohol.  Judgment: Poor.  Insight: Poor.  Executive Functions  Concentration: Limited.  Attention Span: Fair.  Recall: Fair.  Fund of Knowledge: Good.  Language: Good   Psychomotor Activity  Normal psychomotor activity    Physical Exam: Physical Exam ROS Blood pressure (!) 133/98, pulse 77, temperature 97.7 F (36.5 C), temperature source Oral, resp. rate 20, height 5\' 6"  (1.676 m), weight 71.7 kg, SpO2 98%. Body mass index is 25.5 kg/m.   Treatment Plan  Summary: Patient has an extensive history of alcohol use disorder.  He presented intoxicated with alcohol.  He is still in withdrawals.  No complications so far.  We will continue alcohol withdrawal protocol and evaluate him further.  1.  Continue CIWA protocol 2.  Continue trazodone 50 mg at bedtime. 3.  Continue naltrexone 25 mg daily. 4.  Continue home medical medications at the same dose. 5.  We will consider scheduled Librium if daily Ativan  need is high.  Amelie Jury, MD 07/22/2023, 2:10 PM

## 2023-07-22 NOTE — BH Assessment (Signed)
 Pt scored a 16 on CIWA. Pt is 1:1, confused about day and time of day thinking it was the middle of the day. Pt states he is going home. Pt picked up his bag and was trying to walk out saying he was going to wait in the lobby for his wife. Pt assisted back into room with assist of two and oriented to where he was and that he was detoxing from alcohol. Pt took 2mg s of ativan  without problem or complaint. Pt remains sitting on the side of his bed, but no longer trying to leave. Pt encouraged to read his book or to lay down and rest. 1:1 maintained.

## 2023-07-22 NOTE — BH Assessment (Signed)
 Was called down to patients room. Pt was arguing and stated he wanted to call his wife and go home. Pt was reminded where he was and that he would be able to call his wife at 6am. Reminded patient it was 0039 and if he didn't get some rest, he would be asleep at 0600. Pt states he was not tired but agreed to sit up in bed and is reading his book. Calm at the moment.

## 2023-07-22 NOTE — Progress Notes (Signed)
 Pt confirmed he slept poorly last night "I kept talking in my sleep a lot. I have bad, weird dreams when I'm going through withdrawals; I don't hallucinate just bad dreams".Rates his anxiety 2/10, depression and anxiety both 0/10 with stressor being "Getting d/c to pay my bills". Appetite is poor "It's too much food, I can't finish that. I just ate a little". Fluids given (gatorade and water), tolerated well. Observed to be very tremulous in bilateral upper and lower extremities, unable to hold his cups during medication administration and meals. Confused, required multiple prompts to complete orientation assessment "No, today is Wednesday, it's April or so". 1:1 maintained for safety with assigned staff in attendance. Emotional support, encouragement and reassurance offered.

## 2023-07-22 NOTE — Group Note (Signed)
 Date:  07/22/2023 Time:  8:37 AM  Group Topic/Focus:  Goals Group:   The focus of this group is to help patients establish daily goals to achieve during treatment and discuss how the patient can incorporate goal setting into their daily lives to aide in recovery.    Participation Level:  Active  Participation Quality:  Appropriate  Affect:  Appropriate  Christian King 07/22/2023, 8:37 AM

## 2023-07-22 NOTE — BH Assessment (Signed)
 Pt remains resting per bed, reading book with eyes opening and closing, resp even and unlabored, skin warm and dry. 1:1 maintained

## 2023-07-22 NOTE — BH Assessment (Signed)
 Patient resting quietly with eyes closed. Resp even and unlabored, skin warm and dry, 1:1 observation maintained

## 2023-07-22 NOTE — BH Assessment (Addendum)
 Late entry:  Patient was in room with 1:1 sitter. Pt was up and around room, unsteady on feet. Pt was assisted to wheelchair to come up and get medications for the night without problems. Pt to remain 1:1

## 2023-07-22 NOTE — Progress Notes (Addendum)
 Pt fell in hall face down after using the phone due to unsteady gait (unstable, staggered, tremulous) as he was pivoting to sit back in his chair. Assisted back in chair by staff, escorted to his room. Vitals done, documented and pt assessed by assigned provider. 1:1 maintained for safety with assigned staff in attendance. Emotional support, encouragement and reassurance offered.

## 2023-07-22 NOTE — Plan of Care (Addendum)
 PRN Zyprexa 5 mg given PO at 1707 for agitation, confused, demanding d/c "I told you I have to go home now. I just bought a truck for $53,000, my insurance is $700 and all my house bills. I need to leave now, call the doctor. I can't stay here. I only drink on weekend. All he hears was me drinking and that's it. I need to leave". Verbal redirection ineffective at the time. Pt cooperative, calmer when reassessed at 1807. PRN Tylenol  given at 1716 for left leg back pain as well "I had that before coming here and my back hurts from lying in this bed". Reports relief when reassessed at 1815. 1:1 maintained for safety with assigned staff in attendance. Emotional support, encouragement and reassurance offered.  Problem: Safety: Goal: Periods of time without injury will increase Outcome: Progressing   Problem: Safety: Goal: Periods of time without injury will increase Outcome: Progressing   Problem: Activity: Goal: Sleeping patterns will improve Outcome: Not Progressing   Problem: Coping: Goal: Ability to demonstrate self-control will improve Outcome: Not Progressing

## 2023-07-22 NOTE — Progress Notes (Signed)
   07/22/23 2030  Psych Admission Type (Psych Patients Only)  Admission Status Involuntary  Psychosocial Assessment  Patient Complaints Anxiety  Eye Contact Brief  Facial Expression Anxious  Affect Appropriate to circumstance  Speech Logical/coherent  Interaction Cautious;Guarded  Motor Activity Restless  Appearance/Hygiene Unremarkable  Behavior Characteristics Cooperative;Anxious  Mood Anxious  Aggressive Behavior  Effect No apparent injury  Thought Process  Coherency Circumstantial  Content Blaming self  Delusions None reported or observed  Perception WDL  Hallucination None reported or observed  Judgment WDL  Confusion None  Danger to Self  Current suicidal ideation? Denies  Danger to Others  Danger to Others None reported or observed

## 2023-07-22 NOTE — BH Assessment (Addendum)
 Patient is awake sitting up in bed reading. Denies any complaints, cup of ice water given to patient. Resp even and unlabored. 1:1 maintained. B/P 125/90. P 95

## 2023-07-23 ENCOUNTER — Encounter (HOSPITAL_COMMUNITY): Payer: Self-pay | Admitting: Psychiatry

## 2023-07-23 DIAGNOSIS — F1020 Alcohol dependence, uncomplicated: Secondary | ICD-10-CM | POA: Diagnosis not present

## 2023-07-23 MED ORDER — LORAZEPAM 0.5 MG PO TABS
0.5000 mg | ORAL_TABLET | Freq: Four times a day (QID) | ORAL | Status: DC | PRN
Start: 2023-07-23 — End: 2023-07-26

## 2023-07-23 MED ORDER — VITAMIN B-1 100 MG PO TABS
100.0000 mg | ORAL_TABLET | Freq: Every day | ORAL | Status: DC
  Administered 2023-07-24 – 2023-07-26 (×3): 100 mg via ORAL
  Filled 2023-07-23 (×3): qty 1

## 2023-07-23 MED ORDER — ADULT MULTIVITAMIN W/MINERALS CH
1.0000 | ORAL_TABLET | Freq: Every day | ORAL | Status: DC
  Administered 2023-07-24 – 2023-07-26 (×3): 1 via ORAL
  Filled 2023-07-23 (×3): qty 1

## 2023-07-23 NOTE — Plan of Care (Signed)
  Problem: Education: Goal: Knowledge of [place] [facility] Education information/materials will improve Outcome: Adequate for Discharge Goal: Verbalization of understanding the information provided will improve Outcome: Progressing

## 2023-07-23 NOTE — Group Note (Signed)
 Date:  07/23/2023 Time:  9:08 AM  Group Topic/Focus:  Goals Group:   The focus of this group is to help patients establish daily goals to achieve during treatment and discuss how the patient can incorporate goal setting into their daily lives to aide in recovery. Orientation:   The focus of this group is to educate the patient on the purpose and policies of crisis stabilization and provide a format to answer questions about their admission.  The group details unit policies and expectations of patients while admitted.    Participation Level:  Did Not Attend

## 2023-07-23 NOTE — Progress Notes (Signed)
 Patient sitting I bed reading a book, no acute distress noted, patient denies needs at this time, patient is safe in the unit.

## 2023-07-23 NOTE — Progress Notes (Signed)
Nursing 1:1 note D:Pt observed sleeping in bed with eyes closed. RR even and unlabored. No distress noted. A: 1:1 observation continues for safety  R: pt remains safe  

## 2023-07-23 NOTE — Progress Notes (Signed)
   07/23/23 0900  Psych Admission Type (Psych Patients Only)  Admission Status Involuntary  Psychosocial Assessment  Patient Complaints Anxiety  Eye Contact Brief  Facial Expression Anxious  Affect Appropriate to circumstance  Speech Logical/coherent  Interaction Guarded  Motor Activity Restless  Appearance/Hygiene Unremarkable  Behavior Characteristics Cooperative  Mood Anxious  Thought Process  Coherency Circumstantial  Content Blaming self  Delusions None reported or observed  Perception WDL  Hallucination None reported or observed  Judgment Poor  Confusion None  Danger to Self  Current suicidal ideation? Denies  Danger to Others  Danger to Others None reported or observed

## 2023-07-23 NOTE — Progress Notes (Signed)
 Patient in his room reading a book, denies any needs, patient safe, 1:1 within patient reach.

## 2023-07-23 NOTE — Plan of Care (Signed)
   Problem: Education: Goal: Emotional status will improve Outcome: Progressing Goal: Mental status will improve Outcome: Progressing   Problem: Activity: Goal: Interest or engagement in activities will improve Outcome: Progressing Goal: Sleeping patterns will improve Outcome: Progressing   Problem: Safety: Goal: Periods of time without injury will increase Outcome: Progressing

## 2023-07-23 NOTE — Progress Notes (Signed)
Nursing 1:1 note D:Pt observed sitting in bed RR even and unlabored. No distress noted.  A: 1:1 observation continues for safety  R: pt remains safe  
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
## 2023-07-23 NOTE — [facility] Notes (Signed)

## 2023-07-23 NOTE — [facility] Suicide Risk Assessment (Signed)
 [facility] INPATIENT:  Family/Significant Other Suicide Prevention Education  Suicide Prevention Education:  Education Completed; Jourdan Durbin Sr. (father [phone]),  (name of family member/significant other) has been identified by the patient as the family member/significant other with whom the patient will be residing, and identified as the person(s) who will aid the patient in the event of a mental health crisis (suicidal ideations/suicide attempt).  With written consent from the patient, the family member/significant other has been provided the following suicide prevention education, prior to the and/or following the discharge of the patient.  Lives 1 mile from pt, reports patient has been in the house for 9 weeks straight, drinking. Reports pt has "always had a drinking problem."   Pt has firearms at home but father will take them to his house before Olen is discharged. Pt's wife will bring them to his house.   Pt's wife works 3rd shift at the hospital, pt's father will give her updates if [facility] staff is unable to get in contact with her.   The suicide prevention education provided includes the following: Suicide risk factors Suicide prevention and interventions National Suicide Hotline telephone number [facility] assessment telephone number [facility] Emergency Assistance 911 [facility] and/or Residential Mobile Crisis Unit telephone number  Request made of family/significant other to: Remove weapons (e.g., guns, rifles, knives), all items previously/currently identified as safety concern.   Remove drugs/medications (over-the-counter, prescriptions, illicit drugs), all items previously/currently identified as a safety concern.  The family member/significant other verbalizes understanding of the suicide prevention education information provided.  The family member/significant other agrees to remove the items of safety concern listed above.  Vonzell Guerin 07/23/2023,  3:51 PM

## 2023-07-23 NOTE — Progress Notes (Signed)
 PT Note  Patient Details Name: Christian King MRN: [id number] DOB: Nov 28, 1970   Cancelled Treatment:    Reason Eval/Treat Not Completed: Other (comment)  PT order received. Our OT who covers BH will see and assess pt. If any further PT needs, he will let us  know. If he can address all issues then PT will sign off.   Thank you   Alexandria Ida, PT  Acute Rehab Dept [facility]) [phone]  07/23/2023    [facility] 07/23/2023, 1:16 PM

## 2023-07-23 NOTE — Progress Notes (Signed)
 Patient in bed eating dinner, denies needs at this time, no acute distress noted, patient with sitter with in patient reach, patient safe in unit.

## 2023-07-23 NOTE — Progress Notes (Signed)
 [facility] MD Progress Note  07/23/2023 2:43 PM KLEIN WILLCOX  MRN:  [id number] Subjective:   53 year old Caucasian male, married, lives with his family.  Known history of alcohol use disorder and substance-induced mood disorder.  Patient was evaluated medically following a fall.  He was intoxicated with alcohol, BAL 351 mg/dl.  His wife reports daily pattern of drinking for the past nine weeks.  While under the influence has expressed thoughts of suicide by shooting himself in the head. Routine labs significant for chronic effects of alcohol (elevated AST, macrocytic anemia and hypokalemia).  CT head does not indicate any bleeding.  Chart reviewed today.  Patient discussed at multidisciplinary team meeting.  Nursing staff reports that patient had a fall yesterday.  This occurred while he was trying to change position.  CIWA has been within normal limits.  Lorazepam  tends to make him more sedated.  Blood pressure has been relatively stable.  No bradycardia.  No neurological deficits. Patient remains on one-to-one nursing care.  Patient was reading a novel prior to interview.  He states that he is able to focus on what he is reading.  States that his goal is to get discharged soon.  Patient acknowledged difficulties with balance.  No headaches.  No blurred vision.  No dizziness when he changes position.  Patient states that his wife has been in communication with him.  Patient remains in denial about his level of alcohol use.  No motivation to seek treatment. Encouraged.    Principal Problem: Severe alcohol use disorder (HCC) Diagnosis: Principal Problem:   Severe alcohol use disorder (HCC) Active Problems:   Substance induced mood disorder (HCC)   Chronic hypokalemia   Hyperlipidemia  Total Time spent with patient: 30 minutes  Past Psychiatric History:  See H&P.  Past Medical History:  Past Medical History:  Diagnosis Date   Alcohol abuse 07/24/2010   B12 deficiency    Blood  dyscrasia    hemachromatosis   BMI 25.0-25.9,adult 01/28/2015   Bronchitis    Degenerative joint disease (DJD) of hip    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   Depression    Essential hypertension 04/26/2010   Qualifier: Diagnosis of   By: Nadean August         GERD (gastroesophageal reflux disease)    Gout    Gout 04/26/2010   Qualifier: Diagnosis of   By: Nadean August         Hemochromatosis 10/03/2013   History of DVT (deep vein thrombosis) 05/15/2016   History of kidney stones    HLD (hyperlipidemia)    HTN (hypertension)    Hyperlipidemia 04/26/2010   Qualifier: Diagnosis of   By: Nadean August         Hypokalemia 03/17/2016   Hypothyroidism    Insomnia 02/05/2014   QT prolongation 03/20/2015   Severe alcohol use disorder (HCC) 07/21/2023   Ventricular tachycardia (HCC)    in setting of hypokalemia/ hypomagnesemia and ETOH   Vitamin B12 deficiency 04/26/2010   Qualifier: Diagnosis of   By: Nadean August         Wears glasses     Past Surgical History:  Procedure Laterality Date   TOTAL HIP ARTHROPLASTY Right    TOTAL HIP ARTHROPLASTY Left 03/16/2016   Procedure: TOTAL HIP ARTHROPLASTY;  Surgeon: Marlena Sima, MD;  Location: [facility] OR;  Service: Orthopedics;  Laterality: Left;   vascularized fibular graft     right hip for avascular necrosis   Family History:  Family  History  Problem Relation Age of Onset   Hypertension Father    Diabetes Maternal Grandmother    Hypertension Paternal Grandmother    Stroke Paternal Grandmother    Family Psychiatric  History:  See H&P.  Social History:  Social History   Substance and Sexual Activity  Alcohol Use Yes   Alcohol/week: 12.0 standard drinks of alcohol   Types: 12 Cans of beer per week   Comment: daily     Social History   Substance and Sexual Activity  Drug Use No    Social History   Socioeconomic History   Marital status: Married    Spouse name: Not on file   Number of  children: Not on file   Years of education: Not on file   Highest education level: 12th grade  Occupational History   Not on file  Tobacco Use   Smoking status: Every Day    Current packs/day: 0.25    Average packs/day: 0.3 packs/day for 38.2 years (9.6 ttl pk-yrs)    Types: Cigarettes    Start date: 05/05/1985   Smokeless tobacco: Never  Vaping Use   Vaping status: Never Used  Substance and Sexual Activity   Alcohol use: Yes    Alcohol/week: 12.0 standard drinks of alcohol    Types: 12 Cans of beer per week    Comment: daily   Drug use: No   Sexual activity: Yes  Other Topics Concern   Not on file  Social History Narrative   Unemployed.  Previously a Automotive engineer   Social Drivers of Health   Financial Resource Strain: Low Risk  (11/27/2022)   Overall Financial Resource Strain (CARDIA)    Difficulty of Paying Living Expenses: Not very hard  Food Insecurity: No Food Insecurity (07/21/2023)   Hunger Vital Sign    Worried About Running Out of Food in the Last Year: Never true    Ran Out of Food in the Last Year: Never true  Transportation Needs: No Transportation Needs (07/21/2023)   PRAPARE - Administrator, Civil Service (Medical): No    Lack of Transportation (Non-Medical): No  Physical Activity: Insufficiently Active (11/27/2022)   Exercise Vital Sign    Days of Exercise per Week: 2 days    Minutes of Exercise per Session: 10 min  Stress: No Stress Concern Present (11/27/2022)   [employer] of Occupational Health - Occupational Stress Questionnaire    Feeling of Stress : Only a little  Social Connections: Unknown (07/21/2023)   Social Connection and Isolation Panel [NHANES]    Frequency of Communication with Friends and Family: More than three times a week    Frequency of Social Gatherings with Friends and Family: More than three times a week    Attends Religious Services: More than 4 times per year    Active Member of [employer] or  Organizations: Patient declined    Attends Banker Meetings: Patient declined    Marital Status: Married    Current Medications: Current Facility-Administered Medications  Medication Dose Route Frequency Provider Last Rate Last Admin   acetaminophen  (TYLENOL ) tablet 650 mg  650 mg Oral Q6H PRN Gilman Lade, NP   650 mg at 07/22/23 1716   alum & mag hydroxide-simeth (MAALOX/MYLANTA) 200-200-20 MG/5ML suspension 30 mL  30 mL Oral Q4H PRN Gilman Lade, NP       cyanocobalamin  (VITAMIN B12) tablet 1,000 mcg  1,000 mcg Oral QHS [name], MD   1,000  mcg at 07/22/23 2158   folic acid  (FOLVITE ) tablet 1 mg  1 mg Oral Daily Parker, Alvin S, MD   1 mg at 07/23/23 1610   LORazepam  (ATIVAN ) tablet 1-4 mg  1-4 mg Oral Q1H PRN Parker, Alvin S, MD   1 mg at 07/23/23 9604   magnesium  hydroxide (MILK OF MAGNESIA) suspension 30 mL  30 mL Oral Daily PRN Gilman Lade, NP       magnesium  oxide (MAG-OX) tablet 400 mg  400 mg Oral QHS Parker, Alvin S, MD   400 mg at 07/22/23 2158   metoprolol  succinate (TOPROL -XL) 24 hr tablet 50 mg  50 mg Oral Daily Parker, Alvin S, MD   50 mg at 07/23/23 0937   multivitamin with minerals tablet 1 tablet  1 tablet Oral Daily Parker, Alvin S, MD   1 tablet at 07/23/23 0938   naltrexone (DEPADE) tablet 25 mg  25 mg Oral Daily Parker, Alvin S, MD   25 mg at 07/23/23 0937   OLANZapine (ZYPREXA) injection 10 mg  10 mg Intramuscular TID PRN Byungura, Veronique M, NP       OLANZapine (ZYPREXA) injection 5 mg  5 mg Intramuscular TID PRN Byungura, Veronique M, NP       OLANZapine zydis (ZYPREXA) disintegrating tablet 5 mg  5 mg Oral TID PRN Gilman Lade, NP   5 mg at 07/22/23 1707   potassium chloride  (KLOR-CON  M) CR tablet 10 mEq  10 mEq Oral Daily Parker, Alvin S, MD   10 mEq at 07/23/23 5409   simvastatin  (ZOCOR ) tablet 40 mg  40 mg Oral q1800 Parker, Alvin S, MD   40 mg at 07/22/23 1707   thiamine  (Vitamin B-1) tablet 100 mg  100  mg Oral Daily Parker, Alvin S, MD   100 mg at 07/23/23 8119   traZODone (DESYREL) tablet 50 mg  50 mg Oral QHS PRN Gilman Lade, NP        Lab Results:  Results for orders placed or performed during the hospital encounter of 07/20/23 (from the past 48 hours)  Folate     Status: None   Collection Time: 07/21/23  6:44 PM  Result Value Ref Range   Folate 13.0 >5.9 ng/mL    Comment: Performed at [facility], [address], [place], [place] [address]  Lipid panel     Status: Abnormal   Collection Time: 07/21/23  6:44 PM  Result Value Ref Range   Cholesterol 215 (H) 0 - 200 mg/dL    Comment: REPEATED TO VERIFY   Triglycerides 87 <150 mg/dL    Comment: REPEATED TO VERIFY   HDL >135 >40 mg/dL   Total CHOL/HDL Ratio NOT CALCULATED RATIO   VLDL 17 0 - 40 mg/dL   LDL Cholesterol NOT CALCULATED 0 - 99 mg/dL    Comment: Performed at [facility], [address] [place], [place] [address]  Hemoglobin A1c     Status: None   Collection Time: 07/21/23  6:44 PM  Result Value Ref Range   Hgb A1c MFr Bld 5.3 4.8 - 5.6 %    Comment: (NOTE) Diagnosis of Diabetes The following HbA1c ranges recommended by the American Diabetes Association (ADA) may be used as an aid in the diagnosis of diabetes mellitus.  Hemoglobin             Suggested A1C NGSP%              Diagnosis  <5.7  Non Diabetic  5.7-6.4                Pre-Diabetic  >6.4                   Diabetic  <7.0                   Glycemic control for                       adults with diabetes.     Mean Plasma Glucose 105.41 mg/dL    Comment: Performed at [facility], [address] [place], [place] [address]  RPR     Status: None   Collection Time: 07/21/23  6:44 PM  Result Value Ref Range   RPR Ser Ql NON REACTIVE NON REACTIVE    Comment: Performed at [facility], [address] [place], [place] [address]  TSH     Status: Abnormal   Collection Time:  07/21/23  6:44 PM  Result Value Ref Range   TSH 4.522 (H) 0.350 - 4.500 uIU/mL    Comment: Performed by a 3rd Generation assay with a functional sensitivity of <=0.01 uIU/mL. Performed at [facility], [address] [place], [place] [address]   Vitamin B12     Status: None   Collection Time: 07/21/23  6:44 PM  Result Value Ref Range   Vitamin B-12 394 180 - 914 pg/mL    Comment: (NOTE) This assay is not validated for testing neonatal or myeloproliferative syndrome specimens for Vitamin B12 levels. Performed at [facility], [address] [place], [place] [address]   VITAMIN D  25 Hydroxy (Vit-D Deficiency, Fractures)     Status: Abnormal   Collection Time: 07/21/23  6:44 PM  Result Value Ref Range   Vit D, 25-Hydroxy 21.85 (L) 30 - 100 ng/mL    Comment: (NOTE) Vitamin D  deficiency has been defined by the Institute of Medicine  and an Endocrine Society practice guideline as a level of serum 25-OH  vitamin D  less than 20 ng/mL (1,2). The Endocrine Society went on to  further define vitamin D  insufficiency as a level between 21 and 29  ng/mL (2).  1. IOM (Institute of Medicine). 2010. Dietary reference intakes for  calcium  and D. [place]: The [employer]. 2. Holick MF, Binkley Dousman, Bischoff-Ferrari HA, et al. Evaluation,  treatment, and prevention of vitamin D  deficiency: an Endocrine  Society clinical practice guideline, JCEM. 2011 Jul; 96(7): 1911-30.  Performed at [facility], [address], [place], [place] [address]   HIV Antibody (routine testing w rflx)     Status: None   Collection Time: 07/21/23  6:44 PM  Result Value Ref Range   HIV Screen 4th Generation wRfx Non Reactive Non Reactive    Comment: Performed at [facility], [address], [place], [place] [address]  Comprehensive metabolic panel     Status: Abnormal   Collection Time: 07/21/23  6:44 PM  Result Value Ref Range   Sodium 136 135 -  145 mmol/L   Potassium 2.9 (L) 3.5 - 5.1 mmol/L   Chloride 96 (L) 98 - 111 mmol/L   CO2 25 22 - 32 mmol/L   Glucose, Bld 116 (H) 70 - 99 mg/dL    Comment: Glucose reference range applies only to samples taken after fasting for at least 8 hours.   BUN 11 6 -  20 mg/dL   Creatinine, Ser 6.57 0.61 - 1.24 mg/dL   Calcium  10.6 (H) 8.9 - 10.3 mg/dL   Total Protein 8.7 (H) 6.5 - 8.1 g/dL   Albumin 4.6 3.5 - 5.0 g/dL   AST 32 15 - 41 U/L   ALT 28 0 - 44 U/L   Alkaline Phosphatase 76 38 - 126 U/L   Total Bilirubin 1.2 0.0 - 1.2 mg/dL   GFR, Estimated >84 >69 mL/min    Comment: (NOTE) Calculated using the CKD-EPI Creatinine Equation (2021)    Anion gap 15 5 - 15    Comment: Performed at [facility], [address] [place], [place] [address]    Blood Alcohol level:  Lab Results  Component Value Date   ETH [facility]) 07/19/2023   ETH <10 05/23/2020    Metabolic Disorder Labs: Lab Results  Component Value Date   HGBA1C 5.3 07/21/2023   MPG 105.41 07/21/2023   No results found for: "PROLACTIN" Lab Results  Component Value Date   CHOL 215 (H) 07/21/2023   TRIG 87 07/21/2023   HDL >135 07/21/2023   CHOLHDL NOT CALCULATED 07/21/2023   VLDL 17 07/21/2023   LDLCALC NOT CALCULATED 07/21/2023   LDLCALC 65 04/27/2022    Physical Findings: AIMS:  , ,  ,  ,    CIWA:  CIWA-Ar Total: 1 COWS:     Musculoskeletal: Strength & Muscle Tone: Unable to assess at this time Gait & Station: Unable to assess at this time Patient leans: Unable to assess at this time  Psychiatric Specialty Exam:  Presentation  General Appearance and behavior,:  Reading  in his bed, frail, marked intentional tremor.  Cerebellar testing indicates past-pointing.  Eye Contact: Good.  Speech: Spontaneous.  Soft spoken.  Mood and Affect  Mood: Euthymic.  Affect: Restricted and appropriate.  Thought Process  Thought Processes: Normal speed of thought.  Linear and goal  directed.  Descriptions of Associations:Intact  Orientation:Full (Time, Place and Person)  Thought Content: Remains focused on discharge.  No current suicidal thoughts.  No homicidal thoughts.  No thoughts of violence.  No negative ruminative flooding.  No guilty ruminations.  No delusional theme.  No obsessions.  Hallucinations: No hallucination in any modality.  Sensorium  Memory: Limited.  Judgment: Poor.  Insight: Poor.  Executive Functions  Concentration: Limited.  Attention Span: Fair.  Recall: Fair.  Fund of Knowledge: Good.  Language: Good   Psychomotor Activity  Normal psychomotor activity    Physical Exam: Physical Exam ROS Blood pressure 128/84, pulse 96, temperature 97.6 F (36.4 C), temperature source Oral, resp. rate 17, height 5\' 6"  (1.676 m), weight 71.7 kg, SpO2 98%. Body mass index is 25.5 kg/m.   Treatment Plan Summary: Patient is very unsteady as he is not able to appropriately judge depth when changing position.  He is exhibiting cerebellar symptoms.  We will have a detailed PT and OT evaluation.  In the meantime we will use a lower dose of lorazepam  when his car is on CIWA.  1.  CIWA protocol with 0.5 mg of lorazepam . 2.  Continue trazodone 50 mg at bedtime. 3.  Continue naltrexone 25 mg daily. 4.  Continue home medical medications at the same dose. 5.  PT and OT consult. 6.  Social worker will coordinate discharge and aftercare planning.  Amelie Jury, MD 07/23/2023, 2:43 PM

## 2023-07-23 NOTE — Progress Notes (Signed)
 Che did not attend wrap up group

## 2023-07-23 NOTE — Group Note (Signed)
 LCSW Group Therapy Note   Group Date: 07/23/2023 Start Time: 1100 End Time: 1200  Participation: did not attend  Type of Therapy:  Group Therapy   Topic: Lifestyle:  from "One Day" to "Today is Day One"  Objective:  To promote mental and physical well-being through lifestyle changes in routine, nutrition, sleep, and movement.  Goals: Increase awareness of how lifestyle habits impact mental health. Encourage one small, achievable wellness goal. Support group sharing and accountability.  Summary:  Group members explored how daily habits influence mental health and discussed the importance of starting with small, manageable changes. Participants identified personal goals and shared reflections on improving structure, sleep, diet, and physical activity.  Therapeutic Modalities: CBT - Identifying and challenging all-or-nothing thinking; promoting realistic, helpful thoughts about change. Psychoeducation - Teaching about the impact of sleep, nutrition, movement, and routine on mental health. Motivational Interviewing - Eliciting personal motivation and exploring readiness for change. Goal-Setting - Supporting SMART goals to build self-efficacy and encourage follow-through.   Aidenn Skellenger O Melea Prezioso, LCSWA 07/23/2023  12:37 PM

## 2023-07-24 DIAGNOSIS — F1020 Alcohol dependence, uncomplicated: Secondary | ICD-10-CM | POA: Diagnosis not present

## 2023-07-24 NOTE — Plan of Care (Signed)
  Problem: Education: Goal: Mental status will improve Outcome: Progressing   Problem: Activity: Goal: Interest or engagement in activities will improve Outcome: Progressing   Problem: Coping: Goal: Ability to demonstrate self-control will improve Outcome: Progressing   Problem: Health Behavior/Discharge Planning: Goal: Compliance with treatment plan for underlying cause of condition will improve Outcome: Progressing

## 2023-07-24 NOTE — Plan of Care (Signed)
   Problem: Education: Goal: Emotional status will improve Outcome: Progressing Goal: Mental status will improve Outcome: Progressing   Problem: Activity: Goal: Interest or engagement in activities will improve Outcome: Progressing   Problem: Safety: Goal: Periods of time without injury will increase Outcome: Progressing

## 2023-07-24 NOTE — Progress Notes (Signed)
 Patient observed sitting on side of bed alert/oriented. No signs of distressed noted. 1:1 continues for safety. Patient remains safe with wheelchair nearby.

## 2023-07-24 NOTE — Progress Notes (Signed)
   07/24/23 0900  Psych Admission Type (Psych Patients Only)  Admission Status Involuntary  Psychosocial Assessment  Patient Complaints Anxiety  Eye Contact Fair  Facial Expression Anxious;Animated  Affect Appropriate to circumstance  Speech Logical/coherent  Interaction Assertive  Motor Activity Restless;Tremors  Appearance/Hygiene Unremarkable  Behavior Characteristics Cooperative  Mood Anxious  Thought Process  Coherency WDL  Content WDL  Delusions None reported or observed  Perception WDL  Hallucination None reported or observed  Judgment WDL  Confusion None  Danger to Self  Current suicidal ideation? Denies  Danger to Others  Danger to Others None reported or observed

## 2023-07-24 NOTE — [facility] Group Notes (Signed)
 Adult Psychoeducational Group Note  Date:  07/24/2023 Time:  1:38 PM  Group Topic/Focus:  Goals Group:   The focus of this group is to help patients establish daily goals to achieve during treatment and discuss how the patient can incorporate goal setting into their daily lives to aide in recovery. Pt participated in group. Pt stated his goal is to improve Participation Level:  Active  Participation Quality:  Appropriate and Attentive  Affect:  Appropriate  Cognitive:  Appropriate  Insight: Good  Engagement in Group:  Engaged  Modes of Intervention:  Exploration  Additional Comments:  Pt participated in group. Pt stated is goal is to improve his walking. Pt stated achieving this will increase his changes of discharge.   Johni Narine 07/24/2023, 1:38 PM

## 2023-07-24 NOTE — Evaluation (Addendum)
 Physical Therapy Evaluation Only Patient Details Name: Christian King MRN: [id number] DOB: Aug 10, 1970 Today's Date: 07/24/2023  History of Present Illness  History of Present Illness: SHALAMAR King is a 53 y.o. who  has a past medical history of Alcohol abuse (07/24/2010), B12 deficiency, Blood dyscrasia, BMI 25.0-25.9,adult (01/28/2015), Bronchitis, Degenerative joint disease (DJD) of hip, Degenerative joint disease of left hip (03/16/2016), Depression, Essential hypertension (04/26/2010), GERD (gastroesophageal reflux disease), Gout, Gout (04/26/2010), Hemochromatosis (10/03/2013), History of DVT (deep vein thrombosis) (05/15/2016), History of kidney stones, HLD (hyperlipidemia), HTN (hypertension), Hyperlipidemia (04/26/2010), Hypokalemia (03/17/2016), Hypothyroidism, Insomnia (02/05/2014), QT prolongation (03/20/2015), Severe alcohol use disorder (HCC) (07/21/2023), Ventricular tachycardia (HCC), Vitamin B12 deficiency (04/26/2010), and Wears glasses.  He presented to [facility] for Substance induced mood disorder (HCC).  He threatened to shoot his wife, shoot EMS, and shoot himself while intoxicated.  His blood alcohol level was noted to be 351 on admission.  The patient appears to be a poor and unreliable historian.  Clinical Impression  Pt pleasant, cooperative, follows commands, engages in therapy eval. Pt ambulating ind without AD, no unsteadiness noted, gait mechanics WFL, noted to have increased R intoeing but pt reports that is baseline since having hip and ankle surgeries. Pt reports no falls except for 1 at hospital admission and 1 during session. Pt noted to have resting tremors and increased tremors with tandem stance static hold, otherwise mild or not noticeable with gait and static stance with feet in hip-width stance. Challenged pt with high level balance challenges including sidesteps, speed changes, sudden stop, direction changes, turns without drifting/veering gait pattern, no AD use  and no unsteadiness/near falls. Pt scored 22 on DGI indicating not at increased risk for falls, able to maintain NBOS without UE support for >1 minute and no unsteadiness, able to maintain SLS for 3 sec on each LE. Pt reports performing heel slides throughout the night and R adductor discomfort has resolved. Pt reports feeling like his mobility is at baseline and ready to d/c home with spouse and family support. No f/u PT recommended at d/c.      If plan is discharge home, recommend the following:     Can travel by private vehicle        Equipment Recommendations None recommended by PT  Recommendations for Other Services       Functional Status Assessment Patient has not had a recent decline in their functional status     Precautions / Restrictions Precautions Precautions: Fall Restrictions Weight Bearing Restrictions Per Provider Order: No      Mobility  Bed Mobility                    Transfers Overall transfer level: Independent Equipment used: None               General transfer comment: ind with transfers from w/c and chair, no assist, no cues, no unsteadiness or near falls    Ambulation/Gait Ambulation/Gait assistance: Independent Gait Distance (Feet):  (>500) Assistive device: None Gait Pattern/deviations: WFL(Within Functional Limits) Gait velocity: WFL     General Gait Details: Pt with symmetrical step length, good bil foot clearance, good heel-toe pattern, increased RLE intoeing as pt reports from past hip and ankle surgeries. Challenged pt with speed changes, head turns, sudden stop, 180 degree turns without LOB or drifting/veering noted  Stairs            Wheelchair Mobility     Tilt Bed    Modified Rankin (Stroke  Patients Only)       Balance                             High level balance activites: Side stepping, Direction changes, Turns, Sudden stops, Head turns High Level Balance Comments: Pt able to perform  sidestepping, direction changes, turns, sudden stops, head turns without drifting, without veering, without device, without assistance or cues from therapist. Standardized Balance Assessment Standardized Balance Assessment : Dynamic Gait Index   Dynamic Gait Index Level Surface: Normal Change in Gait Speed: Normal Gait with Horizontal Head Turns: Normal Gait with Vertical Head Turns: Normal Gait and Pivot Turn: Normal Step Over Obstacle: Mild Impairment Step Around Obstacles: Normal Steps: Mild Impairment (no steps available, able to maintain SLS for 3 sec bil without UE support, >30 sec with single UE support; anticipate 2 due to handrail use) Total Score: 22       Pertinent Vitals/Pain Pain Assessment Pain Assessment: No/denies pain    Home Living Family/patient expects to be discharged to:: Private residence Living Arrangements: Spouse/significant other Available Help at Discharge: Family Type of Home: House Home Access: Stairs to enter   [employer] of Steps: 5 bil handrail front porch, 14 bil handrail at back deck Alternate Level Stairs-Number of Steps: flight Home Layout: Multi-level;Bed/bath upstairs;Able to live on main level with bedroom/bathroom Home Equipment: Rolling Walker (2 wheels);Cane - single point      Prior Function Prior Level of Function : Independent/Modified Independent;Working/employed;Driving             Mobility Comments: pt reports ind with home and community ambulation without AD, 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; pt reports no falls other than the one present on admission and 1 during admission ADLs Comments: pt reports ind     Extremity/Trunk Assessment   Upper Extremity Assessment Upper Extremity Assessment: Defer to OT evaluation    Lower Extremity Assessment Lower Extremity Assessment: Overall WFL for tasks assessed;RLE deficits/detail;LLE deficits/detail RLE Deficits / Details:  AROM WFL throughout, knee extension 4+/5, hip adduction 4+/5, knee flexion 4+/5, hip abduction 5/5, hip flexion 5/5, ankle 5/5 RLE Sensation:  (numbness/tingling) RLE Coordination: WNL LLE Deficits / Details: AROM WFL throughout, knee extension 5/5, hip adduction 5/5, knee flexion 5/5, hip abduction 5/5, hip flexion 5/5, ankle 5/5 LLE Sensation:  (numbness/tingling) LLE Coordination: WNL    Cervical / Trunk Assessment Cervical / Trunk Assessment: Normal  Communication   Communication Communication: No apparent difficulties    Cognition Arousal: Alert Behavior During Therapy: WFL for tasks assessed/performed   PT - Cognitive impairments: No apparent impairments                       PT - Cognition Comments: pt pleasant, appropriate, follows commands, conversational Following commands: Intact       Cueing       General Comments      Exercises     Assessment/Plan    PT Assessment Patient does not need any further PT services  PT Problem List         PT Treatment Interventions      PT Goals (Current goals can be found in the Care Plan section)  Acute Rehab PT Goals Patient Stated Goal: return home PT Goal Formulation: All assessment and education complete, DC therapy    Frequency       Co-evaluation  AM-PAC PT "6 Clicks" Mobility  Outcome Measure Help needed turning from your back to your side while in a flat bed without using bedrails?: None Help needed moving from lying on your back to sitting on the side of a flat bed without using bedrails?: None Help needed moving to and from a bed to a chair (including a wheelchair)?: None Help needed standing up from a chair using your arms (e.g., wheelchair or bedside chair)?: None Help needed to walk in hospital room?: None Help needed climbing 3-5 steps with a railing? : None 6 Click Score: 24    End of Session Equipment Utilized During Treatment: Gait belt Activity Tolerance: Patient  tolerated treatment well Patient left: Other (comment) (activity room watching movie with sitter) Nurse Communication: Mobility status PT Visit Diagnosis: History of falling (Z91.81)    Time: 1610-9604 PT Time Calculation (min) (ACUTE ONLY): 26 min   Charges:   PT Evaluation $PT Eval Low Complexity: 1 Low PT Treatments $Gait Training: 8-22 mins PT General Charges $$ ACUTE PT VISIT: 1 Visit         Tori Verneal Wiers PT, DPT 07/24/23, 2:02 PM

## 2023-07-24 NOTE — Progress Notes (Signed)
 [facility] MD Progress Note  07/24/2023 11:41 AM Christian King  MRN:  [id number] Subjective:   53 year old Caucasian male, married, lives with his family.  Known history of alcohol use disorder and substance-induced mood disorder.  Patient was evaluated medically following a fall.  He was intoxicated with alcohol, BAL 351 mg/dl.  His wife reports daily pattern of drinking for the past nine weeks.  While under the influence has expressed thoughts of suicide by shooting himself in the head. Routine labs significant for chronic effects of alcohol (elevated AST, macrocytic anemia and hypokalemia).  CT head does not indicate any bleeding.  Chart reviewed today.  Patient discussed at multidisciplinary team meeting.  Nursing staff reports that patient slept well last night.  He is steady on his feet.  No falls lately.  No observed response to internal stimuli.  He is interacting with the milieu.  OT input appreciated.  We are awaiting PT input.  Seen today.  Patient tells me that he feels better.  He feels clear minded.  States that his wife is visiting him later today.  He is eager to get through this process so that he can go back home.  No motivation to get into rehab as he continues to minimize his alcohol use.  No insight into the role of alcohol in his physical health issues.  He denies any cravings at this time.  No evidence of depression.  No overwhelming anxiety.  No evidence of mania.  No evidence of psychosis.  No rageful thoughts towards self or towards anybody else.   Principal Problem: Severe alcohol use disorder (HCC) Diagnosis: Principal Problem:   Severe alcohol use disorder (HCC) Active Problems:   Substance induced mood disorder (HCC)   Chronic hypokalemia   Hyperlipidemia  Total Time spent with patient: 30 minutes  Past Psychiatric History:  See H&P.  Past Medical History:  Past Medical History:  Diagnosis Date   Alcohol abuse 07/24/2010   B12 deficiency    Blood  dyscrasia    hemachromatosis   BMI 25.0-25.9,adult 01/28/2015   Bronchitis    Degenerative joint disease (DJD) of hip    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   Depression    Essential hypertension 04/26/2010   Qualifier: Diagnosis of   By: Nadean August         GERD (gastroesophageal reflux disease)    Gout    Gout 04/26/2010   Qualifier: Diagnosis of   By: Nadean August         Hemochromatosis 10/03/2013   History of DVT (deep vein thrombosis) 05/15/2016   History of kidney stones    HLD (hyperlipidemia)    HTN (hypertension)    Hyperlipidemia 04/26/2010   Qualifier: Diagnosis of   By: Nadean August         Hypokalemia 03/17/2016   Hypothyroidism    Insomnia 02/05/2014   QT prolongation 03/20/2015   Severe alcohol use disorder (HCC) 07/21/2023   Ventricular tachycardia (HCC)    in setting of hypokalemia/ hypomagnesemia and ETOH   Vitamin B12 deficiency 04/26/2010   Qualifier: Diagnosis of   By: Nadean August         Wears glasses     Past Surgical History:  Procedure Laterality Date   TOTAL HIP ARTHROPLASTY Right    TOTAL HIP ARTHROPLASTY Left 03/16/2016   Procedure: TOTAL HIP ARTHROPLASTY;  Surgeon: Marlena Sima, MD;  Location: [facility] OR;  Service: Orthopedics;  Laterality: Left;   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  Social History:  Social History   Substance and Sexual Activity  Alcohol Use Yes   Alcohol/week: 12.0 standard drinks of alcohol   Types: 12 Cans of beer per week   Comment: daily     Social History   Substance and Sexual Activity  Drug Use No    Social History   Socioeconomic History   Marital status: Married    Spouse name: Not on file   Number of  children: Not on file   Years of education: Not on file   Highest education level: 12th grade  Occupational History   Not on file  Tobacco Use   Smoking status: Every Day    Current packs/day: 0.25    Average packs/day: 0.3 packs/day for 38.2 years (9.6 ttl pk-yrs)    Types: Cigarettes    Start date: 05/05/1985   Smokeless tobacco: Never  Vaping Use   Vaping status: Never Used  Substance and Sexual Activity   Alcohol use: Yes    Alcohol/week: 12.0 standard drinks of alcohol    Types: 12 Cans of beer per week    Comment: daily   Drug use: No   Sexual activity: Yes  Other Topics Concern   Not on file  Social History Narrative   Unemployed.  Previously a Automotive engineer   Social Drivers of Health   Financial Resource Strain: Low Risk  (11/27/2022)   Overall Financial Resource Strain (CARDIA)    Difficulty of Paying Living Expenses: Not very hard  Food Insecurity: No Food Insecurity (07/21/2023)   Hunger Vital Sign    Worried About Running Out of Food in the Last Year: Never true    Ran Out of Food in the Last Year: Never true  Transportation Needs: No Transportation Needs (07/21/2023)   PRAPARE - Administrator, Civil Service (Medical): No    Lack of Transportation (Non-Medical): No  Physical Activity: Insufficiently Active (11/27/2022)   Exercise Vital Sign    Days of Exercise per Week: 2 days    Minutes of Exercise per Session: 10 min  Stress: No Stress Concern Present (11/27/2022)   [employer] of Occupational Health - Occupational Stress Questionnaire    Feeling of Stress : Only a little  Social Connections: Unknown (07/21/2023)   Social Connection and Isolation Panel [NHANES]    Frequency of Communication with Friends and Family: More than three times a week    Frequency of Social Gatherings with Friends and Family: More than three times a week    Attends Religious Services: More than 4 times per year    Active Member of [employer] or  Organizations: Patient declined    Attends Banker Meetings: Patient declined    Marital Status: Married    Current Medications: Current Facility-Administered Medications  Medication Dose Route Frequency Provider Last Rate Last Admin   acetaminophen  (TYLENOL ) tablet 650 mg  650 mg Oral Q6H PRN Gilman Lade, NP   650 mg at 07/22/23 1716   alum & mag hydroxide-simeth (MAALOX/MYLANTA) 200-200-20 MG/5ML suspension 30 mL  30 mL Oral Q4H PRN Gilman Lade, NP       cyanocobalamin  (VITAMIN B12) tablet 1,000 mcg  1,000 mcg Oral QHS Parker, Alvin S, MD   1,000 mcg at 07/23/23 2050   folic acid  (FOLVITE ) tablet 1 mg  1 mg Oral Daily Parker, Alvin S, MD   1 mg at 07/24/23 0827   LORazepam  (ATIVAN ) tablet 0.5 mg  0.5 mg Oral Q6H PRN Arcangel Minion A, MD       magnesium  hydroxide (MILK OF MAGNESIA) suspension 30 mL  30 mL Oral Daily PRN Lorrene Rosser, Veronique M, NP       magnesium  oxide (MAG-OX) tablet 400 mg  400 mg Oral QHS Parker, Alvin S, MD   400 mg at 07/23/23 2049   metoprolol  succinate (TOPROL -XL) 24 hr tablet 50 mg  50 mg Oral Daily Parker, Alvin S, MD   50 mg at 07/24/23 2956   multivitamin with minerals tablet 1 tablet  1 tablet Oral Daily Francella Barnett A, MD   1 tablet at 07/24/23 0827   naltrexone (DEPADE) tablet 25 mg  25 mg Oral Daily Parker, Alvin S, MD   25 mg at 07/24/23 0828   OLANZapine (ZYPREXA) injection 10 mg  10 mg Intramuscular TID PRN Byungura, Veronique M, NP       OLANZapine (ZYPREXA) injection 5 mg  5 mg Intramuscular TID PRN Byungura, Veronique M, NP       OLANZapine zydis (ZYPREXA) disintegrating tablet 5 mg  5 mg Oral TID PRN Gilman Lade, NP   5 mg at 07/22/23 1707   potassium chloride  (KLOR-CON  M) CR tablet 10 mEq  10 mEq Oral Daily Parker, Alvin S, MD   10 mEq at 07/24/23 0827   simvastatin  (ZOCOR ) tablet 40 mg  40 mg Oral q1800 Parker, Alvin S, MD   40 mg at 07/23/23 1736   thiamine  (Vitamin B-1) tablet 100 mg  100 mg Oral Daily  Shonta Bourque, Iline Mallory, MD   100 mg at 07/24/23 0828   traZODone (DESYREL) tablet 50 mg  50 mg Oral QHS PRN Byungura, Veronique M, NP   50 mg at 07/23/23 2049    Lab Results:  No results found for this or any previous visit (from the past 48 hours).   Blood Alcohol level:  Lab Results  Component Value Date   ETH 351 [facility]) 07/19/2023   ETH <10 05/23/2020    Metabolic Disorder Labs: Lab Results  Component Value Date   HGBA1C 5.3 07/21/2023   MPG 105.41 07/21/2023   No results found for: "PROLACTIN" Lab Results  Component Value Date   CHOL 215 (H) 07/21/2023   TRIG 87 07/21/2023   HDL >135 07/21/2023   CHOLHDL NOT CALCULATED 07/21/2023   VLDL 17 07/21/2023   LDLCALC NOT CALCULATED 07/21/2023   LDLCALC 65 04/27/2022    Physical Findings: AIMS:  , ,  ,  ,    CIWA:  CIWA-Ar Total: 2 COWS:     Musculoskeletal: Strength & Muscle Tone: Unable to assess at this time Gait & Station: Unable to assess at this time Patient leans: Unable to assess at this time  Psychiatric Specialty Exam:  Presentation  General Appearance and behavior,:  Out in the day room watching TV.  Able to walk without any supports.  Good rapport.  Eye Contact: Good.  Speech: Spontaneous.  Normal rate, tone and volume  Mood and Affect  Mood: Euthymic.  Affect: Restricted and appropriate.  Thought Process  Thought Processes: Normal speed of thought.  Linear and goal directed.  Descriptions of Associations:Intact  Orientation:Full (Time, Place and Person)  Thought Content: Focused  on discharge soon.  Future-oriented.  No current suicidal thoughts.  No homicidal thoughts.  No thoughts of violence.  No negative ruminative flooding.  No guilty ruminations.  No delusional theme.  No obsessions.  Hallucinations: No hallucination in any modality.  Sensorium  Memory: Fair.  Judgment: Fair.  Insight: Poor.  Executive Functions  Concentration: Good.  Attention Span: Good.     Recall: Fair.  Fund of Knowledge: Good.  Language: Good   Psychomotor Activity  Normal psychomotor activity    Physical Exam: Physical Exam ROS Blood pressure 117/77, pulse 80, temperature 97.6 F (36.4 C), temperature source Oral, resp. rate 17, height 5\' 6"  (1.676 m), weight 71.7 kg, SpO2 96%. Body mass index is 25.5 kg/m.   Treatment Plan Summary: Patient seems to have completely come off psychoactive substances.  He is fully alert and able to engage cognitively.  No days of depression.  No evidence of mania.  No evidence of psychosis.  He remains in denial about his addiction.  We are awaiting PT recommendation.  We will keep his medicine the same and get feedback from his wife tomorrow.   1.  CIWA protocol with 0.5 mg of lorazepam . 2.  Continue trazodone 50 mg at bedtime. 3.  Continue naltrexone 25 mg daily. 4.  Continue home medical medications at the same dose. 5.  PT and OT consult. 6.  Social worker will coordinate discharge and aftercare planning.  Amelie Jury, MD 07/24/2023, 11:41 AM

## 2023-07-24 NOTE — Group Note (Signed)
 Recreation Therapy Group Note   Group Topic:Health and Wellness  Group Date: 07/24/2023 Start Time: 0940 End Time: 1000 Facilitators: Nakima Fluegge-McCall, LRT,CTRS Location: [address] Dayroom   Group Topic: Wellness  Goal Area(s) Addresses:  Patient will define components of whole wellness. Patient will verbalize benefit of whole wellness.  Behavioral Response:   Intervention: Music  Activity: Exercise. LRT and patients completed a series of stretches and exercises. Patients took turns leading the group in the exercises and stretches of their choosing. Patients were encouraged to get water and take breaks as needed.   Education: Wellness, Building control surveyor.   Education Outcome: Acknowledges education/In group clarification offered/Needs additional education.    Affect/Mood: N/A   Participation Level: Did not attend    Clinical Observations/Individualized Feedback:     Plan: Continue to engage patient in RT group sessions 2-3x/week.   Delbert Darley-McCall, LRT,CTRS 07/24/2023 1:46 PM

## 2023-07-24 NOTE — Progress Notes (Signed)
   07/23/23 2030  Psych Admission Type (Psych Patients Only)  Admission Status Involuntary  Psychosocial Assessment  Patient Complaints Anxiety  Eye Contact Brief  Facial Expression Anxious  Affect Appropriate to circumstance  Speech Logical/coherent  Interaction Cautious;Guarded  Motor Activity Restless  Appearance/Hygiene Unremarkable  Behavior Characteristics Cooperative  Mood Anxious  Aggressive Behavior  Effect No apparent injury  Thought Process  Coherency Circumstantial  Content Blaming self  Delusions None reported or observed  Perception WDL  Hallucination None reported or observed  Judgment WDL  Confusion None  Danger to Self  Current suicidal ideation? Denies  Danger to Others  Danger to Others None reported or observed

## 2023-07-24 NOTE — [facility] Suicide Risk Assessment (Signed)
 [facility] INPATIENT:  Family/Significant Other Suicide Prevention Education  Suicide Prevention Education:  Education Completed; Christian King, Christian King (Spouse) [phone] ,  (name of family member/significant other) has been identified by the patient as the family member/significant other with whom the patient will be residing, and identified as the person(s) who will aid the patient in the event of a mental health crisis (suicidal ideations/suicide attempt).  With written consent from the patient, the family member/significant other has been provided the following suicide prevention education, prior to the and/or following the discharge of the patient.  Wife will plan to pick pt up on Friday at 12 for discharge. He is able to return home. Guns have been removed from the home.   The suicide prevention education provided includes the following: Suicide risk factors Suicide prevention and interventions National Suicide Hotline telephone number [facility] assessment telephone number [facility] Emergency Assistance 911 [facility] and/or Residential Mobile Crisis Unit telephone number  Request made of family/significant other to: Remove weapons (e.g., guns, rifles, knives), all items previously/currently identified as safety concern.   Remove drugs/medications (over-the-counter, prescriptions, illicit drugs), all items previously/currently identified as a safety concern.  The family member/significant other verbalizes understanding of the suicide prevention education information provided.  The family member/significant other agrees to remove the items of safety concern listed above.  Christian King 07/24/2023, 3:39 PM

## 2023-07-24 NOTE — Progress Notes (Signed)
 1:1 Note: Patient observed sitting in day room talking with peers. No acute distress noted. Patient is safe. 1:1 monitoring continued.

## 2023-07-24 NOTE — Progress Notes (Signed)
 1:1 Note. Patient observed seated in dayroom participating in group. No distress noted. Patient remains safe.

## 2023-07-24 NOTE — Progress Notes (Signed)
Nursing 1:1 note D:Pt observed sleeping in bed with eyes closed. RR even and unlabored. No distress noted. A: 1:1 observation continues for safety  R: pt remains safe  

## 2023-07-24 NOTE — Progress Notes (Signed)
 1630 patient received P.O. olanzapine for mild irritability and confusion. Patient attempted to open unit doors "to go see how much a pack of gum costs." Patient also requested ativan  for his nerves. Patient observed to be hyperverbal and tremulous. Tremor observed earlier at baseline as well.

## 2023-07-24 NOTE — Progress Notes (Signed)
   07/24/23 2045  Psych Admission Type (Psych Patients Only)  Admission Status Involuntary  Psychosocial Assessment  Patient Complaints Anxiety  Eye Contact Brief  Facial Expression Anxious  Affect Appropriate to circumstance  Speech Logical/coherent  Interaction Cautious;Guarded  Motor Activity Restless  Appearance/Hygiene Unremarkable  Behavior Characteristics Cooperative  Mood Anxious  Aggressive Behavior  Effect No apparent injury  Thought Process  Coherency Circumstantial  Content Blaming self  Delusions None reported or observed  Perception WDL  Hallucination None reported or observed  Judgment WDL  Confusion None  Danger to Self  Current suicidal ideation? Denies  Danger to Others  Danger to Others None reported or observed

## 2023-07-25 DIAGNOSIS — F1020 Alcohol dependence, uncomplicated: Secondary | ICD-10-CM | POA: Diagnosis not present

## 2023-07-25 NOTE — Plan of Care (Signed)
  Problem: Education: Goal: Emotional status will improve Outcome: Progressing Goal: Verbalization of understanding the information provided will improve Outcome: Progressing   Problem: Activity: Goal: Interest or engagement in activities will improve Outcome: Progressing Goal: Sleeping patterns will improve Outcome: Progressing   Problem: Coping: Goal: Ability to verbalize frustrations and anger appropriately will improve Outcome: Progressing

## 2023-07-25 NOTE — Progress Notes (Signed)
   07/25/23 1200  Psych Admission Type (Psych Patients Only)  Admission Status Involuntary  Psychosocial Assessment  Patient Complaints Anxiety  Eye Contact Brief  Facial Expression Anxious  Affect Appropriate to circumstance  Speech Logical/coherent  Interaction Assertive  Motor Activity Tremors;Restless  Appearance/Hygiene Unremarkable  Behavior Characteristics Cooperative  Mood Anxious  Thought Process  Coherency WDL  Content WDL  Delusions None reported or observed  Perception WDL  Hallucination None reported or observed  Judgment WDL  Confusion None  Danger to Self  Current suicidal ideation? Denies  Danger to Others  Danger to Others None reported or observed     07/25/23 1200  Psych Admission Type (Psych Patients Only)  Admission Status Involuntary  Psychosocial Assessment  Patient Complaints Anxiety  Eye Contact Brief  Facial Expression Anxious  Affect Appropriate to circumstance  Speech Logical/coherent  Interaction Assertive  Motor Activity Tremors;Restless  Appearance/Hygiene Unremarkable  Behavior Characteristics Cooperative  Mood Anxious  Thought Process  Coherency WDL  Content WDL  Delusions None reported or observed  Perception WDL  Hallucination None reported or observed  Judgment WDL  Confusion None  Danger to Self  Current suicidal ideation? Denies  Danger to Others  Danger to Others None reported or observed

## 2023-07-25 NOTE — Progress Notes (Signed)
   07/25/23 2215  Psych Admission Type (Psych Patients Only)  Admission Status Involuntary  Psychosocial Assessment  Patient Complaints Anxiety  Eye Contact Brief  Facial Expression Anxious  Affect Appropriate to circumstance  Speech Logical/coherent  Interaction Cautious;Guarded  Motor Activity Restless  Appearance/Hygiene Unremarkable  Behavior Characteristics Cooperative  Mood Anxious  Aggressive Behavior  Effect No apparent injury  Thought Process  Coherency Circumstantial  Content Blaming self  Delusions None reported or observed  Perception WDL  Hallucination None reported or observed  Judgment WDL  Confusion None  Danger to Self  Current suicidal ideation? Denies  Danger to Others  Danger to Others None reported or observed

## 2023-07-25 NOTE — Plan of Care (Signed)
   Problem: Education: Goal: Emotional status will improve Outcome: Progressing Goal: Mental status will improve Outcome: Progressing   Problem: Activity: Goal: Interest or engagement in activities will improve Outcome: Progressing Goal: Sleeping patterns will improve Outcome: Progressing

## 2023-07-25 NOTE — Progress Notes (Signed)
 [facility] MD Progress Note  07/25/2023 11:30 AM Christian King  MRN:  [id number] Subjective:   53 year old Caucasian male, married, lives with his family.  Known history of alcohol use disorder and substance-induced mood disorder.  Patient was evaluated medically following a fall.  He was intoxicated with alcohol, BAL 351 mg/dl.  His wife reports daily pattern of drinking for the past nine weeks.  While under the influence has expressed thoughts of suicide by shooting himself in the head. Routine labs significant for chronic effects of alcohol (elevated AST, macrocytic anemia and hypokalemia).  CT head does not indicate any bleeding.  Chart reviewed today.  Patient discussed at multidisciplinary team meeting.  Nursing staff reports that patient has been appropriate on the unit.  He has not required as needed benzodiazepines.  He is steady on his feet.  No falls.  He slept well last night.  OT have assessed patient.  We appreciate their input.  Social worker got feedback from patient's wife.  She feels like patient is back to her baseline.  Patient's wife looks forward to patient being back home tomorrow.  No current concerns about dangerousness.  Seen today.  Patient tells me that he is feeling good.  He feels steady on his feet.  No dizziness upon changing his posture.  No falls lately.  Patient states that he looks forward to getting back into his routines.  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  Patient states that he is scheduled to have a drug test once he is back home.  Tells me that he does not use drugs.  I probed him on how he wants to deal with his alcohol use.  Patient states that he does not really plan to drink.  States that once he is busy on the road he does not drink.  Patient has no interest in AA or NA.  He has no interest in residential rehab.  No evidence of depression.  No evidence of mania.  No evidence of psychosis.  No violent thoughts  towards self or towards others.  Principal Problem: Severe alcohol use disorder (HCC) Diagnosis: Principal Problem:   Severe alcohol use disorder (HCC) Active Problems:   Substance induced mood disorder (HCC)   Chronic hypokalemia   Hyperlipidemia  Total Time spent with patient: 30 minutes  Past Psychiatric History:  See H&P.  Past Medical History:  Past Medical History:  Diagnosis Date   Alcohol abuse 07/24/2010   B12 deficiency    Blood dyscrasia    hemachromatosis   BMI 25.0-25.9,adult 01/28/2015   Bronchitis    Degenerative joint disease (DJD) of hip    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   Depression    Essential hypertension 04/26/2010   Qualifier: Diagnosis of   By: Nadean August         GERD (gastroesophageal reflux disease)    Gout    Gout 04/26/2010   Qualifier: Diagnosis of   By: Nadean August         Hemochromatosis 10/03/2013   History of DVT (deep vein thrombosis) 05/15/2016   History of kidney stones    HLD (hyperlipidemia)    HTN (hypertension)    Hyperlipidemia 04/26/2010   Qualifier: Diagnosis of   By: Nadean August         Hypokalemia 03/17/2016   Hypothyroidism    Insomnia 02/05/2014   QT prolongation 03/20/2015   Severe alcohol use disorder (HCC) 07/21/2023   Ventricular  tachycardia (HCC)    in setting of hypokalemia/ hypomagnesemia and ETOH   Vitamin B12 deficiency 04/26/2010   Qualifier: Diagnosis of   By: Nadean August         Wears glasses     Past Surgical History:  Procedure Laterality Date   TOTAL HIP ARTHROPLASTY Right    TOTAL HIP ARTHROPLASTY Left 03/16/2016   Procedure: TOTAL HIP ARTHROPLASTY;  Surgeon: Marlena Sima, MD;  Location: [facility] OR;  Service: Orthopedics;  Laterality: Left;   vascularized fibular graft     right hip for avascular necrosis   Family History:  Family History  Problem Relation Age of Onset   Hypertension Father    Diabetes Maternal Grandmother    Hypertension Paternal  Grandmother    Stroke Paternal Grandmother    Family Psychiatric  History:  See H&P.  Social History:  Social History   Substance and Sexual Activity  Alcohol Use Yes   Alcohol/week: 12.0 standard drinks of alcohol   Types: 12 Cans of beer per week   Comment: daily     Social History   Substance and Sexual Activity  Drug Use No    Social History   Socioeconomic History   Marital status: Married    Spouse name: Not on file   Number of children: Not on file   Years of education: Not on file   Highest education level: 12th grade  Occupational History   Not on file  Tobacco Use   Smoking status: Every Day    Current packs/day: 0.25    Average packs/day: 0.3 packs/day for 38.2 years (9.6 ttl pk-yrs)    Types: Cigarettes    Start date: 05/05/1985   Smokeless tobacco: Never  Vaping Use   Vaping status: Never Used  Substance and Sexual Activity   Alcohol use: Yes    Alcohol/week: 12.0 standard drinks of alcohol    Types: 12 Cans of beer per week    Comment: daily   Drug use: No   Sexual activity: Yes  Other Topics Concern   Not on file  Social History Narrative   Unemployed.  Previously a Automotive engineer   Social Drivers of Health   Financial Resource Strain: Low Risk  (11/27/2022)   Overall Financial Resource Strain (CARDIA)    Difficulty of Paying Living Expenses: Not very hard  Food Insecurity: No Food Insecurity (07/21/2023)   Hunger Vital Sign    Worried About Running Out of Food in the Last Year: Never true    Ran Out of Food in the Last Year: Never true  Transportation Needs: No Transportation Needs (07/21/2023)   PRAPARE - Administrator, Civil Service (Medical): No    Lack of Transportation (Non-Medical): No  Physical Activity: Insufficiently Active (11/27/2022)   Exercise Vital Sign    Days of Exercise per Week: 2 days    Minutes of Exercise per Session: 10 min  Stress: No Stress Concern Present (11/27/2022)   [employer]  of Occupational Health - Occupational Stress Questionnaire    Feeling of Stress : Only a little  Social Connections: Unknown (07/21/2023)   Social Connection and Isolation Panel [NHANES]    Frequency of Communication with Friends and Family: More than three times a week    Frequency of Social Gatherings with Friends and Family: More than three times a week    Attends Religious Services: More than 4 times per year    Active Member of [employer] or Organizations: Patient declined  Attends Banker Meetings: Patient declined    Marital Status: Married    Current Medications: Current Facility-Administered Medications  Medication Dose Route Frequency Provider Last Rate Last Admin   acetaminophen  (TYLENOL ) tablet 650 mg  650 mg Oral Q6H PRN Gilman Lade, NP   650 mg at 07/22/23 1716   alum & mag hydroxide-simeth (MAALOX/MYLANTA) 200-200-20 MG/5ML suspension 30 mL  30 mL Oral Q4H PRN Gilman Lade, NP       cyanocobalamin  (VITAMIN B12) tablet 1,000 mcg  1,000 mcg Oral QHS Parker, Alvin S, MD   1,000 mcg at 07/24/23 2122   folic acid  (FOLVITE ) tablet 1 mg  1 mg Oral Daily Parker, Alvin S, MD   1 mg at 07/25/23 0740   LORazepam  (ATIVAN ) tablet 0.5 mg  0.5 mg Oral Q6H PRN Cailan General A, MD       magnesium  hydroxide (MILK OF MAGNESIA) suspension 30 mL  30 mL Oral Daily PRN Gilman Lade, NP       magnesium  oxide (MAG-OX) tablet 400 mg  400 mg Oral QHS Parker, Alvin S, MD   400 mg at 07/24/23 2122   metoprolol  succinate (TOPROL -XL) 24 hr tablet 50 mg  50 mg Oral Daily Parker, Alvin S, MD   50 mg at 07/25/23 0740   multivitamin with minerals tablet 1 tablet  1 tablet Oral Daily Amelie Jury, MD   1 tablet at 07/25/23 0740   naltrexone (DEPADE) tablet 25 mg  25 mg Oral Daily Parker, Alvin S, MD   25 mg at 07/25/23 0740   OLANZapine (ZYPREXA) injection 10 mg  10 mg Intramuscular TID PRN Byungura, Veronique M, NP       OLANZapine (ZYPREXA) injection 5 mg  5 mg  Intramuscular TID PRN Byungura, Veronique M, NP       OLANZapine zydis (ZYPREXA) disintegrating tablet 5 mg  5 mg Oral TID PRN Gilman Lade, NP   5 mg at 07/24/23 1630   potassium chloride  (KLOR-CON  M) CR tablet 10 mEq  10 mEq Oral Daily Parker, Alvin S, MD   10 mEq at 07/25/23 0740   simvastatin  (ZOCOR ) tablet 40 mg  40 mg Oral q1800 Parker, Alvin S, MD   40 mg at 07/24/23 1804   thiamine  (Vitamin B-1) tablet 100 mg  100 mg Oral Daily Tifini Reeder, Iline Mallory, MD   100 mg at 07/25/23 0739   traZODone (DESYREL) tablet 50 mg  50 mg Oral QHS PRN Byungura, Veronique M, NP   50 mg at 07/24/23 2122    Lab Results:  No results found for this or any previous visit (from the past 48 hours).   Blood Alcohol level:  Lab Results  Component Value Date   ETH 351 [facility]) 07/19/2023   ETH <10 05/23/2020    Metabolic Disorder Labs: Lab Results  Component Value Date   HGBA1C 5.3 07/21/2023   MPG 105.41 07/21/2023   No results found for: "PROLACTIN" Lab Results  Component Value Date   CHOL 215 (H) 07/21/2023   TRIG 87 07/21/2023   HDL >135 07/21/2023   CHOLHDL NOT CALCULATED 07/21/2023   VLDL 17 07/21/2023   LDLCALC NOT CALCULATED 07/21/2023   LDLCALC 65 04/27/2022    Physical Findings: AIMS:  , ,  ,  ,    CIWA:  CIWA-Ar Total: 3 COWS:     Musculoskeletal: Strength & Muscle Tone: Normal Gait & Station: Within normal limits Patient leans: Not applicable  Psychiatric Specialty Exam:  Presentation  General Appearance and behavior,:  Casually dressed, good rapport, not in any distress, not confused.  Eye Contact: Normal conjugate eye movement.  Good eye contact.  Speech: Spontaneous.  Normal rate, tone and volume.  Normal prosody of speech.  Mood and Affect  Mood: Euthymic.  Affect: Restricted and appropriate.  Thought Process  Thought Processes: Normal speed of thought.  Linear and goal directed.  Descriptions of Associations:Intact  Orientation:Full (Time, Place  and Person)  Thought Content: Future-oriented.  No current suicidal thoughts.  No homicidal thoughts.  No thoughts of violence.  No negative ruminative flooding.  No guilty ruminations.  No delusional theme.  No obsessions.  Hallucinations: No hallucination in any modality.  Sensorium  Memory: Fair.  Judgment: Fair.  Insight: Poor.  Executive Functions  Concentration: Good.  Attention Span: Good.    Recall: Fair.  Fund of Knowledge: Good.  Language: Good   Psychomotor Activity  Normal psychomotor activity    Physical Exam: Physical Exam ROS Blood pressure 125/82, pulse 80, temperature 98 F (36.7 C), temperature source Oral, resp. rate 17, height 5\' 6"  (1.676 m), weight 71.7 kg, SpO2 100%. Body mass index is 25.5 kg/m.   Treatment Plan Summary: Patient has completely detoxed from alcohol.  No evidence of depression.  No evidence of mania.  No evidence of psychosis.  He remains in denial about his addiction.  We are finalizing aftercare.  Scheduled for discharge tomorrow.   1.  Discontinue CIWA protocol 2.  Continue trazodone 50 mg at bedtime. 3.  Continue naltrexone 25 mg daily. 4.  Continue home medical medications at the same dose. 5.  Continue to monitor mood behavior and interaction with others. 6.  Continue to encourage unit groups and therapeutic activities. 7.  Social worker will coordinate discharge and aftercare planning.  Amelie Jury, MD 07/25/2023, 11:30 AM

## 2023-07-25 NOTE — Group Note (Signed)
 Occupational Therapy Group Note  Group Topic:Coping Skills  Group Date: 07/25/2023 Start Time: 1400 End Time: 1430 Facilitators: Lynnda Sas, OT   Group Description: Group encouraged increased engagement and participation through discussion and activity focused on "Coping Ahead." Patients were split up into teams and selected a card from a stack of positive coping strategies. Patients were instructed to act out/charade the coping skill for other peers to guess and receive points for their team. Discussion followed with a focus on identifying additional positive coping strategies and patients shared how they were going to cope ahead over the weekend while continuing hospitalization stay.  Therapeutic Goal(s): Identify positive vs negative coping strategies. Identify coping skills to be used during hospitalization vs coping skills outside of hospital/at home Increase participation in therapeutic group environment and promote engagement in treatment   Participation Level: Engaged   Participation Quality: Independent   Behavior: Appropriate   Speech/Thought Process: Relevant   Affect/Mood: Appropriate   Insight: Fair   Judgement: Fair      Modes of Intervention: Education  Patient Response to Interventions:  Attentive   Plan: Continue to engage patient in OT groups 2 - 3x/week.  07/25/2023  Lynnda Sas, OT  Christian King, OT

## 2023-07-25 NOTE — Group Note (Signed)
 LCSW Group Therapy Note   Group Date: 07/25/2023 Start Time: 1100 End Time: 1200  Participation:  patient was present   Type of Therapy:  Group Therapy  Topic:  Stress Less:  Nurturing Your Mind and Body Through Calm     Objective:  Learn techniques for managing stress through body relaxation, mindfulness, and self-compassion.  Goals: Use body relaxation techniques, such as Box Breathing and Progressive Muscle Relaxation, to reduce physical tension. Practice mindfulness to break the cycle of overthinking and mental chatter. Embrace self-compassion to handle stress with kindness and resilience.  Summary:  Today's session focused on calming the body with relaxation techniques, breaking the cycle of stress with mindfulness, and using self-compassion to manage challenges more gracefully. These tools help reduce stress and foster a balanced, peaceful mindset.  Therapeutic Modalities used:  Elements of CBT ( cognitive restructuring)  Elements of DBT (box breathing, progressive body relaxation, mindfulness, acceptance)    Christian King, LCSWA 07/25/2023  3:26 PM

## 2023-07-25 NOTE — [facility] Group Notes (Signed)
 Spirituality Group   Description: Participant directed exploration of values, beliefs and meaning   Following a brief framework of chaplain's role and ground rules of group behavior, participants are invited to share concerns or questions that engage spiritual life. Emphasis placed on common themes and shared experiences and ways to make meaning and clarify living into one's values.   Theory/Process/Goal: Utilize the theoretical framework of group therapy established by Derrell Flight, Relational Cultural Theory and Rogerian approaches to facilitate relational empathy and use of the "here and now" to foster reflection, self-awareness, and sharing.   Observations: Christian King was an active participant in the group discussion.  Christian King L. Minetta Aly, M.Div [phone]
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
## 2023-07-25 NOTE — Group Note (Signed)
 Date:  07/25/2023 Time:  0830  Group Topic/Focus:  Goals Group:   The focus of this group is to help patients establish daily goals to achieve during treatment and discuss how the patient can incorporate goal setting into their daily lives to aide in recovery.    Participation Level:  Active  Participation Quality:  Appropriate and Attentive  Affect:  Appropriate  Cognitive:  Alert and Appropriate  Insight: Appropriate and Good  Engagement in Group:  Engaged  Modes of Intervention:  dissuasion  Additional Comments:Pt find out why he is not going home.  Tita Form 07/25/2023, 2:59 PM

## 2023-07-26 DIAGNOSIS — F1020 Alcohol dependence, uncomplicated: Secondary | ICD-10-CM

## 2023-07-26 LAB — VITAMIN B1: Vitamin B1 (Thiamine): 189.3 nmol/L (ref 66.5–200.0)

## 2023-07-26 MED ORDER — NALTREXONE HCL 50 MG PO TABS: 25.0000 mg | ORAL_TABLET | Freq: Every day | ORAL | 0 refills | Status: DC

## 2023-07-26 NOTE — Discharge Summary (Signed)
 Physician Discharge Summary Note  Patient:  Christian King is an 53 y.o., male MRN:  [id number] DOB:  1970/12/25 Patient phone:  [phone] (home)  Patient address:   [address] [place] [address],  Total Time spent with patient: 45 minutes  Date of Admission:  07/20/2023 Date of Discharge: 07/26/2023  Reason for Admission:   53 year old Caucasian male, married, lives with his family.  Known history of alcohol use disorder and substance-induced mood disorder.  Patient was evaluated medically following a fall.  He was intoxicated with alcohol, BAL 351 mg/dl.  His wife reports daily pattern of drinking for the past nine weeks.  While under the influence has expressed thoughts of suicide by shooting himself in the head. Routine labs significant for chronic effects of alcohol (elevated AST, macrocytic anemia and hypokalemia).  CT head does not indicate any bleeding.    Principal Problem: Severe alcohol use disorder [facility]) Discharge Diagnoses: Principal Problem:   Severe alcohol use disorder (HCC) Active Problems:   Substance induced mood disorder (HCC)   Chronic hypokalemia   Hyperlipidemia   Past Psychiatric History:  Extensive history of alcohol use disorder.   Past Medical History:  Past Medical History:  Diagnosis Date   Alcohol abuse 07/24/2010   B12 deficiency    Blood dyscrasia    hemachromatosis   BMI 25.0-25.9,adult 01/28/2015   Bronchitis    Degenerative joint disease (DJD) of hip    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   Depression    Essential hypertension 04/26/2010   Qualifier: Diagnosis of   By: Nadean August         GERD (gastroesophageal reflux disease)    Gout    Gout 04/26/2010   Qualifier: Diagnosis of   By: Nadean August         Hemochromatosis 10/03/2013   History of DVT (deep vein thrombosis) 05/15/2016   History of kidney stones    HLD (hyperlipidemia)    HTN (hypertension)    Hyperlipidemia 04/26/2010    Qualifier: Diagnosis of   By: Nadean August         Hypokalemia 03/17/2016   Hypothyroidism    Insomnia 02/05/2014   QT prolongation 03/20/2015   Severe alcohol use disorder (HCC) 07/21/2023   Ventricular tachycardia (HCC)    in setting of hypokalemia/ hypomagnesemia and ETOH   Vitamin B12 deficiency 04/26/2010   Qualifier: Diagnosis of   By: Nadean August         Wears glasses     Past Surgical History:  Procedure Laterality Date   TOTAL HIP ARTHROPLASTY Right    TOTAL HIP ARTHROPLASTY Left 03/16/2016   Procedure: TOTAL HIP ARTHROPLASTY;  Surgeon: Marlena Sima, MD;  Location: [facility] OR;  Service: Orthopedics;  Laterality: Left;   vascularized fibular graft     right hip for avascular necrosis   Family History:  Family History  Problem Relation Age of Onset   Hypertension Father    Diabetes Maternal Grandmother    Hypertension Paternal Grandmother    Stroke Paternal Grandmother    Family Psychiatric  History:   Social History:  Social History   Substance and Sexual Activity  Alcohol Use Yes   Alcohol/week: 12.0 standard drinks of alcohol   Types: 12 Cans of beer per week   Comment: daily     Social History   Substance and Sexual Activity  Drug Use No    Social History   Socioeconomic History   Marital status: Married  Spouse name: Not on file   Number of children: Not on file   Years of education: Not on file   Highest education level: 12th grade  Occupational History   Not on file  Tobacco Use   Smoking status: Every Day    Current packs/day: 0.25    Average packs/day: 0.3 packs/day for 38.2 years (9.6 ttl pk-yrs)    Types: Cigarettes    Start date: 05/05/1985   Smokeless tobacco: Never  Vaping Use   Vaping status: Never Used  Substance and Sexual Activity   Alcohol use: Yes    Alcohol/week: 12.0 standard drinks of alcohol    Types: 12 Cans of beer per week    Comment: daily   Drug use: No   Sexual activity: Yes  Other Topics Concern    Not on file  Social History Narrative   Unemployed.  Previously a Automotive engineer   Social Drivers of Health   Financial Resource Strain: Low Risk  (11/27/2022)   Overall Financial Resource Strain (CARDIA)    Difficulty of Paying Living Expenses: Not very hard  Food Insecurity: No Food Insecurity (07/21/2023)   Hunger Vital Sign    Worried About Running Out of Food in the Last Year: Never true    Ran Out of Food in the Last Year: Never true  Transportation Needs: No Transportation Needs (07/21/2023)   PRAPARE - Administrator, Civil Service (Medical): No    Lack of Transportation (Non-Medical): No  Physical Activity: Insufficiently Active (11/27/2022)   Exercise Vital Sign    Days of Exercise per Week: 2 days    Minutes of Exercise per Session: 10 min  Stress: No Stress Concern Present (11/27/2022)   [employer] of Occupational Health - Occupational Stress Questionnaire    Feeling of Stress : Only a little  Social Connections: Unknown (07/21/2023)   Social Connection and Isolation Panel [NHANES]    Frequency of Communication with Friends and Family: More than three times a week    Frequency of Social Gatherings with Friends and Family: More than three times a week    Attends Religious Services: More than 4 times per year    Active Member of [employer] or Organizations: Patient declined    Attends Banker Meetings: Patient declined    Marital Status: Married    Hospital Course:   Patient was admitted on alcohol withdrawal protocol and suicide precautions.  He was placed on one-to-one due to repeated falls.  He had physical therapy and occupational therapy assessments.  Patient detoxed from alcohol without any complications on the unit.  His balance and gait got better with time.  We able to wean him off one-to-one after a couple of days.  Patient participated with some of the unit groups and therapeutic activities.  Sleep-wake cycle regularized back to  normal.  Other biological functions regularized back to normal.  His wife was very supportive during his hospital stay.  He did not require any psychiatric emergency measures.   Physical Findings: AIMS:  , ,  ,  ,    CIWA:  CIWA-Ar Total: 0 COWS:     Musculoskeletal: Strength & Muscle Tone: within normal limits Gait & Station: normal Patient leans: N/A   Psychiatric Specialty Exam:  Presentation  General Appearance and behavior:  Casually dressed, not in any distress, appropriate behavior, engaged politely.  No EPS.  Eye Contact: Good.  Speech: Spontaneous.  Normal rate, tone and volume.  Normal prosody  of speech.  Mood and Affect  Mood: Euthymic.  Affect: Full range and appropriate.  Thought Process  Thought Processes: Linear and goal directed.  Descriptions of Associations:Intact  Orientation:Full (Time, Place and Person)  Thought Content: Future oriented.  No current suicidal thoughts.  No homicidal thoughts.  No thoughts of violence.  No negative ruminative flooding.  No guilty ruminations.  No delusional theme.  No obsessions.  Hallucinations: No hallucination in any modality.  Sensorium  Memory: Good.  Judgment: Good.  Insight: Good  Executive Functions  Concentration: Good.  Attention Span: Good.  Recall: Good.  Fund of Knowledge: Good.  Language: Good   Psychomotor Activity  Normal psychomotor activity    Physical Exam: Physical Exam ROS Blood pressure 134/89, pulse 81, temperature 98 F (36.7 C), temperature source Oral, resp. rate 16, height 5\' 6"  (1.676 m), weight 71.7 kg, SpO2 100%. Body mass index is 25.5 kg/m.   Social History   Tobacco Use  Smoking Status Every Day   Current packs/day: 0.25   Average packs/day: 0.3 packs/day for 38.2 years (9.6 ttl pk-yrs)   Types: Cigarettes   Start date: 05/05/1985  Smokeless Tobacco Never   Tobacco Cessation:  N/A, patient does not currently use tobacco  products   Blood Alcohol level:  Lab Results  Component Value Date   ETH [facility]) 07/19/2023   ETH <10 05/23/2020    Metabolic Disorder Labs:  Lab Results  Component Value Date   HGBA1C 5.3 07/21/2023   MPG 105.41 07/21/2023   No results found for: "PROLACTIN" Lab Results  Component Value Date   CHOL 215 (H) 07/21/2023   TRIG 87 07/21/2023   HDL >135 07/21/2023   CHOLHDL NOT CALCULATED 07/21/2023   VLDL 17 07/21/2023   LDLCALC NOT CALCULATED 07/21/2023   LDLCALC 65 04/27/2022    See Psychiatric Specialty Exam and Suicide Risk Assessment completed by Attending Physician prior to discharge.  Discharge destination:  Home  Is patient on multiple antipsychotic therapies at discharge:  No   Has Patient had three or more failed trials of antipsychotic monotherapy by history:  No  Recommended Plan for Multiple Antipsychotic Therapies: NA  Discharge Instructions     Diet - low sodium heart healthy   Complete by: As directed    Increase activity slowly   Complete by: As directed       Allergies as of 07/26/2023       Reactions   Amoxicillin  Hives, Itching, Rash   Azithromycin  Other (See Comments)   History of QT prolongation   Hctz [hydrochlorothiazide] Rash   Tramadol Itching        Medication List     STOP taking these medications    magnesium  oxide 400 (240 Mg) MG tablet Commonly known as: MAG-OX   potassium chloride  10 MEQ tablet Commonly known as: KLOR-CON    simvastatin  40 MG tablet Commonly known as: ZOCOR    thiamine  100 MG tablet Commonly known as: VITAMIN B1       TAKE these medications      Indication  metoprolol  succinate 50 MG 24 hr tablet Commonly known as: TOPROL -XL Take 1 tablet (50 mg total) by mouth daily with or immediately following a meal.  Indication: High Blood Pressure   naltrexone 50 MG tablet Commonly known as: DEPADE Take 0.5 tablets (25 mg total) by mouth daily. Start taking on: July 27, 2023  Indication: Abuse or  Misuse of Alcohol        Follow-up Information  Delfina Feller, FNP. Go on 08/12/2023.   Specialty: Family Medicine Why: You have an appointment for medication management services on 08/12/23 at 8:00 am, in person. Contact information: [address] [place] [address] [phone]         [facility] Follow up.   Why: You may go to this provider for an assessment, if you wish to obtain therapy and medication management services. Contact information: [address] [place] [address] [phone]                 Follow-up recommendations:    Patient encouraged to abstain from alcohol.  No restrictions with respect to diet or activity from a psychiatric perspective.  Signed: Amelie Jury, MD 07/26/2023, 9:05 AM

## 2023-07-26 NOTE — BHH Group Notes (Signed)
 BHH Group Notes:  (Nursing/MHT/Case Management/Adjunct)  Date:  07/26/2023  Time:  4:20 AM  Type of Therapy:  Wrap-up group  Participation Level:  Active  Participation Quality:  Appropriate  Affect:  Appropriate  Cognitive:  Appropriate  Insight:  Appropriate  Engagement in Group:  Engaged  Modes of Intervention:  Education  Summary of Progress/Problems: Goal to have a positive attitude. Rated day 5/10.  Christian King 07/26/2023, 4:20 AM

## 2023-07-26 NOTE — Group Note (Signed)
 Recreation Therapy Group Note   Group Topic:Healthy Decision Making  Group Date: 07/26/2023 Start Time: 0931 End Time: 1000 Facilitators: Arlenis Blaydes-McCall, LRT,CTRS Location: [address] Dayroom   Group Topic: Communication, Team Building, Problem Solving   Goal Area(s) Addresses:  Patient will effectively work with peer towards shared goal.  Patient will identify skill used to make activity successful.  Patient will identify how skills used during activity can be used to reach post d/c goals.    Behavioral Response: Engaged   Intervention: STEM Activity    Activity: In team's, using 20 small plastic cups, patients were asked to build the tallest free standing tower possible.     Education: Pharmacist, community, Building control surveyor.    Education Outcome: Acknowledges education/In group clarification offered/Needs additional education.    Affect/Mood: Appropriate   Participation Level: Engaged   Participation Quality: Independent   Behavior: Appropriate   Speech/Thought Process: Focused   Insight: Good   Judgement: Good   Modes of Intervention: Competitive Play   Patient Response to Interventions:  Engaged   Education Outcome:  In group clarification offered    Clinical Observations/Individualized Feedback: Pt attended and participated in group session.      Plan: Continue to engage patient in RT group sessions 2-3x/week.   Carinne Brandenburger-McCall, LRT,CTRS 07/26/2023 12:08 PM

## 2023-07-26 NOTE — Progress Notes (Signed)
 Pt discharged at this time. Pt removed all belongings and verbalized understanding of medications and discharge instructions. Pt denies SI/HI/AVH.

## 2023-07-26 NOTE — Group Note (Signed)
 Date:  07/26/2023 Time:  10:55 AM  Group Topic/Focus:  Goals Group:   The focus of this group is to help patients establish daily goals to achieve during treatment and discuss how the patient can incorporate goal setting into their daily lives to aide in recovery.    Participation Level:  Active  Participation Quality:  Attentive  Affect:  Appropriate  Cognitive:  Alert and Appropriate  Insight: Appropriate and Good  Engagement in Group:  Engaged  Modes of Intervention:  Discussion  Additional Comments: Pt rate his morning a 10. Pt excited that he will be going home this morning. Pt goal today is to be discharge.  Tita Form 07/26/2023, 10:55 AM

## 2023-07-26 NOTE — Progress Notes (Signed)
  [facility] Adult Case Management Discharge Plan :  Will you be returning to the same living situation after discharge:  Yes,  pt is returning home at discharge At discharge, do you have transportation home?: Yes,  pt will be picked up by wife around 1130 Do you have the ability to pay for your medications: Yes,  pt has active health care insurance   Release of information consent forms completed and in the chart;  Patient's signature needed at discharge.  Patient to Follow up at:  Follow-up Information     Delfina Feller, FNP. Go on 08/12/2023.   Specialty: Family Medicine Why: You have an appointment for medication management services on 08/12/23 at 8:00 am, in person. Contact information: [address] [place] [address] [phone]         [facility] Follow up.   Why: You may go to this provider for an assessment, if you wish to obtain therapy and medication management services. Contact information: [address] [place] [address] [phone]                 Next level of care provider has access to [facility] Link:no  Safety Planning and Suicide Prevention discussed: Valri Gee,  Conner, Muegge (Spouse) [phone] AND Saunders Curio Sr. (father [phone])      Has patient been referred to the Quitline?: Patient refused referral for treatment  Patient has been referred for addiction treatment: Patient refused referral for treatment.  Vonzell Guerin, LCSWA 07/26/2023, 8:43 AM

## 2023-07-26 NOTE — [facility] Suicide Risk Assessment (Signed)
 [facility] Discharge Suicide Risk Assessment   Principal Problem: Severe alcohol use disorder (HCC) Discharge Diagnoses: Principal Problem:   Severe alcohol use disorder (HCC) Active Problems:   Substance induced mood disorder (HCC)   Chronic hypokalemia   Hyperlipidemia   Total Time spent with patient: 30 minutes  Musculoskeletal: Strength & Muscle Tone: within normal limits Gait & Station: normal Patient leans: N/A  Psychiatric Specialty Exam  Presentation  General Appearance and behavior:  Casually dressed, not in any distress, appropriate behavior, engaged politely.  No EPS.  Eye Contact: Good.  Speech: Spontaneous.  Normal rate, tone and volume.  Normal prosody of speech.  Mood and Affect  Mood: Euthymic.  Affect: Full range and appropriate.  Thought Process  Thought Processes: Linear and goal directed.  Descriptions of Associations:Intact  Orientation:Full (Time, Place and Person)  Thought Content: Future oriented.  No current suicidal thoughts.  No homicidal thoughts.  No thoughts of violence.  No negative ruminative flooding.  No guilty ruminations.  No delusional theme.  No obsessions.  Hallucinations: No hallucination in any modality.  Sensorium  Memory: Good.  Judgment: Good.  Insight: Good  Executive Functions  Concentration: Good.  Attention Span: Good.  Recall: Good.  Fund of Knowledge: Good.  Language: Good   Psychomotor Activity  Normal psychomotor activity   Physical Exam: Physical Exam ROS Blood pressure 134/89, pulse 81, temperature 98 F (36.7 C), temperature source Oral, resp. rate 16, height 5\' 6"  (1.676 m), weight 71.7 kg, SpO2 100%. Body mass index is 25.5 kg/m.  Mental Status Per Nursing Assessment::   On Admission:  NA  Demographic Factors:  Male and Caucasian  Loss Factors: NA  Historical Factors: Family history of mental illness or substance abuse  Risk Reduction Factors:   Sense of  responsibility to family, Employed, Living with another person, especially a relative, Positive social support, Positive therapeutic relationship, and Positive coping skills or problem solving skills  Continued Clinical Symptoms:  Alcohol/Substance Abuse/Dependencies  Cognitive Features That Contribute To Risk:  None    Suicide Risk:  Minimal: No current suicidal thoughts.  No current homicidal thoughts.  No current thoughts of violence.  Modifiable risk factor targeted during this admission as alcohol use disorder.  The patient detoxed from alcohol without any complications.  Unfortunately he has no motivation to get into a relapse prevention program.  No new psychosocial stressor. Patient is stable for care at the lower setting.   Follow-up Information     Delfina Feller, FNP. Go on 08/12/2023.   Specialty: Family Medicine Why: You have an appointment for medication management services on 08/12/23 at 8:00 am, in person. Contact information: [address] [place] [address] [phone]         [facility] Follow up.   Why: You may go to this provider for an assessment, if you wish to obtain therapy and medication management services. Contact information: [address] [place] [address] [phone]                 Plan Of Care/Follow-up recommendations:  See discharge summary.  Amelie Jury, MD 07/26/2023, 9:01 AM

## 2023-07-29 ENCOUNTER — Telehealth: Payer: Self-pay | Admitting: Family Medicine

## 2023-07-29 ENCOUNTER — Telehealth: Payer: Self-pay | Admitting: Nurse Practitioner

## 2023-07-29 NOTE — Telephone Encounter (Signed)
 Copied from CRM [id number]. Topic: Clinical - Medication Question >> Jul 29, 2023  8:11 AM Loreda Rodriguez T wrote: Reason for CRM: patient called requested a call from his provider or nurse about an expired medication. Please f/u with patient

## 2023-07-29 NOTE — Telephone Encounter (Signed)
 Spoke with PCP and she agreed that patient should be dismissed from practice. Please notify patient

## 2023-07-29 NOTE — Telephone Encounter (Signed)
 Other encounter routed to pcp

## 2023-07-29 NOTE — Telephone Encounter (Signed)
 Patient called to request a refill for medication 'Zypire'. Medication is not showing on active medication list. Will forward to practice for determination. Please advise.     Copied from CRM [id number]. Topic: Clinical - Medication Question >> Jul 29, 2023  1:41 PM Shardie S wrote: Reason for CRM: Patient requesting a refills on medication " Zypire". Unable to locate medication on current med list.

## 2023-07-29 NOTE — Telephone Encounter (Unsigned)
 Copied from CRM [id number]. Topic: General - Other >> Jul 29, 2023  1:38 PM Bearl Botts E wrote: Reason for CRM: Pt called and confessed to being slightly intoxicated, used profane/vulgar language including racial epithets like the n word. Just FYI for leadership.

## 2023-07-29 NOTE — Telephone Encounter (Signed)
Attempted to contact patient.  Mailbox full. Unable to leave message.

## 2023-07-29 NOTE — Telephone Encounter (Signed)
 We did not prescribe this. Where did he get this from the ED. If so will need appt to get filled.

## 2023-07-29 NOTE — Telephone Encounter (Signed)
 Copied from CRM [id number]. Topic: Clinical - Medication Question >> Jul 29, 2023 11:32 AM Kevelyn M wrote: Reason for CRM: Patient is following up about medication question about his expired medication. Please give the patient at [phone].

## 2023-07-29 NOTE — Telephone Encounter (Signed)
 Called and spoke with patient and he asked what Zyprexa  was for. Phone then disconnected. Attempted to contact him back with no answer

## 2023-07-29 NOTE — Telephone Encounter (Signed)
 Zyprexu is the name of the medication that he is calling about

## 2023-07-30 ENCOUNTER — Encounter: Payer: Self-pay | Admitting: Nurse Practitioner

## 2023-07-30 NOTE — Telephone Encounter (Signed)
 Dismissal letter mailed to patient.

## 2023-08-01 NOTE — Telephone Encounter (Signed)
 See other telephone encounter note

## 2023-08-08 ENCOUNTER — Telehealth: Payer: Self-pay | Admitting: Nurse Practitioner

## 2023-08-08 NOTE — Telephone Encounter (Signed)
 LMOVM to return call.

## 2023-08-08 NOTE — Telephone Encounter (Signed)
 Copied from CRM [id number]. Topic: General - Other >> Aug 08, 2023  8:14 AM Elle L wrote: Reason for CRM: The patient's wife states that the patient received a letter stating he was discharged from the practice due to his behavior to the staff and the patient's wife would like a call back regarding this. Her call back number is [phone].

## 2023-08-12 ENCOUNTER — Inpatient Hospital Stay: Payer: Self-pay | Admitting: Nurse Practitioner

## 2023-10-14 ENCOUNTER — Telehealth: Payer: Self-pay

## 2023-10-14 ENCOUNTER — Encounter: Admitting: Student in an Organized Health Care Education/Training Program

## 2023-10-14 NOTE — Telephone Encounter (Signed)
 Copied from CRM [id number]. Topic: Appointments - Scheduling Inquiry for Clinic >> Oct 14, 2023  2:26 PM Rea C wrote: Reason for CRM: Patient called in about his no-show appointment today with Dr. Jerrell. I advised that he is unable to reschedule due to no show today. Patient stated that [place] was [place] another [place] driving. He is asking if it's possible he could reschedule due to the reason he provided, and he wouldn't mind paying if it comes to that.   [phone] (M)

## 2023-10-14 NOTE — Telephone Encounter (Signed)
 Patient was informed of the policy and though he was upset he stated he was unaware of any way to cancel appointment I advised calling the office this morning or after hours line over the weekend, messaging in on MyChart or canceling appt through MyChart directly, noted his wife is a Haematologist member at [employer] advised she could assist with MyChart if he was having difficulties. Patient understanding and will find a new doctor.

## 2023-10-30 ENCOUNTER — Telehealth: Payer: Self-pay | Admitting: Family Medicine

## 2023-10-30 NOTE — Telephone Encounter (Unsigned)
 Copied from CRM [id number]. Topic: Appointments - Appointment Scheduling >> Oct 30, 2023  3:33 PM Tiffini S wrote: Patient called to schedule new patient appointment for 11/06/23- asked to be transferred from previous provider. States that he is low on blood pressure medication. Patient do not have active insurance and will be a self pay patient. Asked for a call back at [phone] for the cost of visit.

## 2023-11-01 NOTE — Telephone Encounter (Signed)
 Patient's mailbox is full. Sent patient a detailed message via MyChart.

## 2023-11-06 ENCOUNTER — Encounter: Admitting: Family Medicine

## 2023-11-09 ENCOUNTER — Emergency Department (HOSPITAL_COMMUNITY)
Admission: EM | Admit: 2023-11-09 | Discharge: 2023-11-10 | Disposition: A | Discharge status: Other Acute Inpatient Hospital | Attending: Emergency Medicine | Admitting: Emergency Medicine

## 2023-11-09 DIAGNOSIS — Z96643 Presence of artificial hip joint, bilateral: Secondary | ICD-10-CM | POA: Diagnosis not present

## 2023-11-09 DIAGNOSIS — I10 Essential (primary) hypertension: Secondary | ICD-10-CM | POA: Diagnosis not present

## 2023-11-09 DIAGNOSIS — S40011A Contusion of right shoulder, initial encounter: Secondary | ICD-10-CM | POA: Diagnosis not present

## 2023-11-09 DIAGNOSIS — Y92009 Unspecified place in unspecified non-institutional (private) residence as the place of occurrence of the external cause: Secondary | ICD-10-CM | POA: Insufficient documentation

## 2023-11-09 DIAGNOSIS — E039 Hypothyroidism, unspecified: Secondary | ICD-10-CM | POA: Diagnosis not present

## 2023-11-09 DIAGNOSIS — F1910 Other psychoactive substance abuse, uncomplicated: Secondary | ICD-10-CM | POA: Diagnosis not present

## 2023-11-09 DIAGNOSIS — R45851 Suicidal ideations: Secondary | ICD-10-CM | POA: Diagnosis not present

## 2023-11-09 DIAGNOSIS — W19XXXA Unspecified fall, initial encounter: Secondary | ICD-10-CM | POA: Diagnosis not present

## 2023-11-09 DIAGNOSIS — Y908 Blood alcohol level of 240 mg/100 ml or more: Secondary | ICD-10-CM | POA: Insufficient documentation

## 2023-11-09 DIAGNOSIS — R7401 Elevation of levels of liver transaminase levels: Secondary | ICD-10-CM

## 2023-11-09 DIAGNOSIS — F1994 Other psychoactive substance use, unspecified with psychoactive substance-induced mood disorder: Secondary | ICD-10-CM | POA: Diagnosis present

## 2023-11-09 DIAGNOSIS — F1020 Alcohol dependence, uncomplicated: Secondary | ICD-10-CM | POA: Diagnosis present

## 2023-11-09 DIAGNOSIS — F10920 Alcohol use, unspecified with intoxication, uncomplicated: Secondary | ICD-10-CM | POA: Insufficient documentation

## 2023-11-09 DIAGNOSIS — D72819 Decreased white blood cell count, unspecified: Secondary | ICD-10-CM | POA: Diagnosis not present

## 2023-11-09 DIAGNOSIS — D696 Thrombocytopenia, unspecified: Secondary | ICD-10-CM | POA: Insufficient documentation

## 2023-11-09 DIAGNOSIS — S0990XA Unspecified injury of head, initial encounter: Secondary | ICD-10-CM | POA: Diagnosis not present

## 2023-11-09 DIAGNOSIS — S0101XA Laceration without foreign body of scalp, initial encounter: Secondary | ICD-10-CM | POA: Diagnosis not present

## 2023-11-09 DIAGNOSIS — F10929 Alcohol use, unspecified with intoxication, unspecified: Secondary | ICD-10-CM | POA: Diagnosis not present

## 2023-11-10 ENCOUNTER — Encounter (HOSPITAL_COMMUNITY): Payer: Self-pay | Admitting: Psychiatry

## 2023-11-10 ENCOUNTER — Inpatient Hospital Stay
Admission: AD | Admit: 2023-11-10 | Discharge: 2023-11-15 | DRG: 897 | Disposition: A | Discharge status: Home / Self Care | Source: Intra-hospital | Attending: Psychiatry | Admitting: Psychiatry

## 2023-11-10 ENCOUNTER — Emergency Department (HOSPITAL_COMMUNITY)

## 2023-11-10 ENCOUNTER — Other Ambulatory Visit: Payer: Self-pay

## 2023-11-10 ENCOUNTER — Inpatient Hospital Stay: Admission: EM | Admit: 2023-11-10 | Source: Ambulatory Visit | Admitting: Psychiatry

## 2023-11-10 DIAGNOSIS — K219 Gastro-esophageal reflux disease without esophagitis: Secondary | ICD-10-CM | POA: Diagnosis present

## 2023-11-10 DIAGNOSIS — Z86718 Personal history of other venous thrombosis and embolism: Secondary | ICD-10-CM

## 2023-11-10 DIAGNOSIS — Z881 Allergy status to other antibiotic agents status: Secondary | ICD-10-CM

## 2023-11-10 DIAGNOSIS — S0101XA Laceration without foreign body of scalp, initial encounter: Secondary | ICD-10-CM | POA: Diagnosis not present

## 2023-11-10 DIAGNOSIS — E785 Hyperlipidemia, unspecified: Secondary | ICD-10-CM | POA: Diagnosis not present

## 2023-11-10 DIAGNOSIS — F32A Depression, unspecified: Secondary | ICD-10-CM | POA: Diagnosis not present

## 2023-11-10 DIAGNOSIS — F1020 Alcohol dependence, uncomplicated: Secondary | ICD-10-CM

## 2023-11-10 DIAGNOSIS — F17210 Nicotine dependence, cigarettes, uncomplicated: Secondary | ICD-10-CM | POA: Diagnosis not present

## 2023-11-10 DIAGNOSIS — Z8249 Family history of ischemic heart disease and other diseases of the circulatory system: Secondary | ICD-10-CM

## 2023-11-10 DIAGNOSIS — Z885 Allergy status to narcotic agent status: Secondary | ICD-10-CM

## 2023-11-10 DIAGNOSIS — Z823 Family history of stroke: Secondary | ICD-10-CM

## 2023-11-10 DIAGNOSIS — S0990XA Unspecified injury of head, initial encounter: Secondary | ICD-10-CM | POA: Diagnosis not present

## 2023-11-10 DIAGNOSIS — M47812 Spondylosis without myelopathy or radiculopathy, cervical region: Secondary | ICD-10-CM | POA: Diagnosis not present

## 2023-11-10 DIAGNOSIS — Z96643 Presence of artificial hip joint, bilateral: Secondary | ICD-10-CM | POA: Diagnosis present

## 2023-11-10 DIAGNOSIS — S40011A Contusion of right shoulder, initial encounter: Secondary | ICD-10-CM | POA: Diagnosis not present

## 2023-11-10 DIAGNOSIS — Z560 Unemployment, unspecified: Secondary | ICD-10-CM | POA: Diagnosis not present

## 2023-11-10 DIAGNOSIS — R251 Tremor, unspecified: Secondary | ICD-10-CM | POA: Diagnosis present

## 2023-11-10 DIAGNOSIS — F419 Anxiety disorder, unspecified: Secondary | ICD-10-CM | POA: Diagnosis not present

## 2023-11-10 DIAGNOSIS — Z888 Allergy status to other drugs, medicaments and biological substances status: Secondary | ICD-10-CM

## 2023-11-10 DIAGNOSIS — S3991XA Unspecified injury of abdomen, initial encounter: Secondary | ICD-10-CM | POA: Diagnosis not present

## 2023-11-10 DIAGNOSIS — S0093XA Contusion of unspecified part of head, initial encounter: Secondary | ICD-10-CM | POA: Diagnosis not present

## 2023-11-10 DIAGNOSIS — D696 Thrombocytopenia, unspecified: Secondary | ICD-10-CM | POA: Diagnosis not present

## 2023-11-10 DIAGNOSIS — R45851 Suicidal ideations: Secondary | ICD-10-CM | POA: Diagnosis not present

## 2023-11-10 DIAGNOSIS — F10129 Alcohol abuse with intoxication, unspecified: Secondary | ICD-10-CM | POA: Diagnosis not present

## 2023-11-10 DIAGNOSIS — M109 Gout, unspecified: Secondary | ICD-10-CM | POA: Diagnosis present

## 2023-11-10 DIAGNOSIS — S0191XA Laceration without foreign body of unspecified part of head, initial encounter: Secondary | ICD-10-CM | POA: Diagnosis not present

## 2023-11-10 DIAGNOSIS — F1994 Other psychoactive substance use, unspecified with psychoactive substance-induced mood disorder: Secondary | ICD-10-CM

## 2023-11-10 DIAGNOSIS — S299XXA Unspecified injury of thorax, initial encounter: Secondary | ICD-10-CM | POA: Diagnosis not present

## 2023-11-10 DIAGNOSIS — F1910 Other psychoactive substance abuse, uncomplicated: Secondary | ICD-10-CM | POA: Diagnosis not present

## 2023-11-10 DIAGNOSIS — E039 Hypothyroidism, unspecified: Secondary | ICD-10-CM | POA: Diagnosis not present

## 2023-11-10 DIAGNOSIS — I10 Essential (primary) hypertension: Secondary | ICD-10-CM | POA: Diagnosis not present

## 2023-11-10 DIAGNOSIS — Z79899 Other long term (current) drug therapy: Secondary | ICD-10-CM

## 2023-11-10 DIAGNOSIS — Z833 Family history of diabetes mellitus: Secondary | ICD-10-CM | POA: Diagnosis not present

## 2023-11-10 DIAGNOSIS — Z880 Allergy status to penicillin: Secondary | ICD-10-CM

## 2023-11-10 DIAGNOSIS — N3289 Other specified disorders of bladder: Secondary | ICD-10-CM | POA: Diagnosis not present

## 2023-11-10 DIAGNOSIS — F10188 Alcohol abuse with other alcohol-induced disorder: Principal | ICD-10-CM | POA: Diagnosis present

## 2023-11-10 DIAGNOSIS — F10920 Alcohol use, unspecified with intoxication, uncomplicated: Secondary | ICD-10-CM | POA: Diagnosis not present

## 2023-11-10 DIAGNOSIS — K8020 Calculus of gallbladder without cholecystitis without obstruction: Secondary | ICD-10-CM | POA: Diagnosis not present

## 2023-11-10 DIAGNOSIS — R220 Localized swelling, mass and lump, head: Secondary | ICD-10-CM | POA: Diagnosis not present

## 2023-11-10 DIAGNOSIS — S199XXA Unspecified injury of neck, initial encounter: Secondary | ICD-10-CM | POA: Diagnosis not present

## 2023-11-10 DIAGNOSIS — D72819 Decreased white blood cell count, unspecified: Secondary | ICD-10-CM | POA: Diagnosis not present

## 2023-11-10 LAB — CBC WITH DIFFERENTIAL/PLATELET
Abs Immature Granulocytes: 0 K/uL (ref 0.00–0.07)
Basophils Absolute: 0 K/uL (ref 0.0–0.1)
Basophils Relative: 1 %
Eosinophils Absolute: 0 K/uL (ref 0.0–0.5)
Eosinophils Relative: 0 %
HCT: 39.2 % (ref 39.0–52.0)
Hemoglobin: 13.8 g/dL (ref 13.0–17.0)
Immature Granulocytes: 0 %
Lymphocytes Relative: 26 %
Lymphs Abs: 0.8 K/uL (ref 0.7–4.0)
MCH: 36 pg — ABNORMAL HIGH (ref 26.0–34.0)
MCHC: 35.2 g/dL (ref 30.0–36.0)
MCV: 102.3 fL — ABNORMAL HIGH (ref 80.0–100.0)
Monocytes Absolute: 0.4 K/uL (ref 0.1–1.0)
Monocytes Relative: 13 %
Neutro Abs: 1.7 K/uL (ref 1.7–7.7)
Neutrophils Relative %: 60 %
Platelets: 66 K/uL — ABNORMAL LOW (ref 150–400)
RBC: 3.83 MIL/uL — ABNORMAL LOW (ref 4.22–5.81)
RDW: 14.4 % (ref 11.5–15.5)
WBC: 2.9 K/uL — ABNORMAL LOW (ref 4.0–10.5)
nRBC: 0 % (ref 0.0–0.2)

## 2023-11-10 LAB — RAPID URINE DRUG SCREEN, HOSP PERFORMED
Amphetamines: NOT DETECTED
Barbiturates: NOT DETECTED
Benzodiazepines: NOT DETECTED
Cocaine: NOT DETECTED
Opiates: NOT DETECTED
Tetrahydrocannabinol: NOT DETECTED

## 2023-11-10 LAB — ETHANOL
Alcohol, Ethyl (B): >450 mg/dL (ref <15)
Alcohol, Ethyl (B): 381 mg/dL (ref <15)

## 2023-11-10 LAB — COMPREHENSIVE METABOLIC PANEL WITH GFR
ALT: 26 U/L (ref 0–44)
AST: 69 U/L — ABNORMAL HIGH (ref 15–41)
Albumin: 3.4 g/dL — ABNORMAL LOW (ref 3.5–5.0)
Alkaline Phosphatase: 51 U/L (ref 38–126)
Anion gap: 16 — ABNORMAL HIGH (ref 5–15)
BUN: 8 mg/dL (ref 6–20)
CO2: 24 mmol/L (ref 22–32)
Calcium: 8.4 mg/dL — ABNORMAL LOW (ref 8.9–10.3)
Chloride: 103 mmol/L (ref 98–111)
Creatinine, Ser: 0.75 mg/dL (ref 0.61–1.24)
GFR, Estimated: >60 mL/min (ref >60)
Glucose, Bld: 99 mg/dL (ref 70–99)
Potassium: 3.6 mmol/L (ref 3.5–5.1)
Sodium: 143 mmol/L (ref 135–145)
Total Bilirubin: 0.6 mg/dL (ref 0.0–1.2)
Total Protein: 6.6 g/dL (ref 6.5–8.1)

## 2023-11-10 LAB — MAGNESIUM: Magnesium: 1.5 mg/dL — ABNORMAL LOW (ref 1.7–2.4)

## 2023-11-10 MED ORDER — ONDANSETRON 4 MG PO TBDP: 4.0000 mg | ORAL_TABLET | Freq: Four times a day (QID) | ORAL | Status: AC | PRN

## 2023-11-10 MED ORDER — ALUM & MAG HYDROXIDE-SIMETH 200-200-20 MG/5ML PO SUSP: 30.0000 mL | Freq: Four times a day (QID) | ORAL | Status: DC | PRN

## 2023-11-10 MED ORDER — MAGNESIUM HYDROXIDE 400 MG/5ML PO SUSP: 30.0000 mL | Freq: Every day | ORAL | Status: DC | PRN

## 2023-11-10 MED ORDER — DIPHENHYDRAMINE HCL 25 MG PO CAPS: 50.0000 mg | ORAL_CAPSULE | Freq: Three times a day (TID) | ORAL | Status: DC | PRN

## 2023-11-10 MED ORDER — ALUM & MAG HYDROXIDE-SIMETH 200-200-20 MG/5ML PO SUSP: 30.0000 mL | ORAL | Status: DC | PRN

## 2023-11-10 MED ORDER — LOPERAMIDE HCL 2 MG PO CAPS: 2.0000 mg | ORAL_CAPSULE | ORAL | Status: AC | PRN

## 2023-11-10 MED ORDER — HALOPERIDOL LACTATE 5 MG/ML IJ SOLN: 5.0000 mg | Freq: Three times a day (TID) | INTRAMUSCULAR | Status: DC | PRN

## 2023-11-10 MED ORDER — LORAZEPAM 1 MG PO TABS
1.0000 mg | ORAL_TABLET | Freq: Four times a day (QID) | ORAL | Status: DC | PRN
  Administered 2023-11-11: 1 mg via ORAL
  Filled 2023-11-10: qty 1

## 2023-11-10 MED ORDER — LORAZEPAM 2 MG/ML IJ SOLN: 0.0000 mg | Freq: Two times a day (BID) | INTRAMUSCULAR | Status: DC

## 2023-11-10 MED ORDER — HALOPERIDOL LACTATE 5 MG/ML IJ SOLN: 10.0000 mg | Freq: Three times a day (TID) | INTRAMUSCULAR | Status: DC | PRN

## 2023-11-10 MED ORDER — ACETAMINOPHEN 325 MG PO TABS: 650.0000 mg | ORAL_TABLET | ORAL | Status: DC | PRN

## 2023-11-10 MED ORDER — LORAZEPAM 1 MG PO TABS
1.0000 mg | ORAL_TABLET | Freq: Four times a day (QID) | ORAL | Status: DC
  Administered 2023-11-11 (×2): 1 mg via ORAL
  Filled 2023-11-10 (×2): qty 1

## 2023-11-10 MED ORDER — LORAZEPAM 1 MG PO TABS: 0.0000 mg | ORAL_TABLET | Freq: Two times a day (BID) | ORAL | Status: DC

## 2023-11-10 MED ORDER — DIPHENHYDRAMINE HCL 50 MG/ML IJ SOLN: 50.0000 mg | Freq: Three times a day (TID) | INTRAMUSCULAR | Status: DC | PRN

## 2023-11-10 MED ORDER — LORAZEPAM 1 MG PO TABS: 0.0000 mg | ORAL_TABLET | Freq: Four times a day (QID) | ORAL | Status: DC

## 2023-11-10 MED ORDER — THIAMINE HCL 100 MG/ML IJ SOLN
100.0000 mg | Freq: Once | INTRAMUSCULAR | Status: AC
  Administered 2023-11-11: 100 mg via INTRAMUSCULAR
  Filled 2023-11-10: qty 2

## 2023-11-10 MED ORDER — ADULT MULTIVITAMIN W/MINERALS CH
1.0000 | ORAL_TABLET | Freq: Every day | ORAL | Status: DC
Start: 2023-11-11 — End: 2023-11-15
  Administered 2023-11-11 – 2023-11-15 (×5): 1 via ORAL
  Filled 2023-11-10 (×5): qty 1

## 2023-11-10 MED ORDER — HYDROXYZINE HCL 25 MG PO TABS: 25.0000 mg | ORAL_TABLET | Freq: Four times a day (QID) | ORAL | Status: AC | PRN

## 2023-11-10 MED ORDER — IOHEXOL 300 MG/ML  SOLN
100.0000 mL | Freq: Once | INTRAMUSCULAR | Status: AC | PRN
  Administered 2023-11-10: 100 mL via INTRAVENOUS

## 2023-11-10 MED ORDER — HALOPERIDOL 5 MG PO TABS: 5.0000 mg | ORAL_TABLET | Freq: Three times a day (TID) | ORAL | Status: DC | PRN

## 2023-11-10 MED ORDER — THIAMINE HCL 100 MG/ML IJ SOLN: 100.0000 mg | Freq: Every day | INTRAMUSCULAR | Status: DC

## 2023-11-10 MED ORDER — LORAZEPAM 1 MG PO TABS
1.0000 mg | ORAL_TABLET | Freq: Every day | ORAL | Status: DC
Start: 2023-11-15 — End: 2023-11-16

## 2023-11-10 MED ORDER — POTASSIUM CHLORIDE CRYS ER 20 MEQ PO TBCR
40.0000 meq | EXTENDED_RELEASE_TABLET | Freq: Once | ORAL | Status: AC
Start: 2023-11-10 — End: 2023-11-10
  Administered 2023-11-10: 40 meq via ORAL
  Filled 2023-11-10: qty 2

## 2023-11-10 MED ORDER — LORAZEPAM 2 MG/ML IJ SOLN: 0.0000 mg | Freq: Four times a day (QID) | INTRAMUSCULAR | Status: DC

## 2023-11-10 MED ORDER — LORAZEPAM 1 MG PO TABS: 1.0000 mg | ORAL_TABLET | Freq: Two times a day (BID) | ORAL | Status: DC

## 2023-11-10 MED ORDER — THIAMINE MONONITRATE 100 MG PO TABS
100.0000 mg | ORAL_TABLET | Freq: Every day | ORAL | Status: DC
  Administered 2023-11-10: 100 mg via ORAL
  Filled 2023-11-10: qty 1

## 2023-11-10 MED ORDER — NALTREXONE HCL 50 MG PO TABS
25.0000 mg | ORAL_TABLET | Freq: Every day | ORAL | Status: DC
  Administered 2023-11-10: 25 mg via ORAL
  Filled 2023-11-10 (×2): qty 1

## 2023-11-10 MED ORDER — LORAZEPAM 2 MG/ML IJ SOLN: 2.0000 mg | Freq: Three times a day (TID) | INTRAMUSCULAR | Status: DC | PRN

## 2023-11-10 MED ORDER — LORAZEPAM 1 MG PO TABS: 1.0000 mg | ORAL_TABLET | Freq: Three times a day (TID) | ORAL | Status: DC

## 2023-11-10 MED ORDER — ONDANSETRON HCL 4 MG PO TABS: 4.0000 mg | ORAL_TABLET | Freq: Three times a day (TID) | ORAL | Status: DC | PRN

## 2023-11-10 MED ORDER — METOPROLOL SUCCINATE ER 50 MG PO TB24: 50.0000 mg | ORAL_TABLET | Freq: Every day | ORAL | Status: DC

## 2023-11-10 MED ORDER — NICOTINE 7 MG/24HR TD PT24
7.0000 mg | MEDICATED_PATCH | Freq: Every day | TRANSDERMAL | Status: DC
  Filled 2023-11-10: qty 1

## 2023-11-10 MED ORDER — ACETAMINOPHEN 325 MG PO TABS
650.0000 mg | ORAL_TABLET | Freq: Four times a day (QID) | ORAL | Status: DC | PRN
  Administered 2023-11-11 – 2023-11-14 (×5): 650 mg via ORAL
  Filled 2023-11-10 (×5): qty 2

## 2023-11-10 MED ORDER — MAGNESIUM SULFATE 2 GM/50ML IV SOLN
2.0000 g | Freq: Once | INTRAVENOUS | Status: AC
  Administered 2023-11-10: 2 g via INTRAVENOUS
  Filled 2023-11-10: qty 50

## 2023-11-10 MED ORDER — NICOTINE 21 MG/24HR TD PT24
21.0000 mg | MEDICATED_PATCH | Freq: Every day | TRANSDERMAL | Status: DC
  Filled 2023-11-10 (×2): qty 1

## 2023-11-10 MED ORDER — THIAMINE MONONITRATE 100 MG PO TABS
100.0000 mg | ORAL_TABLET | Freq: Every day | ORAL | Status: DC
  Administered 2023-11-12 – 2023-11-15 (×4): 100 mg via ORAL
  Filled 2023-11-10 (×5): qty 1

## 2023-11-10 NOTE — ED Notes (Signed)
Pt speaking with TTS at this time.  

## 2023-11-10 NOTE — ED Notes (Signed)
 Pt in scanner. Wife called stating she just left magistrate's office. Deputy should be there soon.

## 2023-11-10 NOTE — ED Notes (Signed)
 Family at bedside.

## 2023-11-10 NOTE — ED Notes (Signed)
  at bedside

## 2023-11-10 NOTE — ED Notes (Signed)
 Patient transported to CT

## 2023-11-10 NOTE — ED Notes (Addendum)
 1100 - Round check is complete at this time. Pt is resting with no s/s of distress.   1200 - Round check is complete at this time. Pt is resting with no s/s of distress.   1300 - Round check is complete at this time. Pt is resting with no s/s of distress.  The secretary notified this nurse the pt's O2 keeps dropping. Pt was placed on 2 L [place]. Pt does not have any complaints at this time.  1400 - Round check is complete at this time. Pt is resting with no s/s of distress.   1500 - Round check is complete at this time. Pt denies needing anything like food, drink or bathroom. Pt has no s/s of distress. Pt is speaking to psych via video  1600 - Round check is complete at this time. Pt is resting with no s/s of distress.   1700 - Round check is complete at this time. Pt is resting with no s/s of distress.   1730 - Pt woke up to go to the restroom. Pt was able to put clothes on and walk to the restroom with no assistance. Pt asked if he can go home so he can take care of bills and take care of the house. Pt ate some grapes from the dinner tray.   1800 - Round check is complete at this time. Pt denies needing anything like food, drink or bathroom. Pt has no s/s of distress.  8182 - Ambulation trial completed at this time. Look at other note.   1900 - Round check is complete at this time. Pt denies needing anything like food, drink or bathroom. Pt has no s/s of distress. Pt is watching TV calmly and quietly in his room.

## 2023-11-10 NOTE — ED Provider Notes (Signed)
 Emergency Medicine Observation Re-evaluation Note  Christian King is a 53 y.o. male, seen on rounds today.  Pt initially presented to the ED for complaints of Fall Currently, the patient is asleep.  Pt came in last night with SI and heavy alcohol use.  TTS was unable to see him due to significant intox.  CIWA protocol ordered, but pt is currently not showing any signs of w/dr.  Physical Exam  BP 107/71   Pulse 64   Temp 98.2 F (36.8 C) (Oral)   Resp 17   SpO2 (!) 87%  Physical Exam General: asleep Cardiac: rr Lungs: clear Psych: asleep  ED Course / MDM  EKG:EKG Interpretation Date/Time:  Sunday November 10 2023 00:37:00 EDT Ventricular Rate:  63 PR Interval:    QRS Duration:  90 QT Interval:  430 QTC Calculation: 441 R Axis:   105  Text Interpretation: Sinus rhythm Right axis deviation Abnormal T, consider ischemia, diffuse leads Artifact When compared with ECG of 07/19/2023, No significant change was found Confirmed by Raford Lenis ([id number]) on 11/10/2023 3:05:20 AM  I have reviewed the labs performed to date as well as medications administered while in observation.  Recent changes in the last 24 hours include ED eval.  Plan  Current plan is for TTS eval.    Dean Clarity, MD 11/10/23 [phone]

## 2023-11-10 NOTE — ED Notes (Signed)
 Ambulation trial completed at this time. Pt was able to walk around the nurses station with no assistance.   O2 maintained 92%-96%, HR of 90-102

## 2023-11-10 NOTE — ED Notes (Signed)
 TTS finished patient in the bed watching tv/resting

## 2023-11-10 NOTE — ED Notes (Signed)
 Attempted to give report to [facility]- was told to call back in 20 minutes.

## 2023-11-10 NOTE — Progress Notes (Signed)
 [facility] LCSW Progress Note   11/10/2023    6:18 PM  Christian King   [id number]   Type of Contact and Topic:  Psychiatric Bed Placement   Pt accepted to [facility]    Patient meets inpatient criteria per [name], NP    The attending provider will be Dr. Hellen  Call report to [phone]  Tinnie Creighton, RN @ [facility] notified.     Pt scheduled  to arrive at [facility] TODAY @ 2000.  Adryana Mogensen, MSW, LCSW-A  6:20 PM 11/10/2023

## 2023-11-10 NOTE — ED Triage Notes (Signed)
 Pt bib [facility] from home for a fall, unknown LOC. Hematoma noted to head and laceration right side of head, bleeding controlled. No blood thinners. ETOH on board, per EMS pt stated he has had about a gallon of liquor. EMS states that family at scene states that pt had told them that he was going to blow his head off because he wants to die   Arrives in C-collar placed by EMS

## 2023-11-10 NOTE — ED Notes (Signed)
 Report given to Titus, RN at [facility].

## 2023-11-10 NOTE — ED Provider Notes (Addendum)
 [place] EMERGENCY DEPARTMENT AT [facility] Provider Note   CSN: [id number] Arrival date & time: 11/09/23  2355     Patient presents with: Christian King is a 53 y.o. male.   The history is provided by the patient and the spouse. The history is limited by the condition of the patient (Intoxicated).  Fall   He has history of hypertension, hyperlipidemia, ventricular tachycardia, alcohol use disorder and comes in following a fall at home.  Per his wife, he drank half a gallon of vodka and fell suffering a laceration to his right parietal scalp.  He also has been threatening suicide.  Patient is denying this.  Spouse states that he has not been taking his prescribed medications, patient denies this but does admit that he is not taking metoprolol  because he states he ran out.    Prior to Admission medications   Medication Sig Start Date End Date Taking? Authorizing Provider  metoprolol  succinate (TOPROL -XL) 50 MG 24 hr tablet Take 1 tablet (50 mg total) by mouth daily with or immediately following a meal. 11/20/22   Gladis, Mary-Margaret, FNP  naltrexone  (DEPADE) 50 MG tablet Take 0.5 tablets (25 mg total) by mouth daily. 07/27/23   Hinda Jerrell LABOR, MD    Allergies: Amoxicillin , Azithromycin , Hctz [hydrochlorothiazide], and Tramadol    Review of Systems  Unable to perform ROS: Mental status change    Updated Vital Signs BP 108/73   Pulse 63   Temp 98.2 F (36.8 C) (Oral)   SpO2 (!) 88%   Physical Exam Vitals and nursing note reviewed.   53 year old male, resting comfortably and in no acute distress. Vital signs are normal. Oxygen saturation is 88%, which is hypoxic. Head is normocephalic.  Minor abrasions are noted in the frontal scalp, laceration is present in the right parietal scalp. PERRLA, EOMI.  Neck is immobilized in a stiff cervical collar and is nontender. Back is nontender and there is no CVA tenderness. Lungs are clear without rales,  wheezes, or rhonchi. Chest is nontender.  Minor abrasion and ecchymosis is noted in the right subclavicular area. Heart has regular rate and rhythm without murmur. Abdomen is soft, flat, nontender. Extremities have no cyanosis or edema, full range of motion is present. Skin is warm and dry without rash. Neurologic: Clinically intoxicated with slurred speech, but objectively oriented x 3.  He apparently has no memory of his fall.  Strength is 5/5 in all 4 extremities.        (all labs ordered are listed, but only abnormal results are displayed) Labs Reviewed  COMPREHENSIVE METABOLIC PANEL WITH GFR - Abnormal; Notable for the following components:      Result Value   Calcium  8.4 (*)    Albumin 3.4 (*)    AST 69 (*)    Anion gap 16 (*)    All other components within normal limits  ETHANOL - Abnormal; Notable for the following components:   Alcohol, Ethyl (B) >450 (*)    All other components within normal limits  CBC WITH DIFFERENTIAL/PLATELET - Abnormal; Notable for the following components:   WBC 2.9 (*)    RBC 3.83 (*)    MCV 102.3 (*)    MCH 36.0 (*)    Platelets 66 (*)    All other components within normal limits  MAGNESIUM  - Abnormal; Notable for the following components:   Magnesium  1.5 (*)    All other components within normal limits  RAPID URINE  DRUG SCREEN, HOSP PERFORMED    EKG: EKG Interpretation Date/Time:  Sunday November 10 2023 00:37:00 EDT Ventricular Rate:  63 PR Interval:    QRS Duration:  90 QT Interval:  430 QTC Calculation: 441 R Axis:   105  Text Interpretation: Sinus rhythm Right axis deviation Abnormal T, consider ischemia, diffuse leads Artifact When compared with ECG of 07/19/2023, No significant change was found Confirmed by Raford Lenis ([id number]) on 11/10/2023 3:05:20 AM  Radiology: CT Head Wo Contrast Result Date: 11/10/2023 EXAM: CT HEAD WITHOUT CONTRAST 11/10/2023 01:29:22 AM TECHNIQUE: CT of the head was performed without the  administration of intravenous contrast. Automated exposure control, iterative reconstruction, and/or weight based adjustment of the mA/kV was utilized to reduce the radiation dose to as low as reasonably achievable. COMPARISON: 07/19/2023 CLINICAL HISTORY: Head trauma, moderate-severe. Pt bib [facility] from home for a fall, unknown LOC. Hematoma noted to head and laceration right side of head, bleeding controlled. No blood thinners. ETOH on board, per EMS pt stated he has had about a gallon of liquor. EMS states that family at scene; states that pt had told them that he was going to blow his head off because he wants to die. FINDINGS: BRAIN AND VENTRICLES: No acute hemorrhage. No evidence of acute infarct. No hydrocephalus. No extra-axial collection. No mass effect or midline shift. Mild global cortical atrophy. ORBITS: No acute abnormality. SINUSES: No acute abnormality. SOFT TISSUES AND SKULL: Mild soft tissue swelling overlying the right frontal bone. No skull fracture. IMPRESSION: 1. No acute intracranial abnormality. 2. Mild soft tissue swelling overlying the right frontal bone. Electronically signed by: Pinkie Pebbles MD 11/10/2023 01:40 AM EDT RP Workstation: [id number]   CT Cervical Spine Wo Contrast Result Date: 11/10/2023 EXAM: CT CERVICAL SPINE WITHOUT CONTRAST 11/10/2023 01:29:22 AM TECHNIQUE: CT of the cervical spine was performed without the administration of intravenous contrast. Multiplanar reformatted images are provided for review. Automated exposure control, iterative reconstruction, and/or weight based adjustment of the mA/kV was utilized to reduce the radiation dose to as low as reasonably achievable. COMPARISON: None available. CLINICAL HISTORY: Neck trauma, dangerous injury mechanism (Age 89-62y). Pt bib [facility] from home for a fall, unknown LOC. Hematoma noted to head and laceration right side of head, bleeding controlled. No blood thinners. ETOH on board, per EMS pt stated he has had  about a gallon of liquor. EMS states that family at scene; states that pt had told them that he was going to blow his head off because he wants to die. FINDINGS: CERVICAL SPINE: BONES AND ALIGNMENT: No acute fracture or traumatic malalignment. DEGENERATIVE CHANGES: Mild degenerative changes, most prominent at C3-4 and C6-7. SOFT TISSUES: No prevertebral soft tissue swelling. IMPRESSION: 1. No acute abnormality of the cervical spine. Electronically signed by: Pinkie Pebbles MD 11/10/2023 01:38 AM EDT RP Workstation: [id number]   CT CHEST ABDOMEN PELVIS W CONTRAST Result Date: 11/10/2023 EXAM: CT CHEST, ABDOMEN AND PELVIS WITH CONTRAST 11/10/2023 01:29:22 AM TECHNIQUE: CT of the chest, abdomen and pelvis was performed with the administration of intravenous contrast. Multiplanar reformatted images are provided for review. Automated exposure control, iterative reconstruction, and/or weight based adjustment of the mA/kV was utilized to reduce the radiation dose to as low as reasonably achievable. COMPARISON: None available. CLINICAL HISTORY: Polytrauma, blunt. Pt bib [facility] from home for a fall, unknown LOC. Hematoma noted to head and laceration right side of head, bleeding controlled. No blood thinners. ETOH on board, per EMS pt stated he has had about a gallon of liquor.  EMS states that family at scene; states that pt had told them that he was going to blow his head off because he wants to die. FINDINGS: CHEST: MEDIASTINUM AND LYMPH NODES: Heart and pericardium are unremarkable. The central airways are clear. No mediastinal, hilar or axillary lymphadenopathy. No evidence of traumatic aortic injury. Mild thoracic aortic atherosclerosis. Mild coronary atherosclerosis of the left circumflex. LUNGS AND PLEURA: Mild patchy opacities in the bilateral lower lobes, favoring atelectasis and/or postinfectious/inflammatory scarring. No pleural effusion or pneumothorax. ABDOMEN AND PELVIS: LIVER: The liver is  unremarkable. GALLBLADDER AND BILE DUCTS: Layering tiny gallstone (image 62), without associated inflammatory changes. No biliary ductal dilatation. SPLEEN: No acute abnormality. PANCREAS: No acute abnormality. ADRENAL GLANDS: No acute abnormality. KIDNEYS, URETERS AND BLADDER: No stones in the kidneys or ureters. No hydronephrosis. No perinephric or periureteral stranding. Moderately distended bladder. GI AND BOWEL: Normal appendix (image 86). Stomach demonstrates no acute abnormality. There is no bowel obstruction. REPRODUCTIVE ORGANS: Prostate is unremarkable. PERITONEUM AND RETROPERITONEUM: No hemoperitoneum or free air. VASCULATURE: Atherosclerotic calcifications of the abdominal aorta and branch vessels. ABDOMINAL AND PELVIS LYMPH NODES: No lymphadenopathy. REPRODUCTIVE ORGANS: No acute abnormality. BONES AND SOFT TISSUES: No fracture is seen. Sternum, clavicles, and scapulae are intact. Thoracolumbar spine is within normal limits. Bilateral hip arthroplasties. IMPRESSION: 1. No traumatic injury to the chest, abdomen, or pelvis. Electronically signed by: Pinkie Pebbles MD 11/10/2023 01:37 AM EDT RP Workstation: [id number]     .Laceration Repair  Date/Time: 11/10/2023 4:28 AM  Performed by: Raford Lenis, MD Authorized by: Raford Lenis, MD   Consent:    Consent obtained:  Verbal   Consent given by:  Patient   Risks discussed:  Infection, pain and poor cosmetic result   Alternatives discussed:  No treatment Universal protocol:    Procedure explained and questions answered to patient or proxy's satisfaction: yes     Relevant documents present and verified: yes     Test results available: yes     Imaging studies available: yes     Required blood products, implants, devices, and special equipment available: yes     Site/side marked: yes     Immediately prior to procedure, a time out was called: yes     Patient identity confirmed:  Verbally with patient and arm band Anesthesia:    Anesthesia  method:  None Laceration details:    Location:  Scalp   Scalp location:  R parietal   Length (cm):  8   Depth (mm):  3 Pre-procedure details:    Preparation:  Patient was prepped and draped in usual sterile fashion and imaging obtained to evaluate for foreign bodies Exploration:    Limited defect created (wound extended): no     Hemostasis achieved with:  Direct pressure   Imaging obtained: x-ray     Imaging outcome: foreign body not noted     Wound extent: no foreign body and no underlying fracture     Contaminated: no   Treatment:    Area cleansed with:  Saline   Amount of cleaning:  Standard   Debridement:  None   Undermining:  None Skin repair:    Repair method:  Staples   Number of staples:  11 Approximation:    Approximation:  Close Repair type:    Repair type:  Simple Post-procedure details:    Dressing:  Open (no dressing)    Medications Ordered in the ED - No data to display  Medical Decision Making Amount and/or Complexity of Data Reviewed Labs: ordered. Radiology: ordered.  Risk OTC drugs. Prescription drug management.   Fall with scalp laceration and apparent minor chest trauma.  However, in the setting of extreme intoxication, do not feel physical exam is a reliable way to exclude significant trauma.  I have ordered full trauma scans.  I have reviewed his past records, and note hospitalization on 07/20/2023 for suicidal ideation while on intoxicated.  He will need TTS evaluation once he is more sober.  Tdap booster was given on 11/24/2020.  I have reviewed his laboratory tests, and my interpretation is negative drug screen, elevated AST likely secondary to ethanol abuse, ethanol level markedly elevated consistent with ethanol intoxication, stable leukopenia and thrombocytopenia likely secondary to chronic ethanol use, hypomagnesemia.  I have ordered some intravenous magnesium .  CT of head shows no acute intracranial  abnormality, CT cervical spine shows no acute injury, CT of chest/abdomen/pelvis showed no acute traumatic injury.  I have independently viewed all of the images, and agree with the radiologist's interpretation.  I have put staples in the scalp laceration.  Patient has been observed in the ED, is now awake and alert although obviously still intoxicated.  He is now conversant enough to be able to engage for TTS consultation.  His wife has filed involuntary commitment papers stating that he had threatened to obtain a gun and shoot himself.  I have completed the involuntary commitment first exam.  I have discussed with the TTS, they need him to be more sober before they can do their evaluation.     Final diagnoses:  Fall at home, initial encounter  Scalp laceration, initial encounter  Alcohol intoxication, uncomplicated (HCC)  Suicidal ideation  Hypomagnesemia  Elevated AST (SGOT)  Leukopenia, unspecified type  Thrombocytopenia [facility])    ED Discharge Orders     None          Raford Lenis, MD 11/10/23 0451   Ethanol level was repeated at 06 17 and was still significantly elevated at 381.  He will need to be observed in the emergency department until he is more sober to allow for TTS consultation.  Case is signed out to Dr. Josephine.   Raford Lenis, MD 11/10/23 [phone]

## 2023-11-10 NOTE — ED Notes (Signed)
 Pt very upset that he was not told he was going to [facility] and was never told he wouldn't be d/c. Pt reports he has bills that need to be paid and dogs to take care of at home. Pt given a phone to make phone calls to help his situation but pt is aware he is still under IVC and still has to go to [facility].

## 2023-11-10 NOTE — Consult Note (Cosign Needed Addendum)
 [facility] Psychiatric Consult Initial  Patient Name: .Christian King  MRN: [id number]  DOB: Mar 30, 1970  Consult Order details:  Orders (From admission, onward)     Start     Ordered   11/10/23 0729  CONSULT TO CALL ACT TEAM       Ordering Provider: Dean Clarity, MD  Provider:  (Not yet assigned)  Question:  Reason for Consult?  Answer:  Psych consult   11/10/23 0728             Mode of Visit: In person    Psychiatry Consult Evaluation  Service Date: November 10, 2023 LOS:  LOS: 0 days  Chief Complaint: Suicidal ideations   Primary Psychiatric Diagnoses    Substance induced mood disorder (HCC) 2.     Severe alcohol use disorder [facility])   Assessment   Christian King is a 53 y.o. Caucasian male with a past psychiatric history of substance-induced mood disorder and severe alcohol use disorder, with pertinent medical comorbidities/history that include hyperlipidemia and chronic hypokalemia, who presented this encounter by way of EMS and GPD under involuntary commitment, for concerns for expressions of suicidal ideations made while severely intoxicated on EtOH, as well as for concerns for recent fall while intoxicated, who upon EDP evaluation, consulted psychiatry for specialty evaluation and recommendations.  Patient is currently medically cleared at this time, as well as remains involuntarily committed, per EDP team.  Upon evaluation, patient presents with symptomology that is most consistent with severe alcohol use disorder and substance-induced mood disorder.  Evidence of this is appreciable from investigation conducted, where it is revealed from collateral obtained from family, that the patient has been over the last 6 months drinking and abusing EtOH severely every day, as well as endorsements for family that just prior to coming in this encounter, while severely intoxicated, the patient fell and lacerated his forehead, and gave multiple expressions of suicidal  ideations.  During examination, patient is severely minimizing of the recent events that transpired, as well as refutes all collateral information reports obtained from the patient's father and wife about his drinking and problems with his mental health.  Nonetheless, documentation supports and affirms that the patient while intoxicated has made expressions of suicidal ideations, as well as collateral obtained from the patient's father and wife also give testimony that the patient has been threatening to kill himself, thus the recommendation at this time, for safety and stabilization of the patient, is for inpatient mental health hospitalization.  Discussed with patient that the recommendation would be for inpatient mental health hospitalization, to which patient endorsed that he did not agree, but calmly endorsed that he would cooperate with the IVC and inpatient mental health hospitalization process, and endorsed that he was amenable to CIWA protocols and medications.  Diagnoses:  Active Hospital problems: Principal Problem:   Substance induced mood disorder (HCC) Active Problems:   Severe alcohol use disorder (HCC)    Plan   #Substance induced mood disorder (HCC) #Severe alcohol use disorder (HCC)  ## Psychiatric Recommendations:   - Recommend CIWA protocols/medications  ## Medical Decision Making Capacity: Not specifically addressed in this encounter  ## Further Work-up: None at this time  ## Disposition:--Recommend inpatient mental health hospitalization under involuntary commitment upon medical clearance  ## Behavioral / Environmental: -CIWA protocol/medications, strict safety/agitation precautions    ## Safety and Observation Level:  - Based on my clinical evaluation, I estimate the patient to be at low risk of self harm in the current setting. -  At this time, we recommend  routine. This decision is based on my review of the chart including patient's history and current  presentation, interview of the patient, mental status examination, and consideration of suicide risk including evaluating suicidal ideation, plan, intent, suicidal or self-harm behaviors, risk factors, and protective factors. This judgment is based on our ability to directly address suicide risk, implement suicide prevention strategies, and develop a safety plan while the patient is in the clinical setting. Please contact our team if there is a concern that risk level has changed.  CSSR Risk Category:C-SSRS RISK CATEGORY: No Risk  Suicide Risk Assessment: Patient has following modifiable risk factors for suicide: access to guns, untreated depression, recklessness, medication noncompliance, lack of access to outpatient mental health resources, triggering events, and recent psychiatric hospitalization, which we are addressing by recommendations . Patient has following non-modifiable or demographic risk factors for suicide: male gender, separation or divorce, psychiatric hospitalization, and family h/o suicide Patient has the following protective factors against suicide: Access to outpatient mental health care, Supportive family, Supportive friends, Pets in the home, Frustration tolerance, no history of suicide attempts, and no history of NSSIB  Thank you for this consult request. Recommendations have been communicated to the primary team.  We will Continue to follow at this time.   Jerel JINNY Gravely, NP       History of Present Illness   Christian King is a 53 y.o. Caucasian male with a past psychiatric history of substance-induced mood disorder and severe alcohol use disorder, with pertinent medical comorbidities/history that include hyperlipidemia and chronic hypokalemia, who presented this encounter by way of EMS and GPD under involuntary commitment, for concerns for expressions of suicidal ideations made while severely intoxicated on EtOH, as well as for concerns for recent fall while  intoxicated, who upon EDP evaluation, consulted psychiatry for specialty evaluation and recommendations.  Patient is currently medically cleared at this time, as well as remains involuntarily committed, per EDP team.  Patient seen today for face-to-face psychiatric evaluation.  Upon evaluation, patient endorses that he was brought in this encounter because, I musta just got way too drunk and my wife took out papers on me.   Prompted to expand, patient endorses that he normally does not drink liquor lately, but because he has been having a terrible sinus infection with drainage over the last several days, states that he decided yesterday to go to the liquor store to pick up some mango liquor to flush my sinuses out naturally, and then states he again, must have drank too much, and honestly said some things that I shouldn't have said, because know I'm here, and I guarantee my soon to be ex-wife is at my house moving out all of my good furniture right now while she got me locked up here.   Prompted to expand further, patient endorses that recently he and his wife have begun the divorce process of separating, and states that his wife is now staying at her parents house down the street, but that they are still in communication often over their affairs (I.e., furniture, etc).  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 Patient upon interviewing presents with no signs of being currently intoxicated, and objectively does not appear to be presenting with withdrawal symptomology, and subjectively endorses no withdrawal symptomology.  Patient endorses no history of rehabilitation services for EtOH use, outside of being hospitalized inpatient at [facility] only a few short months back for similar episode and presentation (I.e., expression of suicidal ideations in the context of EtOH intoxication). Patient endorses that outside of trialing naltrexone  while admitted at [facility] a few short months ago, denies utilization of any other medications, denies utilization of outpatient mental health/substance abuse medication management and/or therapy services, and additionally forwards and shares that he is not interested in mental health or substance abuse services, stating, I think the whole thing is ridiculous, honestly, it's not for me.  Patient endorses no drug use, but endorses daily tobacco use, states that he has been using tobacco for many years.  UDS appreciably negative.  Patient endorses no history of suicide attempts and/or history of self-injurious behavior, but when discussing allegations of expressing suicidal ideations while under the influence, patient states that again, I won't deny it, I don't remember it, but I guarantee I did.   Patient orientation is intact, no concerns for fluctuations in consciousness.  Patient endorses no auditory or visual hallucinations, and objectively, does not appear to be presenting with psychotic  features.  Patient endorses no suicidal or homicidal ideations currently.  Patient endorses an euthymic mood, with a congruent affect, good eye contact, and a largely euthymic interpersonal style, but is very minimizing around EtOH use and abuse, and its consequences it has caused in the patient's life, like aforementioned.   Patient denies any problems with eating or sleeping.  Patient denies any problems with depression or anxiety.  Patient endorses no history of EtOH withdrawal complications (I.e., seizures, tremors, delirium).  Patient endorses no desire to start psychiatric pharmacological interventions, but endorses that he is amenable to CIWA protocol/interventions.  Patient endorses that he would like to discharge, but upon lengthy discussion with this provider about rationale for firmly being recommended for inpatient mental health hospitalization, patient verbalized that he understands.  Collateral, patient's father, spoke to over the phone, Mr. Tuzzolino senior   Call placed and extensive conversation held with the patient's father.  Patient's father reports that what the patient is stating about his drinking is absolutely not true, states that his son has been drinking severely every day for the last 6 months, and very frequently gets severely intoxicated, and becomes a danger to himself or others.  Patient's father reports that just prior to coming in, the patient's neighbor Chyrl went over to check on the patient, and found him severely intoxicated laying face first on the floor, appearing to have gotten up and fallen directly on his face out of his recliner that he was sleeping in, which led ultimately to EMS/GPD being  called out for concerns, and is when the patient gave multiple expressions of wanting to kill himself. Patient's father reports that he is very concerned for his son safety and would like it if the patient was hospitalized as long as he can, he needs the help.  Collateral, IVC  petitioner and patient's wife, attempted to be contacted, but no answer; per her IVC reported testimony however,  Petitioner states respondent has been consuming alcohol all day Petitioner states respondent made comments while intoxicated that he was going to commit suicide by obtaining a firearm. Petitioner states respondent has previously been IVC on 07/2023, and was evaluated by behavioral health Petitioner states respondent has made threats to commit suicide both while sober and under the influence and has multiple triggers  Review of Systems  Constitutional:  Negative for chills, malaise/fatigue and weight loss.  HENT:         Denies head pain   Eyes:  Negative for blurred vision and double vision.  Cardiovascular:  Negative for chest pain.  Gastrointestinal:  Negative for abdominal pain, diarrhea, nausea and vomiting.  Musculoskeletal:  Negative for myalgias.  Neurological:  Negative for dizziness, tremors, seizures, loss of consciousness and headaches.  Psychiatric/Behavioral:  Positive for substance abuse (ETOH). Negative for depression, hallucinations and suicidal ideas. The patient is not nervous/anxious and does not have insomnia.   All other systems reviewed and are negative.    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 before Living Situation: Lives alone, wife just recently moved out Spiritual Hx: None reported Access to weapons/lethal means: Collateral from the  patient's father reports that the patient still has his firearms in the home  Substance History Alcohol: Severe use disorder   Type of alcohol: liquor and beer  Last Drink: just prior to this encounter  Number of drinks per day: it varies  History of alcohol withdrawal seizures : none reported and denies History of DT's: none reported and denies Tobacco: Daily Illicit drugs: None reported Prescription drug abuse: None reported Rehab hx: None reported  Exam Findings  Physical Exam: As below Vital Signs:  Temp:  [98.2 F (36.8 C)] 98.2 F (36.8 C) (09/21 0013) Pulse Rate:  [57-94] 62 (09/21 1243) Resp:  [13-32] 20 (09/21 1015) BP: (91-128)/(68-93) 111/76 (09/21 1315) SpO2:  [87 %-97 %] 95 % (09/21 1330) Blood pressure 111/76, pulse 62, temperature 98.2 F (36.8 C), temperature source Oral, resp. rate 20, SpO2 95%. There is no height or weight on file to calculate BMI.  Physical Exam Vitals and nursing note reviewed.  Constitutional:      General: He is not in acute distress.    Appearance: Normal appearance. He is normal weight. He is not ill-appearing, toxic-appearing or diaphoretic.  Pulmonary:     Effort: Pulmonary effort is normal.  Skin:    General: Skin is warm and dry.  Neurological:     Mental Status: He is alert and oriented to person, place, and time.     Motor: No weakness, tremor or seizure activity.  Psychiatric:  Attention and Perception: Attention and perception normal. He does not perceive auditory or visual hallucinations.        Mood and Affect: Mood and affect normal.        Speech: Speech normal.        Behavior: Behavior is cooperative.        Thought Content: Thought content normal. Thought content is not paranoid or delusional. Thought content does not include homicidal or suicidal ideation.        Judgment: Judgment is impulsive and inappropriate.     Comments: Behavior: Cooperative, but dismissive, and very minimizing of ETOH use and abuse   Cognition and memory: Grossly intact      Mental Status Exam: General Appearance: Normal bulk and tone Caucasian male older than stated age with fair grooming and largely euthymic interpersonal style  Orientation:  Full (Time, Place, and Person)  Memory:  Grossly intact  Concentration:  Concentration: Fair and Attention Span: Fair  Recall:  Fair (outside of just prior to coming in)   Attention  Fair  Eye Contact:  Good  Speech:  Clear and Coherent and Normal Rate  Language:  Fair  Volume:  Normal  Mood: Tired  Affect:  Appropriate  Thought Process:  Coherent, Goal Directed, and Linear  Thought Content:  Logical, but severely minimizing of etoh abuse  Suicidal Thoughts:  No  Homicidal Thoughts:  No  Judgement:  Poor  Insight:  Lacking  Psychomotor Activity:  Normal  Akathisia:  No  Fund of Knowledge:  Fair      Assets:  Manufacturing systems engineer Desire for Improvement Financial Resources/Insurance Housing Leisure Time Physical Health Resilience Social Support Talents/Skills Transportation Vocational/Educational  Cognition:  WNL  ADL's:  Intact  AIMS (if indicated):   0     Other History   These have been pulled in through the EMR, reviewed, and updated if appropriate.  Family History:  The patient's family history includes Diabetes in his maternal grandmother; Hypertension in his father and paternal grandmother; Stroke in his paternal grandmother.  Medical History: Past Medical History:  Diagnosis Date   Alcohol abuse 07/24/2010   B12 deficiency    Blood dyscrasia    hemachromatosis   BMI 25.0-25.9,adult 01/28/2015   Bronchitis    Degenerative joint disease (DJD) of hip    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   Depression    Essential hypertension 04/26/2010   Qualifier: Diagnosis of   By: Gwenn Grimes         GERD (gastroesophageal reflux disease)    Gout    Gout 04/26/2010   Qualifier: Diagnosis of   By: Gwenn Grimes          Hemochromatosis 10/03/2013   History of DVT (deep vein thrombosis) 05/15/2016   History of kidney stones    HLD (hyperlipidemia)    HTN (hypertension)    Hyperlipidemia 04/26/2010   Qualifier: Diagnosis of   By: Gwenn Grimes         Hypokalemia 03/17/2016   Hypothyroidism    Insomnia 02/05/2014   QT prolongation 03/20/2015   Severe alcohol use disorder (HCC) 07/21/2023   Ventricular tachycardia (HCC)    in setting of hypokalemia/ hypomagnesemia and ETOH   Vitamin B12 deficiency 04/26/2010   Qualifier: Diagnosis of   By: Gwenn Grimes         Wears glasses     Surgical History: Past Surgical History:  Procedure Laterality Date   TOTAL HIP ARTHROPLASTY Right    TOTAL  HIP ARTHROPLASTY Left 03/16/2016   Procedure: TOTAL HIP ARTHROPLASTY;  Surgeon: Shari Sieving, MD;  Location: [facility] OR;  Service: Orthopedics;  Laterality: Left;   vascularized fibular graft     right hip for avascular necrosis     Medications:   Current Facility-Administered Medications:    acetaminophen  (TYLENOL ) tablet 650 mg, 650 mg, Oral, Q4H PRN, Raford Lenis, MD   alum & mag hydroxide-simeth (MAALOX/MYLANTA) 200-200-20 MG/5ML suspension 30 mL, 30 mL, Oral, Q6H PRN, Raford Lenis, MD   LORazepam  (ATIVAN ) injection 0-4 mg, 0-4 mg, Intravenous, Q6H **OR** LORazepam  (ATIVAN ) tablet 0-4 mg, 0-4 mg, Oral, Q6H, Haviland, Julie, MD   NOREEN ON 11/12/2023] LORazepam  (ATIVAN ) injection 0-4 mg, 0-4 mg, Intravenous, Q12H **OR** [START ON 11/12/2023] LORazepam  (ATIVAN ) tablet 0-4 mg, 0-4 mg, Oral, Q12H, Haviland, Julie, MD   metoprolol  succinate (TOPROL -XL) 24 hr tablet 50 mg, 50 mg, Oral, Daily, Raford Lenis, MD   naltrexone  (DEPADE) tablet 25 mg, 25 mg, Oral, Daily, Raford Lenis, MD, 25 mg at 11/10/23 1005   nicotine  (NICODERM CQ  - dosed in mg/24 hr) patch 7 mg, 7 mg, Transdermal, Daily, Raford Lenis, MD   ondansetron  (ZOFRAN ) tablet 4 mg, 4 mg, Oral, Q8H PRN, Raford Lenis, MD   thiamine  (VITAMIN B1) tablet 100 mg,  100 mg, Oral, Daily, 100 mg at 11/10/23 0924 **OR** thiamine  (VITAMIN B1) injection 100 mg, 100 mg, Intravenous, Daily, Haviland, Julie, MD  Current Outpatient Medications:    fenofibrate  (TRICOR ) 48 MG tablet, Take 48 mg by mouth daily., Disp: , Rfl:    ibuprofen  (ADVIL ) 200 MG tablet, Take 200-400 mg by mouth in the morning and at bedtime. 200 mg am, 400 mg pm, Disp: , Rfl:    magnesium  oxide (MAG-OX) 400 (240 Mg) MG tablet, Take 400 mg by mouth daily., Disp: , Rfl:    metoprolol  succinate (TOPROL -XL) 50 MG 24 hr tablet, Take 1 tablet (50 mg total) by mouth daily with or immediately following a meal., Disp: 90 tablet, Rfl: 1   potassium chloride  (KLOR-CON ) 10 MEQ tablet, Take 10 mEq by mouth daily., Disp: , Rfl:    simvastatin  (ZOCOR ) 40 MG tablet, Take 40 mg by mouth daily., Disp: , Rfl:    thiamine  (VITAMIN B1) 100 MG tablet, Take 100 mg by mouth daily., Disp: , Rfl:   Allergies: Allergies  Allergen Reactions   Amoxicillin  Hives, Itching and Rash    Turned body completely red, severe itching all over body   Azithromycin  Other (See Comments)    History of QT prolongation   Hctz [Hydrochlorothiazide] Rash   Tramadol Itching    Jerel JINNY Gravely, NP

## 2023-11-10 NOTE — ED Notes (Signed)
 Pt stated he was drunk when he said I want to kill myself pt stated he would never hurt himself and his ex-wife had him committed so she could take the furniture out of his house.

## 2023-11-11 ENCOUNTER — Encounter: Payer: Self-pay | Admitting: Psychiatry

## 2023-11-11 MED ORDER — CHLORDIAZEPOXIDE HCL 25 MG PO CAPS
25.0000 mg | ORAL_CAPSULE | Freq: Four times a day (QID) | ORAL | Status: AC
  Administered 2023-11-11 – 2023-11-12 (×4): 25 mg via ORAL
  Filled 2023-11-11 (×4): qty 1

## 2023-11-11 MED ORDER — CHLORDIAZEPOXIDE HCL 25 MG PO CAPS: 25.0000 mg | ORAL_CAPSULE | Freq: Every day | ORAL | Status: DC

## 2023-11-11 MED ORDER — CHLORDIAZEPOXIDE HCL 25 MG PO CAPS: 25.0000 mg | ORAL_CAPSULE | ORAL | Status: DC

## 2023-11-11 MED ORDER — CHLORDIAZEPOXIDE HCL 25 MG PO CAPS
25.0000 mg | ORAL_CAPSULE | Freq: Three times a day (TID) | ORAL | Status: AC
  Administered 2023-11-12 – 2023-11-13 (×3): 25 mg via ORAL
  Filled 2023-11-11 (×3): qty 1

## 2023-11-11 MED ORDER — CHLORDIAZEPOXIDE HCL 25 MG PO CAPS: 25.0000 mg | ORAL_CAPSULE | Freq: Four times a day (QID) | ORAL | Status: AC | PRN

## 2023-11-11 NOTE — Group Note (Signed)
 Recreation Therapy Group Note   Group Topic:Health and Wellness  Group Date: 11/11/2023 Start Time: 1100 End Time: 1135 Facilitators: Celestia Jeoffrey BRAVO, LRT, CTRS Location: Courtyard  Group Description: [employer]. LRT and patients played games of basketball, drew with chalk, and played corn hole while outside in the courtyard while getting fresh air and sunlight. Music was being played in the background. LRT and peers conversed about different games they have played before, what they do in their free time and anything else that is on their minds. LRT encouraged pts to drink water after being outside, sweating and getting their heart rate up.  Goal Area(s) Addressed: Patient will build on frustration tolerance skills. Patients will partake in a competitive play game with peers. Patients will gain knowledge of new leisure interest/hobby.    Affect/Mood: N/A   Participation Level: Did not attend    Clinical Observations/Individualized Feedback: Patient did not attend group.   Plan: Continue to engage patient in RT group sessions 2-3x/week.   Jeoffrey BRAVO Celestia, LRT, CTRS 11/11/2023 11:59 AM

## 2023-11-11 NOTE — Plan of Care (Signed)

## 2023-11-11 NOTE — Progress Notes (Signed)
   11/11/23 0903  Psych Admission Type (Psych Patients Only)  Admission Status Involuntary  Psychosocial Assessment  Patient Complaints Anxiety;Substance abuse  Eye Contact Fair  Facial Expression Flat  Affect Flat  Speech Rapid  Interaction Assertive  Motor Activity Tremors  Appearance/Hygiene Disheveled  Behavior Characteristics Cooperative  Mood Anxious  Aggressive Behavior  Effect No apparent injury  Thought Process  Coherency WDL  Content WDL  Delusions None reported or observed  Perception WDL  Hallucination None reported or observed  Judgment Poor  Confusion WDL  Danger to Self  Current suicidal ideation? Denies  Danger to Others  Danger to Others None reported or observed

## 2023-11-11 NOTE — Plan of Care (Signed)

## 2023-11-11 NOTE — Group Note (Signed)
 Date:  11/11/2023 Time:  9:02 PM  Group Topic/Focus:  Wrap-Up Group:   The focus of this group is to help patients review their daily goal of treatment and discuss progress on daily workbooks.    Participation Level:  Active  Participation Quality:  Appropriate  Affect:  Appropriate  Cognitive:  Appropriate  Insight: Appropriate  Engagement in Group:  Engaged  Modes of Intervention:  Discussion  Additional Comments:    Christian King 11/11/2023, 9:02 PM

## 2023-11-11 NOTE — [facility] Suicide Risk Assessment (Signed)
 [facility] Admission Suicide Risk Assessment   Nursing information obtained from:  Patient Demographic factors:  Male Current Mental Status:  Self-harm behaviors Loss Factors:  Financial problems / change in socioeconomic status, Decline in physical health Historical Factors:  NA Risk Reduction Factors:  Positive coping skills or problem solving skills  Total Time spent with patient: 30 minutes Principal Problem: Substance induced mood disorder (HCC) Diagnosis:  Principal Problem:   Substance induced mood disorder (HCC)  Subjective Data: This is a 53 year old male with a history of alcohol use disorder.  Presenting issues included suicidal ideation in the context of alcohol intoxication.  Current stressors include recent separation with wife.  Patient denies past psychiatric history or previous psychiatric medications.  Patient is admitted to the adult inpatient unit with every 15-minute safety monitoring.  Multidisciplinary team approach is offered.  Medication management, group/milieu therapy is offered.  Continued Clinical Symptoms:  Alcohol Use Disorder Identification Test Final Score (AUDIT): 23 The Alcohol Use Disorders Identification Test, Guidelines for Use in Primary Care, Second Edition.  World Science writer [facility]). Score between 0-7:  no or low risk or alcohol related problems. Score between 8-15:  moderate risk of alcohol related problems. Score between 16-19:  high risk of alcohol related problems. Score 20 or above:  warrants further diagnostic evaluation for alcohol dependence and treatment.   CLINICAL FACTORS:   Depression:   Impulsivity   Musculoskeletal: Strength & Muscle Tone: within normal limits Gait & Station: normal Patient leans: N/A  Psychiatric Specialty Exam:  Presentation  General Appearance:  Casual  Eye Contact: Fair  Speech: Clear and Coherent  Speech Volume: Normal  Handedness: Right   Mood and Affect   Mood: Euthymic  Affect: Appropriate   Thought Process  Thought Processes: Coherent  Descriptions of Associations:Intact  Orientation:Full (Time, Place and Person)  Thought Content:WDL  History of Schizophrenia/Schizoaffective disorder:No  Duration of Psychotic Symptoms:No data recorded Hallucinations:Hallucinations: None  Ideas of Reference:None  Suicidal Thoughts:Suicidal Thoughts: No  Homicidal Thoughts:Homicidal Thoughts: No   Sensorium  Memory: Immediate Fair; Recent Poor; Remote Poor  Judgment: Poor  Insight: Poor   Executive Functions  Concentration: Poor  Attention Span: Poor  Recall: Fair  Fund of Knowledge: Fair  Language: Fair   Psychomotor Activity  Psychomotor Activity: Psychomotor Activity: Tremor   Assets  Assets: Communication Skills   Sleep  Sleep: Sleep: Poor    Physical Exam: Physical Exam ROS Blood pressure 128/76, pulse 79, temperature 97.9 F (36.6 C), temperature source Oral, resp. rate 20, height 5' 9 (1.753 m), weight 68.9 kg, SpO2 100%. Body mass index is 22.45 kg/m.   COGNITIVE FEATURES THAT CONTRIBUTE TO RISK:  None    SUICIDE RISK:   Severe:  Frequent, intense, and enduring suicidal ideation, specific plan, no subjective intent, but some objective markers of intent (i.e., choice of lethal method), the method is accessible, some limited preparatory behavior, evidence of impaired self-control, severe dysphoria/symptomatology, multiple risk factors present, and few if any protective factors, particularly a lack of social support.  PLAN OF CARE: Patient is admitted to the adult inpatient unit with every 15-minute safety monitoring.  Multidisciplinary team approach is offered.  Medication management, group/milieu therapy is offered.  I certify that inpatient services furnished can reasonably be expected to improve the patient's condition.   Camelia LITTIE Lukes, PA-C 11/11/2023, 4:55 PM

## 2023-11-11 NOTE — Group Note (Signed)
 Date:  11/11/2023 Time:  3:39 PM  Group Topic/Focus:  Goals Group:   The focus of this group is to help patients establish daily goals to achieve during treatment and discuss how the patient can incorporate goal setting into their daily lives to aide in recovery.    Participation Level:  Did Not Attend  Dannie CHRISTELLA Hover 11/11/2023, 3:39 PM

## 2023-11-11 NOTE — BH Assessment (Signed)
 Admission Note:  52 yr male who presents IVC in no acute distress for the treatment of SI and Etoh detox. Patient denies SI,HI and AVH. Patient is on ciwa. Patient received 1mg  ativan  on arrival Patient recently fell at home due to drinking half a gallon of vodka.Patient has a laceration to top right scalp. Patient lives alone. Wife recently moved out. PT searched and no contraband found, POC and unit policies explained and understanding verbalized. Consents obtained. Food and fluids offered, and fluids accepted. Pt had no additional questions or concerns.

## 2023-11-11 NOTE — H&P (Signed)
 Psychiatric Admission Assessment Adult  Patient Identification: Christian King MRN:  [id number] Date of Evaluation:  11/11/2023 Chief Complaint:  Substance induced mood disorder (HCC) [F19.94]   History of Present Illness:  Christian King is a 53 y.o. Caucasian male with a past psychiatric history of substance-induced mood disorder and severe alcohol use disorder, with pertinent medical comorbidities/history that include hyperlipidemia and chronic hypokalemia, who presented this encounter by way of EMS and GPD under involuntary commitment, for concerns for expressions of suicidal ideations made while severely intoxicated on EtOH, as well as for concerns for recent fall while intoxicated. Patient was medically cleared by EDP prior to inpatient psychiatric hospitalization. He remains involuntarily committed.   On interview today patient denies symptoms of depression or anxiety.  He denies manic symptoms. He denies current SI/HI/plan and denies hallucinations.  He states he did not sleep well last night but that his appetite is good.  Patient endorses being told he made suicidal comments while intoxicated but states he does not remember doing so.  He states he does not know why he would say these things, stating that he does not currently feel suicidal.  He admits to making statements like this previously, also while intoxicated.  Current stressors include patient recently separated from his wife, who has moved out of their home.  Per patient, she has retained a lawyer and he is expecting to be served to divorce papers.  Patient states he currently lives alone.  He has 1 adult son and 1 adult stepdaughter.  He states he does not have access to guns other than BB guns, though this contradicts collateral from father obtained from consulted psychiatric provider while patient was in the ED.   Patient denies nausea, vomiting, diaphoresis, anxiety, or agitation.  He endorses tremor that has been present  since age 31 which he attributes to nerves.  He states tremor has previously been evaluated by his primary care provider.   Patient appears to minimize his history of EtOH use disorder per chart review.  He was previously hospitalized inpatient at [facility] a few months ago for a similar episode and presentation involving expression of suicidal ideations in the context of EtOH intoxication.  He previously trialed naltrexone  while admitted at [facility], denies utilization of any other medications, denies utilization of outpatient mental health/substance abuse medication management or therapy services. 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 before Living Situation: Lives alone, wife recently moved out Spiritual Hx: None reported Access to weapons/lethal means: Per chart review, collateral from the patient's father reports that the patient still has his firearms in the home   Substance History Alcohol: Severe use disorder   Type of alcohol: liquor and beer  Last Drink: one day ago prior to ED presentation Number of drinks per day: it varies  History of alcohol withdrawal seizures : none reported and denies History of DT's: none reported and denies Tobacco: Daily Illicit drugs: None reported Prescription drug abuse: None reported Rehab hx: None reported Is the patient at risk to  self? Yes.    Has the patient been a risk to self in the past 6 months? Yes.    Has the patient been a risk to self within  the distant past? Unknown  Is the patient a risk to others? No.  Has the patient been a risk to others in the past 6 months? No.  Has the patient been a risk to others within the distant past? No.   [place] Scale:  Flowsheet Row Admission (Current) from 11/10/2023 in [facility]AL MEDICINE ED from 11/09/2023 in [facility] Emergency Department [facility] (Discharged[facility]  C-SSRS RISK CATEGORY No Risk No Risk No Risk     Past Medical History:  Past Medical History:  Diagnosis Date   Alcohol abuse 07/24/2010   B12 deficiency    Blood dyscrasia    hemachromatosis   BMI 25.0-25.9,adult 01/28/2015   Bronchitis    Degenerative joint disease (DJD) of hip    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   Depression    Essential hypertension 04/26/2010   Qualifier: Diagnosis of   By: Gwenn Grimes         GERD (gastroesophageal reflux disease)    Gout    Gout 04/26/2010   Qualifier: Diagnosis of   By: Gwenn Grimes         Hemochromatosis 10/03/2013   History of DVT (deep vein thrombosis) 05/15/2016   History of kidney stones    HLD (hyperlipidemia)    HTN (hypertension)    Hyperlipidemia 04/26/2010   Qualifier: Diagnosis of   By: Gwenn Grimes         Hypokalemia 03/17/2016   Hypothyroidism    Insomnia 02/05/2014   QT prolongation 03/20/2015   Severe alcohol use disorder (HCC) 07/21/2023   Ventricular tachycardia (HCC)    in setting of hypokalemia/ hypomagnesemia and ETOH   Vitamin B12 deficiency 04/26/2010   Qualifier: Diagnosis of   By: Gwenn Grimes         Wears glasses     Past Surgical History:  Procedure Laterality Date   TOTAL HIP ARTHROPLASTY Right    TOTAL HIP ARTHROPLASTY Left 03/16/2016   Procedure: TOTAL HIP ARTHROPLASTY;  Surgeon: Shari Sieving, MD;  Location: [facility] OR;  Service: Orthopedics;  Laterality: Left;   vascularized fibular graft     right  hip for avascular necrosis   Family History:  Family History  Problem Relation Age of Onset   Hypertension Father    Diabetes Maternal Grandmother    Hypertension Paternal Grandmother    Stroke Paternal Grandmother     Social History:  Social History   Substance and Sexual Activity  Alcohol Use Yes   Alcohol/week: 12.0 standard drinks of alcohol   Types: 12 Cans of beer per week   Comment: daily     Social History   Substance and Sexual Activity  Drug Use No      Allergies:   Allergies  Allergen Reactions   Amoxicillin  Hives, Itching and Rash    Turned body completely red, severe itching all over body   Azithromycin  Other (See Comments)    History of QT prolongation   Hctz [Hydrochlorothiazide] Rash   Tramadol Itching   Lab Results:  Results for orders placed or performed during the hospital encounter of 11/09/23 (from the past 48 hours)  Comprehensive metabolic panel     Status: Abnormal   Collection Time: 11/10/23 12:38 AM  Result  Value Ref Range   Sodium 143 135 - 145 mmol/L   Potassium 3.6 3.5 - 5.1 mmol/L   Chloride 103 98 - 111 mmol/L   CO2 24 22 - 32 mmol/L   Glucose, Bld 99 70 - 99 mg/dL    Comment: Glucose reference range applies only to samples taken after fasting for at least 8 hours.   BUN 8 6 - 20 mg/dL   Creatinine, Ser 9.24 0.61 - 1.24 mg/dL   Calcium  8.4 (L) 8.9 - 10.3 mg/dL   Total Protein 6.6 6.5 - 8.1 g/dL   Albumin 3.4 (L) 3.5 - 5.0 g/dL   AST 69 (H) 15 - 41 U/L   ALT 26 0 - 44 U/L   Alkaline Phosphatase 51 38 - 126 U/L   Total Bilirubin 0.6 0.0 - 1.2 mg/dL   GFR, Estimated >39 >39 mL/min    Comment: (NOTE) Calculated using the CKD-EPI Creatinine Equation (2021)    Anion gap 16 (H) 5 - 15    Comment: Performed at [facility], [address] [place], [place] [address]  Ethanol     Status: Abnormal   Collection Time: 11/10/23 12:38 AM  Result Value Ref Range   Alcohol, Ethyl (B) >450 (HH) <15 mg/dL    Comment: CRITICAL RESULT  CALLED TO, READ BACK BY AND VERIFIED WITH PRUITT,G ON 11/10/23 AT 0101 BY PURDIE,J (NOTE) For medical purposes only. Performed at [facility], [address] [place], [place] [address]   CBC with Differential     Status: Abnormal   Collection Time: 11/10/23 12:38 AM  Result Value Ref Range   WBC 2.9 (L) 4.0 - 10.5 K/uL   RBC 3.83 (L) 4.22 - 5.81 MIL/uL   Hemoglobin 13.8 13.0 - 17.0 g/dL   HCT 60.7 60.9 - 47.9 %   MCV 102.3 (H) 80.0 - 100.0 fL   MCH 36.0 (H) 26.0 - 34.0 pg   MCHC 35.2 30.0 - 36.0 g/dL   RDW 85.5 88.4 - 84.4 %   Platelets 66 (L) 150 - 400 K/uL    Comment: SPECIMEN CHECKED FOR CLOTS REPEATED TO VERIFY Immature Platelet Fraction may be clinically indicated, consider ordering this additional test [id number]    nRBC 0.0 0.0 - 0.2 %   Neutrophils Relative % 60 %   Neutro Abs 1.7 1.7 - 7.7 K/uL   Lymphocytes Relative 26 %   Lymphs Abs 0.8 0.7 - 4.0 K/uL   Monocytes Relative 13 %   Monocytes Absolute 0.4 0.1 - 1.0 K/uL   Eosinophils Relative 0 %   Eosinophils Absolute 0.0 0.0 - 0.5 K/uL   Basophils Relative 1 %   Basophils Absolute 0.0 0.0 - 0.1 K/uL   Immature Granulocytes 0 %   Abs Immature Granulocytes 0.00 0.00 - 0.07 K/uL    Comment: Performed at [facility], [address], [place], [place] [address]  Magnesium      Status: Abnormal   Collection Time: 11/10/23 12:38 AM  Result Value Ref Range   Magnesium  1.5 (L) 1.7 - 2.4 mg/dL    Comment: Performed at [facility], [address] [place], [place] [address]  Urine rapid drug screen (hosp performed)     Status: None   Collection Time: 11/10/23 12:41 AM  Result Value Ref Range   Opiates NONE DETECTED NONE DETECTED   Cocaine NONE DETECTED NONE DETECTED   Benzodiazepines NONE DETECTED NONE DETECTED   Amphetamines NONE DETECTED NONE DETECTED   Tetrahydrocannabinol NONE DETECTED NONE DETECTED   Barbiturates  NONE DETECTED NONE DETECTED    Comment: (NOTE) DRUG SCREEN FOR MEDICAL PURPOSES ONLY.  IF CONFIRMATION  IS NEEDED FOR ANY PURPOSE, NOTIFY LAB WITHIN 5 DAYS.  LOWEST DETECTABLE LIMITS FOR URINE DRUG SCREEN Drug Class                     Cutoff (ng/mL) Amphetamine and metabolites    1000 Barbiturate and metabolites    200 Benzodiazepine                 200 Opiates and metabolites        300 Cocaine and metabolites        300 THC                            50 Performed at [facility], [address] [place], [place] [address]   Ethanol     Status: Abnormal   Collection Time: 11/10/23  6:17 AM  Result Value Ref Range   Alcohol, Ethyl (B) 381 (HH) <15 mg/dL    Comment: CRITICAL RESULT CALLED TO, READ BACK BY AND VERIFIED WITH E. PREYER ON 11/10/2023 @6 :47AM BY T. HAMER (NOTE) For medical purposes only. Performed at [facility], [address], [place], [place] [address]     Blood Alcohol level:  Lab Results  Component Value Date   ETH 381 [facility]) 11/10/2023   ETH >450 (HH) 11/10/2023    Metabolic Disorder Labs:  Lab Results  Component Value Date   HGBA1C 5.3 07/21/2023   MPG 105.41 07/21/2023   No results found for: PROLACTIN Lab Results  Component Value Date   CHOL 215 (H) 07/21/2023   TRIG 87 07/21/2023   HDL >135 07/21/2023   CHOLHDL NOT CALCULATED 07/21/2023   VLDL 17 07/21/2023   LDLCALC NOT CALCULATED 07/21/2023   LDLCALC 65 04/27/2022    Current Medications: Current Facility-Administered Medications  Medication Dose Route Frequency Provider Last Rate Last Admin   acetaminophen  (TYLENOL ) tablet 650 mg  650 mg Oral Q6H PRN Mannie Jerel PARAS, NP   650 mg at 11/11/23 0115   alum & mag hydroxide-simeth (MAALOX/MYLANTA) 200-200-20 MG/5ML suspension 30 mL  30 mL Oral Q4H PRN Mannie Jerel PARAS, NP       chlordiazePOXIDE  (LIBRIUM ) capsule 25 mg  25 mg Oral Q6H PRN Millington, Matthew E, PA-C       chlordiazePOXIDE  (LIBRIUM ) capsule 25 mg  25 mg Oral QID Millington, Matthew E, PA-C       Followed by   NOREEN ON 11/12/2023] chlordiazePOXIDE  (LIBRIUM ) capsule 25 mg  25 mg  Oral TID Millington, Matthew E, PA-C       Followed by   NOREEN ON 11/13/2023] chlordiazePOXIDE  (LIBRIUM ) capsule 25 mg  25 mg Oral BH-qamhs Millington, Donnice BRAVO, PA-C       Followed by   NOREEN ON 11/15/2023] chlordiazePOXIDE  (LIBRIUM ) capsule 25 mg  25 mg Oral Daily Millington, Matthew E, PA-C       haloperidol  (HALDOL ) tablet 5 mg  5 mg Oral TID PRN Mannie Jerel PARAS, NP       And   diphenhydrAMINE  (BENADRYL ) capsule 50 mg  50 mg Oral TID PRN Mannie Jerel PARAS, NP       haloperidol  lactate (HALDOL ) injection 5 mg  5 mg Intramuscular TID PRN Mannie Jerel PARAS, NP       And   diphenhydrAMINE  (BENADRYL ) injection 50 mg  50 mg Intramuscular TID PRN Mannie Jerel  J, NP       And   LORazepam  (ATIVAN ) injection 2 mg  2 mg Intramuscular TID PRN Mannie Jerel PARAS, NP       haloperidol  lactate (HALDOL ) injection 10 mg  10 mg Intramuscular TID PRN Mannie Jerel PARAS, NP       And   diphenhydrAMINE  (BENADRYL ) injection 50 mg  50 mg Intramuscular TID PRN Mannie Jerel PARAS, NP       And   LORazepam  (ATIVAN ) injection 2 mg  2 mg Intramuscular TID PRN Mannie Jerel PARAS, NP       hydrOXYzine  (ATARAX ) tablet 25 mg  25 mg Oral Q6H PRN Mannie Jerel PARAS, NP       loperamide  (IMODIUM ) capsule 2-4 mg  2-4 mg Oral PRN Mannie Jerel PARAS, NP       magnesium  hydroxide (MILK OF MAGNESIA) suspension 30 mL  30 mL Oral Daily PRN Mannie Jerel PARAS, NP       multivitamin with minerals tablet 1 tablet  1 tablet Oral Daily Mannie Jerel PARAS, NP   1 tablet at 11/11/23 0802   nicotine  (NICODERM CQ  - dosed in mg/24 hours) patch 21 mg  21 mg Transdermal Q0600 Mannie Jerel PARAS, NP       ondansetron  (ZOFRAN -ODT) disintegrating tablet 4 mg  4 mg Oral Q6H PRN Mannie Jerel PARAS, NP       thiamine  (VITAMIN B1) tablet 100 mg  100 mg Oral Daily Mannie Jerel PARAS, NP       PTA Medications: Medications Prior to Admission  Medication Sig Dispense Refill Last Dose/Taking   fenofibrate  (TRICOR ) 48 MG tablet Take 48 mg by mouth daily.       ibuprofen  (ADVIL ) 200 MG tablet Take 200-400 mg by mouth in the morning and at bedtime. 200 mg am, 400 mg pm      magnesium  oxide (MAG-OX) 400 (240 Mg) MG tablet Take 400 mg by mouth daily.      metoprolol  succinate (TOPROL -XL) 50 MG 24 hr tablet Take 1 tablet (50 mg total) by mouth daily with or immediately following a meal. 90 tablet 1    potassium chloride  (KLOR-CON ) 10 MEQ tablet Take 10 mEq by mouth daily.      simvastatin  (ZOCOR ) 40 MG tablet Take 40 mg by mouth daily.      thiamine  (VITAMIN B1) 100 MG tablet Take 100 mg by mouth daily.       Psychiatric Specialty Exam:  Presentation  General Appearance:  Casual  Eye Contact: Fair  Speech: Clear and Coherent  Speech Volume: Normal    Mood and Affect  Mood: Euthymic  Affect: Appropriate   Thought Process  Thought Processes: Coherent  Descriptions of Associations:Intact  Orientation:Full (Time, Place and Person)  Thought Content:WDL  Hallucinations:Hallucinations: None  Ideas of Reference:None  Suicidal Thoughts: Yes Homicidal Thoughts:Homicidal Thoughts: No   Sensorium  Memory: Immediate Fair; Recent Poor; Remote Poor  Judgment: Poor  Insight: Poor   Executive Functions  Concentration: Poor  Attention Span: Poor  Recall: [employer] of Knowledge: Fair  Language: Fair   Psychomotor Activity  Psychomotor Activity: Psychomotor Activity: Tremor   Assets  Assets: Communication Skills    Musculoskeletal: Strength & Muscle Tone: within normal limits Gait & Station: normal  Physical Exam: Physical Exam HENT:     Head:     Comments: Staples in place on scalp    Nose: Nose normal.     Mouth/Throat:     Mouth: Mucous membranes are  moist.  Eyes:     Conjunctiva/sclera: Conjunctivae normal.  Pulmonary:     Effort: Pulmonary effort is normal.  Neurological:     Mental Status: He is alert and oriented to person, place, and time.  Psychiatric:        Attention and  Perception: He does not perceive auditory or visual hallucinations.        Mood and Affect: Mood is not anxious or depressed. Affect is not labile or angry.        Speech: Speech normal.        Behavior: Behavior is not agitated, aggressive, hyperactive or combative. Behavior is cooperative.        Cognition and Memory: Cognition normal.        Judgment: Judgment normal.    Review of Systems  Psychiatric/Behavioral:  Positive for substance abuse and suicidal ideas. Negative for depression and hallucinations. The patient is not nervous/anxious.   All other systems reviewed and are negative.  Blood pressure 128/76, pulse 79, temperature 97.9 F (36.6 C), temperature source Oral, resp. rate 20, height 5' 9 (1.753 m), weight 68.9 kg, SpO2 100%. Body mass index is 22.45 kg/m.  Principal Diagnosis: Substance induced mood disorder (HCC) Diagnosis:  Principal Problem:   Substance induced mood disorder [facility])   Clinical Decision Making: Patient appropriate for inpatient psychiatric hospitalization due to multiple statements of suicidal ideation while intoxicated in the context of acute alcohol use disorder.  Treatment Plan Summary:  Safety and Monitoring:             -- Voluntary admission to inpatient psychiatric unit for safety, stabilization and treatment             -- Daily contact with patient to assess and evaluate symptoms and progress in treatment             -- Patient's case to be discussed in multi-disciplinary team meeting             -- Observation Level: q15 minute checks             -- Vital signs:  q12 hours             -- Precautions: suicide, elopement, and assault   2. Psychiatric Diagnoses and Treatment:              Alcohol use disorder      Librium  taper    CIWA protocol Discussed with patient adding gabapentin  which patient declines at this time, stating he believes he took it previously and did not have a good response to it but was unable to recall  specifics. Patient declines psychotropic medication at this time.  He does not meet criteria for nonemergent forced medication at this time.   -- The risks/benefits/side-effects/alternatives to this medication were discussed in detail with the patient and time was given for questions. The patient consents to medication trial.                -- Metabolic profile and EKG monitoring obtained while on an atypical antipsychotic (BMI: Lipid Panel: HbgA1c: QTc:)              -- Encouraged patient to participate in unit milieu and in scheduled group therapies                            3. Medical Issues Being Addressed:  Will continue to monitor for hypokalemia and hypertension.  Pharmacy medication  reconciliation pending.   4. Discharge Planning:              -- Social work and case management to assist with discharge planning and identification of hospital follow-up needs prior to discharge             -- Estimated LOS: 5-7 days             -- Discharge Concerns: Need to establish a safety plan; Medication compliance and effectiveness             -- Discharge Goals: Return home with outpatient referrals follow ups  Physician Treatment Plan for Primary Diagnosis: Substance induced mood disorder (HCC) Long Term Goal(s): Improvement in symptoms so as ready for discharge  Short Term Goals: Ability to identify changes in lifestyle to reduce recurrence of condition will improve, Ability to disclose and discuss suicidal ideas, Ability to demonstrate self-control will improve, and Ability to identify and develop effective coping behaviors will improve  Physician Treatment Plan for Secondary Diagnosis: Principal Problem:   Substance induced mood disorder (HCC)  Long Term Goal(s): Improvement in symptoms so as ready for discharge  Short Term Goals: Ability to identify changes in lifestyle to reduce recurrence of condition will improve, Ability to disclose and discuss suicidal ideas, Ability to  demonstrate self-control will improve, and Ability to identify and develop effective coping behaviors will improve  I certify that inpatient services furnished can reasonably be expected to improve the patient's condition.    Camelia LITTIE Lukes, PA-C 9/22/20255:21 PM

## 2023-11-11 NOTE — Group Note (Signed)
 LCSW Group Therapy Note  Group Date: 11/11/2023 Start Time: 1300 End Time: 1400   Type of Therapy and Topic:  Group Therapy - How To Cope with Nervousness about Discharge   Participation Level:  Did Not Attend   Description of Group This process group involved identification of patients' feelings about discharge. Some of them are scheduled to be discharged soon, while others are new admissions, but each of them was asked to share thoughts and feelings surrounding discharge from the hospital. One common theme was that they are excited at the prospect of going home, while another was that many of them are apprehensive about sharing why they were hospitalized. Patients were given the opportunity to discuss these feelings with their peers in preparation for discharge.  Therapeutic Goals  Patient will identify their overall feelings about pending discharge. Patient will think about how they might proactively address issues that they believe will once again arise once they get home (i.e. with parents). Patients will participate in discussion about having hope for change.   Summary of Patient Progress:   X  Therapeutic Modalities Cognitive Behavioral Therapy   DAIMIAN SUDBERRY, LCSW 11/11/2023  3:20 PM
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
## 2023-11-11 NOTE — Tx Team (Signed)
 Initial Treatment Plan 11/11/2023 6:03 AM Christian King FMW:[id number]    PATIENT STRESSORS: Health problems   Substance abuse   Traumatic event     PATIENT STRENGTHS: Ability for insight  Motivation for treatment/growth    PATIENT IDENTIFIED PROBLEMS: Suicidal Ideations                     DISCHARGE CRITERIA:  Ability to meet basic life and health needs  PRELIMINARY DISCHARGE PLAN: Attend aftercare/continuing care group  PATIENT/FAMILY INVOLVEMENT: This treatment plan has been presented to and reviewed with the patient, Christian King. The patient and family have been given the opportunity to ask questions and make suggestions.  Tonna DELENA Home, RN 11/11/2023, 6:03 AM

## 2023-11-11 NOTE — Group Note (Signed)
 Recreation Therapy Group Note   Group Topic:Other  Group Date: 11/11/2023 Start Time: 1455 End Time: 1540 Facilitators: Celestia Jeoffrey FORBES ARTICE, CTRS Location: Craft Room  Activity Description/Intervention: Therapeutic Drumming. Patients with peers and staff were given the opportunity to engage in a leader facilitated HealthRHYTHMS Group Empowerment Drumming Circle with staff from the [employer], in partnership with [employer]. Teaching laboratory technician and trained [employer], Norleen Mon leading with LRT observing and documenting intervention and pt response. This evidenced-based practice targets 7 areas of health and wellbeing in the human experience including: stress-reduction, exercise, self-expression, camaraderie/support, nurturing, spirituality, and music-making (leisure).    Goal Area(s) Addresses:  Patient will engage in pro-social way in music group.  Patient will follow directions of drum leader on the first prompt. Patient will demonstrate no behavioral issues during group.  Patient will identify if a reduction in stress level occurs as a result of participation in therapeutic drum circle.     Affect/Mood: Appropriate   Participation Level: Active and Engaged   Participation Quality: Independent   Behavior: Appropriate, Calm, and Cooperative   Speech/Thought Process: Coherent   Insight: Good   Judgement: Good   Modes of Intervention: Music   Patient Response to Interventions:  Attentive, Engaged, Interested , and Receptive   Education Outcome:  Acknowledges education   Clinical Observations/Individualized Feedback: Christian King was active in their participation of session activities and group discussion.    Plan: Continue to engage patient in RT group sessions 2-3x/week.   Jeoffrey FORBES Celestia, LRT, CTRS 11/11/2023 4:58 PM

## 2023-11-11 NOTE — BH IP Treatment Plan (Signed)
 Interdisciplinary Treatment and Diagnostic Plan Update  11/11/2023 Time of Session: 10:42 Christian King MRN: [id number]  Principal Diagnosis: Substance induced mood disorder (HCC)  Secondary Diagnoses: Principal Problem:   Substance induced mood disorder (HCC)   Current Medications:  Current Facility-Administered Medications  Medication Dose Route Frequency Provider Last Rate Last Admin   acetaminophen  (TYLENOL ) tablet 650 mg  650 mg Oral Q6H PRN Mannie Jerel PARAS, NP   650 mg at 11/11/23 0115   alum & mag hydroxide-simeth (MAALOX/MYLANTA) 200-200-20 MG/5ML suspension 30 mL  30 mL Oral Q4H PRN Mannie Jerel PARAS, NP       haloperidol  (HALDOL ) tablet 5 mg  5 mg Oral TID PRN Mannie Jerel PARAS, NP       And   diphenhydrAMINE  (BENADRYL ) capsule 50 mg  50 mg Oral TID PRN Mannie Jerel PARAS, NP       haloperidol  lactate (HALDOL ) injection 5 mg  5 mg Intramuscular TID PRN Mannie Jerel PARAS, NP       And   diphenhydrAMINE  (BENADRYL ) injection 50 mg  50 mg Intramuscular TID PRN Mannie Jerel PARAS, NP       And   LORazepam  (ATIVAN ) injection 2 mg  2 mg Intramuscular TID PRN Mannie Jerel PARAS, NP       haloperidol  lactate (HALDOL ) injection 10 mg  10 mg Intramuscular TID PRN Mannie Jerel PARAS, NP       And   diphenhydrAMINE  (BENADRYL ) injection 50 mg  50 mg Intramuscular TID PRN Mannie Jerel PARAS, NP       And   LORazepam  (ATIVAN ) injection 2 mg  2 mg Intramuscular TID PRN Mannie Jerel PARAS, NP       hydrOXYzine  (ATARAX ) tablet 25 mg  25 mg Oral Q6H PRN Mannie Jerel PARAS, NP       loperamide  (IMODIUM ) capsule 2-4 mg  2-4 mg Oral PRN Mannie Jerel PARAS, NP       LORazepam  (ATIVAN ) tablet 1 mg  1 mg Oral Q6H PRN Mannie Jerel PARAS, NP   1 mg at 11/11/23 0018   LORazepam  (ATIVAN ) tablet 1 mg  1 mg Oral QID Mannie Jerel PARAS, NP   1 mg at 11/11/23 0802   Followed by   NOREEN ON 11/12/2023] LORazepam  (ATIVAN ) tablet 1 mg  1 mg Oral TID Mannie Jerel PARAS, NP       Followed by   NOREEN ON 11/13/2023] LORazepam   (ATIVAN ) tablet 1 mg  1 mg Oral BID Mannie Jerel PARAS, NP       Followed by   NOREEN ON 11/15/2023] LORazepam  (ATIVAN ) tablet 1 mg  1 mg Oral Daily Mannie Jerel PARAS, NP       magnesium  hydroxide (MILK OF MAGNESIA) suspension 30 mL  30 mL Oral Daily PRN Mannie Jerel PARAS, NP       multivitamin with minerals tablet 1 tablet  1 tablet Oral Daily Mannie Jerel PARAS, NP   1 tablet at 11/11/23 0802   nicotine  (NICODERM CQ  - dosed in mg/24 hours) patch 21 mg  21 mg Transdermal Q0600 Mannie Jerel PARAS, NP       ondansetron  (ZOFRAN -ODT) disintegrating tablet 4 mg  4 mg Oral Q6H PRN Mannie Jerel PARAS, NP       thiamine  (VITAMIN B1) tablet 100 mg  100 mg Oral Daily Mannie Jerel PARAS, NP       PTA Medications: Medications Prior to Admission  Medication Sig Dispense Refill Last Dose/Taking   fenofibrate  (TRICOR ) 48 MG tablet Take  48 mg by mouth daily.      ibuprofen  (ADVIL ) 200 MG tablet Take 200-400 mg by mouth in the morning and at bedtime. 200 mg am, 400 mg pm      magnesium  oxide (MAG-OX) 400 (240 Mg) MG tablet Take 400 mg by mouth daily.      metoprolol  succinate (TOPROL -XL) 50 MG 24 hr tablet Take 1 tablet (50 mg total) by mouth daily with or immediately following a meal. 90 tablet 1    potassium chloride  (KLOR-CON ) 10 MEQ tablet Take 10 mEq by mouth daily.      simvastatin  (ZOCOR ) 40 MG tablet Take 40 mg by mouth daily.      thiamine  (VITAMIN B1) 100 MG tablet Take 100 mg by mouth daily.       Patient Stressors: Health problems   Substance abuse   Traumatic event    Patient Strengths: Ability for insight  Motivation for treatment/growth   Treatment Modalities: Medication Management, Group therapy, Case management,  1 to 1 session with clinician, Psychoeducation, Recreational therapy.   Physician Treatment Plan for Primary Diagnosis: Substance induced mood disorder (HCC) Long Term Goal(s):     Short Term Goals:    Medication Management: Evaluate patient's response, side effects, and tolerance  of medication regimen.  Therapeutic Interventions: 1 to 1 sessions, Unit Group sessions and Medication administration.  Evaluation of Outcomes: Not Met  Physician Treatment Plan for Secondary Diagnosis: Principal Problem:   Substance induced mood disorder (HCC)  Long Term Goal(s):     Short Term Goals:       Medication Management: Evaluate patient's response, side effects, and tolerance of medication regimen.  Therapeutic Interventions: 1 to 1 sessions, Unit Group sessions and Medication administration.  Evaluation of Outcomes: Not Met   RN Treatment Plan for Primary Diagnosis: Substance induced mood disorder (HCC) Long Term Goal(s): Knowledge of disease and therapeutic regimen to maintain health will improve  Short Term Goals: Ability to remain free from injury will improve, Ability to verbalize frustration and anger appropriately will improve, Ability to demonstrate self-control, Ability to participate in decision making will improve, Ability to verbalize feelings will improve, Ability to disclose and discuss suicidal ideas, Ability to identify and develop effective coping behaviors will improve, and Compliance with prescribed medications will improve  Medication Management: RN will administer medications as ordered by provider, will assess and evaluate patient's response and provide education to patient for prescribed medication. RN will report any adverse and/or side effects to prescribing provider.  Therapeutic Interventions: 1 on 1 counseling sessions, Psychoeducation, Medication administration, Evaluate responses to treatment, Monitor vital signs and CBGs as ordered, Perform/monitor CIWA, COWS, AIMS and Fall Risk screenings as ordered, Perform wound care treatments as ordered.  Evaluation of Outcomes: Not Met   LCSW Treatment Plan for Primary Diagnosis: Substance induced mood disorder (HCC) Long Term Goal(s): Safe transition to appropriate next level of care at discharge,  Engage patient in therapeutic group addressing interpersonal concerns.  Short Term Goals: Engage patient in aftercare planning with referrals and resources, Increase social support, Increase ability to appropriately verbalize feelings, Increase emotional regulation, Facilitate acceptance of mental health diagnosis and concerns, Facilitate patient progression through stages of change regarding substance use diagnoses and concerns, Identify triggers associated with mental health/substance abuse issues, and Increase skills for wellness and recovery  Therapeutic Interventions: Assess for all discharge needs, 1 to 1 time with Social worker, Explore available resources and support systems, Assess for adequacy in community support network, Educate family and significant  other(s) on suicide prevention, Complete Psychosocial Assessment, Interpersonal group therapy.  Evaluation of Outcomes: Not Met   Progress in Treatment: Attending groups: No. Participating in groups: No. Taking medication as prescribed: Yes. Toleration medication: Yes. Family/Significant other contact made: No, will contact:  when given permission.  Patient understands diagnosis: Yes. Discussing patient identified problems/goals with staff: Yes. Medical problems stabilized or resolved: Yes. Denies suicidal/homicidal ideation: Yes. Issues/concerns per patient self-inventory: No. Other: none  New problem(s) identified: No, Describe:  none identified.  New Short Term/Long Term Goal(s): detox, medication management for mood stabilization; elimination of SI thoughts; development of comprehensive mental wellness/sobriety plan.  Patient Goals:  To go home.  Discharge Plan or Barriers: CSW will assist pt with development of an appropriate aftercare/discharge plan.   Reason for Continuation of Hospitalization: Depression Medication stabilization Suicidal ideation  Estimated Length of Stay: 1-7 days  Last 3 [place] Suicide  Severity Risk Score: Flowsheet Row Admission (Current) from 11/10/2023 in [facility] ED from 11/09/2023 in [facility]  C-SSRS RISK CATEGORY No Risk No Risk No Risk    Last PHQ 2/9 Scores:    11/27/2022    3:30 PM 04/27/2022   12:00 PM 11/13/2021    8:54 AM  Depression screen PHQ 2/9  Decreased Interest 1 0 0  Down, Depressed, Hopeless 1 0 0  PHQ - 2 Score 2 0 0  Altered sleeping 0 0 0  Tired, decreased energy 1 0 0  Change in appetite 0 0 0  Feeling bad or failure about yourself  0 0 0  Trouble concentrating 0 0 0  Moving slowly or fidgety/restless 0 0 0  Suicidal thoughts 0 0 0  PHQ-9 Score 3 0 0  Difficult doing work/chores Not difficult at all Not difficult at all Not difficult at all    Scribe for Treatment Team: Christian JONELLE Fam, LCSW 11/11/2023 11:46 AM

## 2023-11-12 ENCOUNTER — Telehealth: Payer: Self-pay | Admitting: Family Medicine

## 2023-11-12 ENCOUNTER — Encounter: Admitting: Family Medicine

## 2023-11-12 DIAGNOSIS — F1994 Other psychoactive substance use, unspecified with psychoactive substance-induced mood disorder: Secondary | ICD-10-CM

## 2023-11-12 LAB — LIPID PANEL
Cholesterol: 164 mg/dL (ref 0–200)
HDL: 104 mg/dL (ref >40)
LDL Cholesterol: 50 mg/dL (ref 0–99)
Total CHOL/HDL Ratio: 1.6 ratio
Triglycerides: 48 mg/dL (ref <150)
VLDL: 10 mg/dL (ref 0–40)

## 2023-11-12 MED ORDER — FENOFIBRATE 54 MG PO TABS
54.0000 mg | ORAL_TABLET | Freq: Every day | ORAL | Status: DC
  Administered 2023-11-12 – 2023-11-15 (×4): 54 mg via ORAL
  Filled 2023-11-12 (×4): qty 1

## 2023-11-12 MED ORDER — POTASSIUM CHLORIDE CRYS ER 20 MEQ PO TBCR
10.0000 meq | EXTENDED_RELEASE_TABLET | Freq: Every day | ORAL | Status: DC
  Administered 2023-11-12 – 2023-11-15 (×4): 10 meq via ORAL
  Filled 2023-11-12 (×4): qty 1

## 2023-11-12 MED ORDER — MAGNESIUM OXIDE -MG SUPPLEMENT 400 (240 MG) MG PO TABS
400.0000 mg | ORAL_TABLET | Freq: Every day | ORAL | Status: DC
  Administered 2023-11-12 – 2023-11-15 (×4): 400 mg via ORAL
  Filled 2023-11-12 (×4): qty 1

## 2023-11-12 MED ORDER — SIMVASTATIN 20 MG PO TABS
40.0000 mg | ORAL_TABLET | Freq: Every day | ORAL | Status: DC
  Administered 2023-11-13 – 2023-11-15 (×3): 40 mg via ORAL
  Filled 2023-11-12: qty 2
  Filled 2023-11-12: qty 1
  Filled 2023-11-12 (×2): qty 2

## 2023-11-12 MED ORDER — TRAZODONE HCL 50 MG PO TABS
50.0000 mg | ORAL_TABLET | Freq: Every evening | ORAL | Status: DC | PRN
  Administered 2023-11-12 – 2023-11-13 (×2): 50 mg via ORAL
  Filled 2023-11-12 (×3): qty 1

## 2023-11-12 NOTE — Group Note (Signed)
 Date:  11/12/2023 Time:  2:25 PM  Group Topic/Focus:  Rediscovering Joy:   The focus of this group is to explore various ways to relieve stress in a positive manner.    Participation Level:  Did Not Attend   Skippy LITTIE Bennett 11/12/2023, 2:25 PM

## 2023-11-12 NOTE — Group Note (Signed)
 Date:  11/12/2023 Time:  9:16 PM  Group Topic/Focus:  Building Self Esteem:   The Focus of this group is helping patients become aware of the effects of self-esteem on their lives, the things they and others do that enhance or undermine their self-esteem, seeing the relationship between their level of self-esteem and the choices they make and learning ways to enhance self-esteem.    Participation Level:  Active  Participation Quality:  Appropriate, Attentive, Sharing, and Supportive  Affect:  Appropriate  Cognitive:  Appropriate  Insight: Appropriate and Good  Engagement in Group:  Engaged and Supportive  Modes of Intervention:  Discussion and Role-play  Additional Comments:     Kerri Katz 11/12/2023, 9:16 PM

## 2023-11-12 NOTE — Progress Notes (Signed)
   11/11/23 2000  Psych Admission Type (Psych Patients Only)  Admission Status Involuntary  Psychosocial Assessment  Patient Complaints Substance abuse  Eye Contact Fair  Facial Expression Flat  Affect Flat  Speech Rapid  Interaction Assertive  Motor Activity Tremors  Appearance/Hygiene Unremarkable  Behavior Characteristics Cooperative;Appropriate to situation  Mood Anxious  Aggressive Behavior  Effect No apparent injury  Thought Process  Coherency WDL  Content WDL  Delusions None reported or observed  Perception WDL  Hallucination None reported or observed  Judgment Poor  Confusion WDL  Danger to Self  Current suicidal ideation? Denies  Danger to Others  Danger to Others None reported or observed

## 2023-11-12 NOTE — Group Note (Signed)
 Recreation Therapy Group Note   Group Topic:Health and Wellness  Group Date: 11/12/2023 Start Time: 1000 End Time: 1100 Facilitators: Celestia Jeoffrey BRAVO, LRT, CTRS Location: Courtyard  Group Description: [employer]. LRT and patients played games of basketball, drew with chalk, and played corn hole while outside in the courtyard while getting fresh air and sunlight. Music was being played in the background. LRT and peers conversed about different games they have played before, what they do in their free time and anything else that is on their minds. LRT encouraged pts to drink water after being outside, sweating and getting their heart rate up.  Goal Area(s) Addressed: Patient will build on frustration tolerance skills. Patients will partake in a competitive play game with peers. Patients will gain knowledge of new leisure interest/hobby.    Affect/Mood: Appropriate   Participation Level: Active   Participation Quality: Independent   Behavior: Appropriate   Speech/Thought Process: Coherent   Insight: Fair   Judgement: Fair    Modes of Intervention: Activity   Patient Response to Interventions:  Receptive   Education Outcome:  Acknowledges education   Clinical Observations/Individualized Feedback: [place] was active in their participation of session activities and group discussion. Pt interacted well with LRT and peers duration of session.    Plan: Continue to engage patient in RT group sessions 2-3x/week.   Jeoffrey BRAVO Celestia, LRT, CTRS 11/12/2023 11:27 AM

## 2023-11-12 NOTE — Group Note (Signed)
 Recreation Therapy Group Note   Group Topic:Healthy Support Systems  Group Date: 11/12/2023 Start Time: 1530 End Time: 1620 Facilitators: Celestia Jeoffrey FORBES ARTICE, CTRS Location: Craft Room  Group Description: Straw Bridge. In groups or individually, patients were given 10 plastic drinking straws and an equal length of masking tape. Using the materials provided, patients were instructed to build a free-standing bridge-like structure to suspend an everyday item (ex: deck of cards) off the floor or table surface. All materials were required to be used in Secondary school teacher. LRT facilitated post-activity discussion reviewing the importance of having strong and healthy support systems in our lives. LRT discussed how the people in our lives serve as the tape and the deck of cards we placed on top of our straw structure are the stressors we face in daily life. LRT and pts discussed what happens in our life when things get too heavy for us , and we don't have strong supports outside of the hospital. Pt shared 2 of their healthy supports in their life aloud in the group.   Goal Area(s) Addressed:  Patient will identify 2 healthy supports in their life. Patient will identify skills to successfully complete activity. Patient will identify correlation of this activity to life post-discharge.  Patient will build on frustration tolerance skills. Patient will increase team building and communication skills.    Affect/Mood: Appropriate   Participation Level: Active and Engaged   Participation Quality: Independent   Behavior: Calm and Cooperative   Speech/Thought Process: Coherent   Insight: Good   Judgement: Good   Modes of Intervention: STEM Activity   Patient Response to Interventions:  Attentive, Engaged, Interested , and Receptive   Education Outcome:  Acknowledges education   Clinical Observations/Individualized Feedback: Christian King was active in their participation of session activities and group  discussion. Pt identified my dad and cousin as healthy supports.    Plan: Continue to engage patient in RT group sessions 2-3x/week.   Jeoffrey FORBES Celestia, LRT, CTRS 11/12/2023 5:13 PM

## 2023-11-12 NOTE — Plan of Care (Signed)
   Problem: Education: Goal: Knowledge of [facility] Education information/materials will improve Outcome: Progressing Goal: Emotional status will improve Outcome: Progressing Goal: Mental status will improve Outcome: Progressing

## 2023-11-12 NOTE — Plan of Care (Signed)
  Problem: Education: Goal: Emotional status will improve Outcome: Progressing Goal: Mental status will improve Outcome: Progressing   Problem: Coping: Goal: Ability to verbalize frustrations and anger appropriately will improve Outcome: Progressing   Problem: Health Behavior/Discharge Planning: Goal: Compliance with treatment plan for underlying cause of condition will improve Outcome: Progressing   Problem: Physical Regulation: Goal: Ability to maintain clinical measurements within normal limits will improve Outcome: Progressing   Problem: Safety: Goal: Periods of time without injury will increase Outcome: Progressing

## 2023-11-12 NOTE — Progress Notes (Signed)
 [facility] MD Progress Note  11/12/2023 12:12 PM Christian King  MRN:  [id number]   Subjective:  Chart reviewed, case discussed in multidisciplinary meeting, patient seen during rounds.   On follow-up he is alert and oriented.  He denies ongoing symptoms withdrawal.  He is tolerating Librium  taper well.  He again notes that he has a baseline tremor and is encouraged to follow-up with neurology.  He denies depression rating it 0 out of 10.  He does note anxiety about his dogs, bills, and recent separation from wife.  He denies thoughts of harming himself or others.  He does not wish to have outpatient follow-up.  Is agreeable to continue detox.  He continues to minimize alcohol use.  He denies SI, HI, and AVH.  He has not required behavioral PRNs.  He voices no concerns or complaints at this time.  Sleep: Fair  Appetite:  Fair  Past Psychiatric History: see h&P Family History:  Family History  Problem Relation Age of Onset   Hypertension Father    Diabetes Maternal Grandmother    Hypertension Paternal Grandmother    Stroke Paternal Grandmother    Social History:  Social History   Substance and Sexual Activity  Alcohol Use Yes   Alcohol/week: 12.0 standard drinks of alcohol   Types: 12 Cans of beer per week   Comment: daily     Social History   Substance and Sexual Activity  Drug Use No    Social History   Socioeconomic History   Marital status: Married    Spouse name: Not on file   Number of children: Not on file   Years of education: Not on file   Highest education level: 12th grade  Occupational History   Not on file  Tobacco Use   Smoking status: Every Day    Current packs/day: 0.25    Average packs/day: 0.2 packs/day for 38.5 years (9.6 ttl pk-yrs)    Types: Cigarettes    Start date: 05/05/1985   Smokeless tobacco: Never  Vaping Use   Vaping status: Never Used  Substance and Sexual Activity   Alcohol use: Yes    Alcohol/week: 12.0 standard drinks of alcohol     Types: 12 Cans of beer per week    Comment: daily   Drug use: No   Sexual activity: Yes  Other Topics Concern   Not on file  Social History Narrative   Unemployed.  Previously a Automotive engineer   Social Drivers of Health   Financial Resource Strain: Low Risk  (11/27/2022)   Overall Financial Resource Strain (CARDIA)    Difficulty of Paying Living Expenses: Not very hard  Food Insecurity: No Food Insecurity (11/11/2023)   Hunger Vital Sign    Worried About Running Out of Food in the Last Year: Never true    Ran Out of Food in the Last Year: Never true  Transportation Needs: No Transportation Needs (11/11/2023)   PRAPARE - Administrator, Civil Service (Medical): No    Lack of Transportation (Non-Medical): No  Physical Activity: Insufficiently Active (11/27/2022)   Exercise Vital Sign    Days of Exercise per Week: 2 days    Minutes of Exercise per Session: 10 min  Stress: No Stress Concern Present (11/27/2022)   [employer] of Occupational Health - Occupational Stress Questionnaire    Feeling of Stress : Only a little  Social Connections: Unknown (07/21/2023)   Social Connection and Isolation Panel    Frequency of  Communication with Friends and Family: More than three times a week    Frequency of Social Gatherings with Friends and Family: More than three times a week    Attends Religious Services: More than 4 times per year    Active Member of Clubs or Organizations: Patient declined    Attends Banker Meetings: Patient declined    Marital Status: Married   Past Medical History:  Past Medical History:  Diagnosis Date   Alcohol abuse 07/24/2010   B12 deficiency    Blood dyscrasia    hemachromatosis   BMI 25.0-25.9,adult 01/28/2015   Bronchitis    Degenerative joint disease (DJD) of hip    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   Depression    Essential hypertension 04/26/2010   Qualifier: Diagnosis of   By: Gwenn Grimes         GERD (gastroesophageal reflux disease)    Gout    Gout 04/26/2010   Qualifier: Diagnosis of   By: Gwenn Grimes         Hemochromatosis 10/03/2013   History of DVT (deep vein thrombosis) 05/15/2016   History of kidney stones    HLD (hyperlipidemia)    HTN (hypertension)    Hyperlipidemia 04/26/2010   Qualifier: Diagnosis of   By: Gwenn Grimes         Hypokalemia 03/17/2016   Hypothyroidism    Insomnia 02/05/2014   QT prolongation 03/20/2015   Severe alcohol use disorder (HCC) 07/21/2023   Ventricular tachycardia (HCC)    in setting of hypokalemia/ hypomagnesemia and ETOH   Vitamin B12 deficiency 04/26/2010   Qualifier: Diagnosis of   By: Gwenn Grimes         Wears glasses     Past Surgical History:  Procedure Laterality Date   TOTAL HIP ARTHROPLASTY Right    TOTAL HIP ARTHROPLASTY Left 03/16/2016   Procedure: TOTAL HIP ARTHROPLASTY;  Surgeon: Shari Sieving, MD;  Location: [facility] OR;  Service: Orthopedics;  Laterality: Left;   vascularized fibular graft     right hip for avascular necrosis    Current Medications: Current Facility-Administered Medications  Medication Dose Route Frequency Provider Last Rate Last Admin   acetaminophen  (TYLENOL ) tablet 650 mg  650 mg Oral Q6H PRN Mannie Jerel PARAS, NP   650 mg at 11/11/23 2108   alum & mag hydroxide-simeth (MAALOX/MYLANTA) 200-200-20 MG/5ML suspension 30 mL  30 mL Oral Q4H PRN Mannie Jerel PARAS, NP       chlordiazePOXIDE  (LIBRIUM ) capsule 25 mg  25 mg Oral Q6H PRN Seeley Southgate E, PA-C       chlordiazePOXIDE  (LIBRIUM ) capsule 25 mg  25 mg Oral TID Tatayana Beshears E, PA-C       Followed by   NOREEN ON 11/13/2023] chlordiazePOXIDE  (LIBRIUM ) capsule 25 mg  25 mg Oral BH-qamhs Fabiano Ginley E, PA-C       Followed by   NOREEN ON 11/15/2023] chlordiazePOXIDE  (LIBRIUM ) capsule 25 mg  25 mg Oral Daily Lular Letson E, PA-C       haloperidol  (HALDOL ) tablet 5 mg  5 mg Oral TID PRN Mannie Jerel PARAS, NP       And   diphenhydrAMINE  (BENADRYL ) capsule 50 mg  50 mg Oral TID PRN Mannie Jerel PARAS, NP       haloperidol  lactate (HALDOL ) injection 5 mg  5 mg Intramuscular TID PRN Mannie Jerel PARAS, NP       And   diphenhydrAMINE  (BENADRYL ) injection 50 mg  50  mg Intramuscular TID PRN Mannie Jerel PARAS, NP       And   LORazepam  (ATIVAN ) injection 2 mg  2 mg Intramuscular TID PRN Mannie Jerel PARAS, NP       haloperidol  lactate (HALDOL ) injection 10 mg  10 mg Intramuscular TID PRN Mannie Jerel PARAS, NP       And   diphenhydrAMINE  (BENADRYL ) injection 50 mg  50 mg Intramuscular TID PRN Mannie Jerel PARAS, NP       And   LORazepam  (ATIVAN ) injection 2 mg  2 mg Intramuscular TID PRN Mannie Jerel PARAS, NP       fenofibrate  tablet 54 mg  54 mg Oral Daily Damien Cisar E, PA-C   54 mg at 11/12/23 1048   hydrOXYzine  (ATARAX ) tablet 25 mg  25 mg Oral Q6H PRN Mannie Jerel PARAS, NP       loperamide  (IMODIUM ) capsule 2-4 mg  2-4 mg Oral PRN Mannie Jerel PARAS, NP       magnesium  hydroxide (MILK OF MAGNESIA) suspension 30 mL  30 mL Oral Daily PRN Mannie Jerel PARAS, NP       magnesium  oxide (MAG-OX) tablet 400 mg  400 mg Oral Daily Teniya Filter E, PA-C   400 mg at 11/12/23 1048   multivitamin with minerals tablet 1 tablet  1 tablet Oral Daily Mannie Jerel PARAS, NP   1 tablet at 11/12/23 9175   ondansetron  (ZOFRAN -ODT) disintegrating tablet 4 mg  4 mg Oral Q6H PRN Mannie Jerel PARAS, NP       potassium chloride  SA (KLOR-CON  M) CR tablet 10 mEq  10 mEq Oral Daily Anaijah Augsburger E, PA-C   10 mEq at 11/12/23 1048   simvastatin  (ZOCOR ) tablet 40 mg  40 mg Oral Daily Kinnedy Mongiello E, PA-C       thiamine  (VITAMIN B1) tablet 100 mg  100 mg Oral Daily Mannie Jerel PARAS, NP   100 mg at 11/12/23 9175    Lab Results:  Results for orders placed or performed during the hospital encounter of 11/10/23 (from the past 48 hours)  Lipid panel     Status: None   Collection Time: 11/12/23  6:47 AM  Result Value  Ref Range   Cholesterol 164 0 - 200 mg/dL   Triglycerides 48 <849 mg/dL   HDL 895 >59 mg/dL   Total CHOL/HDL Ratio 1.6 RATIO   VLDL 10 0 - 40 mg/dL   LDL Cholesterol 50 0 - 99 mg/dL    Comment:        Total Cholesterol/HDL:CHD Risk Coronary Heart Disease Risk Table                     Men   Women  1/2 Average Risk   3.4   3.3  Average Risk       5.0   4.4  2 X Average Risk   9.6   7.1  3 X Average Risk  23.4   11.0        Use the calculated Patient Ratio above and the CHD Risk Table to determine the patient's CHD Risk.        ATP III CLASSIFICATION (LDL):  <100     mg/dL   Optimal  899-870  mg/dL   Near or Above                    Optimal  130-159  mg/dL   Borderline  839-810  mg/dL   High  >  190     mg/dL   Very High Performed at [facility], [address] [place], [place] [address]     Blood Alcohol level:  Lab Results  Component Value Date   ETH [facility]) 11/10/2023   ETH >450 (HH) 11/10/2023    Metabolic Disorder Labs: Lab Results  Component Value Date   HGBA1C 5.3 07/21/2023   MPG 105.41 07/21/2023   No results found for: PROLACTIN Lab Results  Component Value Date   CHOL 164 11/12/2023   TRIG 48 11/12/2023   HDL 104 11/12/2023   CHOLHDL 1.6 11/12/2023   VLDL 10 11/12/2023   LDLCALC 50 11/12/2023   LDLCALC NOT CALCULATED 07/21/2023    Physical Findings: AIMS:  , ,  ,  ,    CIWA:  CIWA-Ar Total: 3 COWS:      Psychiatric Specialty Exam:  Presentation  General Appearance:  Casual  Eye Contact: Fair  Speech: Clear and Coherent  Speech Volume: Normal    Mood and Affect  Mood: Euthymic  Affect: Appropriate   Thought Process  Thought Processes: Coherent  Descriptions of Associations:Intact  Orientation:Full (Time, Place and Person)  Thought Content:WDL  Hallucinations:Hallucinations: None  Ideas of Reference:None  Suicidal Thoughts:Suicidal Thoughts: No  Homicidal Thoughts:Homicidal Thoughts:  No   Sensorium  Memory: Immediate Fair; Recent Poor; Remote Poor  Judgment: Poor  Insight: Poor   Executive Functions  Concentration: Poor  Attention Span: Poor  Recall: [employer] of Knowledge: Fair  Language: Fair   Psychomotor Activity  Psychomotor Activity: Psychomotor Activity: Tremor  Musculoskeletal: Strength & Muscle Tone: within normal limits Gait & Station: normal Assets  Assets: Communication Skills    Physical Exam: Physical Exam Vitals and nursing note reviewed.  HENT:     Head: Atraumatic.  Eyes:     Extraocular Movements: Extraocular movements intact.  Pulmonary:     Effort: Pulmonary effort is normal.  Neurological:     Mental Status: He is alert and oriented to person, place, and time.    Review of Systems  Psychiatric/Behavioral:  Negative for depression, hallucinations, substance abuse and suicidal ideas. The patient is nervous/anxious. The patient does not have insomnia.    Blood pressure 115/78, pulse 76, temperature (!) 97.3 F (36.3 C), resp. rate 16, height 5' 9 (1.753 m), weight 68.9 kg, SpO2 99%. Body mass index is 22.45 kg/m.  Diagnosis: Principal Problem:   Substance induced mood disorder (HCC)   PLAN: Safety and Monitoring:  -- Voluntary admission to inpatient psychiatric unit for safety, stabilization and treatment  -- Daily contact with patient to assess and evaluate symptoms and progress in treatment  -- Patient's case to be discussed in multi-disciplinary team meeting  -- Observation Level : q15 minute checks  -- Vital signs:  q12 hours  -- Precautions: suicide, elopement, and assault -- Encouraged patient to participate in unit milieu and in scheduled group therapies  2. Psychiatric Diagnoses and Treatment:              Alcohol use disorder      Librium  taper    CIWA protocol He continues to decline further psychotropic medication management.  Discussed suggestions for medications for anxiety and  depression.  Discussed medications for alcohol use disorder.   -- The risks/benefits/side-effects/alternatives to this medication were discussed in detail with the patient and time was given for questions. The patient consents to medication trial.                --  Metabolic profile and EKG monitoring obtained while on an atypical antipsychotic (BMI: Lipid Panel: HbgA1c: QTc:)              -- Encouraged patient to participate in unit milieu and in scheduled group therapies                            3. Medical Issues Being Addressed:  Will continue to monitor for hypokalemia and hypertension.  Home medications restarted following pharmacy review will continue to hold blood pressure medication   4. Discharge Planning:   -- Social work and case management to assist with discharge planning and identification of hospital follow-up needs prior to discharge  -- Estimated LOS: 3-4 days  Donnice FORBES Right, PA-C 11/12/2023, 12:12 PM

## 2023-11-12 NOTE — Progress Notes (Signed)
   11/12/23 1300  Psych Admission Type (Psych Patients Only)  Admission Status Involuntary  Psychosocial Assessment  Patient Complaints None  Eye Contact Fair  Facial Expression Animated  Affect Appropriate to circumstance  Speech Logical/coherent  Interaction Assertive  Motor Activity Tremors (baseline per patient)  Appearance/Hygiene Unremarkable  Behavior Characteristics Cooperative  Mood Irritable;Pleasant;Labile  Thought Process  Coherency Circumstantial  Content Blaming others  Delusions None reported or observed  Perception WDL  Hallucination None reported or observed  Judgment Poor  Confusion None  Danger to Self  Current suicidal ideation? Denies  Danger to Others  Danger to Others None reported or observed

## 2023-11-12 NOTE — Group Note (Signed)
 [facility] LCSW Group Therapy Note   Group Date: 11/12/2023 Start Time: 1300 End Time: 1400  Type of Therapy/Topic:  Group Therapy:  Feelings about Diagnosis  Participation Level:  Minimal    Description of Group:    This group will allow patients to explore their thoughts and feelings about diagnoses they have received. Patients will be guided to explore their level of understanding and acceptance of these diagnoses. Facilitator will encourage patients to process their thoughts and feelings about the reactions of others to their diagnosis, and will guide patients in identifying ways to discuss their diagnosis with significant others in their lives. This group will be process-oriented, with patients participating in exploration of their own experiences as well as giving and receiving support and challenge from other group members.   Therapeutic Goals: 1. Patient will demonstrate understanding of diagnosis as evidence by identifying two or more symptoms of the disorder:  2. Patient will be able to express two feelings regarding the diagnosis 3. Patient will demonstrate ability to communicate their needs through discussion and/or role plays  Summary of Patient Progress: Patient was present for the entirety of group. He did not speak much during the group but when he did he was on topic. Insight into the topic and himself remains questionable. Pt appeared open and receptive to feedback/comments from both his peers and the facilitator.   Therapeutic Modalities:   Cognitive Behavioral Therapy Brief Therapy Feelings Identification    Nadara JONELLE Fam, LCSW

## 2023-11-12 NOTE — Telephone Encounter (Unsigned)
 Copied from CRM [id number]. Topic: Appointments - Scheduling Inquiry for Clinic >> Nov 12, 2023  8:43 AM Tiffini S wrote: Reason for CRM: Patient fell/ hit his head- in the hospital/ unable to make appointment for 11/12/23- offered next available appointment for January. Patient said too far out and will need a hospital follow up soon as he is discharged today.   Please call the patient for transfer of care/ hospital follow up after discharge at [phone]  . He is still in the hospital and his father has his phone at this time.

## 2023-11-13 MED ORDER — CHLORDIAZEPOXIDE HCL 25 MG PO CAPS
25.0000 mg | ORAL_CAPSULE | ORAL | Status: AC
  Administered 2023-11-13: 25 mg via ORAL
  Filled 2023-11-13: qty 1

## 2023-11-13 MED ORDER — CHLORDIAZEPOXIDE HCL 25 MG PO CAPS
25.0000 mg | ORAL_CAPSULE | Freq: Every day | ORAL | Status: AC
  Administered 2023-11-14: 25 mg via ORAL
  Filled 2023-11-13: qty 1

## 2023-11-13 NOTE — Group Note (Signed)
 Date:  11/13/2023 Time:  8:43 PM  Group Topic/Focus:  Wrap-Up Group:   The focus of this group is to help patients review their daily goal of treatment and discuss progress on daily workbooks.    Participation Level:  Active  Participation Quality:  Appropriate and Attentive  Affect:  Appropriate  Cognitive:  Appropriate  Insight: Appropriate and Good  Engagement in Group:  Engaged  Modes of Intervention:  Orientation  Additional Comments:    Bonnielee LOISE Pepper 11/13/2023, 8:43 PM

## 2023-11-13 NOTE — Plan of Care (Signed)
   Problem: Education: Goal: Emotional status will improve Outcome: Progressing Goal: Mental status will improve Outcome: Progressing Goal: Verbalization of understanding the information provided will improve Outcome: Progressing

## 2023-11-13 NOTE — [facility] Counselor (Signed)
 CSW spoke with the patient's father, Christian King, [phone].  Father reports that the patient lays in the house for the past 6 months drunk.  He reports that I went to the house yesterday to clean it out, I have never seen so many wine and liquor bottles.    He reports that pt has been down since his wife left him about 4 weeks ago.    He reports that the patient is a danger to self.  She reports that he has threatened to kill himself.  He's said he's got a pistol and a shotgun.  I don't think he will kill himself but will drink himself to death.  He reports that the patient's neighbor has the pistols and he(fahter) has plans to return to the home on 11/14/23 and remove the shotgun from the home.    He reports that the patient is in danger of losing him home and he says when he loses his home he will move in with me, no he wont, I am not letting a drunk move in with me.  He reports he wants me to get his house but if I buy it I am going to sell it.  Father reports that all he wants to do is drink himself to death, he takes after his mama, she struggled with everything.  He reports that the patient's son is 51 and wont have nothing to do with him.  This contradicts what patient told this CSW.     He reports he(pt) lives in he past and can't get over his first wife leaving him.  He elaborates that the wife left the patient due to his alcohol consumption.    Father reports that he will not be able to pick up the patient up at discharge and a cab is needed.  He reports that he does not wish for the patient to be discharged but to go to treatment for at least 6 months.   Christian King, MSW, LCSW 11/13/2023 4:16 PM

## 2023-11-13 NOTE — [facility] Counselor (Signed)
 CSW spoke with the patient in regards to aftercare plans.   Patient declined aftercare referrals at this time.   Sherryle Margo, MSW, LCSW 11/13/2023 3:23 PM

## 2023-11-13 NOTE — [facility] Suicide Risk Assessment (Signed)
 [facility] INPATIENT:  Family/Significant Other Suicide Prevention Education  Suicide Prevention Education:  Education Completed; Christian King, father, [phone],  (name of family member/significant other) has been identified by the patient as the family member/significant other with whom the patient will be residing, and identified as the person(s) who will aid the patient in the event of a mental health crisis (suicidal ideations/suicide attempt).  With written consent from the patient, the family member/significant other has been provided the following suicide prevention education, prior to the and/or following the discharge of the patient.  The suicide prevention education provided includes the following: Suicide risk factors Suicide prevention and interventions National Suicide Hotline telephone number [facility] assessment telephone number [facility] Emergency Assistance 911 [facility] and/or Residential Mobile Crisis Unit telephone number  Request made of family/significant other to: Remove weapons (e.g., guns, rifles, knives), all items previously/currently identified as safety concern.   Remove drugs/medications (over-the-counter, prescriptions, illicit drugs), all items previously/currently identified as a safety concern.  The family member/significant other verbalizes understanding of the suicide prevention education information provided.  The family member/significant other agrees to remove the items of safety concern listed above.  RAJ LANDRESS 11/13/2023, 4:01 PM

## 2023-11-13 NOTE — Progress Notes (Signed)
   11/13/23 0525  Psych Admission Type (Psych Patients Only)  Admission Status Involuntary  Psychosocial Assessment  Patient Complaints None  Eye Contact Fair  Facial Expression Animated  Affect Appropriate to circumstance  Speech Logical/coherent  Interaction Assertive  Motor Activity Tremors  Appearance/Hygiene Unremarkable  Behavior Characteristics Cooperative  Mood Irritable;Pleasant  Thought Process  Coherency Circumstantial  Content Blaming others  Delusions None reported or observed  Perception WDL  Hallucination None reported or observed  Judgment Poor  Confusion None  Danger to Self  Current suicidal ideation? Denies  Danger to Others  Danger to Others None reported or observed

## 2023-11-13 NOTE — Group Note (Signed)
 Date:  11/13/2023 Time:  5:00 PM  Group Topic/Focus:  Personal Choices and Values:   The focus of this group is to help patients assess and explore the importance of values in their lives, how their values affect their decisions, how they express their values and what opposes their expression.    Participation Level:  Did Not Attend   Camellia HERO Nayomi Tabron 11/13/2023, 5:00 PM

## 2023-11-13 NOTE — Group Note (Signed)
 LCSW Group Therapy Note  Group Date: 11/13/2023 Start Time: 1248 End Time: 1347   Type of Therapy and Topic:  Group Therapy: Positive Affirmations  Participation Level:  Active   Description of Group:   This group addressed positive affirmation towards self and others.  Patients went around the room and identified two positive things about themselves and two positive things about a peer in the room.  Patients reflected on how it felt to share something positive with others, to identify positive things about themselves, and to hear positive things from others/ Patients were encouraged to have a daily reflection of positive characteristics or circumstances.   Therapeutic Goals: Patients will verbalize two of their positive qualities Patients will demonstrate empathy for others by stating two positive qualities about a peer in the group Patients will verbalize their feelings when voicing positive self affirmations and when voicing positive affirmations of others Patients will discuss the potential positive impact on their wellness/recovery of focusing on positive traits of self and others.  Summary of Patient Progress: Patient actively engaged in the discussion and was able to identify positive affirmations about themself as well as other group members. Patient demonstrated proficient insight into the subject matter, was respectful of peers, participated throughout the entire session.  Therapeutic Modalities:   Cognitive Behavioral Therapy Motivational Interviewing    Alveta CHRISTELLA Kerns, [place] 11/13/2023  1:52 PM

## 2023-11-13 NOTE — Progress Notes (Signed)
 [facility] MD Progress Note  11/13/2023 12:02 PM Christian King  MRN:  [id number]   Subjective:  Chart reviewed, case discussed in multidisciplinary meeting, patient seen during rounds.  9/24:On follow-up he is alert and oriented.  He denies ongoing symptoms withdrawal.  He is tolerating Librium  taper well.  He denies depression rating it 0 out of 10.  He does note anxiety about his dogs, bills, and recent separation from wife.  He denies thoughts of harming himself or others.  He does not wish to have outpatient follow-up.  Is agreeable to continue detox.  Declines residential.  He denies SI, HI, and AVH.  He has not required behavioral PRNs.  He voices no concerns or complaints at this time. Discussed following up with neurology for treating his baseline tremor and he declined. He indicated that he does not want staff to speak with the wife/exwife regarding his care. Plan for discharge fri/sat if they continue to progress. Most recent CIWA 0,3,3.   9/23 On follow-up he is alert and oriented.  He denies ongoing symptoms withdrawal.  He is tolerating Librium  taper well.  He again notes that he has a baseline tremor and is encouraged to follow-up with neurology.  He denies depression rating it 0 out of 10.  He does note anxiety about his dogs, bills, and recent separation from wife.  He denies thoughts of harming himself or others.  He does not wish to have outpatient follow-up.  Is agreeable to continue detox.  He continues to minimize alcohol use.  He denies SI, HI, and AVH.  He has not required behavioral PRNs.  He voices no concerns or complaints at this time.  Sleep: Fair  Appetite:  Fair  Past Psychiatric History: see h&P Family History:  Family History  Problem Relation Age of Onset   Hypertension Father    Diabetes Maternal Grandmother    Hypertension Paternal Grandmother    Stroke Paternal Grandmother    Social History:  Social History   Substance and Sexual Activity  Alcohol Use Yes    Alcohol/week: 12.0 standard drinks of alcohol   Types: 12 Cans of beer per week   Comment: daily     Social History   Substance and Sexual Activity  Drug Use No    Social History   Socioeconomic History   Marital status: Married    Spouse name: Not on file   Number of children: Not on file   Years of education: Not on file   Highest education level: 12th grade  Occupational History   Not on file  Tobacco Use   Smoking status: Every Day    Current packs/day: 0.25    Average packs/day: 0.3 packs/day for 38.5 years (9.6 ttl pk-yrs)    Types: Cigarettes    Start date: 05/05/1985   Smokeless tobacco: Never  Vaping Use   Vaping status: Never Used  Substance and Sexual Activity   Alcohol use: Yes    Alcohol/week: 12.0 standard drinks of alcohol    Types: 12 Cans of beer per week    Comment: daily   Drug use: No   Sexual activity: Yes  Other Topics Concern   Not on file  Social History Narrative   Unemployed.  Previously a Automotive engineer   Social Drivers of Health   Financial Resource Strain: Low Risk  (11/27/2022)   Overall Financial Resource Strain (CARDIA)    Difficulty of Paying Living Expenses: Not very hard  Food Insecurity: No Food  Insecurity (11/11/2023)   Hunger Vital Sign    Worried About Running Out of Food in the Last Year: Never true    Ran Out of Food in the Last Year: Never true  Transportation Needs: No Transportation Needs (11/11/2023)   PRAPARE - Administrator, Civil Service (Medical): No    Lack of Transportation (Non-Medical): No  Physical Activity: Insufficiently Active (11/27/2022)   Exercise Vital Sign    Days of Exercise per Week: 2 days    Minutes of Exercise per Session: 10 min  Stress: No Stress Concern Present (11/27/2022)   [employer] of Occupational Health - Occupational Stress Questionnaire    Feeling of Stress : Only a little  Social Connections: Unknown (07/21/2023)   Social Connection and Isolation  Panel    Frequency of Communication with Friends and Family: More than three times a week    Frequency of Social Gatherings with Friends and Family: More than three times a week    Attends Religious Services: More than 4 times per year    Active Member of [employer] or Organizations: Patient declined    Attends Banker Meetings: Patient declined    Marital Status: Married   Past Medical History:  Past Medical History:  Diagnosis Date   Alcohol abuse 07/24/2010   B12 deficiency    Blood dyscrasia    hemachromatosis   BMI 25.0-25.9,adult 01/28/2015   Bronchitis    Degenerative joint disease (DJD) of hip    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   Depression    Essential hypertension 04/26/2010   Qualifier: Diagnosis of   By: Gwenn Grimes         GERD (gastroesophageal reflux disease)    Gout    Gout 04/26/2010   Qualifier: Diagnosis of   By: Gwenn Grimes         Hemochromatosis 10/03/2013   History of DVT (deep vein thrombosis) 05/15/2016   History of kidney stones    HLD (hyperlipidemia)    HTN (hypertension)    Hyperlipidemia 04/26/2010   Qualifier: Diagnosis of   By: Gwenn Grimes         Hypokalemia 03/17/2016   Hypothyroidism    Insomnia 02/05/2014   QT prolongation 03/20/2015   Severe alcohol use disorder (HCC) 07/21/2023   Ventricular tachycardia (HCC)    in setting of hypokalemia/ hypomagnesemia and ETOH   Vitamin B12 deficiency 04/26/2010   Qualifier: Diagnosis of   By: Gwenn Grimes         Wears glasses     Past Surgical History:  Procedure Laterality Date   TOTAL HIP ARTHROPLASTY Right    TOTAL HIP ARTHROPLASTY Left 03/16/2016   Procedure: TOTAL HIP ARTHROPLASTY;  Surgeon: Shari Sieving, MD;  Location: [facility] OR;  Service: Orthopedics;  Laterality: Left;   vascularized fibular graft     right hip for avascular necrosis    Current Medications: Current Facility-Administered Medications  Medication Dose Route Frequency  Provider Last Rate Last Admin   acetaminophen  (TYLENOL ) tablet 650 mg  650 mg Oral Q6H PRN Mannie Jerel PARAS, NP   650 mg at 11/12/23 2114   alum & mag hydroxide-simeth (MAALOX/MYLANTA) 200-200-20 MG/5ML suspension 30 mL  30 mL Oral Q4H PRN Mannie Jerel PARAS, NP       chlordiazePOXIDE  (LIBRIUM ) capsule 25 mg  25 mg Oral Q6H PRN Ashleyann Shoun E, PA-C       chlordiazePOXIDE  (LIBRIUM ) capsule 25 mg  25 mg Oral TID Aayra Hornbaker  E, PA-C   25 mg at 11/13/23 9175   Followed by   chlordiazePOXIDE  (LIBRIUM ) capsule 25 mg  25 mg Oral BH-qamhs Jamarkus Lisbon, Donnice BRAVO, PA-C       Followed by   NOREEN ON 11/14/2023] chlordiazePOXIDE  (LIBRIUM ) capsule 25 mg  25 mg Oral Daily Aundrea Horace E, PA-C       haloperidol  (HALDOL ) tablet 5 mg  5 mg Oral TID PRN Mannie Jerel PARAS, NP       And   diphenhydrAMINE  (BENADRYL ) capsule 50 mg  50 mg Oral TID PRN Mannie Jerel PARAS, NP       haloperidol  lactate (HALDOL ) injection 5 mg  5 mg Intramuscular TID PRN Mannie Jerel PARAS, NP       And   diphenhydrAMINE  (BENADRYL ) injection 50 mg  50 mg Intramuscular TID PRN Mannie Jerel PARAS, NP       And   LORazepam  (ATIVAN ) injection 2 mg  2 mg Intramuscular TID PRN Mannie Jerel PARAS, NP       haloperidol  lactate (HALDOL ) injection 10 mg  10 mg Intramuscular TID PRN Mannie Jerel PARAS, NP       And   diphenhydrAMINE  (BENADRYL ) injection 50 mg  50 mg Intramuscular TID PRN Mannie Jerel PARAS, NP       And   LORazepam  (ATIVAN ) injection 2 mg  2 mg Intramuscular TID PRN Mannie Jerel PARAS, NP       fenofibrate  tablet 54 mg  54 mg Oral Daily Shiloh Southern E, PA-C   54 mg at 11/13/23 9175   hydrOXYzine  (ATARAX ) tablet 25 mg  25 mg Oral Q6H PRN Mannie Jerel PARAS, NP       loperamide  (IMODIUM ) capsule 2-4 mg  2-4 mg Oral PRN Mannie Jerel PARAS, NP       magnesium  hydroxide (MILK OF MAGNESIA) suspension 30 mL  30 mL Oral Daily PRN Mannie Jerel PARAS, NP       magnesium  oxide (MAG-OX) tablet 400 mg  400 mg Oral Daily Keshayla Schrum,  Khalif Stender E, PA-C   400 mg at 11/13/23 0825   multivitamin with minerals tablet 1 tablet  1 tablet Oral Daily Mannie Jerel PARAS, NP   1 tablet at 11/13/23 9175   ondansetron  (ZOFRAN -ODT) disintegrating tablet 4 mg  4 mg Oral Q6H PRN Mannie Jerel PARAS, NP       potassium chloride  SA (KLOR-CON  M) CR tablet 10 mEq  10 mEq Oral Daily Ayyub Krall E, PA-C   10 mEq at 11/13/23 0825   simvastatin  (ZOCOR ) tablet 40 mg  40 mg Oral Daily Demar Shad E, PA-C   40 mg at 11/13/23 0825   thiamine  (VITAMIN B1) tablet 100 mg  100 mg Oral Daily Mannie Jerel PARAS, NP   100 mg at 11/13/23 9175   traZODone  (DESYREL ) tablet 50 mg  50 mg Oral QHS PRN Devonna Oboyle E, PA-C   50 mg at 11/12/23 2114    Lab Results:  Results for orders placed or performed during the hospital encounter of 11/10/23 (from the past 48 hours)  Lipid panel     Status: None   Collection Time: 11/12/23  6:47 AM  Result Value Ref Range   Cholesterol 164 0 - 200 mg/dL   Triglycerides 48 <849 mg/dL   HDL 895 >59 mg/dL   Total CHOL/HDL Ratio 1.6 RATIO   VLDL 10 0 - 40 mg/dL   LDL Cholesterol 50 0 - 99 mg/dL    Comment:  Total Cholesterol/HDL:CHD Risk Coronary Heart Disease Risk Table                     Men   Women  1/2 Average Risk   3.4   3.3  Average Risk       5.0   4.4  2 X Average Risk   9.6   7.1  3 X Average Risk  23.4   11.0        Use the calculated Patient Ratio above and the CHD Risk Table to determine the patient's CHD Risk.        ATP III CLASSIFICATION (LDL):  <100     mg/dL   Optimal  899-870  mg/dL   Near or Above                    Optimal  130-159  mg/dL   Borderline  839-810  mg/dL   High  >809     mg/dL   Very High Performed at [facility], [address] [place], [place] [address]     Blood Alcohol level:  Lab Results  Component Value Date   ETH [facility]) 11/10/2023   ETH >450 ([facility]) 11/10/2023    Metabolic Disorder Labs: Lab Results  Component Value Date   HGBA1C  5.3 07/21/2023   MPG 105.41 07/21/2023   No results found for: PROLACTIN Lab Results  Component Value Date   CHOL 164 11/12/2023   TRIG 48 11/12/2023   HDL 104 11/12/2023   CHOLHDL 1.6 11/12/2023   VLDL 10 11/12/2023   LDLCALC 50 11/12/2023   LDLCALC NOT CALCULATED 07/21/2023    Physical Findings: AIMS:  , ,  ,  ,    CIWA:  CIWA-Ar Total: 0 COWS:      Psychiatric Specialty Exam:  Presentation  General Appearance:  Casual  Eye Contact: Fair  Speech: Clear and Coherent  Speech Volume: Normal    Mood and Affect  Mood: Euthymic  Affect: Appropriate   Thought Process  Thought Processes: Coherent  Descriptions of Associations:Intact  Orientation:Full (Time, Place and Person)  Thought Content:WDL  Hallucinations:No data recorded  Ideas of Reference:None  Suicidal Thoughts:No data recorded  Homicidal Thoughts:No data recorded   Sensorium  Memory: Immediate Fair; Recent Poor; Remote Poor  Judgment: Poor  Insight: Poor   Executive Functions  Concentration: Poor  Attention Span: Poor  Recall: [employer] of Knowledge: Fair  Language: Fair   Psychomotor Activity  Psychomotor Activity: No data recorded  Musculoskeletal: Strength & Muscle Tone: within normal limits Gait & Station: normal Assets  Assets: Communication Skills    Physical Exam: Physical Exam Vitals and nursing note reviewed.  HENT:     Head: Atraumatic.  Eyes:     Extraocular Movements: Extraocular movements intact.  Pulmonary:     Effort: Pulmonary effort is normal.  Neurological:     Mental Status: He is alert and oriented to person, place, and time.    Review of Systems  Psychiatric/Behavioral:  Negative for depression, hallucinations, substance abuse and suicidal ideas. The patient is nervous/anxious. The patient does not have insomnia.    Blood pressure 99/71, pulse 85, temperature 97.7 F (36.5 C), resp. rate 20, height 5' 9 (1.753 m),  weight 68.9 kg, SpO2 99%. Body mass index is 22.45 kg/m.  Diagnosis: Principal Problem:   Substance induced mood disorder (HCC)   PLAN: Safety and Monitoring:  -- Voluntary admission to inpatient psychiatric unit for  safety, stabilization and treatment  -- Daily contact with patient to assess and evaluate symptoms and progress in treatment  -- Patient's case to be discussed in multi-disciplinary team meeting  -- Observation Level : q15 minute checks  -- Vital signs:  q12 hours  -- Precautions: suicide, elopement, and assault -- Encouraged patient to participate in unit milieu and in scheduled group therapies  2. Psychiatric Diagnoses and Treatment:              Alcohol use disorder      Librium  taper    CIWA protocol He continues to decline further psychotropic medication management.  Discussed suggestions for medications for anxiety and depression.  Discussed medications for alcohol use disorder.   -- The risks/benefits/side-effects/alternatives to this medication were discussed in detail with the patient and time was given for questions. The patient consents to medication trial.                -- Metabolic profile and EKG monitoring obtained while on an atypical antipsychotic (BMI: Lipid Panel: HbgA1c: QTc:)              -- Encouraged patient to participate in unit milieu and in scheduled group therapies                            3. Medical Issues Being Addressed:  Will continue to monitor for hypokalemia and hypertension.  Home medications restarted following pharmacy review will continue to hold blood pressure medication Declined neuro follow up   4. Discharge Planning:   -- plan for discharge fri/sat  -- Social work and case management to assist with discharge planning and identification of hospital follow-up needs prior to discharge  -- Estimated LOS: 3-4 days  Donnice FORBES Right, PA-C 11/13/2023, 12:02 PM

## 2023-11-13 NOTE — Progress Notes (Addendum)
 D- Patient alert and oriented. . Denies SI, HI, AVH, and pain. Reports that he does not want staff speaking with his wife and would rather phone calls from her be directed to him.  A- Scheduled medications administered to patient, per MAR. Support and encouragement provided.  Routine safety checks conducted every 15 minutes.  Patient informed to notify staff with problems or concerns.  R- No adverse drug reactions noted. Patient contracts for safety at this time. Patient compliant with medications and treatment plan. Patient receptive, calm, and cooperative. Patient interacts well with others on the unit.  Patient remains safe at this time.      11/13/23 0824  Psych Admission Type (Psych Patients Only)  Admission Status Involuntary  Psychosocial Assessment  Patient Complaints Anxiety  Eye Contact Fair  Facial Expression Animated  Affect Preoccupied  Speech Tangential  Interaction Assertive  Motor Activity Tremors  Appearance/Hygiene Unremarkable  Behavior Characteristics Cooperative  Mood Pleasant;Preoccupied  Thought Process  Coherency Circumstantial  Content Blaming others  Delusions None reported or observed  Perception WDL  Hallucination None reported or observed  Judgment Impaired  Confusion None  Danger to Self  Current suicidal ideation? Denies  Agreement Not to Harm Self Yes  Description of Agreement Verbal  Danger to Others  Danger to Others None reported or observed

## 2023-11-13 NOTE — Group Note (Signed)
 Date:  11/13/2023 Time:  5:07 PM  Group Topic/Focus:  Activity Group: The focus of the group is to promote activity and encourage patients to go outside to the courtyard to get some fresh air and some exercise.    Participation Level:  Active  Participation Quality:  Appropriate  Affect:  Appropriate  Cognitive:  Appropriate  Insight: Appropriate  Engagement in Group:  Engaged  Modes of Intervention:  Activity  Additional Comments:    Christian King Christian King 11/13/2023, 5:07 PM

## 2023-11-13 NOTE — Plan of Care (Signed)

## 2023-11-14 MED ORDER — DIPHENHYDRAMINE HCL 25 MG PO CAPS
25.0000 mg | ORAL_CAPSULE | Freq: Every day | ORAL | Status: DC | PRN
  Administered 2023-11-14: 25 mg via ORAL
  Filled 2023-11-14: qty 1

## 2023-11-14 NOTE — Group Note (Signed)
 Date:  11/14/2023 Time:  6:49 PM  Group Topic/Focus:  Wellness Toolbox:   The focus of this group is to discuss various aspects of wellness, balancing those aspects and exploring ways to increase the ability to experience wellness.  Patients will create a wellness toolbox for use upon discharge.    Participation Level:  Active  Participation Quality:  Appropriate  Affect:  Appropriate  Cognitive:  Appropriate  Insight: Appropriate  Engagement in Group:  Engaged  Modes of Intervention:  Activity and Socialization  Additional Comments:    [name] 11/14/2023, 6:49 PM

## 2023-11-14 NOTE — Group Note (Signed)
 Recreation Therapy Group Note   Group Topic:Relaxation  Group Date: 11/14/2023 Start Time: 1530 End Time: 1610 Facilitators: Celestia Jeoffrey BRAVO, LRT, CTRS Location: Dayroom  Group Description: PMR (Progressive Muscle Relaxation). LRT educates patients on what PMR is and the benefits that come from it. Patients are asked to sit with their feet flat on the floor while sitting up and all the way back in their chair, if possible. LRT and pts follow a prompt through a speaker that requires you to tense and release different muscles in their body and focus on their breathing. During session, lights are off and soft music is being played. Pts are given a stress ball to use if needed.   Goal Area(s) Addressed:  Patients will be able to describe progressive muscle relaxation.  Patient will practice using relaxation technique. Patient will identify a new coping skill.  Patient will follow multistep directions to reduce anxiety and stress.   Affect/Mood: Appropriate   Participation Level: Active   Participation Quality: Independent   Behavior: Calm and Cooperative   Speech/Thought Process: Coherent   Insight: Good   Judgement: Good   Modes of Intervention: Education and Exploration   Patient Response to Interventions:  Attentive, Engaged, and Receptive   Education Outcome:  Acknowledges education   Clinical Observations/Individualized Feedback: Cable was active in their participation of session activities and group discussion. Pt completed all exercises as prompted.    Plan: Continue to engage patient in RT group sessions 2-3x/week.   [address], LRT, CTRS 11/14/2023 5:22 PM

## 2023-11-14 NOTE — Progress Notes (Signed)
 [facility] MD Progress Note  11/14/2023 1:49 PM Christian King  MRN:  [id number]   Subjective:  Chart reviewed, case discussed in multidisciplinary meeting, patient seen during rounds.  9/25: On follow-up he is alert and oriented.  He is pleasant and cooperative.  He denies ongoing symptoms of withdrawal.  Librium  taper completes today.  He rates depression at 0 out of 10.  He denies SI, HI, and AVH.  He cites his social support as his father.  He request discharge for tomorrow.  He does not meet IVC criteria he is not agreeable to continued hospitalization.  He is not interested in residential treatment or further outpatient substance use resources.  We will provide them at discharge nonetheless.  He voices no further concerns or complaints.  He is able to identify crisis resources including 911, [id number], and presenting to the emergency department.  He is able to discuss coping mechanisms.  He is agitated or aggressive behavior.  He has not required behavioral PRNs.  He has had no self-harm behavior. CIWA 3,0,3  I spoke with Father Antiono Ettinger Senior via phone who indicates he removed the pistol from the home and is going back this afternoon to remove the rest of the firearms. He states he has no concern that his son would intentionally harm him self and feels safe with him coming home. His only concern is that his son drinks to much and he wishes, he would get help with that.   9/24:On follow-up he is alert and oriented.  He denies ongoing symptoms withdrawal.  He is tolerating Librium  taper well.  He denies depression rating it 0 out of 10.  He does note anxiety about his dogs, bills, and recent separation from wife.  He denies thoughts of harming himself or others.  He does not wish to have outpatient follow-up.  Is agreeable to continue detox.  Declines residential.  He denies SI, HI, and AVH.  He has not required behavioral PRNs.  He voices no concerns or complaints at this time. Discussed following up  with neurology for treating his baseline tremor and he declined. He indicated that he does not want staff to speak with the wife/exwife regarding his care. Plan for discharge fri/sat if they continue to progress. Most recent CIWA 0,3,3.   9/23 On follow-up he is alert and oriented.  He denies ongoing symptoms withdrawal.  He is tolerating Librium  taper well.  He again notes that he has a baseline tremor and is encouraged to follow-up with neurology.  He denies depression rating it 0 out of 10.  He does note anxiety about his dogs, bills, and recent separation from wife.  He denies thoughts of harming himself or others.  He does not wish to have outpatient follow-up.  Is agreeable to continue detox.  He continues to minimize alcohol use.  He denies SI, HI, and AVH.  He has not required behavioral PRNs.  He voices no concerns or complaints at this time.  Sleep: Fair  Appetite:  Fair  Past Psychiatric History: see h&P Family History:  Family History  Problem Relation Age of Onset   Hypertension Father    Diabetes Maternal Grandmother    Hypertension Paternal Grandmother    Stroke Paternal Grandmother    Social History:  Social History   Substance and Sexual Activity  Alcohol Use Yes   Alcohol/week: 12.0 standard drinks of alcohol   Types: 12 Cans of beer per week   Comment: daily  Social History   Substance and Sexual Activity  Drug Use No    Social History   Socioeconomic History   Marital status: Married    Spouse name: Not on file   Number of children: Not on file   Years of education: Not on file   Highest education level: 12th grade  Occupational History   Not on file  Tobacco Use   Smoking status: Every Day    Current packs/day: 0.25    Average packs/day: 0.2 packs/day for 38.5 years (9.6 ttl pk-yrs)    Types: Cigarettes    Start date: 05/05/1985   Smokeless tobacco: Never  Vaping Use   Vaping status: Never Used  Substance and Sexual Activity   Alcohol use:  Yes    Alcohol/week: 12.0 standard drinks of alcohol    Types: 12 Cans of beer per week    Comment: daily   Drug use: No   Sexual activity: Yes  Other Topics Concern   Not on file  Social History Narrative   Unemployed.  Previously a Automotive engineer   Social Drivers of Health   Financial Resource Strain: Low Risk  (11/27/2022)   Overall Financial Resource Strain (CARDIA)    Difficulty of Paying Living Expenses: Not very hard  Food Insecurity: No Food Insecurity (11/11/2023)   Hunger Vital Sign    Worried About Running Out of Food in the Last Year: Never true    Ran Out of Food in the Last Year: Never true  Transportation Needs: No Transportation Needs (11/11/2023)   PRAPARE - Administrator, Civil Service (Medical): No    Lack of Transportation (Non-Medical): No  Physical Activity: Insufficiently Active (11/27/2022)   Exercise Vital Sign    Days of Exercise per Week: 2 days    Minutes of Exercise per Session: 10 min  Stress: No Stress Concern Present (11/27/2022)   [employer] of Occupational Health - Occupational Stress Questionnaire    Feeling of Stress : Only a little  Social Connections: Unknown (07/21/2023)   Social Connection and Isolation Panel    Frequency of Communication with Friends and Family: More than three times a week    Frequency of Social Gatherings with Friends and Family: More than three times a week    Attends Religious Services: More than 4 times per year    Active Member of [employer] or Organizations: Patient declined    Attends Banker Meetings: Patient declined    Marital Status: Married   Past Medical History:  Past Medical History:  Diagnosis Date   Alcohol abuse 07/24/2010   B12 deficiency    Blood dyscrasia    hemachromatosis   BMI 25.0-25.9,adult 01/28/2015   Bronchitis    Degenerative joint disease (DJD) of hip    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   Depression    Essential hypertension  04/26/2010   Qualifier: Diagnosis of   By: Gwenn Grimes         GERD (gastroesophageal reflux disease)    Gout    Gout 04/26/2010   Qualifier: Diagnosis of   By: Gwenn Grimes         Hemochromatosis 10/03/2013   History of DVT (deep vein thrombosis) 05/15/2016   History of kidney stones    HLD (hyperlipidemia)    HTN (hypertension)    Hyperlipidemia 04/26/2010   Qualifier: Diagnosis of   By: Gwenn Grimes         Hypokalemia 03/17/2016  Hypothyroidism    Insomnia 02/05/2014   QT prolongation 03/20/2015   Severe alcohol use disorder (HCC) 07/21/2023   Ventricular tachycardia (HCC)    in setting of hypokalemia/ hypomagnesemia and ETOH   Vitamin B12 deficiency 04/26/2010   Qualifier: Diagnosis of   By: Gwenn Grimes         Wears glasses     Past Surgical History:  Procedure Laterality Date   TOTAL HIP ARTHROPLASTY Right    TOTAL HIP ARTHROPLASTY Left 03/16/2016   Procedure: TOTAL HIP ARTHROPLASTY;  Surgeon: Shari Sieving, MD;  Location: [facility] OR;  Service: Orthopedics;  Laterality: Left;   vascularized fibular graft     right hip for avascular necrosis    Current Medications: Current Facility-Administered Medications  Medication Dose Route Frequency Provider Last Rate Last Admin   acetaminophen  (TYLENOL ) tablet 650 mg  650 mg Oral Q6H PRN Mannie Jerel PARAS, NP   650 mg at 11/13/23 2203   alum & mag hydroxide-simeth (MAALOX/MYLANTA) 200-200-20 MG/5ML suspension 30 mL  30 mL Oral Q4H PRN Mannie Jerel PARAS, NP       chlordiazePOXIDE  (LIBRIUM ) capsule 25 mg  25 mg Oral Q6H PRN Guliana Weyandt E, PA-C       haloperidol  (HALDOL ) tablet 5 mg  5 mg Oral TID PRN Mannie Jerel PARAS, NP       And   diphenhydrAMINE  (BENADRYL ) capsule 50 mg  50 mg Oral TID PRN Mannie Jerel PARAS, NP       haloperidol  lactate (HALDOL ) injection 5 mg  5 mg Intramuscular TID PRN Mannie Jerel PARAS, NP       And   diphenhydrAMINE  (BENADRYL ) injection 50 mg  50 mg Intramuscular TID PRN Mannie Jerel PARAS, NP       And   LORazepam  (ATIVAN ) injection 2 mg  2 mg Intramuscular TID PRN Mannie Jerel PARAS, NP       haloperidol  lactate (HALDOL ) injection 10 mg  10 mg Intramuscular TID PRN Mannie Jerel PARAS, NP       And   diphenhydrAMINE  (BENADRYL ) injection 50 mg  50 mg Intramuscular TID PRN Mannie Jerel PARAS, NP       And   LORazepam  (ATIVAN ) injection 2 mg  2 mg Intramuscular TID PRN Mannie Jerel PARAS, NP       fenofibrate  tablet 54 mg  54 mg Oral Daily Keera Altidor E, PA-C   54 mg at 11/14/23 0830   magnesium  hydroxide (MILK OF MAGNESIA) suspension 30 mL  30 mL Oral Daily PRN Mannie Jerel PARAS, NP       magnesium  oxide (MAG-OX) tablet 400 mg  400 mg Oral Daily Doylene Splinter E, PA-C   400 mg at 11/14/23 0830   multivitamin with minerals tablet 1 tablet  1 tablet Oral Daily Mannie Jerel PARAS, NP   1 tablet at 11/14/23 0830   potassium chloride  SA (KLOR-CON  M) CR tablet 10 mEq  10 mEq Oral Daily [place] Merida E, PA-C   10 mEq at 11/14/23 0830   simvastatin  (ZOCOR ) tablet 40 mg  40 mg Oral Daily Temeca Somma E, PA-C   40 mg at 11/14/23 0830   thiamine  (VITAMIN B1) tablet 100 mg  100 mg Oral Daily Mannie Jerel PARAS, NP   100 mg at 11/14/23 0830   traZODone  (DESYREL ) tablet 50 mg  50 mg Oral QHS PRN [place] Youngblood E, PA-C   50 mg at 11/13/23 2203    Lab Results:  No results found for this or any  previous visit (from the past 48 hours).   Blood Alcohol level:  Lab Results  Component Value Date   ETH 381 (HH) 11/10/2023   ETH >450 (HH) 11/10/2023    Metabolic Disorder Labs: Lab Results  Component Value Date   HGBA1C 5.3 07/21/2023   MPG 105.41 07/21/2023   No results found for: PROLACTIN Lab Results  Component Value Date   CHOL 164 11/12/2023   TRIG 48 11/12/2023   HDL 104 11/12/2023   CHOLHDL 1.6 11/12/2023   VLDL 10 11/12/2023   LDLCALC 50 11/12/2023   LDLCALC NOT CALCULATED 07/21/2023    Physical Findings: AIMS:  , ,  ,  ,    CIWA:  CIWA-Ar  Total: 3 COWS:      Psychiatric Specialty Exam:  Presentation  General Appearance:  Casual  Eye Contact: Fair  Speech: Clear and Coherent  Speech Volume: Normal    Mood and Affect  Mood: Euthymic  Affect: Appropriate   Thought Process  Thought Processes: Coherent  Descriptions of Associations:Intact  Orientation:Full (Time, Place and Person)  Thought Content:WDL  Hallucinations:No data recorded  Ideas of Reference:None  Suicidal Thoughts:No data recorded  Homicidal Thoughts:No data recorded   Sensorium  Memory: Immediate Fair; Recent Poor; Remote Poor  Judgment: Poor  Insight: Poor   Executive Functions  Concentration: Poor  Attention Span: Poor  Recall: [employer] of Knowledge: Fair  Language: Fair   Psychomotor Activity  Psychomotor Activity: No data recorded  Musculoskeletal: Strength & Muscle Tone: within normal limits Gait & Station: normal Assets  Assets: Communication Skills    Physical Exam: Physical Exam Vitals and nursing note reviewed.  HENT:     Head: Atraumatic.  Eyes:     Extraocular Movements: Extraocular movements intact.  Pulmonary:     Effort: Pulmonary effort is normal.  Neurological:     Mental Status: He is alert and oriented to person, place, and time.    Review of Systems  Psychiatric/Behavioral:  Negative for depression, hallucinations, substance abuse and suicidal ideas. The patient is nervous/anxious. The patient does not have insomnia.    Blood pressure 105/72, pulse 67, temperature (!) 97.2 F (36.2 C), resp. rate 16, height 5' 9 (1.753 m), weight 68.9 kg, SpO2 99%. Body mass index is 22.45 kg/m.  Diagnosis: Principal Problem:   Substance induced mood disorder (HCC)   PLAN: Safety and Monitoring:  -- Voluntary admission to inpatient psychiatric unit for safety, stabilization and treatment  -- Daily contact with patient to assess and evaluate symptoms and progress in  treatment  -- Patient's case to be discussed in multi-disciplinary team meeting  -- Observation Level : q15 minute checks  -- Vital signs:  q12 hours  -- Precautions: suicide, elopement, and assault -- Encouraged patient to participate in unit milieu and in scheduled group therapies  2. Psychiatric Diagnoses and Treatment:              Alcohol use disorder      Librium  taper completed today    CIWA protocol Plan for d/c tomorrow if does well overnight -- The risks/benefits/side-effects/alternatives to this medication were discussed in detail with the patient and time was given for questions. The patient consents to medication trial.                -- Metabolic profile and EKG monitoring obtained while on an atypical antipsychotic (BMI: Lipid Panel: HbgA1c: QTc:)              -- Encouraged  patient to participate in unit milieu and in scheduled group therapies                            3. Medical Issues Being Addressed:  Will continue to monitor for hypokalemia and hypertension.  Home medications restarted following pharmacy review will continue to hold blood pressure medication Declined neuro follow up   4. Discharge Planning:   -- plan for discharge fri/sat  -- Social work and case management to assist with discharge planning and identification of hospital follow-up needs prior to discharge  -- Estimated LOS: 3-4 days  Donnice FORBES Right, PA-C 11/14/2023, 1:49 PM

## 2023-11-14 NOTE — Plan of Care (Signed)
  Problem: Education: Goal: Emotional status will improve Outcome: Progressing Goal: Mental status will improve Outcome: Progressing   Problem: Activity: Goal: Interest or engagement in activities will improve Outcome: Progressing   Problem: Coping: Goal: Ability to verbalize frustrations and anger appropriately will improve Outcome: Progressing   Problem: Health Behavior/Discharge Planning: Goal: Compliance with treatment plan for underlying cause of condition will improve Outcome: Progressing   Problem: Physical Regulation: Goal: Ability to maintain clinical measurements within normal limits will improve Outcome: Progressing   Problem: Safety: Goal: Periods of time without injury will increase Outcome: Progressing

## 2023-11-14 NOTE — Progress Notes (Signed)
   11/13/23 2158  Psych Admission Type (Psych Patients Only)  Admission Status Involuntary  Psychosocial Assessment  Patient Complaints Anxiety  Eye Contact Fair  Facial Expression Animated  Affect Appropriate to circumstance  Speech Logical/coherent  Interaction Assertive  Motor Activity Tremors  Appearance/Hygiene Unremarkable  Behavior Characteristics Cooperative  Mood Pleasant;Preoccupied  Thought Process  Coherency Circumstantial  Content Blaming others  Delusions None reported or observed  Perception WDL  Hallucination None reported or observed  Judgment Poor  Confusion None  Danger to Self  Current suicidal ideation? Denies  Danger to Others  Danger to Others None reported or observed

## 2023-11-14 NOTE — Group Note (Signed)
 LCSW Group Therapy Note  Group Date: 11/14/2023 Start Time: 1315 End Time: 1400   Type of Therapy and Topic:  Group Therapy: Anger Cues and Responses  Participation Level:  Active   Description of Group:   In this group, patients learned how to recognize the physical, cognitive, emotional, and behavioral responses they have to anger-provoking situations.  They identified a recent time they became angry and how they reacted.  They analyzed how their reaction was possibly beneficial and how it was possibly unhelpful.  The group discussed a variety of healthier coping skills that could help with such a situation in the future.  Focus was placed on how helpful it is to recognize the underlying emotions to our anger, because working on those can lead to a more permanent solution as well as our ability to focus on the important rather than the urgent.  Therapeutic Goals: Patients will remember their last incident of anger and how they felt emotionally and physically, what their thoughts were at the time, and how they behaved. Patients will identify how their behavior at that time worked for them, as well as how it worked against them. Patients will explore possible new behaviors to use in future anger situations. Patients will learn that anger itself is normal and cannot be eliminated, and that healthier reactions can assist with resolving conflict rather than worsening situations.  Summary of Patient Progress:   Patient was present in group. He shared how feeling disrespected left to feeling angry. He provided an example of a vacation trip in [place].  He displayed poor insight.  He was receptive to feedback from other group members.   Therapeutic Modalities:   Cognitive Behavioral Therapy    JOJUAN CHAMPNEY, LCSW 11/14/2023  2:55 PM

## 2023-11-14 NOTE — Progress Notes (Signed)
   11/14/23 1200  Psych Admission Type (Psych Patients Only)  Admission Status Involuntary  Psychosocial Assessment  Patient Complaints None  Eye Contact Fair  Facial Expression Animated  Affect Appropriate to circumstance  Speech Logical/coherent;Tangential  Interaction Assertive  Motor Activity Tremors (baseline per patient)  Appearance/Hygiene Unremarkable  Behavior Characteristics Cooperative  Mood Pleasant  Thought Process  Coherency Circumstantial  Content Blaming others  Delusions None reported or observed  Perception WDL  Hallucination None reported or observed  Judgment Poor  Confusion None  Danger to Self  Current suicidal ideation? Denies  Self-Injurious Behavior No self-injurious ideation or behavior indicators observed or expressed   Danger to Others  Danger to Others None reported or observed

## 2023-11-14 NOTE — Group Note (Signed)
 Recreation Therapy Group Note   Group Topic:General Recreation  Group Date: 11/14/2023 Start Time: 1000 End Time: 1100 Facilitators: Celestia Jeoffrey BRAVO, LRT, CTRS Location: Courtyard  Group Description: [employer]. LRT and patients played games of basketball, drew with chalk, and played corn hole while outside in the courtyard while getting fresh air and sunlight. Music was being played in the background. LRT and peers conversed about different games they have played before, what they do in their free time and anything else that is on their minds. LRT encouraged pts to drink water after being outside, sweating and getting their heart rate up.  Goal Area(s) Addressed: Patient will build on frustration tolerance skills. Patients will partake in a competitive play game with peers. Patients will gain knowledge of new leisure interest/hobby.    Affect/Mood: N/A   Participation Level: Did not attend    Clinical Observations/Individualized Feedback: Patient did not attend group.   Plan: Continue to engage patient in RT group sessions 2-3x/week.   Jeoffrey BRAVO Celestia, LRT, CTRS 11/14/2023 11:37 AM

## 2023-11-14 NOTE — Plan of Care (Signed)
   Problem: Education: Goal: Knowledge of [facility] Education information/materials will improve Outcome: Progressing Goal: Emotional status will improve Outcome: Progressing Goal: Mental status will improve Outcome: Progressing Goal: Verbalization of understanding the information provided will improve Outcome: Progressing

## 2023-11-14 NOTE — [facility] Counselor (Signed)
 CSW met with the patient and discussed aftercare. Pt declined aftercare for mental health at this time.  CSW will provide patient with walk in information.  Sherryle Margo, MSW, LCSW 11/14/2023 4:19 PM

## 2023-11-15 NOTE — Group Note (Signed)
 Recreation Therapy Group Note   Group Topic:Leisure Education  Group Date: 11/15/2023 Start Time: 1300 End Time: 1400 Facilitators: Celestia Jeoffrey BRAVO, LRT, CTRS Location: Craft Room  Group Description: Leisure. Patients were given the option to choose from journaling, coloring, drawing, making origami, playing with playdoh, listening to music or singing karaoke. LRT and pts discussed the meaning of leisure, the importance of participating in leisure during their free time/when they're outside of the hospital, as well as how our leisure interests can also serve as coping skills.   Goal Area(s) Addressed:  Patient will identify a current leisure interest.  Patient will learn the definition of "leisure". Patient will practice making a positive decision. Patient will have the opportunity to try a new leisure activity. Patient will communicate with peers and LRT.    Affect/Mood: Appropriate   Participation Level: Active and Engaged   Participation Quality: Independent   Behavior: Calm and Cooperative   Speech/Thought Process: Coherent   Insight: Good   Judgement: Good   Modes of Intervention: Clarification, Education, Exploration, and Music   Patient Response to Interventions:  Attentive, Engaged, Interested , and Receptive   Education Outcome:  Acknowledges education   Clinical Observations/Individualized Feedback: Christian King was active in their participation of session activities and group discussion. Pt identified play with my dog and watch racing as things he does in his free time. Pt chose to make origami while in group.    Plan: Continue to engage patient in RT group sessions 2-3x/week.   Jeoffrey BRAVO Celestia, LRT, CTRS 11/15/2023 3:28 PM

## 2023-11-15 NOTE — Plan of Care (Signed)
   Problem: Education: Goal: Emotional status will improve Outcome: Progressing Goal: Mental status will improve Outcome: Progressing

## 2023-11-15 NOTE — Progress Notes (Signed)
   11/14/23 2230  Psych Admission Type (Psych Patients Only)  Admission Status Involuntary  Psychosocial Assessment  Patient Complaints None  Eye Contact Fair  Facial Expression Animated  Affect Appropriate to circumstance  Speech Logical/coherent  Interaction Assertive  Motor Activity Tremors  Appearance/Hygiene Unremarkable  Behavior Characteristics Cooperative  Mood Pleasant  Aggressive Behavior  Effect No apparent injury  Thought Process  Coherency Circumstantial  Content Blaming others  Delusions None reported or observed  Perception WDL  Hallucination None reported or observed  Judgment Poor  Confusion None  Danger to Self  Current suicidal ideation? Denies  Self-Injurious Behavior No self-injurious ideation or behavior indicators observed or expressed   Agreement Not to Harm Self Yes  Description of Agreement Verbal  Danger to Others  Danger to Others None reported or observed   Very talkative and social. Kurtis to lacerations on the right side of his head intact. Pt. Only complaint was mild pain to his let arm and shoulder and received very good relieve from tylenol .SABRA

## 2023-11-15 NOTE — Plan of Care (Signed)
  Problem: Education: Goal: Knowledge of  General Education information/materials will improve Outcome: Adequate for Discharge Goal: Emotional status will improve Outcome: Adequate for Discharge Goal: Mental status will improve Outcome: Adequate for Discharge Goal: Verbalization of understanding the information provided will improve Outcome: Adequate for Discharge   Problem: Activity: Goal: Interest or engagement in activities will improve Outcome: Adequate for Discharge Goal: Sleeping patterns will improve Outcome: Adequate for Discharge   Problem: Coping: Goal: Ability to verbalize frustrations and anger appropriately will improve Outcome: Adequate for Discharge Goal: Ability to demonstrate self-control will improve Outcome: Adequate for Discharge   Problem: Health Behavior/Discharge Planning: Goal: Identification of resources available to assist in meeting health care needs will improve Outcome: Adequate for Discharge Goal: Compliance with treatment plan for underlying cause of condition will improve Outcome: Adequate for Discharge   Problem: Health Behavior/Discharge Planning: Goal: Compliance with treatment plan for underlying cause of condition will improve Outcome: Adequate for Discharge   Problem: Physical Regulation: Goal: Ability to maintain clinical measurements within normal limits will improve Outcome: Adequate for Discharge   Problem: Safety: Goal: Periods of time without injury will increase Outcome: Adequate for Discharge

## 2023-11-15 NOTE — Discharge Summary (Signed)
 Physician Discharge Summary Note  Patient:  Christian King is an 53 y.o., male MRN:  [id number] DOB:  Mar 24, 1970 Patient phone:  [phone] (home)  Patient address:   [address] [place] [address],   Total time spent: 40 min Date of Admission:  11/10/2023 Date of Discharge: 11/15/23  Reason for Admission:  Mr. Ra is a 53 year old Caucasian male with a past psychiatric history of substance-induced mood disorder and severe alcohol use disorder, as well as medical comorbidities of hyperlipidemia and chronic hypokalemia. He was brought to the hospital by EMS and [facility] under involuntary commitment after expressing suicidal ideations while severely intoxicated on alcohol, and due to concerns related to a recent fall while intoxicated. He was medically cleared in the ED prior to psychiatric admission. Admission was pursued for stabilization, detoxification, and further assessment of safety risk in the context of ongoing alcohol misuse and acute psychosocial stressors.  Principal Problem: Substance induced mood disorder (HCC) Discharge Diagnoses: Principal Problem:   Substance induced mood disorder (HCC)   Past Psychiatric History: see h and p  Family Psychiatric  History: see h and p Social History:  Social History   Substance and Sexual Activity  Alcohol Use Yes   Alcohol/week: 12.0 standard drinks of alcohol   Types: 12 Cans of beer per week   Comment: daily     Social History   Substance and Sexual Activity  Drug Use No    Social History   Socioeconomic History   Marital status: Married    Spouse name: Not on file   Number of children: Not on file   Years of education: Not on file   Highest education level: 12th grade  Occupational History   Not on file  Tobacco Use   Smoking status: Every Day    Current packs/day: 0.25    Average packs/day: 0.3 packs/day for 38.5 years (9.6 ttl pk-yrs)    Types: Cigarettes    Start date: 05/05/1985   Smokeless tobacco:  Never  Vaping Use   Vaping status: Never Used  Substance and Sexual Activity   Alcohol use: Yes    Alcohol/week: 12.0 standard drinks of alcohol    Types: 12 Cans of beer per week    Comment: daily   Drug use: No   Sexual activity: Yes  Other Topics Concern   Not on file  Social History Narrative   Unemployed.  Previously a Automotive engineer   Social Drivers of Health   Financial Resource Strain: Low Risk  (11/27/2022)   Overall Financial Resource Strain (CARDIA)    Difficulty of Paying Living Expenses: Not very hard  Food Insecurity: No Food Insecurity (11/11/2023)   Hunger Vital Sign    Worried About Running Out of Food in the Last Year: Never true    Ran Out of Food in the Last Year: Never true  Transportation Needs: No Transportation Needs (11/11/2023)   PRAPARE - Administrator, Civil Service (Medical): No    Lack of Transportation (Non-Medical): No  Physical Activity: Insufficiently Active (11/27/2022)   Exercise Vital Sign    Days of Exercise per Week: 2 days    Minutes of Exercise per Session: 10 min  Stress: No Stress Concern Present (11/27/2022)   [employer] of Occupational Health - Occupational Stress Questionnaire    Feeling of Stress : Only a little  Social Connections: Unknown (07/21/2023)   Social Connection and Isolation Panel    Frequency of Communication with Friends and  Family: More than three times a week    Frequency of Social Gatherings with Friends and Family: More than three times a week    Attends Religious Services: More than 4 times per year    Active Member of Clubs or Organizations: Patient declined    Attends Banker Meetings: Patient declined    Marital Status: Married   Past Medical History:  Past Medical History:  Diagnosis Date   Alcohol abuse 07/24/2010   B12 deficiency    Blood dyscrasia    hemachromatosis   BMI 25.0-25.9,adult 01/28/2015   Bronchitis    Degenerative joint disease (DJD) of  hip    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   Depression    Essential hypertension 04/26/2010   Qualifier: Diagnosis of   By: Gwenn Grimes         GERD (gastroesophageal reflux disease)    Gout    Gout 04/26/2010   Qualifier: Diagnosis of   By: Gwenn Grimes         Hemochromatosis 10/03/2013   History of DVT (deep vein thrombosis) 05/15/2016   History of kidney stones    HLD (hyperlipidemia)    HTN (hypertension)    Hyperlipidemia 04/26/2010   Qualifier: Diagnosis of   By: Gwenn Grimes         Hypokalemia 03/17/2016   Hypothyroidism    Insomnia 02/05/2014   QT prolongation 03/20/2015   Severe alcohol use disorder (HCC) 07/21/2023   Ventricular tachycardia (HCC)    in setting of hypokalemia/ hypomagnesemia and ETOH   Vitamin B12 deficiency 04/26/2010   Qualifier: Diagnosis of   By: Gwenn Grimes         Wears glasses     Past Surgical History:  Procedure Laterality Date   TOTAL HIP ARTHROPLASTY Right    TOTAL HIP ARTHROPLASTY Left 03/16/2016   Procedure: TOTAL HIP ARTHROPLASTY;  Surgeon: Shari Sieving, MD;  Location: [facility] OR;  Service: Orthopedics;  Laterality: Left;   vascularized fibular graft     right hip for avascular necrosis   Family History:  Family History  Problem Relation Age of Onset   Hypertension Father    Diabetes Maternal Grandmother    Hypertension Paternal Grandmother    Stroke Paternal Grandmother     Hospital Course:  On admission, the patient was placed on the inpatient psychiatric unit under IVC for safety, stabilization, and treatment. He was initiated on a Librium  taper for alcohol detoxification and monitored under CIWA protocol, which he tolerated well without complications. Throughout his admission he denied persistent depressive, manic, or psychotic symptoms, he remained consistent that he was not suicidal. He consistently denied suicidal or homicidal ideation during his stay and did not exhibit any behavioral  disturbances or need for PRN medications. He was encouraged to consider treatment with gabapentin  and other psychotropic medications but declined. Home medications were reviewed and restarted as appropriate.   The patient actively engaged with staff, demonstrated future-oriented thinking, and identified his father as a reliable support. He declined outpatient psychiatric follow-up and residential substance use treatment, though resources were provided at discharge. He verbalized understanding of crisis resources including 911, 988, and presenting to the nearest emergency department should he experience decompensation. Safety and discharge planning was conducted with his father Khaliq. His father confirmed removal of firearms from the home and voiced no acute safety concerns, noting only ongoing worry about his son's alcohol use.  On the day of discharge the patient was alert, oriented, pleasant, and  cooperative on exam. He denied SI/HI/AVH, rated his depression as 0/10. He denied any ongoing symptoms of withdrawal, and declined neurology followup for baseline tremor. He was able to discuss coping strategies and safety planning. All modifiable risk factors for self-harm and harm to others had been addressed, and he was deemed stable for discharge with continuation of treatment needs in the community setting. Detailed risk assessment is complete based on clinical exam and individual risk factors and acute suicide risk is low and acute violence risk is low.     Currently, all modifiable risk of harm to self/harm to others have been addressed and patient is no longer appropriate for the acute inpatient setting and is able to continue treatment for mental health needs in the community with the supports as indicated below.  Patient is educated and verbalized understanding of discharge plan of care including medications, follow-up appointments, mental health resources and further crisis services in the community.   He is instructed to call 911 or present to the nearest emergency room should he experience any decompensation in mood, disturbance of bowel or return of suicidal/homicidal ideations.  Patient verbalizes understanding of this education and agrees to this plan of care  Physical Findings: AIMS:  , ,  ,  ,    CIWA:  CIWA-Ar Total: 3 COWS:        Psychiatric Specialty Exam:  Presentation  General Appearance:  Casual  Eye Contact: Fair  Speech: Clear and Coherent  Speech Volume: Normal    Mood and Affect  Mood: Euthymic  Affect: Congruent   Thought Process  Thought Processes: Coherent  Descriptions of Associations:Intact  Orientation:Full (Time, Place and Person)  Thought Content:Logical  Hallucinations:Hallucinations: None  Ideas of Reference:None  Suicidal Thoughts:Suicidal Thoughts: No  Homicidal Thoughts:Homicidal Thoughts: No   Sensorium  Memory: Immediate Fair; Recent Fair  Judgment: Fair  Insight: Fair   Art therapist  Concentration: Fair  Attention Span: Fair  Recall: Fair  Fund of Knowledge: Fair  Language: Fair   Psychomotor Activity  Psychomotor Activity: Psychomotor Activity: Normal  Musculoskeletal: Strength & Muscle Tone: within normal limits Gait & Station: normal Assets  Assets: Manufacturing systems engineer; Housing; Social Support   Sleep  Sleep: Sleep: Good    Physical Exam: Physical Exam Vitals and nursing note reviewed.  HENT:     Head: Atraumatic.  Eyes:     Extraocular Movements: Extraocular movements intact.  Pulmonary:     Effort: Pulmonary effort is normal.  Neurological:     Mental Status: He is alert and oriented to person, place, and time.    Review of Systems  Psychiatric/Behavioral:  Negative for depression, hallucinations, memory loss, substance abuse and suicidal ideas. The patient is not nervous/anxious and does not have insomnia.    Blood pressure 104/86, pulse (!) 122,  temperature (!) 97.2 F (36.2 C), resp. rate 16, height 5' 9 (1.753 m), weight 68.9 kg, SpO2 100%. Body mass index is 22.45 kg/m.   Social History   Tobacco Use  Smoking Status Every Day   Current packs/day: 0.25   Average packs/day: 0.3 packs/day for 38.5 years (9.6 ttl pk-yrs)   Types: Cigarettes   Start date: 05/05/1985  Smokeless Tobacco Never   Tobacco Cessation:  A prescription for an FDA-approved tobacco cessation medication was offered at discharge and the patient refused   Blood Alcohol level:  Lab Results  Component Value Date   ETH 381 [facility]) 11/10/2023   ETH >450 ([facility]) 11/10/2023    Metabolic Disorder Labs:  Lab Results  Component Value Date   HGBA1C 5.3 07/21/2023   MPG 105.41 07/21/2023   No results found for: PROLACTIN Lab Results  Component Value Date   CHOL 164 11/12/2023   TRIG 48 11/12/2023   HDL 104 11/12/2023   CHOLHDL 1.6 11/12/2023   VLDL 10 11/12/2023   LDLCALC 50 11/12/2023   LDLCALC NOT CALCULATED 07/21/2023    See Psychiatric Specialty Exam and Suicide Risk Assessment completed by Attending Physician prior to discharge.  Discharge destination:  Home  Is patient on multiple antipsychotic therapies at discharge:  No   Has Patient had three or more failed trials of antipsychotic monotherapy by history:  No  Recommended Plan for Multiple Antipsychotic Therapies: NA   Allergies as of 11/15/2023       Reactions   Amoxicillin  Hives, Itching, Rash   Turned body completely red, severe itching all over body   Azithromycin  Other (See Comments)   History of QT prolongation   Hctz [hydrochlorothiazide] Rash   Tramadol Itching        Medication List     PAUSE taking these medications      Indication  metoprolol  succinate 50 MG 24 hr tablet Wait to take this until your doctor or other care provider tells you to start again. Commonly known as: TOPROL -XL Take 1 tablet (50 mg total) by mouth daily with or immediately following a  meal.  Indication: High Blood Pressure       TAKE these medications      Indication  fenofibrate  48 MG tablet Commonly known as: TRICOR  Take 48 mg by mouth daily.  Indication: High Amount of Triglycerides in the Blood   magnesium  oxide 400 (240 Mg) MG tablet Commonly known as: MAG-OX Take 400 mg by mouth daily.  Indication: Disorder with Low Magnesium  Levels   potassium chloride  10 MEQ tablet Commonly known as: KLOR-CON  Take 10 mEq by mouth daily.  Indication: Low Amount of Potassium in the Blood   simvastatin  40 MG tablet Commonly known as: ZOCOR  Take 40 mg by mouth daily.  Indication: High Amount of Fats in the Blood        Follow-up Information     [facility] Follow up.   Why: Walk in hours are from 8AM to 5PM Monday through Friday. Contact information: [address] [place] [address] [phone]         [facility] Follow up.   Why: CSW called to schedule appointment and was informed that next appointment availability is not until January.  You can call at discharge and see if you can get seen earlier. Contact information: [address] [place]  [address] [phone]        [facility] Follow up.          [facility] Follow up.   Why: Appoitment is scheduled for 12/12/2023 at 2:20PM.  Please arrive at [facility].  Appointment is with Janna Ostwalt. Contact information: [address] [place] [address] [phone]                 Follow-up recommendations:   # It is recommended to the patient to continue psychiatric medications as prescribed, after discharge from the hospital.   # It is recommended to the patient to follow up with your outpatient psychiatric provider and PCP. # It was discussed with the patient, the impact of alcohol, drugs, tobacco have been there overall psychiatric and  medical wellbeing, and total abstinence  from substance use was recommended. # Prescriptions provided or sent directly to preferred pharmacy at discharge. Patient agreeable to plan. Given the opportunity to ask questions. Appears to feel comfortable with discharge.  # In the event of worsening symptoms, the patient is instructed to call the crisis hotline (988), 911 and or go to the nearest ED for appropriate evaluation and treatment of symptoms. To follow-up with primary care provider for other medical issues, concerns and or health care needs # Patient was discharged home as requested with a plan to follow up as noted above.      Signed: Donnice FORBES Right, PA-C 11/15/2023, 11:43 AM

## 2023-11-15 NOTE — Progress Notes (Signed)
   11/15/23 1000  Psych Admission Type (Psych Patients Only)  Admission Status Involuntary  Psychosocial Assessment  Patient Complaints None  Eye Contact Fair  Facial Expression Animated  Affect Appropriate to circumstance  Speech Logical/coherent  Interaction Assertive  Motor Activity Tremors (baseline for patient)  Appearance/Hygiene Unremarkable  Behavior Characteristics Cooperative  Mood Pleasant  Thought Process  Coherency Circumstantial  Content Blaming others  Delusions None reported or observed  Perception WDL  Hallucination None reported or observed  Judgment Poor  Confusion None  Danger to Self  Current suicidal ideation? Denies  Self-Injurious Behavior No self-injurious ideation or behavior indicators observed or expressed   Danger to Others  Danger to Others None reported or observed

## 2023-11-15 NOTE — Progress Notes (Signed)
 11/14/23 1515  Spiritual Encounters  Type of Visit Follow up  Care provided to: Patient  Referral source Chaplain assessment  Reason for visit Routine spiritual support    I provided brief follow up support to Mr Christian King after spirituality group time. Christian King wished to further process and share a meaningful story about his grandparents, two occasions of end of life experience. This was prompted by our group discussion on grief and loss. Christian King seemed to reach some new insights and deeper connection with his memory of this experiences.   Alanya Vukelich L. [place]ores HERO.Div

## 2023-11-15 NOTE — [facility] Suicide Risk Assessment (Signed)
 [facility] Discharge Suicide Risk Assessment   Principal Problem: Substance induced mood disorder (HCC) Discharge Diagnoses: Principal Problem:   Substance induced mood disorder (HCC)   Total Time spent with patient: 30 minutes  Musculoskeletal: Strength & Muscle Tone: within normal limits Gait & Station: normal Patient leans: N/A  Psychiatric Specialty Exam  Presentation  General Appearance:  Casual  Eye Contact: Fair  Speech: Clear and Coherent  Speech Volume: Normal  Handedness: Right   Mood and Affect  Mood: Euthymic  Duration of Depression Symptoms: Less than two weeks  Affect: Congruent   Thought Process  Thought Processes: Coherent  Descriptions of Associations:Intact  Orientation:Full (Time, Place and Person)  Thought Content:Logical  History of Schizophrenia/Schizoaffective disorder:No  Duration of Psychotic Symptoms:No data recorded Hallucinations:Hallucinations: None  Ideas of Reference:None  Suicidal Thoughts:Suicidal Thoughts: No  Homicidal Thoughts:Homicidal Thoughts: No   Sensorium  Memory: Immediate Fair; Recent Fair  Judgment: Fair  Insight: Fair   Art therapist  Concentration: Fair  Attention Span: Fair  Recall: [employer] of Knowledge: Fair  Language: Fair   Psychomotor Activity  Psychomotor Activity: Psychomotor Activity: Normal   Assets  Assets: Manufacturing systems engineer; Housing; Social Support   Sleep  Sleep: Sleep: Good  Estimated Sleeping Duration (Last 24 Hours): 7.50-9.25 hours  Physical Exam: Physical Exam Vitals and nursing note reviewed.  HENT:     Head: Atraumatic.  Eyes:     Extraocular Movements: Extraocular movements intact.  Pulmonary:     Effort: Pulmonary effort is normal.  Neurological:     Mental Status: He is alert and oriented to person, place, and time.     Comments: Tremor at baseline    Review of Systems  Psychiatric/Behavioral:  Negative for depression,  hallucinations, memory loss, substance abuse and suicidal ideas. The patient is nervous/anxious. The patient does not have insomnia.    Blood pressure 104/86, pulse (!) 122, temperature (!) 97.2 F (36.2 C), resp. rate 16, height 5' 9 (1.753 m), weight 68.9 kg, SpO2 100%. Body mass index is 22.45 kg/m.  Mental Status Per Nursing Assessment::   On Admission:  Self-harm behaviors  Demographic Factors:  Male and Caucasian  Loss Factors: Loss of significant relationship  Historical Factors: Impulsivity  Risk Reduction Factors:   Positive social support  Continued Clinical Symptoms:  Alcohol/Substance Abuse/Dependencies  Cognitive Features That Contribute To Risk:  None    Suicide Risk:  Minimal: No identifiable suicidal ideation.  Patients presenting with no risk factors but with morbid ruminations; may be classified as minimal risk based on the severity of the depressive symptoms   Follow-up Information     [facility] Follow up.   Why: Walk in hours are from 8AM to 5PM Monday through Friday. Contact information: [address] [place] [address] [phone]         [facility] Follow up.   Why: CSW called to schedule appointment and was informed that next appointment availability is not until January.  You can call at discharge and see if you can get seen earlier. Contact information: [address] [place]  [address] [phone]        [facility] Follow up.          [facility] Follow up.   Why: Appoitment is scheduled for 12/12/2023 at 2:20PM.  Please arrive at [facility].  Appointment is with Janna Ostwalt. Contact information: [address] [place] [address] [phone]  Plan Of Care/Follow-up recommendations:  # It is recommended to the patient to continue psychiatric medications as prescribed, after discharge from the  hospital.   # It is recommended to the patient to follow up with your outpatient psychiatric provider and PCP. # It was discussed with the patient, the impact of alcohol, drugs, tobacco have been there overall psychiatric and medical wellbeing, and total abstinence from substance use was recommended. # Prescriptions provided or sent directly to preferred pharmacy at discharge. Patient agreeable to plan. Given the opportunity to ask questions. Appears to feel comfortable with discharge.  # In the event of worsening symptoms, the patient is instructed to call the crisis hotline (988), 911 and or go to the nearest ED for appropriate evaluation and treatment of symptoms. To follow-up with primary care provider for other medical issues, concerns and or health care needs # Patient was discharged home as requested with a plan to follow up as noted above.    Donnice FORBES Right, PA-C 11/15/2023, 11:40 AM

## 2023-11-15 NOTE — Group Note (Signed)
 Recreation Therapy Group Note   Group Topic:Health and Wellness  Group Date: 11/15/2023 Start Time: 1020 End Time: 1115 Facilitators: Celestia Jeoffrey BRAVO, LRT, CTRS Location: Courtyard  Group Description: [employer]. LRT and patients played games of basketball, drew with chalk, and played corn hole while outside in the courtyard while getting fresh air and sunlight. Music was being played in the background. LRT and peers conversed about different games they have played before, what they do in their free time and anything else that is on their minds. LRT encouraged pts to drink water after being outside, sweating and getting their heart rate up.  Goal Area(s) Addressed: Patient will build on frustration tolerance skills. Patients will partake in a competitive play game with peers. Patients will gain knowledge of new leisure interest/hobby.     Affect/Mood: N/A   Participation Level: Did not attend    Clinical Observations/Individualized Feedback: Patient did not attend group.   Plan: Continue to engage patient in RT group sessions 2-3x/week.   Jeoffrey BRAVO Celestia, LRT, CTRS 11/15/2023 11:52 AM

## 2023-11-15 NOTE — Progress Notes (Signed)
  [facility] Adult Case Management Discharge Plan :  Will you be returning to the same living situation after discharge:  Yes,  pt reports that he will be returning home.  At discharge, do you have transportation home?: Yes,  CSW to assist with transportation needs.  Do you have the ability to pay for your medications: Yes,  [place] EMPLOYEE / [place] [employer]  Release of information consent forms completed and in the chart;  Patient's signature needed at discharge.  Patient to Follow up at:  Follow-up Information     [facility] Follow up.   Why: Walk in hours are from 8AM to 5PM Monday through Friday. Contact information: [address] [place] [address] [phone]         [facility] Follow up.   Why: CSW called to schedule appointment and was informed that next appointment availability is not until January.  You can call at discharge and see if you can get seen earlier. Contact information: [address] [place]  [address] [phone]        [facility] Follow up.          [facility] Follow up.   Why: Appoitment is scheduled for 12/12/2023 at 2:20PM.  Please arrive at [facility].  Appointment is with Janna Ostwalt. Contact information: [address] [place] [address] [phone]                 Next level of care provider has access to [facility] Link:no  Safety Planning and Suicide Prevention discussed: Yes,  SPE completed with the patient and patients father.      Has patient been referred to the Quitline?: Patient refused referral for treatment  Patient has been referred for addiction treatment: Yes, referral information given but appointment not made Patient declined appointment and minimized substance use.  Information on outpatient providers were provided.  (list facility).  ADDIEL MCCARDLE, LCSW 11/15/2023, 9:32 AM

## 2023-11-15 NOTE — B[facility] Group Notes (Signed)
  Spirituality Group    Group Goal: Support / Education around grief and loss     Group Description: Following introductions and group rules, group members engaged in facilitated group dialog and support around topic of loss, with particular support around experiences of loss in their lives. Group members identified types of loss (relationships / self / things) as well as patterns, circumstances, and changes that precipitate loss. Reflection invited on thoughts / feelings around loss, normalized grief responses, and recognized variety in grief experience. Group noted Worden's four tasks of grief in discussion. Group drew on Adlerian / Rogerian, narrative, MI, with Yalom's group therapy as a primary framework.    Observations: Christian King was an active participant in the group discussion. Meaningful process of memories shared over his grandparents.   Christian King HERO.Div

## 2023-11-15 NOTE — Progress Notes (Signed)
 Patient denies SI/I/AVH at this time. Discharge instructions, AVS, prescriptions, and transition record reviewed with patient. Patient agrees to comply with medication management, follow-up visit and outpatient therapy. Patient belongings returned to patient. Patient questions and concerns addressed and answered.  Patient ambulatory off unit. Patient discharged home via taxi.

## 2023-11-25 ENCOUNTER — Emergency Department (HOSPITAL_COMMUNITY)

## 2023-11-25 ENCOUNTER — Encounter (HOSPITAL_COMMUNITY): Payer: Self-pay

## 2023-11-25 ENCOUNTER — Inpatient Hospital Stay (HOSPITAL_COMMUNITY)
Admission: EM | Admit: 2023-11-25 | Discharge: 2023-12-07 | DRG: 963 | Disposition: A | Discharge status: Home / Self Care | Attending: Internal Medicine | Admitting: Internal Medicine

## 2023-11-25 ENCOUNTER — Other Ambulatory Visit: Payer: Self-pay

## 2023-11-25 DIAGNOSIS — Z880 Allergy status to penicillin: Secondary | ICD-10-CM

## 2023-11-25 DIAGNOSIS — E039 Hypothyroidism, unspecified: Secondary | ICD-10-CM | POA: Diagnosis present

## 2023-11-25 DIAGNOSIS — Z96643 Presence of artificial hip joint, bilateral: Secondary | ICD-10-CM | POA: Diagnosis not present

## 2023-11-25 DIAGNOSIS — F17210 Nicotine dependence, cigarettes, uncomplicated: Secondary | ICD-10-CM | POA: Diagnosis present

## 2023-11-25 DIAGNOSIS — I609 Nontraumatic subarachnoid hemorrhage, unspecified: Secondary | ICD-10-CM | POA: Diagnosis not present

## 2023-11-25 DIAGNOSIS — E876 Hypokalemia: Secondary | ICD-10-CM | POA: Diagnosis present

## 2023-11-25 DIAGNOSIS — R339 Retention of urine, unspecified: Secondary | ICD-10-CM | POA: Diagnosis not present

## 2023-11-25 DIAGNOSIS — M79642 Pain in left hand: Secondary | ICD-10-CM | POA: Diagnosis not present

## 2023-11-25 DIAGNOSIS — S066XAA Traumatic subarachnoid hemorrhage with loss of consciousness status unknown, initial encounter: Secondary | ICD-10-CM | POA: Diagnosis not present

## 2023-11-25 DIAGNOSIS — W010XXA Fall on same level from slipping, tripping and stumbling without subsequent striking against object, initial encounter: Secondary | ICD-10-CM | POA: Diagnosis present

## 2023-11-25 DIAGNOSIS — I10 Essential (primary) hypertension: Secondary | ICD-10-CM | POA: Diagnosis present

## 2023-11-25 DIAGNOSIS — G9341 Metabolic encephalopathy: Secondary | ICD-10-CM | POA: Diagnosis present

## 2023-11-25 DIAGNOSIS — R1312 Dysphagia, oropharyngeal phase: Secondary | ICD-10-CM | POA: Diagnosis present

## 2023-11-25 DIAGNOSIS — Z87442 Personal history of urinary calculi: Secondary | ICD-10-CM

## 2023-11-25 DIAGNOSIS — W19XXXA Unspecified fall, initial encounter: Secondary | ICD-10-CM | POA: Insufficient documentation

## 2023-11-25 DIAGNOSIS — R4781 Slurred speech: Secondary | ICD-10-CM | POA: Diagnosis not present

## 2023-11-25 DIAGNOSIS — Z833 Family history of diabetes mellitus: Secondary | ICD-10-CM

## 2023-11-25 DIAGNOSIS — Z7141 Alcohol abuse counseling and surveillance of alcoholic: Secondary | ICD-10-CM

## 2023-11-25 DIAGNOSIS — R7989 Other specified abnormal findings of blood chemistry: Secondary | ICD-10-CM | POA: Insufficient documentation

## 2023-11-25 DIAGNOSIS — E785 Hyperlipidemia, unspecified: Secondary | ICD-10-CM | POA: Diagnosis present

## 2023-11-25 DIAGNOSIS — F10239 Alcohol dependence with withdrawal, unspecified: Secondary | ICD-10-CM | POA: Diagnosis present

## 2023-11-25 DIAGNOSIS — R29810 Facial weakness: Secondary | ICD-10-CM | POA: Diagnosis present

## 2023-11-25 DIAGNOSIS — E538 Deficiency of other specified B group vitamins: Secondary | ICD-10-CM | POA: Diagnosis present

## 2023-11-25 DIAGNOSIS — S065XAA Traumatic subdural hemorrhage with loss of consciousness status unknown, initial encounter: Secondary | ICD-10-CM | POA: Insufficient documentation

## 2023-11-25 DIAGNOSIS — S061XAA Traumatic cerebral edema with loss of consciousness status unknown, initial encounter: Secondary | ICD-10-CM | POA: Diagnosis present

## 2023-11-25 DIAGNOSIS — Z881 Allergy status to other antibiotic agents status: Secondary | ICD-10-CM

## 2023-11-25 DIAGNOSIS — F10939 Alcohol use, unspecified with withdrawal, unspecified: Principal | ICD-10-CM | POA: Diagnosis present

## 2023-11-25 DIAGNOSIS — G936 Cerebral edema: Secondary | ICD-10-CM | POA: Diagnosis not present

## 2023-11-25 DIAGNOSIS — Z885 Allergy status to narcotic agent status: Secondary | ICD-10-CM

## 2023-11-25 DIAGNOSIS — Z6825 Body mass index (BMI) 25.0-25.9, adult: Secondary | ICD-10-CM

## 2023-11-25 DIAGNOSIS — Z781 Physical restraint status: Secondary | ICD-10-CM

## 2023-11-25 DIAGNOSIS — Z888 Allergy status to other drugs, medicaments and biological substances status: Secondary | ICD-10-CM

## 2023-11-25 DIAGNOSIS — M109 Gout, unspecified: Secondary | ICD-10-CM | POA: Diagnosis present

## 2023-11-25 DIAGNOSIS — T796XXA Traumatic ischemia of muscle, initial encounter: Secondary | ICD-10-CM | POA: Diagnosis present

## 2023-11-25 DIAGNOSIS — I2489 Other forms of acute ischemic heart disease: Secondary | ICD-10-CM | POA: Diagnosis present

## 2023-11-25 DIAGNOSIS — Z8249 Family history of ischemic heart disease and other diseases of the circulatory system: Secondary | ICD-10-CM

## 2023-11-25 DIAGNOSIS — M25551 Pain in right hip: Secondary | ICD-10-CM | POA: Diagnosis not present

## 2023-11-25 DIAGNOSIS — S0993XA Unspecified injury of face, initial encounter: Secondary | ICD-10-CM | POA: Diagnosis not present

## 2023-11-25 DIAGNOSIS — R296 Repeated falls: Secondary | ICD-10-CM | POA: Diagnosis present

## 2023-11-25 DIAGNOSIS — S069X9A Unspecified intracranial injury with loss of consciousness of unspecified duration, initial encounter: Secondary | ICD-10-CM

## 2023-11-25 DIAGNOSIS — I472 Ventricular tachycardia, unspecified: Secondary | ICD-10-CM | POA: Diagnosis present

## 2023-11-25 DIAGNOSIS — S2232XA Fracture of one rib, left side, initial encounter for closed fracture: Secondary | ICD-10-CM | POA: Diagnosis present

## 2023-11-25 DIAGNOSIS — R748 Abnormal levels of other serum enzymes: Secondary | ICD-10-CM | POA: Insufficient documentation

## 2023-11-25 DIAGNOSIS — Z86718 Personal history of other venous thrombosis and embolism: Secondary | ICD-10-CM

## 2023-11-25 DIAGNOSIS — Z79899 Other long term (current) drug therapy: Secondary | ICD-10-CM

## 2023-11-25 DIAGNOSIS — R471 Dysarthria and anarthria: Secondary | ICD-10-CM | POA: Diagnosis present

## 2023-11-25 DIAGNOSIS — E440 Moderate protein-calorie malnutrition: Secondary | ICD-10-CM | POA: Diagnosis present

## 2023-11-25 DIAGNOSIS — K219 Gastro-esophageal reflux disease without esophagitis: Secondary | ICD-10-CM | POA: Diagnosis present

## 2023-11-25 DIAGNOSIS — E162 Hypoglycemia, unspecified: Secondary | ICD-10-CM | POA: Diagnosis present

## 2023-11-25 LAB — COMPREHENSIVE METABOLIC PANEL WITH GFR
ALT: 23 U/L (ref 0–44)
AST: 47 U/L — ABNORMAL HIGH (ref 15–41)
Albumin: 4.1 g/dL (ref 3.5–5.0)
Alkaline Phosphatase: 63 U/L (ref 38–126)
Anion gap: 24 — ABNORMAL HIGH (ref 5–15)
BUN: 6 mg/dL (ref 6–20)
CO2: 19 mmol/L — ABNORMAL LOW (ref 22–32)
Calcium: 9.3 mg/dL (ref 8.9–10.3)
Chloride: 95 mmol/L — ABNORMAL LOW (ref 98–111)
Creatinine, Ser: 0.87 mg/dL (ref 0.61–1.24)
GFR, Estimated: >60 mL/min (ref >60)
Glucose, Bld: 71 mg/dL (ref 70–99)
Potassium: 3.4 mmol/L — ABNORMAL LOW (ref 3.5–5.1)
Sodium: 138 mmol/L (ref 135–145)
Total Bilirubin: 1 mg/dL (ref 0.0–1.2)
Total Protein: 7.1 g/dL (ref 6.5–8.1)

## 2023-11-25 LAB — APTT: aPTT: 25 s (ref 24–36)

## 2023-11-25 LAB — DIFFERENTIAL
Abs Immature Granulocytes: 0.05 K/uL (ref 0.00–0.07)
Basophils Absolute: 0.1 K/uL (ref 0.0–0.1)
Basophils Relative: 1 %
Eosinophils Absolute: 0 K/uL (ref 0.0–0.5)
Eosinophils Relative: 0 %
Immature Granulocytes: 1 %
Lymphocytes Relative: 4 %
Lymphs Abs: 0.3 K/uL — ABNORMAL LOW (ref 0.7–4.0)
Monocytes Absolute: 0.6 K/uL (ref 0.1–1.0)
Monocytes Relative: 7 %
Neutro Abs: 6.7 K/uL (ref 1.7–7.7)
Neutrophils Relative %: 87 %

## 2023-11-25 LAB — I-STAT CHEM 8, ED
BUN: 5 mg/dL — ABNORMAL LOW (ref 6–20)
Calcium, Ion: 1.13 mmol/L — ABNORMAL LOW (ref 1.15–1.40)
Chloride: 99 mmol/L (ref 98–111)
Creatinine, Ser: 0.8 mg/dL (ref 0.61–1.24)
Glucose, Bld: 71 mg/dL (ref 70–99)
HCT: 35 % — ABNORMAL LOW (ref 39.0–52.0)
Hemoglobin: 11.9 g/dL — ABNORMAL LOW (ref 13.0–17.0)
Potassium: 3.4 mmol/L — ABNORMAL LOW (ref 3.5–5.1)
Sodium: 137 mmol/L (ref 135–145)
TCO2: 19 mmol/L — ABNORMAL LOW (ref 22–32)

## 2023-11-25 LAB — ETHANOL: Alcohol, Ethyl (B): <15 mg/dL (ref <15)

## 2023-11-25 LAB — CBC
HCT: 36.1 % — ABNORMAL LOW (ref 39.0–52.0)
Hemoglobin: 12.2 g/dL — ABNORMAL LOW (ref 13.0–17.0)
MCH: 36.1 pg — ABNORMAL HIGH (ref 26.0–34.0)
MCHC: 33.8 g/dL (ref 30.0–36.0)
MCV: 106.8 fL — ABNORMAL HIGH (ref 80.0–100.0)
Platelets: 277 K/uL (ref 150–400)
RBC: 3.38 MIL/uL — ABNORMAL LOW (ref 4.22–5.81)
RDW: 13.6 % (ref 11.5–15.5)
WBC: 7.7 K/uL (ref 4.0–10.5)
nRBC: 0 % (ref 0.0–0.2)

## 2023-11-25 LAB — TROPONIN T, HIGH SENSITIVITY
Troponin T High Sensitivity: 34 ng/L — ABNORMAL HIGH (ref 0–19)
Troponin T High Sensitivity: 31 ng/L — ABNORMAL HIGH (ref 0–19)

## 2023-11-25 LAB — AMMONIA: Ammonia: <13 umol/L (ref 9–35)

## 2023-11-25 LAB — CK: Total CK: 902 U/L — ABNORMAL HIGH (ref 49–397)

## 2023-11-25 LAB — PROTIME-INR
INR: 0.9 (ref 0.8–1.2)
Prothrombin Time: 13 s (ref 11.4–15.2)

## 2023-11-25 LAB — MAGNESIUM: Magnesium: 1.1 mg/dL — ABNORMAL LOW (ref 1.7–2.4)

## 2023-11-25 MED ORDER — FOLIC ACID 1 MG PO TABS
1.0000 mg | ORAL_TABLET | Freq: Every day | ORAL | Status: DC
  Administered 2023-11-27 – 2023-12-04 (×7): 1 mg via ORAL
  Filled 2023-11-25 (×9): qty 1

## 2023-11-25 MED ORDER — SODIUM CHLORIDE 0.9 % IV BOLUS
1000.0000 mL | Freq: Once | INTRAVENOUS | Status: AC
  Administered 2023-11-26: 1000 mL via INTRAVENOUS

## 2023-11-25 MED ORDER — PHENOBARBITAL SODIUM 130 MG/ML IJ SOLN
260.0000 mg | Freq: Once | INTRAMUSCULAR | Status: AC
  Administered 2023-11-25: 260 mg via INTRAVENOUS
  Filled 2023-11-25: qty 2

## 2023-11-25 MED ORDER — PHENOBARBITAL SODIUM 130 MG/ML IJ SOLN
INTRAMUSCULAR | Status: AC
  Filled 2023-11-25: qty 2

## 2023-11-25 MED ORDER — THIAMINE HCL 100 MG/ML IJ SOLN
INTRAMUSCULAR | Status: AC
  Filled 2023-11-25: qty 6

## 2023-11-25 MED ORDER — ADULT MULTIVITAMIN W/MINERALS CH
1.0000 | ORAL_TABLET | Freq: Every day | ORAL | Status: DC
  Administered 2023-11-27 – 2023-12-04 (×7): 1 via ORAL
  Filled 2023-11-25 (×9): qty 1

## 2023-11-25 MED ORDER — PHENOBARBITAL SODIUM 130 MG/ML IJ SOLN
390.0000 mg | Freq: Once | INTRAMUSCULAR | Status: DC
  Filled 2023-11-25: qty 3

## 2023-11-25 MED ORDER — SODIUM CHLORIDE 0.9 % IV SOLN: INTRAVENOUS | Status: AC

## 2023-11-25 MED ORDER — THIAMINE HCL 100 MG/ML IJ SOLN
100.0000 mg | Freq: Every day | INTRAMUSCULAR | Status: DC
  Administered 2023-11-25: 100 mg via INTRAVENOUS
  Filled 2023-11-25: qty 2

## 2023-11-25 MED ORDER — THIAMINE HCL 100 MG/ML IJ SOLN
500.0000 mg | INTRAVENOUS | Status: AC
  Administered 2023-11-26: 500 mg via INTRAVENOUS
  Filled 2023-11-25: qty 5

## 2023-11-25 MED ORDER — SODIUM CHLORIDE 0.9 % IV BOLUS: 500.0000 mL | Freq: Once | INTRAVENOUS | Status: DC

## 2023-11-25 MED ORDER — POTASSIUM CHLORIDE 10 MEQ/100ML IV SOLN
10.0000 meq | INTRAVENOUS | Status: AC
  Administered 2023-11-25 (×2): 10 meq via INTRAVENOUS
  Filled 2023-11-25 (×2): qty 100

## 2023-11-25 MED ORDER — THIAMINE MONONITRATE 100 MG PO TABS: 100.0000 mg | ORAL_TABLET | Freq: Every day | ORAL | Status: DC

## 2023-11-25 MED ORDER — DEXTROSE 5 % IV SOLN
700.0000 mg | Freq: Three times a day (TID) | INTRAVENOUS | Status: DC
  Administered 2023-11-26 – 2023-11-27 (×4): 700 mg via INTRAVENOUS
  Filled 2023-11-25 (×7): qty 14

## 2023-11-25 MED ORDER — PHENOBARBITAL SODIUM 130 MG/ML IJ SOLN
130.0000 mg | Freq: Once | INTRAMUSCULAR | Status: AC
  Administered 2023-11-26: 130 mg via INTRAVENOUS
  Filled 2023-11-25: qty 1

## 2023-11-25 MED ORDER — PHENOBARBITAL SODIUM 130 MG/ML IJ SOLN: 440.0000 mg | Freq: Once | INTRAMUSCULAR | Status: DC

## 2023-11-25 MED ORDER — MAGNESIUM SULFATE 2 GM/50ML IV SOLN
2.0000 g | Freq: Once | INTRAVENOUS | Status: AC
  Administered 2023-11-25: 2 g via INTRAVENOUS
  Filled 2023-11-25: qty 50

## 2023-11-25 MED ORDER — IOHEXOL 350 MG/ML SOLN
75.0000 mL | Freq: Once | INTRAVENOUS | Status: AC | PRN
  Administered 2023-11-25: 75 mL via INTRAVENOUS

## 2023-11-25 MED ORDER — LIDOCAINE HCL (PF) 1 % IJ SOLN
5.0000 mL | Freq: Once | INTRAMUSCULAR | Status: AC
  Administered 2023-11-26: 5 mL
  Filled 2023-11-25: qty 5

## 2023-11-25 NOTE — BH Assessment (Signed)
 Patient was deferred to IRIS for a telepsych assessment. The assigned care coordinator will provide updates regarding the scheduling of the assessment. IRIS coordinator can be reached at [phone] for further information on the timing of the telepsych evaluation.

## 2023-11-25 NOTE — ED Provider Notes (Signed)
 [place] EMERGENCY DEPARTMENT AT [facility] Provider Note   CSN: [id number] Arrival date & time: 11/25/23  2001     Patient presents with: Christian King is a 53 y.o. male.  {Add pertinent medical, surgical, social history, OB history to HPI:3267} 53 year old male alcohol abuse, DVT, QT prolongation and ventricular tachycardia who presents emergency department facial droop and falls.  History obtained per the patient and his aunt.  They report that over a week ago he had a fall where he hit his head.  She noticed that on Thursday of this week he also had left-sided facial droop and was drooling quite a bit from his mouth.  She has noticed that he has some difficulty speaking and appears to be confused as well.  Says that he is having pain on the right side of his ribs after the fall.  Last rank was last night at 1 AM.  Per family drinks 1/4 gallon of vodka per day per day       Prior to Admission medications   Medication Sig Start Date End Date Taking? Authorizing Provider  fenofibrate  (TRICOR ) 48 MG tablet Take 48 mg by mouth daily.    [provider]  magnesium  oxide (MAG-OX) 400 (240 Mg) MG tablet Take 400 mg by mouth daily.    [provider]  metoprolol  succinate (TOPROL -XL) 50 MG 24 hr tablet Take 1 tablet (50 mg total) by mouth daily with or immediately following a meal. 11/20/22   Gladis, Mary-Margaret, FNP  potassium chloride  (KLOR-CON ) 10 MEQ tablet Take 10 mEq by mouth daily.    [provider]  simvastatin  (ZOCOR ) 40 MG tablet Take 40 mg by mouth daily.    [provider]    Allergies: Amoxicillin , Azithromycin , Hctz [hydrochlorothiazide], and Tramadol    Review of Systems  Updated Vital Signs BP 113/74 (BP Location: Right Arm)   Pulse 94   Temp 98.6 F (37 C) (Oral)   Resp 16   Ht 5' 9 (1.753 m)   Wt 68.9 kg   SpO2 100%   BMI 22.43 kg/m   Physical Exam Vitals and nursing note reviewed.   Constitutional:      General: He is not in acute distress.    Appearance: He is well-developed.  HENT:     Head: Normocephalic.     Comments: Healed laceration to left eyebrow    Right Ear: External ear normal.     Left Ear: External ear normal.     Nose: Nose normal.  Eyes:     Extraocular Movements: Extraocular movements intact.     Conjunctiva/sclera: Conjunctivae normal.     Pupils: Pupils are equal, round, and reactive to light.     Comments: Pupils 5 mm bilaterally  Neck:     Comments: No midline tenderness to palpation  Cardiovascular:     Rate and Rhythm: Normal rate and regular rhythm.     Heart sounds: Normal heart sounds.     Comments: Bruising on right chest wall.  Tenderness palpation of left chest wall Pulmonary:     Effort: Pulmonary effort is normal. No respiratory distress.     Breath sounds: Normal breath sounds.  Abdominal:     General: There is no distension.     Palpations: Abdomen is soft. There is no mass.     Tenderness: There is no abdominal tenderness. There is no guarding.  Musculoskeletal:     Cervical back: Normal range of motion and  neck supple.     Right lower leg: No edema.     Left lower leg: No edema.     Comments: Tenderness palpation of right hip.  No tenderness palpation of bilateral shoulders, elbows, or wrist.  No tenderness palpation of bilateral knees or ankles  Skin:    General: Skin is warm and dry.  Neurological:     Mental Status: He is alert.     Comments: NIHSS Exam  Level of Consciousness: Alert  LOC Questions: Answers Month and Age Correctly  LOC Commands: Opens and Closes Eyes and Hands on command  Best Gaze: Horizontal ocular movements intact  Visual Fields: No visual field loss  Facial Palsy: Left facial palsy that does not involve the forehead L Upper Extremity Motor: No drift after 10 seconds  R Upper Extremity Motor: No drift after 10 seconds  L Lower extremity Motor: No drift after 5 seconds  R Lower extremity  Motor: No drift after 5 seconds  Ataxia: Absent  Sensory: Intact sensation to light touch on face, arms, trunk, and legs bilaterally  Best Language: Difficulty naming objects Dysarthria: Mild dysarthria present Neglect: No visual or sensory neglect   Tremor and tongue fasciculation noted   Psychiatric:        Mood and Affect: Mood normal.        Behavior: Behavior normal.     (all labs ordered are listed, but only abnormal results are displayed) Labs Reviewed  CBC  DIFFERENTIAL  COMPREHENSIVE METABOLIC PANEL WITH GFR  URINE DRUG SCREEN  CK  I-STAT CHEM 8, ED  TROPONIN T, HIGH SENSITIVITY    EKG: None  Radiology: No results found.  {Document cardiac monitor, telemetry assessment procedure when appropriate:32947} Procedures   Medications Ordered in the ED - No data to display    {Click here for ABCD2, HEART and other calculators REFRESH Note before signing:1}                              Medical Decision Making Amount and/or Complexity of Data Reviewed Labs: ordered. Radiology: ordered.  Risk OTC drugs. Prescription drug management.   53 year old male alcohol abuse, DVT, QT prolongation and ventricular tachycardia who presents emergency department facial droop and falls.   Initial Ddx:  Stroke, cranial nerve palsy, Bell's palsy, alcohol intoxication, alcohol withdrawals, C-spine injury, rib fracture, hip fracture  MDM/Course:  *** Upon re-evaluation ***  This patient presents to the ED for concern of complaints listed in HPI, this involves an extensive number of treatment options, and is a complaint that carries with it a high risk of complications and morbidity. Disposition including potential need for admission considered.   Dispo: {Disposition:28069}  Additional history obtained from {Additional History:28067} Records reviewed {Records Reviewed:28068} The following labs were independently interpreted: {labs interpreted:28064} and show {lab  findings:28250} I independently reviewed the following imaging with scope of interpretation limited to determining acute life threatening conditions related to emergency care: {imaging interpreted:28065} and agree with the radiologist interpretation with the following exceptions: none I personally reviewed and interpreted cardiac monitoring: {cardiac monitoring:28251} I personally reviewed and interpreted the pt's EKG: see above for interpretation  I have reviewed the patients home medications and made adjustments as needed Consults: {Consultants:28063} Social Determinants of health:  ***  Portions of this note were generated with Scientist, clinical (histocompatibility and immunogenetics). Dictation errors may occur despite best attempts at proofreading.     Final diagnoses:  None    ED  Discharge Orders     None

## 2023-11-25 NOTE — ED Triage Notes (Signed)
 Pt to ED from home with c/o left sided facial droop and only being able to drink from right side of mouth and drooling out of other side. Pt has also been getting choked on liquids, pt also has slurred speech, pt bib Aunt, pt lives alone. Pt Aunt says pt has had multiple falls, Aunt reports all of these symptoms started Wednesday and have progressively gotten worse. Aunt says she could not get pt to come get checked out.

## 2023-11-26 ENCOUNTER — Inpatient Hospital Stay (HOSPITAL_COMMUNITY)

## 2023-11-26 DIAGNOSIS — R7989 Other specified abnormal findings of blood chemistry: Secondary | ICD-10-CM | POA: Diagnosis not present

## 2023-11-26 DIAGNOSIS — G936 Cerebral edema: Secondary | ICD-10-CM | POA: Diagnosis not present

## 2023-11-26 DIAGNOSIS — S2232XA Fracture of one rib, left side, initial encounter for closed fracture: Secondary | ICD-10-CM | POA: Diagnosis not present

## 2023-11-26 DIAGNOSIS — Z8249 Family history of ischemic heart disease and other diseases of the circulatory system: Secondary | ICD-10-CM | POA: Diagnosis not present

## 2023-11-26 DIAGNOSIS — M79642 Pain in left hand: Secondary | ICD-10-CM | POA: Diagnosis not present

## 2023-11-26 DIAGNOSIS — I10 Essential (primary) hypertension: Secondary | ICD-10-CM | POA: Diagnosis present

## 2023-11-26 DIAGNOSIS — R748 Abnormal levels of other serum enzymes: Secondary | ICD-10-CM | POA: Diagnosis not present

## 2023-11-26 DIAGNOSIS — R0602 Shortness of breath: Secondary | ICD-10-CM | POA: Diagnosis not present

## 2023-11-26 DIAGNOSIS — Y92009 Unspecified place in unspecified non-institutional (private) residence as the place of occurrence of the external cause: Secondary | ICD-10-CM

## 2023-11-26 DIAGNOSIS — I609 Nontraumatic subarachnoid hemorrhage, unspecified: Secondary | ICD-10-CM

## 2023-11-26 DIAGNOSIS — R29810 Facial weakness: Secondary | ICD-10-CM | POA: Diagnosis not present

## 2023-11-26 DIAGNOSIS — F10939 Alcohol use, unspecified with withdrawal, unspecified: Secondary | ICD-10-CM | POA: Diagnosis not present

## 2023-11-26 DIAGNOSIS — S066X0A Traumatic subarachnoid hemorrhage without loss of consciousness, initial encounter: Secondary | ICD-10-CM | POA: Diagnosis not present

## 2023-11-26 DIAGNOSIS — I2489 Other forms of acute ischemic heart disease: Secondary | ICD-10-CM | POA: Diagnosis not present

## 2023-11-26 DIAGNOSIS — S065XAA Traumatic subdural hemorrhage with loss of consciousness status unknown, initial encounter: Secondary | ICD-10-CM | POA: Diagnosis not present

## 2023-11-26 DIAGNOSIS — T796XXA Traumatic ischemia of muscle, initial encounter: Secondary | ICD-10-CM | POA: Diagnosis not present

## 2023-11-26 DIAGNOSIS — E876 Hypokalemia: Secondary | ICD-10-CM

## 2023-11-26 DIAGNOSIS — K219 Gastro-esophageal reflux disease without esophagitis: Secondary | ICD-10-CM | POA: Diagnosis present

## 2023-11-26 DIAGNOSIS — F10930 Alcohol use, unspecified with withdrawal, uncomplicated: Secondary | ICD-10-CM | POA: Diagnosis not present

## 2023-11-26 DIAGNOSIS — S061XAA Traumatic cerebral edema with loss of consciousness status unknown, initial encounter: Secondary | ICD-10-CM | POA: Diagnosis not present

## 2023-11-26 DIAGNOSIS — R296 Repeated falls: Secondary | ICD-10-CM | POA: Diagnosis present

## 2023-11-26 DIAGNOSIS — F10931 Alcohol use, unspecified with withdrawal delirium: Secondary | ICD-10-CM

## 2023-11-26 DIAGNOSIS — S066XAA Traumatic subarachnoid hemorrhage with loss of consciousness status unknown, initial encounter: Secondary | ICD-10-CM | POA: Diagnosis not present

## 2023-11-26 DIAGNOSIS — R1312 Dysphagia, oropharyngeal phase: Secondary | ICD-10-CM | POA: Diagnosis not present

## 2023-11-26 DIAGNOSIS — W010XXA Fall on same level from slipping, tripping and stumbling without subsequent striking against object, initial encounter: Secondary | ICD-10-CM | POA: Diagnosis present

## 2023-11-26 DIAGNOSIS — S0993XA Unspecified injury of face, initial encounter: Secondary | ICD-10-CM | POA: Diagnosis not present

## 2023-11-26 DIAGNOSIS — F10239 Alcohol dependence with withdrawal, unspecified: Secondary | ICD-10-CM | POA: Diagnosis present

## 2023-11-26 DIAGNOSIS — S2242XA Multiple fractures of ribs, left side, initial encounter for closed fracture: Secondary | ICD-10-CM | POA: Diagnosis not present

## 2023-11-26 DIAGNOSIS — E785 Hyperlipidemia, unspecified: Secondary | ICD-10-CM | POA: Diagnosis present

## 2023-11-26 DIAGNOSIS — E039 Hypothyroidism, unspecified: Secondary | ICD-10-CM | POA: Diagnosis present

## 2023-11-26 DIAGNOSIS — G9341 Metabolic encephalopathy: Secondary | ICD-10-CM | POA: Diagnosis not present

## 2023-11-26 DIAGNOSIS — I472 Ventricular tachycardia, unspecified: Secondary | ICD-10-CM | POA: Diagnosis not present

## 2023-11-26 DIAGNOSIS — R569 Unspecified convulsions: Secondary | ICD-10-CM | POA: Diagnosis not present

## 2023-11-26 DIAGNOSIS — R471 Dysarthria and anarthria: Secondary | ICD-10-CM | POA: Diagnosis present

## 2023-11-26 DIAGNOSIS — M25551 Pain in right hip: Secondary | ICD-10-CM | POA: Diagnosis not present

## 2023-11-26 DIAGNOSIS — W19XXXA Unspecified fall, initial encounter: Secondary | ICD-10-CM | POA: Insufficient documentation

## 2023-11-26 DIAGNOSIS — Z833 Family history of diabetes mellitus: Secondary | ICD-10-CM | POA: Diagnosis not present

## 2023-11-26 DIAGNOSIS — M7989 Other specified soft tissue disorders: Secondary | ICD-10-CM | POA: Diagnosis not present

## 2023-11-26 DIAGNOSIS — Z4682 Encounter for fitting and adjustment of non-vascular catheter: Secondary | ICD-10-CM | POA: Diagnosis not present

## 2023-11-26 DIAGNOSIS — Z96643 Presence of artificial hip joint, bilateral: Secondary | ICD-10-CM | POA: Diagnosis not present

## 2023-11-26 DIAGNOSIS — I6200 Nontraumatic subdural hemorrhage, unspecified: Secondary | ICD-10-CM | POA: Diagnosis not present

## 2023-11-26 DIAGNOSIS — F17210 Nicotine dependence, cigarettes, uncomplicated: Secondary | ICD-10-CM | POA: Diagnosis present

## 2023-11-26 DIAGNOSIS — Z6825 Body mass index (BMI) 25.0-25.9, adult: Secondary | ICD-10-CM | POA: Diagnosis not present

## 2023-11-26 DIAGNOSIS — E440 Moderate protein-calorie malnutrition: Secondary | ICD-10-CM | POA: Diagnosis not present

## 2023-11-26 LAB — CBG MONITORING, ED
Glucose-Capillary: 61 mg/dL — ABNORMAL LOW (ref 70–99)
Glucose-Capillary: 174 mg/dL — ABNORMAL HIGH (ref 70–99)
Glucose-Capillary: 136 mg/dL — ABNORMAL HIGH (ref 70–99)
Glucose-Capillary: 90 mg/dL (ref 70–99)
Glucose-Capillary: 85 mg/dL (ref 70–99)

## 2023-11-26 LAB — COMPREHENSIVE METABOLIC PANEL WITH GFR
ALT: 16 U/L (ref 0–44)
AST: 33 U/L (ref 15–41)
Albumin: 3.1 g/dL — ABNORMAL LOW (ref 3.5–5.0)
Alkaline Phosphatase: 47 U/L (ref 38–126)
Anion gap: 20 — ABNORMAL HIGH (ref 5–15)
BUN: <5 mg/dL — ABNORMAL LOW (ref 6–20)
CO2: 18 mmol/L — ABNORMAL LOW (ref 22–32)
Calcium: 8 mg/dL — ABNORMAL LOW (ref 8.9–10.3)
Chloride: 99 mmol/L (ref 98–111)
Creatinine, Ser: 0.67 mg/dL (ref 0.61–1.24)
GFR, Estimated: >60 mL/min (ref >60)
Glucose, Bld: 73 mg/dL (ref 70–99)
Potassium: 3.1 mmol/L — ABNORMAL LOW (ref 3.5–5.1)
Sodium: 136 mmol/L (ref 135–145)
Total Bilirubin: 0.7 mg/dL (ref 0.0–1.2)
Total Protein: 5.5 g/dL — ABNORMAL LOW (ref 6.5–8.1)

## 2023-11-26 LAB — CBC
HCT: 31.1 % — ABNORMAL LOW (ref 39.0–52.0)
Hemoglobin: 10.1 g/dL — ABNORMAL LOW (ref 13.0–17.0)
MCH: 35.1 pg — ABNORMAL HIGH (ref 26.0–34.0)
MCHC: 32.5 g/dL (ref 30.0–36.0)
MCV: 108 fL — ABNORMAL HIGH (ref 80.0–100.0)
Platelets: 205 K/uL (ref 150–400)
RBC: 2.88 MIL/uL — ABNORMAL LOW (ref 4.22–5.81)
RDW: 13.5 % (ref 11.5–15.5)
WBC: 6.1 K/uL (ref 4.0–10.5)
nRBC: 0 % (ref 0.0–0.2)

## 2023-11-26 LAB — MAGNESIUM: Magnesium: 1.6 mg/dL — ABNORMAL LOW (ref 1.7–2.4)

## 2023-11-26 LAB — AMMONIA: Ammonia: 36 umol/L — ABNORMAL HIGH (ref 9–35)

## 2023-11-26 LAB — URINE DRUG SCREEN
Amphetamines: NEGATIVE
Barbiturates: POSITIVE — AB
Benzodiazepines: POSITIVE — AB
Cocaine: NEGATIVE
Fentanyl: NEGATIVE
Methadone Scn, Ur: NEGATIVE
Opiates: NEGATIVE
Tetrahydrocannabinol: NEGATIVE

## 2023-11-26 LAB — PHOSPHORUS: Phosphorus: 2.3 mg/dL — ABNORMAL LOW (ref 2.5–4.6)

## 2023-11-26 MED ORDER — LORAZEPAM 2 MG/ML IJ SOLN
1.0000 mg | INTRAMUSCULAR | Status: DC | PRN
  Administered 2023-11-26 – 2023-11-29 (×5): 1 mg via INTRAVENOUS
  Filled 2023-11-26 (×5): qty 1

## 2023-11-26 MED ORDER — GADOBUTROL 1 MMOL/ML IV SOLN
7.0000 mL | Freq: Once | INTRAVENOUS | Status: AC | PRN
  Administered 2023-11-26: 7 mL via INTRAVENOUS

## 2023-11-26 MED ORDER — LACTATED RINGERS IV SOLN: INTRAVENOUS | Status: DC

## 2023-11-26 MED ORDER — PHENOBARBITAL SODIUM 65 MG/ML IJ SOLN
32.4000 mg | Freq: Three times a day (TID) | INTRAMUSCULAR | Status: AC
  Administered 2023-11-28 – 2023-11-30 (×6): 32.4 mg via INTRAVENOUS
  Filled 2023-11-26 (×6): qty 1

## 2023-11-26 MED ORDER — PHENOBARBITAL 32.4 MG PO TABS: 32.4000 mg | ORAL_TABLET | Freq: Three times a day (TID) | ORAL | Status: DC

## 2023-11-26 MED ORDER — PHENOBARBITAL SODIUM 130 MG/ML IJ SOLN
INTRAMUSCULAR | Status: AC
  Filled 2023-11-26: qty 1

## 2023-11-26 MED ORDER — ONDANSETRON HCL 4 MG/2ML IJ SOLN
4.0000 mg | Freq: Four times a day (QID) | INTRAMUSCULAR | Status: DC | PRN
  Administered 2023-12-03: 4 mg via INTRAVENOUS
  Filled 2023-11-26: qty 2

## 2023-11-26 MED ORDER — LIDOCAINE 5 % EX PTCH
1.0000 | MEDICATED_PATCH | CUTANEOUS | Status: DC
  Administered 2023-11-27 – 2023-12-05 (×9): 1 via TRANSDERMAL
  Filled 2023-11-26 (×10): qty 1

## 2023-11-26 MED ORDER — SCOPOLAMINE 1 MG/3DAYS TD PT72
1.0000 | MEDICATED_PATCH | TRANSDERMAL | Status: DC
  Administered 2023-11-26 – 2023-12-05 (×5): 1 mg via TRANSDERMAL
  Filled 2023-11-26 (×5): qty 1

## 2023-11-26 MED ORDER — DEXTROSE 50 % IV SOLN
50.0000 mL | Freq: Once | INTRAVENOUS | Status: AC
  Administered 2023-11-26: 50 mL via INTRAVENOUS
  Filled 2023-11-26: qty 50

## 2023-11-26 MED ORDER — PHENOBARBITAL SODIUM 65 MG/ML IJ SOLN
64.8000 mg | Freq: Three times a day (TID) | INTRAMUSCULAR | Status: AC
  Administered 2023-11-26 – 2023-11-28 (×6): 64.8 mg via INTRAVENOUS
  Filled 2023-11-26 (×5): qty 1

## 2023-11-26 MED ORDER — ONDANSETRON HCL 4 MG PO TABS: 4.0000 mg | ORAL_TABLET | Freq: Four times a day (QID) | ORAL | Status: DC | PRN

## 2023-11-26 MED ORDER — PHENOBARBITAL 32.4 MG PO TABS: 64.8000 mg | ORAL_TABLET | Freq: Three times a day (TID) | ORAL | Status: DC

## 2023-11-26 MED ORDER — ACETAMINOPHEN 650 MG RE SUPP: 650.0000 mg | Freq: Four times a day (QID) | RECTAL | Status: DC | PRN

## 2023-11-26 MED ORDER — ACETAMINOPHEN 325 MG PO TABS
650.0000 mg | ORAL_TABLET | Freq: Four times a day (QID) | ORAL | Status: DC | PRN
  Administered 2023-11-29 – 2023-11-30 (×2): 650 mg via ORAL
  Filled 2023-11-26 (×3): qty 2

## 2023-11-26 MED ORDER — MAGNESIUM SULFATE IN D5W 1-5 GM/100ML-% IV SOLN
1.0000 g | Freq: Once | INTRAVENOUS | Status: AC
  Administered 2023-11-26: 1 g via INTRAVENOUS
  Filled 2023-11-26: qty 100

## 2023-11-26 MED ORDER — HYDROMORPHONE HCL 1 MG/ML IJ SOLN
0.5000 mg | Freq: Three times a day (TID) | INTRAMUSCULAR | Status: DC | PRN
  Administered 2023-11-26 – 2023-12-03 (×4): 0.5 mg via INTRAVENOUS
  Filled 2023-11-26 (×4): qty 0.5

## 2023-11-26 MED ORDER — MAGNESIUM SULFATE 2 GM/50ML IV SOLN
2.0000 g | Freq: Once | INTRAVENOUS | Status: AC
  Administered 2023-11-26: 2 g via INTRAVENOUS
  Filled 2023-11-26: qty 50

## 2023-11-26 NOTE — Progress Notes (Signed)
 Patient ID: Christian King, male   DOB: 13-Sep-1970, 53 y.o.   MRN: [id number] Films reviewed. The tiny bright spot called a subarachnoid is clearly non aneurysmal. I am not in agreement that this fluid collection represents a subdural hematoma, looks more like a hygroma. Nonetheless operative evacuation is not indicated. Basal cisterns are widely patent. Minimal midline shift. Agree with the MRI. Afebrile not at all clear this is hsv. No neurosurgical issues identified at this time.

## 2023-11-26 NOTE — Progress Notes (Signed)
 Pharmacy Antibiotic Note  Christian King is a 53 y.o. male admitted on 11/25/2023 with AMS, possible encephalitis.  Pharmacy has been consulted for Acyclovir dosing.  Plan: Acyclovir 700 mg IV q8h  Height: 5' 9 (175.3 cm) Weight: 68.9 kg (151 lb 14.4 oz) IBW/kg (Calculated) : 70.7  Temp (24hrs), Avg:98.6 F (37 C), Min:98.6 F (37 C), Max:98.6 F (37 C)  Recent Labs  Lab 11/25/23 2044 11/25/23 2110  WBC 7.7  --   CREATININE 0.87 0.80    Estimated Creatinine Clearance: 101.7 mL/min (by C-G formula based on SCr of 0.8 mg/dL).    Allergies  Allergen Reactions   Amoxicillin  Hives, Itching and Rash    Turned body completely red, severe itching all over body   Azithromycin  Other (See Comments)    History of QT prolongation   Hctz [Hydrochlorothiazide] Rash   Tramadol Itching     Dail Christian King 11/26/2023 2:37 AM

## 2023-11-26 NOTE — Plan of Care (Signed)
 Briefly, Mr. Christian King is a 53 y.o. male with chronic EtOH use who presents to the ED with a L facial droop today and falls over the last couple days. Last drink was at 0100 on 11/25/23.  CT head with new edema in the anterior left temporal lobe along with small amount of subarachnoid hemorrhage within a single sulcus of the right frontal lobe and two sulci of the left frontal lobe. These were not noted on CT head on 11/10/23.  He is awake, slightly confused and ?aphasic, except for L facial droop, no focal deficits noted.  Recs: - LP with CSF cell count differential, protein, glucose, meningitis/encephalitis panel, HSV PCR CSF, CSF gram stain and cultures. - Start acyclovir. - For noted hypoglycemia on chemistry, recommend empiric thiamine  along with dextrose  after Thiamine  levels are collected. - Get Pt/INR prior to attempting LP. - MRI Brain with and without contrast. - admit to [facility], neurology consult on arrival. - Load with Keppra x 1, in case the confusion/aphasia is secondary to focal seizure.  Plan discussed with Dr. Yolande with the ED team.  Teletha Petrea [facility]

## 2023-11-26 NOTE — Progress Notes (Signed)
 Progress Note   Patient: Christian King FMW:[id number] DOB: 12-Jun-1970 DOA: 11/25/2023     0 DOS: the patient was seen and examined on 11/26/2023   Brief hospital admission narrative: As per H&P written by Dr. Manfred on 11/26/2023 Christian King is a 53 y.o. male with medical history significant of severe alcohol use disorder, hyperlipidemia who presented to the emergency department due to fall and left facial droop.  At bedside, patient was laying in bed and was unable to provide a history.  History was obtained from EDP and ED medical record.  Per report, patient had a fall about a week ago and he hit his head.  On Thursday 10/2, he developed left-sided facial droop.  Patient's aunt noted patient having some confusion and speech difficulty.  Last alcohol intake was 1 AM on 10//25, it was reported that patient drinks 1/4 gallon of vodka daily.   ED Course:  In the emergency department, she was hemodynamically stable.  Workup in the ED showed WBC 7.7, hemoglobin 12.2, mitoguazone 0.1, MCV 106.8, platelets 277.  BMP was normal except for potassium of 3.4, chloride 95, bicarb 19, anion gap 24, troponin 34 > 31, alcohol level was less than 15, urine drug screen was positive for benzodiazepine and barbiturates CT chest without contrast showed left 10th rib fracture without complicating factors CT angiography of head and neck with and without contrast showed: 1. Small amount of subarachnoid hemorrhage within a single sulcus of the right frontal lobe and two sulci of the left frontal lobe. 2. New edema in the anterior left temporal lobe since 11/10/23. MRI with and without contrast recommended for further characterization. 3. 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 4. 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 5. No emergent large vessel occlusion or high grade stenosis. CT cervical spine showed no  acute abnormality CT of the face without contrast showed no acute abnormality Neurologist (Dr. Vanessa) was consulted and recommended further studies, MRI brain with and without contrast (please refer to neurologist consult note) and to admit patient to [facility] for further neurology evaluation.  Assessment and plan 1-alcohol withdrawal syndrome with complication/acute metabolic encephalopathy. - Continue phenobarbital taper - Continue Ativan  per CIWA protocol - Continue thiamine , folic acid  and multivitamins. - Alcohol cessation counseling provided. - TOC consult requested for outpatient resources to help with quitting process.  2-Cerebral edema - New edema in the anterior left temporal lobe (new since November 10, 2023). - Follow MRI - Neurology service consulted with recommendations for LP in order to rule out the presence of meningitis - Empirically started on acyclovir and Keppra. - Continue supportive care and follow clinical response.  3-hypokalemia/hypomagnesemia - Replete electrolytes and follow trend - Continue telemetry monitoring.  4-fall - Patient with closed fracture of 1 rib on left side - Continue as needed analgesics - Evaluation by PT/OT has been requested.  5-subarachnoid hemorrhage/subdural hematoma - EDP consulted with neurosurgery (Dr. LITTIE) based on his impression and imaging analysis reported that the bright spot seen on CT was deemed to be nonaneurysmal and there is no operative evacuation indicated. - Findings suggesting subdural hematoma was most likely hygroma in nature; in agreement for MRI. - Follow-up findings and clinical response.  6-elevated CK level - Following mechanical fall, most likely complaining of rhabdomyolysis - Continue fluid resuscitation - Follow clinical response  7-mild flat troponin elevation - EKG without acute ischemic changes - Most likely  type II demand ischemia - Patient denies chest pain.  8-increase upper airway  secretion - Scopolamine patch has been added - Speech evaluation requested  Subjective:  Afebrile, no chest pain, no nausea, no vomiting.  In no major distress.  Patient was somnolent, able to answer simple questions intermittently.  Per wife's report increase upper airway secretions and demonstrated inability to clear them effectively on exam.  Physical Exam: Vitals:   11/26/23 0600 11/26/23 0615 11/26/23 0630 11/26/23 0725  BP: 116/61 109/61 118/66   Pulse: 80 73 87   Resp:   14   Temp:    98.2 F (36.8 C)  TempSrc:    Oral  SpO2: 92% 96% 96%   Weight:      Height:       General exam: Alert, awake, oriented x 1; intermittently answering questions adequately.  No acute distress. Respiratory system: Good saturation on room air; positive rhonchi appreciated bilaterally.  No wheezing, no crackles, no using accessory muscle. Cardiovascular system:RRR. No rubs, gallops or murmurs; no JVD. Gastrointestinal system: Abdomen is nondistended, soft and nontender. No organomegaly or masses felt. Normal bowel sounds heard. Central nervous system: Moving 4 limbs spontaneously.  No focal neurological deficits.  Exam is limited secondary to current mentation. Extremities: No cyanosis, clubbing or edema. Skin: No rashes, lesions or ulcers Psychiatry: Judgement and insight appear impaired currently; limited examination.  No suicidal ideation or hallucination.   Data Reviewed: Magnesium : 1.6 - Phosphorus: 2.3 - UDS: Positive benzodiazepine and barbiturates (both given in the ED). Thiamine : Pending CBC: White blood cell 6.1, hemoglobin 10.1 and platelet count 205K Comprehensive metabolic panel: Sodium 136, potassium 3.1, chloride 99, bicarb 18, BUN less than 5, creatinine 0.67, normal LFTs and GFR >60  Family Communication: Wife at bedside.  Disposition: Status is: Inpatient Remains inpatient appropriate because: IV therapy.  Anticipating discharge back home once medically stable.  Time  spent: 50 minutes  Author: Eric Nunnery, MD 11/26/2023 7:59 AM  For on call review [email or web address].

## 2023-11-26 NOTE — Progress Notes (Signed)
 Patient admitted for EtOH withdrawal and fall with bilateral subarachnoid hemorrhages, bilateral subdural hematomas, stable 3 mm midline shift on MRI today.   Case reviewed in preparation for planned transfer to [facility] from [facility].   In preparation for the case and in light of the most recent imaging, spoke with my attending, Dr. Landy. He spoke with the radiologist who read the MRI (Dr. Jama Hurst). There is no indication of HSV on MRI. Based on risk, LP not recommended at this time. Dr. Jama Hurst is happy to discuss more with clinical team if needed.

## 2023-11-26 NOTE — ED Notes (Signed)
 Per patients wife please contact her first and if she does not answer call Erminio Rummer next. Patients wife requested his father NOT be called. Patients wife went home.

## 2023-11-26 NOTE — ED Provider Notes (Signed)
 NP attempted to assess patient, secure chatted Christian King (paramedic), awaiting for response - will follow-up when patient is able to participate in assessment.

## 2023-11-26 NOTE — H&P (Signed)
 History and Physical    Patient: Christian King FMW:[id number] DOB: 1970/03/20 DOA: 11/25/2023 DOS: the patient was seen and examined on 11/26/2023 PCP: Patient, No Pcp Per  Patient coming from: Home  Chief Complaint:  Chief Complaint  Patient presents with   Fall   HPI: Christian King is a 53 y.o. male with medical history significant of severe alcohol use disorder, hyperlipidemia who presented to the emergency department due to fall and left facial droop.  At bedside, patient was laying in bed and was unable to provide a history.  History was obtained from EDP and ED medical record.  Per report, patient had a fall about a week ago and he hit his head.  On Thursday 10/2, he developed left-sided facial droop.  Patient's aunt noted patient having some confusion and speech difficulty.  Last alcohol intake was 1 AM on 10//25, it was reported that patient drinks 1/4 gallon of vodka daily.   ED Course:  In the emergency department, she was hemodynamically stable.  Workup in the ED showed WBC 7.7, hemoglobin 12.2, mitoguazone 0.1, MCV 106.8, platelets 277.  BMP was normal except for potassium of 3.4, chloride 95, bicarb 19, anion gap 24, troponin 34 > 31, alcohol level was less than 15, urine drug screen was positive for benzodiazepine and barbiturates CT chest without contrast showed left 10th rib fracture without complicating factors CT angiography of head and neck with and without contrast showed: 1. Small amount of subarachnoid hemorrhage within a single sulcus of the right frontal lobe and two sulci of the left frontal lobe. 2. New edema in the anterior left temporal lobe since 11/10/23. MRI with and without contrast recommended for further characterization. 3. 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 4. 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 5. No emergent large vessel occlusion or high  grade stenosis. CT cervical spine showed no acute abnormality CT of the face without contrast showed no acute abnormality Neurologist (Dr. Vanessa) was consulted and recommended further studies, MRI brain with and without contrast (please refer to neurologist consult note) and to admit patient to [facility] for further neurology evaluation.   Review of Systems: Review of systems as noted in the HPI. All other systems reviewed and are negative.   Past Medical History:  Diagnosis Date   Alcohol abuse 07/24/2010   B12 deficiency    Blood dyscrasia    hemachromatosis   BMI 25.0-25.9,adult 01/28/2015   Bronchitis    Degenerative joint disease (DJD) of hip    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   Depression    Essential hypertension 04/26/2010   Qualifier: Diagnosis of   By: Gwenn Grimes         GERD (gastroesophageal reflux disease)    Gout    Gout 04/26/2010   Qualifier: Diagnosis of   By: Gwenn Grimes         Hemochromatosis 10/03/2013   History of DVT (deep vein thrombosis) 05/15/2016   History of kidney stones    HLD (hyperlipidemia)    HTN (hypertension)    Hyperlipidemia 04/26/2010   Qualifier: Diagnosis of   By: Gwenn Grimes         Hypokalemia 03/17/2016   Hypothyroidism    Insomnia 02/05/2014   QT prolongation 03/20/2015   Severe alcohol use disorder (HCC) 07/21/2023   Ventricular tachycardia (HCC)    in setting of hypokalemia/ hypomagnesemia and ETOH  Vitamin B12 deficiency 04/26/2010   Qualifier: Diagnosis of   By: Gwenn Grimes         Wears glasses    Past Surgical History:  Procedure Laterality Date   TOTAL HIP ARTHROPLASTY Right    TOTAL HIP ARTHROPLASTY Left 03/16/2016   Procedure: TOTAL HIP ARTHROPLASTY;  Surgeon: Shari Sieving, MD;  Location: [facility] OR;  Service: Orthopedics;  Laterality: Left;   vascularized fibular graft     right hip for avascular necrosis    Social History:  reports that he has been smoking  cigarettes. He started smoking about 38 years ago. He has a 9.6 pack-year smoking history. He has never used smokeless tobacco. He reports current alcohol use of about 12.0 standard drinks of alcohol per week. He reports that he does not use drugs.   Allergies  Allergen Reactions   Amoxicillin  Hives, Itching and Rash    Turned body completely red, severe itching all over body   Azithromycin  Other (See Comments)    History of QT prolongation   Hctz [Hydrochlorothiazide] Rash   Tramadol Itching    Family History  Problem Relation Age of Onset   Hypertension Father    Diabetes Maternal Grandmother    Hypertension Paternal Grandmother    Stroke Paternal Grandmother      Prior to Admission medications   Medication Sig Start Date End Date Taking? Authorizing Provider  fenofibrate  (TRICOR ) 48 MG tablet Take 48 mg by mouth daily.    [provider]  magnesium  oxide (MAG-OX) 400 (240 Mg) MG tablet Take 400 mg by mouth daily.    [provider]  metoprolol  succinate (TOPROL -XL) 50 MG 24 hr tablet Take 1 tablet (50 mg total) by mouth daily with or immediately following a meal. 11/20/22   Gladis, Mary-Margaret, FNP  potassium chloride  (KLOR-CON ) 10 MEQ tablet Take 10 mEq by mouth daily.    [provider]  simvastatin  (ZOCOR ) 40 MG tablet Take 40 mg by mouth daily.    [provider]    Physical Exam: BP 115/63   Pulse 89   Temp 98.6 F (37 C) (Oral)   Resp 14   Ht 5' 9 (1.753 m)   Wt 68.9 kg   SpO2 96%   BMI 22.43 kg/m   General: 53 y.o. year-old male well developed well nourished in no acute distress.  Alert and oriented x3. HEENT: NCAT, noted healed laceration of left eyebrow. Neck: Supple, trachea medial Cardiovascular: Regular rate and rhythm with no rubs or gallops.  No thyromegaly or JVD noted.  No lower extremity edema. 2/4 pulses in all 4 extremities. Respiratory: Clear to auscultation with no wheezes or rales. Good inspiratory  effort. Abdomen: Soft, nontender nondistended with normal bowel sounds x4 quadrants. Muskuloskeletal: Tender to palpation of left chest wall.  No cyanosis, clubbing or edema noted bilaterally Neuro: CN II-XII intact, strength 5/5 x 4, positive dysarthria, positive tremor: Sensation, reflexes intact Skin: No ulcerative lesions noted or rashes Psychiatry: Mood is appropriate for condition and setting          Labs on Admission:  Basic Metabolic Panel: Recent Labs  Lab 11/25/23 2044 11/25/23 2053 11/25/23 2110  NA 138  --  137  K 3.4*  --  3.4*  CL 95*  --  99  CO2 19*  --   --   GLUCOSE 71  --  71  BUN 6  --  5*  CREATININE 0.87  --  0.80  CALCIUM  9.3  --   --  MG  --  1.1*  --    Liver Function Tests: Recent Labs  Lab 11/25/23 2044  AST 47*  ALT 23  ALKPHOS 63  BILITOT 1.0  PROT 7.1  ALBUMIN 4.1   No results for input(s): LIPASE, AMYLASE in the last 168 hours. Recent Labs  Lab 11/25/23 2105  AMMONIA <13   CBC: Recent Labs  Lab 11/25/23 2044 11/25/23 2110  WBC 7.7  --   NEUTROABS 6.7  --   HGB 12.2* 11.9*  HCT 36.1* 35.0*  MCV 106.8*  --   PLT 277  --    Cardiac Enzymes: Recent Labs  Lab 11/25/23 2044  CKTOTAL 902*    BNP (last 3 results) No results for input(s): BNP in the last 8760 hours.  ProBNP (last 3 results) No results for input(s): PROBNP in the last 8760 hours.  CBG: No results for input(s): GLUCAP in the last 168 hours.  Radiological Exams on Admission: CT Maxillofacial Wo Contrast Result Date: 11/25/2023 EXAM: CT OF THE FACE WITHOUT CONTRAST 11/25/2023 10:15:03 PM TECHNIQUE: CT of the face was performed without the administration of intravenous contrast. Multiplanar reformatted images are provided for review. Automated exposure control, iterative reconstruction, and/or weight based adjustment of the mA/kV was utilized to reduce the radiation dose to as low as reasonably achievable. COMPARISON: None available. CLINICAL  HISTORY: Facial trauma. Complains of left sided facial droop and only being able to drink from right side of mouth and drooling out of other side. Patient has also been getting choked on liquids, has slurred speech, and lives alone. Patient's aunt reports multiple falls and that all of these symptoms started Wednesday and have progressively gotten worse. Aunt says she could not get patient to come get checked out. FINDINGS: FACIAL BONES: No acute facial fracture. No mandibular dislocation. No suspicious bone lesion. ORBITS: Globes are intact. No acute traumatic injury. No inflammatory change. SINUSES AND MASTOIDS: No acute abnormality. SOFT TISSUES: No acute abnormality. IMPRESSION: 1. No acute facial fracture. Electronically signed by: Franky Stanford MD 11/25/2023 10:44 PM EDT RP Workstation: [id number]   CT C-SPINE NO CHARGE Result Date: 11/25/2023 EXAM: CT CERVICAL SPINE WITHOUT CONTRAST 11/25/2023 10:15:03 PM TECHNIQUE: CT of the cervical spine was performed without the administration of intravenous contrast. Multiplanar reformatted images are provided for review. Automated exposure control, iterative reconstruction, and/or weight based adjustment of the mA/kV was utilized to reduce the radiation dose to as low as reasonably achievable. COMPARISON: None available. CLINICAL HISTORY: c/o left sided facial droop and only being able to drink from right side of mouth and drooling out of other side. Pt has also been getting choked on liquids, pt also has slurred speech, pt bib Aunt, pt lives alone. Pt Aunt says pt has had multiple falls, Aunt reports all of these symptoms started Wednesday and have progressively gotten worse. Aunt says she could not get pt to come get checked out. FINDINGS: BONES AND ALIGNMENT: No acute fracture or traumatic malalignment. DEGENERATIVE CHANGES: No significant degenerative changes. SOFT TISSUES: No prevertebral soft tissue swelling. IMPRESSION: 1. No acute abnormality of the  cervical spine. Electronically signed by: Franky Stanford MD 11/25/2023 10:42 PM EDT RP Workstation: [id number]   CT ANGIO HEAD NECK W WO CM Result Date: 11/25/2023 EXAM: CTA Head and Neck with and without Intravenous Contrast. CT Head without Contrast. CLINICAL HISTORY: Acute neuro deficit, stroke suspected. Symptoms include left-sided facial droop, difficulty speaking (slurred speech), difficulty swallowing liquids (choking), and drooling. Symptoms started Wednesday and have progressively worsened.  History of multiple falls. TECHNIQUE: Axial CTA images of the head and neck performed with and without intravenous contrast. MIP reconstructed images were created and reviewed. Axial computed tomography images of the head/brain performed without intravenous contrast. Note: Per PQRS, the description of internal carotid artery percent stenosis, including 0 percent or normal exam, is based on [place] American Symptomatic Carotid Endarterectomy Trial (NASCET) criteria. Dose reduction technique was used including one or more of the following: automated exposure control, adjustment of mA and kV according to patient size, and/or iterative reconstruction. CONTRAST: With and without; 75 mL iohexol  (OMNIPAQUE ) 350 MG/ML injection. COMPARISON: 11/10/2023 FINDINGS: CT HEAD: BRAIN: There is a small amount of subarachnoid hemorrhage within a single sulcus of the right frontal lobe and 2 sulci of the left frontal lobe. Edema in the anterior left temporal lobe is new since 11/10/2023. 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 No acute intraparenchymal hemorrhage. No mass lesion. No CT evidence for acute territorial infarct. No midline shift. VENTRICLES: No hydrocephalus. ORBITS: The orbits are unremarkable. SINUSES AND MASTOIDS: The paranasal sinuses and mastoid air  cells are clear. CTA NECK: COMMON CAROTID ARTERIES: No significant stenosis. No dissection or occlusion. INTERNAL CAROTID ARTERIES: No stenosis by NASCET criteria. No dissection or occlusion. VERTEBRAL ARTERIES: No significant stenosis. No dissection or occlusion. CTA HEAD: ANTERIOR CEREBRAL ARTERIES: No significant stenosis. No occlusion. No aneurysm. MIDDLE CEREBRAL ARTERIES: No significant stenosis. No occlusion. No aneurysm. POSTERIOR CEREBRAL ARTERIES: No significant stenosis. No occlusion. No aneurysm. BASILAR ARTERY: No significant stenosis. No occlusion. No aneurysm. OTHER: SOFT TISSUES: No acute finding. No masses or lymphadenopathy. BONES: No acute osseous abnormality. IMPRESSION: 1. Small amount of subarachnoid hemorrhage within a single sulcus of the right frontal lobe and two sulci of the left frontal lobe. 2. New edema in the anterior left temporal lobe since 11/10/23. MRI with and without contrast recommended for further characterization. 3. 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 4. 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 5. No emergent large vessel occlusion or high grade stenosis. Findings communicated to Dr. Yolande at 10:41pm on 11/25/2023. Electronically signed by: Franky Stanford MD 11/25/2023 10:41 PM EDT RP Workstation: [id number]   CT Chest Wo Contrast Result Date: 11/25/2023 CLINICAL DATA:  Recent fall with chest pain, initial encounter EXAM: CT CHEST WITHOUT CONTRAST TECHNIQUE: Multidetector CT imaging of the chest was performed following the standard protocol without IV contrast. RADIATION DOSE REDUCTION: This exam was performed according to the departmental dose-optimization program which includes automated exposure control, adjustment of the mA and/or kV according to patient size and/or use of iterative reconstruction technique. COMPARISON:  11/10/2023 FINDINGS: Cardiovascular: Somewhat  limited due to lack of IV contrast. No aneurysmal dilatation is seen. Atherosclerotic calcifications are noted. No pericardial effusion is seen. Mild coronary calcifications are noted. Mediastinum/Nodes: Thoracic inlet is within normal limits. No hilar or mediastinal adenopathy is noted. The esophagus as visualized is within normal limits. Lungs/Pleura: Lungs are well aerated bilaterally. No focal infiltrate or sizable effusion is seen. No pneumothorax is noted. Upper Abdomen: Cholelithiasis without complicating factors. Musculoskeletal: Left tenth rib fracture is noted posteriorly. No complicating factors are seen. IMPRESSION: Left tenth rib fracture without complicating factors. No other acute abnormality is noted. Aortic Atherosclerosis (ICD10-I70.0). Electronically Signed   By: Oneil  Lukens M.D.   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 Pelvic ring appears intact. Contrast is noted within the renal collecting systems. No acute fracture or dislocation is noted. IMPRESSION: Postsurgical changes without acute abnormality. Electronically Signed   By: Oneil Devonshire M.D.   On: 11/25/2023 22:19   DG Hand Complete Left Result Date: 11/25/2023 CLINICAL DATA:  Left hand pain, no known injury EXAM: LEFT HAND - COMPLETE 3+ VIEW COMPARISON:  None Available. FINDINGS: There is no evidence of fracture or dislocation. There is no evidence of arthropathy or other focal bone abnormality. Soft tissues are unremarkable. IMPRESSION: No acute abnormality noted. Electronically Signed   By: Oneil Devonshire M.D.   On: 11/25/2023 22:18    EKG: I independently viewed the EKG done and my findings are as followed: Atrial fibrillation with RVR  Assessment/Plan Present on Admission:  Alcohol  withdrawal (HCC)  Cerebral edema (HCC)  Hypokalemia  Principal Problem:   Alcohol withdrawal (HCC) Active Problems:   Cerebral edema (HCC)   Hypokalemia   Falls, initial encounter   Subarachnoid hemorrhage (HCC)   Subdural hematoma (HCC)   Hypomagnesemia   Elevated CK   Elevated troponin  Alcohol withdrawal syndrome with complication Alcohol level was < 15 Continue phenobarbital taper, Ativan , thiamine , folic acid , and multivitamin Continue fall precaution and neuro checks Patient will be counseled on alcohol withdrawal cessation when more stable  Cerebral edema CT of head and neck showed new edema in the anterior left temporal lobe since 11/10/23 MRI brain with and without contrast pending 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 LP with CSF cell count differential, protein, glucose, meningitis/encephalitis panel, HSV PCR CSF, CSF gram stain and cultures by IR in the morning Neurology was consulted and recommended admitting the patient to [facility] with plan for further neurology evaluation.  Please consult neurology team when patient [facility] to [facility] initial encounter Closed fracture of 1 rib on the left side, initial encounter Continue fall precaution Continue Tylenol  as needed Continue PT/OT eval and treat  Subarachnoid hemorrhage Neurosurgery Retta, Rockey) was consulted and the tiny bright spots seen on CT was deemed to be nonaneurysmal and there is no operative evacuation indicated.  Subdural hematoma This was thought to be a hygroma and not subdural hematoma per neurosurgery  Hypokalemia K+ 3.4, this was replenished  Hypomagnesemia Mg level is 1.1 This will be replenished Please continue to monitor Mg level and correct accordingly  Elevated CK Total CK 902 Continue IV hydration  Elevated troponin possibly due to type II demand ischemia Troponin 34 >> 31.   DVT prophylaxis: SCDs  Code  Status: Full code  Family Communication: None at bedside  Consults: Neurology (Dr. Vanessa) by [facility] EDP  Severity of Illness: The appropriate patient status for this patient is INPATIENT. Inpatient status is judged to be reasonable and necessary in order to provide the required intensity of service to ensure the patient's safety. The patient's presenting symptoms, physical exam findings, and initial radiographic and laboratory data in the context of their chronic comorbidities is felt to place them at high risk for further clinical deterioration. Furthermore, it is not anticipated that the patient will be medically stable for discharge from the  hospital within 2 midnights of admission.   * I certify that at the point of admission it is my clinical judgment that the patient will require inpatient hospital care spanning beyond 2 midnights from the point of admission due to high intensity of service, high risk for further deterioration and high frequency of surveillance required.*  Author: Amarrah Meinhart, DO 11/26/2023 4:09 AM  For on call review [email or web address].

## 2023-11-27 ENCOUNTER — Inpatient Hospital Stay (HOSPITAL_COMMUNITY)

## 2023-11-27 ENCOUNTER — Ambulatory Visit (HOSPITAL_COMMUNITY)

## 2023-11-27 DIAGNOSIS — R569 Unspecified convulsions: Secondary | ICD-10-CM

## 2023-11-27 DIAGNOSIS — E876 Hypokalemia: Secondary | ICD-10-CM | POA: Diagnosis not present

## 2023-11-27 DIAGNOSIS — S2232XA Fracture of one rib, left side, initial encounter for closed fracture: Secondary | ICD-10-CM

## 2023-11-27 DIAGNOSIS — F10939 Alcohol use, unspecified with withdrawal, unspecified: Secondary | ICD-10-CM | POA: Diagnosis not present

## 2023-11-27 DIAGNOSIS — S066XAA Traumatic subarachnoid hemorrhage with loss of consciousness status unknown, initial encounter: Principal | ICD-10-CM

## 2023-11-27 DIAGNOSIS — I609 Nontraumatic subarachnoid hemorrhage, unspecified: Secondary | ICD-10-CM

## 2023-11-27 DIAGNOSIS — S061XAA Traumatic cerebral edema with loss of consciousness status unknown, initial encounter: Secondary | ICD-10-CM

## 2023-11-27 LAB — MAGNESIUM
Magnesium: 1.6 mg/dL — ABNORMAL LOW (ref 1.7–2.4)
Magnesium: 1.4 mg/dL — ABNORMAL LOW (ref 1.7–2.4)

## 2023-11-27 LAB — COMPREHENSIVE METABOLIC PANEL WITH GFR
ALT: 14 U/L (ref 0–44)
AST: 20 U/L (ref 15–41)
Albumin: 2.3 g/dL — ABNORMAL LOW (ref 3.5–5.0)
Alkaline Phosphatase: 40 U/L (ref 38–126)
Anion gap: 13 (ref 5–15)
BUN: <5 mg/dL — ABNORMAL LOW (ref 6–20)
CO2: 23 mmol/L (ref 22–32)
Calcium: 7.6 mg/dL — ABNORMAL LOW (ref 8.9–10.3)
Chloride: 98 mmol/L (ref 98–111)
Creatinine, Ser: 0.7 mg/dL (ref 0.61–1.24)
GFR, Estimated: >60 mL/min (ref >60)
Glucose, Bld: 163 mg/dL — ABNORMAL HIGH (ref 70–99)
Potassium: 3.2 mmol/L — ABNORMAL LOW (ref 3.5–5.1)
Sodium: 134 mmol/L — ABNORMAL LOW (ref 135–145)
Total Bilirubin: 1.1 mg/dL (ref 0.0–1.2)
Total Protein: 5.4 g/dL — ABNORMAL LOW (ref 6.5–8.1)

## 2023-11-27 LAB — GLUCOSE, CAPILLARY
Glucose-Capillary: 59 mg/dL — ABNORMAL LOW (ref 70–99)
Glucose-Capillary: 165 mg/dL — ABNORMAL HIGH (ref 70–99)
Glucose-Capillary: 116 mg/dL — ABNORMAL HIGH (ref 70–99)
Glucose-Capillary: 71 mg/dL (ref 70–99)
Glucose-Capillary: 82 mg/dL (ref 70–99)
Glucose-Capillary: 83 mg/dL (ref 70–99)
Glucose-Capillary: 113 mg/dL — ABNORMAL HIGH (ref 70–99)
Glucose-Capillary: 115 mg/dL — ABNORMAL HIGH (ref 70–99)

## 2023-11-27 LAB — PHOSPHORUS: Phosphorus: 1.9 mg/dL — ABNORMAL LOW (ref 2.5–4.6)

## 2023-11-27 LAB — CK: Total CK: 395 U/L (ref 49–397)

## 2023-11-27 LAB — BASIC METABOLIC PANEL WITH GFR
Anion gap: 11 (ref 5–15)
BUN: <5 mg/dL — ABNORMAL LOW (ref 6–20)
CO2: 24 mmol/L (ref 22–32)
Calcium: 7.7 mg/dL — ABNORMAL LOW (ref 8.9–10.3)
Chloride: 97 mmol/L — ABNORMAL LOW (ref 98–111)
Creatinine, Ser: 0.64 mg/dL (ref 0.61–1.24)
GFR, Estimated: >60 mL/min (ref >60)
Glucose, Bld: 82 mg/dL (ref 70–99)
Potassium: 2.9 mmol/L — ABNORMAL LOW (ref 3.5–5.1)
Sodium: 132 mmol/L — ABNORMAL LOW (ref 135–145)

## 2023-11-27 LAB — CBC
HCT: 34.4 % — ABNORMAL LOW (ref 39.0–52.0)
Hemoglobin: 11.4 g/dL — ABNORMAL LOW (ref 13.0–17.0)
MCH: 35 pg — ABNORMAL HIGH (ref 26.0–34.0)
MCHC: 33.1 g/dL (ref 30.0–36.0)
MCV: 105.5 fL — ABNORMAL HIGH (ref 80.0–100.0)
Platelets: 181 K/uL (ref 150–400)
RBC: 3.26 MIL/uL — ABNORMAL LOW (ref 4.22–5.81)
RDW: 13.3 % (ref 11.5–15.5)
WBC: 6.6 K/uL (ref 4.0–10.5)
nRBC: 0 % (ref 0.0–0.2)

## 2023-11-27 MED ORDER — DEXTROSE 50 % IV SOLN
1.0000 | Freq: Once | INTRAVENOUS | Status: AC
  Administered 2023-11-27: 50 mL via INTRAVENOUS
  Filled 2023-11-27: qty 50

## 2023-11-27 MED ORDER — THIAMINE HCL 100 MG/ML IJ SOLN
100.0000 mg | Freq: Every day | INTRAMUSCULAR | Status: DC
  Administered 2023-12-02 – 2023-12-06 (×5): 100 mg via INTRAVENOUS
  Filled 2023-11-27 (×5): qty 2

## 2023-11-27 MED ORDER — TAMSULOSIN HCL 0.4 MG PO CAPS
0.4000 mg | ORAL_CAPSULE | Freq: Every day | ORAL | Status: DC
  Administered 2023-11-27 – 2023-12-06 (×7): 0.4 mg via ORAL
  Filled 2023-11-27 (×9): qty 1

## 2023-11-27 MED ORDER — DEXTROSE 50 % IV SOLN: 1.0000 | INTRAVENOUS | Status: DC | PRN

## 2023-11-27 MED ORDER — SODIUM CHLORIDE 0.9 % IV SOLN: INTRAVENOUS | Status: DC

## 2023-11-27 MED ORDER — THIAMINE HCL 100 MG/ML IJ SOLN
500.0000 mg | Freq: Every day | INTRAVENOUS | Status: AC
  Administered 2023-11-27 – 2023-12-01 (×5): 500 mg via INTRAVENOUS
  Filled 2023-11-27: qty 5
  Filled 2023-11-27: qty 500
  Filled 2023-11-27 (×3): qty 5

## 2023-11-27 MED ORDER — POTASSIUM CHLORIDE 10 MEQ/100ML IV SOLN
10.0000 meq | INTRAVENOUS | Status: AC
  Administered 2023-11-27 (×4): 10 meq via INTRAVENOUS
  Filled 2023-11-27 (×2): qty 100

## 2023-11-27 MED ORDER — POTASSIUM PHOSPHATES 15 MMOLE/5ML IV SOLN
30.0000 mmol | Freq: Once | INTRAVENOUS | Status: AC
  Administered 2023-11-27: 30 mmol via INTRAVENOUS
  Filled 2023-11-27: qty 10

## 2023-11-27 MED ORDER — MAGNESIUM SULFATE 4 GM/100ML IV SOLN
4.0000 g | Freq: Once | INTRAVENOUS | Status: AC
  Administered 2023-11-27: 4 g via INTRAVENOUS
  Filled 2023-11-27: qty 100

## 2023-11-27 NOTE — Consult Note (Signed)
 NEUROLOGY CONSULT NOTE   Date of service: November 27, 2023 Patient Name: Christian King MRN:  [id number] DOB:  February 09, 1971 Chief Complaint: EtOh use, temporal hypodensity on CT Head Requesting Provider: Ricky Fines, MD  History of Present Illness  Christian King is a 53 y.o. male with hx of chronic EtOh use, GERD, gout, HLD who presented to [facility] ED after fall along with a L facial droop. CT head with  with new edema in the anterior left temporal lobe along with small amount of subarachnoid hemorrhage within a single sulcus of the right frontal lobe and two sulci of the left frontal lobe. These were not noted on CT head on 11/10/23. Patient was therefore started on acyclovir and failed bedside LP. MRI brain obtained with and without contrast is most concerning for multifocal trauma rather than encephalitis.  Neurology was consulted. He is noted to have recurrent hypoglycemia and started on thiamine  along with dextrose  repalcement.  Patient reports that he lives with 2 dogs, wife recently left. He drinks 6-12 beers only on Fridays and Saturdays. He was walking to the kitchen and had metal dog food bowls in his hands and he tripped and fell and the bowls hit him in his face. Neighbor jeff found him in the basement and he called EMS. He was brought in to [facility]. Reports fall prior to this was in April of this year and also in feb of this year.  He works as a [employer] and he reports staying away from alcohol as he can lose his license over this. He eats a good mix of meat, vegetables and dairy. He does not like going to doctors. Reports both hip replaced due to hx of avascular necrosis, this was several years ago.   ROS  Comprehensive ROS performed and pertinent positives documented in HPI   Past History   Past Medical History:  Diagnosis Date   Alcohol abuse 07/24/2010   B12 deficiency    Blood dyscrasia    hemachromatosis   BMI 25.0-25.9,adult 01/28/2015   Bronchitis     Degenerative joint disease (DJD) of hip    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   Depression    Essential hypertension 04/26/2010   Qualifier: Diagnosis of   By: Gwenn Grimes         GERD (gastroesophageal reflux disease)    Gout    Gout 04/26/2010   Qualifier: Diagnosis of   By: Gwenn Grimes         Hemochromatosis 10/03/2013   History of DVT (deep vein thrombosis) 05/15/2016   History of kidney stones    HLD (hyperlipidemia)    HTN (hypertension)    Hyperlipidemia 04/26/2010   Qualifier: Diagnosis of   By: Gwenn Grimes         Hypokalemia 03/17/2016   Hypothyroidism    Insomnia 02/05/2014   QT prolongation 03/20/2015   Severe alcohol use disorder (HCC) 07/21/2023   Ventricular tachycardia (HCC)    in setting of hypokalemia/ hypomagnesemia and ETOH   Vitamin B12 deficiency 04/26/2010   Qualifier: Diagnosis of   By: Gwenn Grimes         Wears glasses     Past Surgical History:  Procedure Laterality Date   TOTAL HIP ARTHROPLASTY Right    TOTAL HIP ARTHROPLASTY Left 03/16/2016   Procedure: TOTAL HIP ARTHROPLASTY;  Surgeon: Shari Sieving, MD;  Location: [facility] OR;  Service: Orthopedics;  Laterality: Left;   vascularized fibular graft  right hip for avascular necrosis    Family History: Family History  Problem Relation Age of Onset   Hypertension Father    Diabetes Maternal Grandmother    Hypertension Paternal Grandmother    Stroke Paternal Grandmother     Social History  reports that he has been smoking cigarettes. He started smoking about 38 years ago. He has a 9.6 pack-year smoking history. He has never used smokeless tobacco. He reports current alcohol use of about 12.0 standard drinks of alcohol per week. He reports that he does not use drugs.  Allergies  Allergen Reactions   Amoxicillin  Hives, Itching and Rash    Turned body completely red, severe itching all over body   Azithromycin  Other (See Comments)    History of QT  prolongation   Hctz [Hydrochlorothiazide] Rash   Tramadol Itching    Medications   Current Facility-Administered Medications:    0.9 %  sodium chloride  infusion, , Intravenous, Continuous, Ricky Fines, MD   acetaminophen  (TYLENOL ) tablet 650 mg, 650 mg, Oral, Q6H PRN **OR** acetaminophen  (TYLENOL ) suppository 650 mg, 650 mg, Rectal, Q6H PRN, Adefeso, Oladapo, DO   acyclovir (ZOVIRAX) 700 mg in dextrose  5 % 100 mL IVPB, 700 mg, Intravenous, Q8H, Yolande Lamar BROCKS, MD, Last Rate: 114 mL/hr at 11/26/23 1628, 700 mg at 11/26/23 1628   dextrose  50 % solution 50 mL, 1 ampule, Intravenous, Once, Franky Redia SAILOR, MD   folic acid  (FOLVITE ) tablet 1 mg, 1 mg, Oral, Daily, Yolande Lamar BROCKS, MD   HYDROmorphone  (DILAUDID ) injection 0.5 mg, 0.5 mg, Intravenous, Q8H PRN, Ricky Fines, MD, 0.5 mg at 11/26/23 1800   lidocaine  (LIDODERM ) 5 % 1 patch, 1 patch, Transdermal, Q24H, Yolande Lamar BROCKS, MD   LORazepam  (ATIVAN ) injection 1 mg, 1 mg, Intravenous, Q4H PRN, Yolande Lamar BROCKS, MD, 1 mg at 11/26/23 1013   multivitamin with minerals tablet 1 tablet, 1 tablet, Oral, Daily, Yolande Lamar BROCKS, MD   ondansetron  (ZOFRAN ) tablet 4 mg, 4 mg, Oral, Q6H PRN **OR** ondansetron  (ZOFRAN ) injection 4 mg, 4 mg, Intravenous, Q6H PRN, Adefeso, Oladapo, DO   PHENObarbital (LUMINAL) injection 64.8 mg, 64.8 mg, Intravenous, Q8H, 64.8 mg at 11/26/23 2352 **FOLLOWED BY** [START ON 11/28/2023] PHENObarbital (LUMINAL) injection 32.4 mg, 32.4 mg, Intravenous, Q8H, Ricky Fines, MD   scopolamine (TRANSDERM-SCOP) 1 MG/3DAYS 1 mg, 1 patch, Transdermal, Q72H, Ricky Fines, MD, 1 mg at 11/26/23 0838  Vitals   Vitals:   11/26/23 2130 11/26/23 2315 11/27/23 0104 11/27/23 0126  BP: 107/62 125/69 122/71   Pulse: (!) 52 70 (!) 54   Resp: 14 16 18    Temp:    98.2 F (36.8 C)  TempSrc:    Oral  SpO2: 100% 95% 98%   Weight:      Height:        Body mass index is 22.43 kg/m.   Physical Exam   General: Laying  comfortably in bed; in no acute distress.  HENT: Normal oropharynx and mucosa. Normal external appearance of ears and nose. Scabbed wound over the bridge of the nose and L eyebrow. Neck: Supple, no pain or tenderness  CV: No JVD. No peripheral edema.  Pulmonary: Symmetric Chest rise. Normal respiratory effort.  Abdomen: Soft to touch, non-tender.  Ext: No cyanosis, edema, or deformity  Skin: No rash. Normal palpation of skin.   Musculoskeletal: Normal digits and nails by inspection. No clubbing.   Neurologic Examination  Mental status/Cognition: Alert, oriented to self, place, month and year, good attention.  Speech/language: Fluent,  dysarthric speech, comprehension intact, object naming intact, repetition intact.  Cranial nerves:   CN II Pupils equal and reactive to light, no VF deficits    CN III,IV,VI EOM intact, no gaze preference or deviation, no nystagmus    CN V normal sensation in V1, V2, and V3 segments bilaterally    CN VII L facial droop   CN VIII normal hearing to speech    CN IX & X normal palatal elevation, no uvular deviation    CN XI 5/5 head turn and 5/5 shoulder shrug bilaterally    CN XII midline tongue protrusion    Motor:  Muscle bulk: poor, tone normal, pronator drift none tremor none Mvmt Root Nerve  Muscle Right Left Comments  SA C5/6 Ax Deltoid 5 5   EF C5/6 Mc Biceps 5 5   EE C6/7/8 Rad Triceps 5 5   WF C6/7 Med FCR     WE C7/8 PIN ECU     F Ab C8/T1 U ADM/FDI 5 5   HF L1/2/3 Fem Illopsoas 5 4+ Slight L hip weakness seems secondary to pain that he reports is chronic due to prior hip replacement 2/2 avascular necrosis.  KE L2/3/4 Fem Quad 5 5   DF L4/5 D Peron Tib Ant 5 5   PF S1/2 Tibial Grc/Sol 5 5    Sensation:  Light touch Intact throughout   Pin prick    Temperature    Vibration   Proprioception    Coordination/Complex Motor:  - Finger to Nose intact BL - Heel to shin intact BL - Rapid alternating movement are normal - Gait:  deferred.  Labs/Imaging/Neurodiagnostic studies   CBC:  Recent Labs  Lab 2023-12-21 2044 2023-12-21 2110 11/26/23 0612  WBC 7.7  --  6.1  NEUTROABS 6.7  --   --   HGB 12.2* 11.9* 10.1*  HCT 36.1* 35.0* 31.1*  MCV 106.8*  --  108.0*  PLT 277  --  205   Basic Metabolic Panel:  Lab Results  Component Value Date   NA 136 11/26/2023   K 3.1 (L) 11/26/2023   CO2 18 (L) 11/26/2023   GLUCOSE 73 11/26/2023   BUN <5 (L) 11/26/2023   CREATININE 0.67 11/26/2023   CALCIUM  8.0 (L) 11/26/2023   GFRNONAA >60 11/26/2023   GFRAA 99 02/08/2020   Lipid Panel:  Lab Results  Component Value Date   LDLCALC 50 11/12/2023   HgbA1c:  Lab Results  Component Value Date   HGBA1C 5.3 07/21/2023   Urine Drug Screen:     Component Value Date/Time   LABOPIA NEGATIVE 11/26/2023 0129   COCAINSCRNUR NEGATIVE 11/26/2023 0129   LABBENZ POSITIVE (A) 11/26/2023 0129   AMPHETMU NEGATIVE 11/26/2023 0129   THCU NEGATIVE 11/26/2023 0129   LABBARB POSITIVE (A) 11/26/2023 0129    Alcohol Level     Component Value Date/Time   [facility] <15 12/21/2023 2052   INR  Lab Results  Component Value Date   INR 0.9 12-21-23   APTT  Lab Results  Component Value Date   APTT 25 Dec 21, 2023   AED levels: No results found for: PHENYTOIN, ZONISAMIDE, LAMOTRIGINE, LEVETIRACETA  CT Head without contrast(Personally reviewed): 1. Small amount of subarachnoid hemorrhage within a single sulcus of the right frontal lobe and two sulci of the left frontal lobe. 2. New edema in the anterior left temporal lobe since 11/10/23. MRI with and without contrast recommended for further characterization. 3. 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 4. 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  CT angio Head and Neck with contrast(Personally reviewed): No emergent large vessel occlusion or high grade stenosis.  MRI  Brain(Personally reviewed): 1. Abnormal brain MRI most compatible with multifocal sequelae of recent traumatic injury: 2. Multifocal Bilateral subarachnoid hemorrhages. Bilateral subdural hematoma ( 7 mm on the left, 5 mm on the right). 3. Abnormal anterior left temporal lobe and right frontal operculum most likely are cerebral contusions with edema and enhancement. Recommend repeat brain MRI without and with contrast in 4 to 6 weeks to document expected evolution. 4. Subsequent rightward midline shift of 3 mm is stable. 5. No convincing acute infarct.  Neurodiagnostics rEEG:  pending  ASSESSMENT   YSABEL COWGILL is a 53 y.o. male ith hx of chronic EtOh use, GERD, gout, HLD who presented to [facility] ED after fall along with a L facial droop. CT head with  with new edema in the anterior left temporal lobe along with small amount of subarachnoid hemorrhage within a single sulcus of the right frontal lobe and two sulci of the left frontal lobe. These were not noted on CT head on 11/10/23. Patient was therefore started on acyclovir and failed bedside LP.   MRI brain obtained with and without contrast is most concerning for multifocal trauma rather than encephalitis with trace BL SAH, prior SDH and L temporal and R frontal operculum cerebral contusion. Noted R frontal operculum contusion likely explains noted slight L facial droop and slurred speech.  I dont think further workup with LP is going to be helpful.  RECOMMENDATIONS  - discontinue Acyclovir. Imaging and presentation not consistent with HSV encephalitis. - no need for LP. - Recommend PT and OT - routine EEG in AM and if negative, can be discharged with outpatient follow up. - repeat MRI Brain with and without contrast in 4-6 weeks. - Pt started on multivitamin, continue at discharge. Dont think getting B12 and folate levels at this time are going to be accurate. - Vitamin B1 levels seem to have been collected after he  already got the first dose of IV thiamine  and therefore are not going to be accurate. Continue thiamine  100mg  daily PO at discharge. - Defer evaluation of hypoglycemia to primary team. ______________________________________________________________________  Plan discussed with patient at the bedside.  Signed, Erice Ahles, MD [facility]

## 2023-11-27 NOTE — Plan of Care (Signed)

## 2023-11-27 NOTE — Progress Notes (Addendum)
 PROGRESS NOTE        PATIENT DETAILS Name: Christian King Age: 53 y.o. Sex: male Date of Birth: 08-Feb-1971 Admit Date: 11/25/2023 Admitting Physician Posey Maier, DO ERE:[place], No Pcp Per  Brief Summary: Patient is a 53 y.o.  male with history of EtOH use-numerous falls recently (per spouse-ongoing for the past several months)-presented with left facial droop-upon further evaluation with neuroimaging including MRI brain-patient was found to have findings consistent with traumatic brain injury including multifocal bilateral SAH, bilateral SDH, cerebral contusions with edema/enhancements in the left temporal lobe.  Significant events: 10/6>> presented with facial droop-to [name] 10/7>> transferred to [facility].  Significant studies: 10/6>> x-ray left hand: No fracture 10/6>> x-ray pelvis/right hip: No fracture. 10/6>> CTA head/neck: No LVO/high-grade stenosis. 10/6>> CT C-spine: No acute abnormality of the C-spine 10/6>> CT maxillofacial: No fracture 10/6>> CT chest: Left 10th rib fracture. 10/7>> MRI brain:  multifocal bilateral SAH, bilateral SDH, cerebral contusions with edema/enhancements in the left temporal lobe. 10/8>> EEG: Pending  Significant microbiology data: 10/7>> blood culture: No growth  Procedures: None  Consults: Neurosurgery Neurology  Subjective: Subtle left facial droop-accumulating some secretions but awake/alert-coughing.  Very irritable-spouse at bedside-patient gets irritable when spouse interrupts.  Claims he only drinks 2-3 times a week and works Mondays to Fridays.  Spouse at bedside acknowledges that he drinks much more than he acknowledges.  Objective: Vitals: Blood pressure 124/70, pulse (!) 56, temperature 98 F (36.7 C), temperature source Axillary, resp. rate 13, height 5' 9 (1.753 m), weight 68.9 kg, SpO2 97%.   Exam: Gen Exam:Alert awake-not in any distress HEENT:atraumatic, normocephalic Chest: B/L  clear to auscultation anteriorly CVS:S1S2 regular Abdomen:soft non tender, non distended Extremities:no edema Neurology: Non focal Skin: no rash  Pertinent Labs/Radiology:    Latest Ref Rng & Units 11/27/2023    2:23 AM 11/26/2023    6:12 AM 11/25/2023    9:10 PM  CBC  WBC 4.0 - 10.5 K/uL 6.6  6.1    Hemoglobin 13.0 - 17.0 g/dL 88.5  89.8  88.0   Hematocrit 39.0 - 52.0 % 34.4  31.1  35.0   Platelets 150 - 400 K/uL 181  205      Lab Results  Component Value Date   NA 132 (L) 11/27/2023   K 2.9 (L) 11/27/2023   CL 97 (L) 11/27/2023   CO2 24 11/27/2023      Assessment/Plan: TBI with left temporal lobe contusion/edema/bilateral SAH/bilateral SDH Secondary to multiple falls that the patient has sustained in the past several weeks/months-suspect in the context of EtOH intoxication. Appreciate neurosurgical input-no surgical indications Appreciated neurology input-initially there was some suspicion for HSV encephalitis but felt unlikely after MRI brain findings.  No need for LP per neurology. Discussed with trauma service-[name]-PA-C-care is mostly supportive-PT/OT/SLP eval.  ?  Oropharyngeal dysphagia Has subtle left facial droop-apparently has been struggling with oral intake for the past several days Appears to have some accumulation of secretions-requiring frequent suctioning Keep n.p.o. Await SLP eval May need NGT-but hold off till SLP has seen. Aspiration precautions.  Left 10th rib fracture Supportive care Incentive spirometry/flutter valve  Hypokalemia/hypomagnesemia/hypophosphatemia Secondary to EtOH use Replete/recheck.  Rhabdomyolysis Mild CK levels have now normalized with supportive care.  Acute urinary retention Foley catheter inserted overnight (per flowsheet-around 800 cc residual urine drained) Continue Foley for now Once oral intake  is determined after SLP eval-we can contemplate starting Flomax.  EtOH withdrawal Awake-but some  tremors Continue phenobarbital taper. Thiamine  levels pending-but looks like this was drawn after patient has received several doses of thiamine -given neurological issues which are likely due to TBI-and not likely related to Warnicke's/Korsakoff's-Will give IV thiamine  high-dose for a few days and then switch back to switch back to 100 mg daily.  Code status:   Code Status: Full Code   DVT Prophylaxis: SCDs Start: 11/26/23 0338   Family Communication: Spouse at bedside   Disposition Plan: Status is: Inpatient Remains inpatient appropriate because: Severity of illness   Planned Discharge Destination:Home health versus rehab facility   Diet: Diet Order             Diet NPO time specified  Diet effective now                     Antimicrobial agents: Anti-infectives (From admission, onward)    Start     Dose/Rate Route Frequency Ordered Stop   11/26/23 0000  acyclovir (ZOVIRAX) 700 mg in dextrose  5 % 100 mL IVPB  Status:  Discontinued        700 mg 114 mL/hr over 60 Minutes Intravenous Every 8 hours 11/25/23 2334 11/27/23 0346        MEDICATIONS: Scheduled Meds:  folic acid   1 mg Oral Daily   lidocaine   1 patch Transdermal Q24H   multivitamin with minerals  1 tablet Oral Daily   PHENObarbital  64.8 mg Intravenous Q8H   Followed by   [START ON 11/28/2023] PHENObarbital  32.4 mg Intravenous Q8H   scopolamine  1 patch Transdermal Q72H   Continuous Infusions: PRN Meds:.acetaminophen  **OR** acetaminophen , HYDROmorphone  (DILAUDID ) injection, LORazepam , ondansetron  **OR** ondansetron  (ZOFRAN ) IV   I have personally reviewed following labs and imaging studies  LABORATORY DATA: CBC: Recent Labs  Lab 11/25/23 2044 11/25/23 2110 11/26/23 0612 11/27/23 0223  WBC 7.7  --  6.1 6.6  NEUTROABS 6.7  --   --   --   HGB 12.2* 11.9* 10.1* 11.4*  HCT 36.1* 35.0* 31.1* 34.4*  MCV 106.8*  --  108.0* 105.5*  PLT 277  --  205 181    Basic Metabolic Panel: Recent Labs   Lab 11/25/23 2044 11/25/23 2053 11/25/23 2110 11/26/23 0102 11/26/23 0612 11/27/23 0223 11/27/23 0736  NA 138  --  137  --  136 134* 132*  K 3.4*  --  3.4*  --  3.1* 3.2* 2.9*  CL 95*  --  99  --  99 98 97*  CO2 19*  --   --   --  18* 23 24  GLUCOSE 71  --  71  --  73 163* 82  BUN 6  --  5*  --  <5* <5* <5*  CREATININE 0.87  --  0.80  --  0.67 0.70 0.64  CALCIUM  9.3  --   --   --  8.0* 7.6* 7.7*  MG  --  1.1*  --  1.6*  --  1.6* 1.4*  PHOS  --   --   --  2.3*  --   --  1.9*    GFR: Estimated Creatinine Clearance: 101.7 mL/min (by C-G formula based on SCr of 0.64 mg/dL).  Liver Function Tests: Recent Labs  Lab 11/25/23 2044 11/26/23 0612 11/27/23 0223  AST 47* 33 20  ALT 23 16 14   ALKPHOS 63 47 40  BILITOT 1.0 0.7 1.1  PROT  7.1 5.5* 5.4*  ALBUMIN 4.1 3.1* 2.3*   No results for input(s): LIPASE, AMYLASE in the last 168 hours. Recent Labs  Lab 11/25/23 2105 11/26/23 1806  AMMONIA <13 36*    Coagulation Profile: Recent Labs  Lab 11/25/23 2044  INR 0.9    Cardiac Enzymes: Recent Labs  Lab 11/25/23 2044 11/27/23 0223  CKTOTAL 902* 395    BNP (last 3 results) No results for input(s): PROBNP in the last 8760 hours.  Lipid Profile: No results for input(s): CHOL, HDL, LDLCALC, TRIG, CHOLHDL, LDLDIRECT in the last 72 hours.  Thyroid  Function Tests: No results for input(s): TSH, T4TOTAL, FREET4, T3FREE, THYROIDAB in the last 72 hours.  Anemia Panel: No results for input(s): VITAMINB12, FOLATE, FERRITIN, TIBC, IRON, RETICCTPCT in the last 72 hours.  Urine analysis:    Component Value Date/Time   COLORURINE STRAW (A) 05/22/2020 0044   APPEARANCEUR CLEAR 05/22/2020 0044   LABSPEC 1.009 05/22/2020 0044   PHURINE 6.0 05/22/2020 0044   GLUCOSEU NEGATIVE 05/22/2020 0044   HGBUR SMALL (A) 05/22/2020 0044   BILIRUBINUR NEGATIVE 05/22/2020 0044   KETONESUR NEGATIVE 05/22/2020 0044   PROTEINUR NEGATIVE 05/22/2020  0044   NITRITE NEGATIVE 05/22/2020 0044   LEUKOCYTESUR NEGATIVE 05/22/2020 0044    Sepsis Labs: Lactic Acid, Venous    Component Value Date/Time   LATICACIDVEN 2.2 (HH) 05/23/2020 1216    MICROBIOLOGY: Recent Results (from the past 240 hours)  Blood culture (routine x 2)     Status: None (Preliminary result)   Collection Time: 11/26/23 12:59 AM   Specimen: BLOOD  Result Value Ref Range Status   Specimen Description BLOOD BLOOD LEFT ARM  Final   Special Requests   Final    BOTTLES DRAWN AEROBIC AND ANAEROBIC Blood Culture adequate volume   Culture   Final    NO GROWTH 1 DAY Performed at [facility], [address] [place], [place] [address]    Report Status PENDING  Incomplete  Blood culture (routine x 2)     Status: None (Preliminary result)   Collection Time: 11/26/23  1:02 AM   Specimen: BLOOD  Result Value Ref Range Status   Specimen Description BLOOD BLOOD LEFT HAND  Final   Special Requests AEROBIC BOTTLE ONLY Blood Culture adequate volume  Final   Culture   Final    NO GROWTH 1 DAY Performed at [facility], [address], [place], [place] [address]    Report Status PENDING  Incomplete    RADIOLOGY STUDIES/RESULTS: MR Brain W and Wo Contrast Result Date: 11/26/2023 EXAM: MRI BRAIN WITH AND WITHOUT CONTRAST 11/26/2023 07:54:39 AM TECHNIQUE: Multiplanar multisequence MRI of the head/brain was performed with and without the administration of intravenous contrast. COMPARISON: Head CT and CTA head and neck yesterday, prior head CT 11/10/2023, and earlier. CLINICAL HISTORY: 53 year old male clinical history includes multiple recent falls. FINDINGS: BRAIN AND VENTRICLES: Bilateral intracranial hemorrhage. Broad based left sided subdural hematoma measures up to 7 mm in thickness, isointense on FLAIR (series 11 image 17 and series 17 image 15). Smaller contralateral right sided subdural hematoma measures up to 5 mm in thickness and is most pronounced along the right anterior  frontal lobe convexity. Scattered bilateral subarachnoid hemorrhage in both hemispheres. SWI demonstrates the blood products mentioned above plus occasional chronic microhemorrhage in the brain (right parietal lobe white matter). No intraventricular hemorrhage. Patchy and confluent combined abnormal diffusion T2 and FLAIR hyperintensity at the right frontal operculum most resembles a cerebral contusion in the  setting of acute intracranial hemorrhage. This appears to be a largely nonhemorrhagic cerebral contusion. Similar up to 3.5 cm contusion of the anterior left temporal lobe with heterogeneous T2 and FLAIR hyperintensity (series 11 image 9). Following contrast both areas demonstrate abnormal enhancement that is both patchy and cortical, also with mild adjacent dural thickening and enhancement. Mild rightward midline shift of 3 mm, appears stable from yesterday. No ventriculomegaly. Basilar cisterns remain patent. Widespread susceptibility on DWI and signal abnormality felt to be related to cerebral contusions with no convincing evidence of acute infarct. No definite chronic cortical encephalomalacia. The sella is unremarkable. Normal flow voids. ORBITS: No acute abnormality. SINUSES: Mild new paranasal sinus mucosal thickening. Trace fluid in the mastoid air cells. BONES AND SOFT TISSUES: Normal bone marrow signal and enhancement. No acute soft tissue abnormality. IMPRESSION: 1. Abnormal brain MRI most compatible with multifocal sequelae of recent traumatic injury: 2. Multifocal Bilateral subarachnoid hemorrhages. Bilateral subdural hematoma ( 7 mm on the left, 5 mm on the right). 3. Abnormal anterior left temporal lobe and right frontal operculum most likely are cerebral contusions with edema and enhancement. Recommend repeat brain MRI without and with contrast in 4 to 6 weeks to document expected evolution. 4. Subsequent rightward midline shift of 3 mm is stable. 5. No convincing acute infarct. Electronically  signed by: Helayne Hurst MD 11/26/2023 08:15 AM EDT RP Workstation: [id number]   CT Maxillofacial Wo Contrast Result Date: 11/25/2023 EXAM: CT OF THE FACE WITHOUT CONTRAST 11/25/2023 10:15:03 PM TECHNIQUE: CT of the face was performed without the administration of intravenous contrast. Multiplanar reformatted images are provided for review. Automated exposure control, iterative reconstruction, and/or weight based adjustment of the mA/kV was utilized to reduce the radiation dose to as low as reasonably achievable. COMPARISON: None available. CLINICAL HISTORY: Facial trauma. Complains of left sided facial droop and only being able to drink from right side of mouth and drooling out of other side. Patient has also been getting choked on liquids, has slurred speech, and lives alone. Patient's aunt reports multiple falls and that all of these symptoms started Wednesday and have progressively gotten worse. Aunt says she could not get patient to come get checked out. FINDINGS: FACIAL BONES: No acute facial fracture. No mandibular dislocation. No suspicious bone lesion. ORBITS: Globes are intact. No acute traumatic injury. No inflammatory change. SINUSES AND MASTOIDS: No acute abnormality. SOFT TISSUES: No acute abnormality. IMPRESSION: 1. No acute facial fracture. Electronically signed by: Franky Stanford MD 11/25/2023 10:44 PM EDT RP Workstation: [id number]   CT C-SPINE NO CHARGE Result Date: 11/25/2023 EXAM: CT CERVICAL SPINE WITHOUT CONTRAST 11/25/2023 10:15:03 PM TECHNIQUE: CT of the cervical spine was performed without the administration of intravenous contrast. Multiplanar reformatted images are provided for review. Automated exposure control, iterative reconstruction, and/or weight based adjustment of the mA/kV was utilized to reduce the radiation dose to as low as reasonably achievable. COMPARISON: None available. CLINICAL HISTORY: c/o left sided facial droop and only being able to drink from right side of  mouth and drooling out of other side. Pt has also been getting choked on liquids, pt also has slurred speech, pt bib Aunt, pt lives alone. Pt Aunt says pt has had multiple falls, Aunt reports all of these symptoms started Wednesday and have progressively gotten worse. Aunt says she could not get pt to come get checked out. FINDINGS: BONES AND ALIGNMENT: No acute fracture or traumatic malalignment. DEGENERATIVE CHANGES: No significant degenerative changes. SOFT TISSUES: No prevertebral soft tissue swelling. IMPRESSION: 1.  No acute abnormality of the cervical spine. Electronically signed by: Franky Stanford MD 11/25/2023 10:42 PM EDT RP Workstation: [id number]   CT ANGIO HEAD NECK W WO CM Result Date: 11/25/2023 EXAM: CTA Head and Neck with and without Intravenous Contrast. CT Head without Contrast. CLINICAL HISTORY: Acute neuro deficit, stroke suspected. Symptoms include left-sided facial droop, difficulty speaking (slurred speech), difficulty swallowing liquids (choking), and drooling. Symptoms started Wednesday and have progressively worsened. History of multiple falls. TECHNIQUE: Axial CTA images of the head and neck performed with and without intravenous contrast. MIP reconstructed images were created and reviewed. Axial computed tomography images of the head/brain performed without intravenous contrast. Note: Per PQRS, the description of internal carotid artery percent stenosis, including 0 percent or normal exam, is based on [place] American Symptomatic Carotid Endarterectomy Trial (NASCET) criteria. Dose reduction technique was used including one or more of the following: automated exposure control, adjustment of mA and kV according to patient size, and/or iterative reconstruction. CONTRAST: With and without; 75 mL iohexol  (OMNIPAQUE ) 350 MG/ML injection. COMPARISON: 11/10/2023 FINDINGS: CT HEAD: BRAIN: There is a small amount of subarachnoid hemorrhage within a single sulcus of the right frontal lobe and 2  sulci of the left frontal lobe. Edema in the anterior left temporal lobe is new since 11/10/2023. 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 No acute intraparenchymal hemorrhage. No mass lesion. No CT evidence for acute territorial infarct. No midline shift. VENTRICLES: No hydrocephalus. ORBITS: The orbits are unremarkable. SINUSES AND MASTOIDS: The paranasal sinuses and mastoid air cells are clear. CTA NECK: COMMON CAROTID ARTERIES: No significant stenosis. No dissection or occlusion. INTERNAL CAROTID ARTERIES: No stenosis by NASCET criteria. No dissection or occlusion. VERTEBRAL ARTERIES: No significant stenosis. No dissection or occlusion. CTA HEAD: ANTERIOR CEREBRAL ARTERIES: No significant stenosis. No occlusion. No aneurysm. MIDDLE CEREBRAL ARTERIES: No significant stenosis. No occlusion. No aneurysm. POSTERIOR CEREBRAL ARTERIES: No significant stenosis. No occlusion. No aneurysm. BASILAR ARTERY: No significant stenosis. No occlusion. No aneurysm. OTHER: SOFT TISSUES: No acute finding. No masses or lymphadenopathy. BONES: No acute osseous abnormality. IMPRESSION: 1. Small amount of subarachnoid hemorrhage within a single sulcus of the right frontal lobe and two sulci of the left frontal lobe. 2. New edema in the anterior left temporal lobe since 11/10/23. MRI with and without contrast recommended for further characterization. 3. 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 4. 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 5. No emergent large vessel occlusion or high grade stenosis. Findings communicated to Dr. Yolande at 10:41pm on 11/25/2023. Electronically signed by: Franky Stanford MD 11/25/2023 10:41 PM EDT RP Workstation: [id number]   CT Chest Wo  Contrast Result Date: 11/25/2023 CLINICAL DATA:  Recent fall with chest pain, initial encounter EXAM: CT CHEST WITHOUT CONTRAST TECHNIQUE: Multidetector CT imaging of the chest was performed following the standard protocol without IV contrast. RADIATION DOSE REDUCTION: This exam was performed according to the departmental dose-optimization program which includes automated exposure control, adjustment of the mA and/or kV according to patient size and/or use of iterative reconstruction technique. COMPARISON:  11/10/2023 FINDINGS: Cardiovascular: Somewhat limited due to lack of IV contrast. No aneurysmal dilatation is seen. Atherosclerotic calcifications are noted. No pericardial effusion is seen. Mild coronary calcifications are noted. Mediastinum/Nodes: Thoracic inlet is within normal limits.  No hilar or mediastinal adenopathy is noted. The esophagus as visualized is within normal limits. Lungs/Pleura: Lungs are well aerated bilaterally. No focal infiltrate or sizable effusion is seen. No pneumothorax is noted. Upper Abdomen: Cholelithiasis without complicating factors. Musculoskeletal: Left tenth rib fracture is noted posteriorly. No complicating factors are seen. IMPRESSION: Left tenth rib fracture without complicating factors. No other acute abnormality is noted. Aortic Atherosclerosis (ICD10-I70.0). Electronically Signed   By: Oneil Devonshire M.D.   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 Pelvic ring appears intact. Contrast is noted within the renal collecting systems. No acute fracture or dislocation is noted. IMPRESSION: Postsurgical changes without acute abnormality. Electronically Signed   By: Oneil Devonshire M.D.   On: 11/25/2023 22:19   DG Hand Complete Left Result Date:  11/25/2023 CLINICAL DATA:  Left hand pain, no known injury EXAM: LEFT HAND - COMPLETE 3+ VIEW COMPARISON:  None Available. FINDINGS: There is no evidence of fracture or dislocation. There is no evidence of arthropathy or other focal bone abnormality. Soft tissues are unremarkable. IMPRESSION: No acute abnormality noted. Electronically Signed   By: Oneil Devonshire M.D.   On: 11/25/2023 22:18     LOS: 1 day   Donalda Applebaum, MD  [facility]    To contact the attending provider between 7A-7P or the covering provider during after hours 7P-7A, please log into the web site [email or web address] and access using universal [place] password for that web site. If you do not have the password, please call the hospital operator.  11/27/2023, 9:48 AM

## 2023-11-27 NOTE — Evaluation (Signed)
 Physical Therapy Evaluation Patient Details Name: Christian King MRN: [id number] DOB: [birth year] Today's Date: 11/27/2023  History of Present Illness  Patient is a 53 y.o. male presented with left facial droop-upon further evaluation with neuroimaging including MRI brain-patient was found to have findings consistent with traumatic brain injury including multifocal bilateral SAH, bilateral SDH, cerebral contusions with edema/enhancements in the left temporal lobe. Past history of EtOH use-numerous falls recently (per spouse-ongoing for the past several months)  Clinical Impression  Pt presents with admitting diagnosis above. Co-treat with OT. Pt was very lethargic today after receiving ativan . Difficult to understand at times however was very impulsive and agitated at times. CGA to transfer to chair however due to lethargy and impulsivity further ambulation was deferred for safety. Anticipate that pt will likely be able to DC with OPPT however would likely require 24/7 supervision due to cognition. PT will continue to follow.         If plan is discharge home, recommend the following: A little help with walking and/or transfers;A little help with bathing/dressing/bathroom;Assistance with cooking/housework;Direct supervision/assist for medications management;Direct supervision/assist for financial management;Supervision due to cognitive status;Assist for transportation   Can travel by private vehicle        Equipment Recommendations None recommended by PT  Recommendations for Other Services       Functional Status Assessment Patient has had a recent decline in their functional status and demonstrates the ability to make significant improvements in function in a reasonable and predictable amount of time.     Precautions / Restrictions Precautions Precautions: Fall Recall of Precautions/Restrictions: Impaired Restrictions Weight Bearing Restrictions Per Provider Order: No       Mobility  Bed Mobility Overal bed mobility: Needs Assistance Bed Mobility: Supine to Sit     Supine to sit: Supervision          Transfers Overall transfer level: Needs assistance Equipment used: Rolling walker (2 wheels) Transfers: Bed to chair/wheelchair/BSC, Sit to/from Stand Sit to Stand: Contact guard assist, +2 safety/equipment   Step pivot transfers: Contact guard assist, +2 safety/equipment       General transfer comment: CGA for safety due to impulsivity. +2 for lines and leads.    Ambulation/Gait               General Gait Details: Deferred for safety.  Stairs            Wheelchair Mobility     Tilt Bed    Modified Rankin (Stroke Patients Only)       Balance Overall balance assessment: Mild deficits observed, not formally tested                                           Pertinent Vitals/Pain Pain Assessment Pain Assessment: No/denies pain    Home Living Family/patient expects to be discharged to:: Private residence Living Arrangements: Alone   Type of Home: House Home Access: Stairs to enter   [employer] of Steps: 5 bil handrail front porch, 14 bil handrail at back deck Alternate Level Stairs-Number of Steps: flight Home Layout: Multi-level;Bed/bath upstairs;Able to live on main level with bedroom/bathroom Home Equipment: Rolling Walker (2 wheels);Cane - single point      Prior Function Prior Level of Function : Independent/Modified Independent;Working/employed;Driving             Mobility Comments: Per spouse he was independent prior to  admission. ADLs Comments: pt reports ind     Extremity/Trunk Assessment   Upper Extremity Assessment Upper Extremity Assessment: Defer to OT evaluation    Lower Extremity Assessment Lower Extremity Assessment: Overall WFL for tasks assessed    Cervical / Trunk Assessment Cervical / Trunk Assessment: Normal  Communication    Communication Communication: Impaired Factors Affecting Communication: Reduced clarity of speech    Cognition Arousal: Lethargic Behavior During Therapy: Agitated, Impulsive, Flat affect                           PT - Cognition Comments: Pt was very lethargic today after receiving ativan . Difficult to understand at times however was very impulsive and agitated at times. Following commands: Impaired Following commands impaired: Follows one step commands inconsistently, Follows multi-step commands inconsistently     Cueing Cueing Techniques: Verbal cues, Tactile cues     General Comments General comments (skin integrity, edema, etc.): VSS    Exercises     Assessment/Plan    PT Assessment Patient needs continued PT services  PT Problem List Decreased strength;Decreased range of motion;Decreased activity tolerance;Decreased balance;Decreased mobility;Decreased coordination;Decreased cognition;Decreased knowledge of use of DME;Decreased safety awareness;Decreased knowledge of precautions;Cardiopulmonary status limiting activity       PT Treatment Interventions DME instruction;Gait training;Stair training;Functional mobility training;Therapeutic activities;Therapeutic exercise;Balance training;Neuromuscular re-education;Cognitive remediation;Patient/family education    PT Goals (Current goals can be found in the Care Plan section)  Acute Rehab PT Goals Patient Stated Goal: to get out of here PT Goal Formulation: With patient Time For Goal Achievement: 12/11/23 Potential to Achieve Goals: Good    Frequency Min 2X/week     Co-evaluation               AM-PAC PT 6 Clicks Mobility  Outcome Measure Help needed turning from your back to your side while in a flat bed without using bedrails?: None Help needed moving from lying on your back to sitting on the side of a flat bed without using bedrails?: A Little Help needed moving to and from a bed to a chair  (including a wheelchair)?: A Little Help needed standing up from a chair using your arms (e.g., wheelchair or bedside chair)?: A Little Help needed to walk in hospital room?: A Little Help needed climbing 3-5 steps with a railing? : A Little 6 Click Score: 19    End of Session   Activity Tolerance: Patient limited by lethargy;Patient tolerated treatment well Patient left: in chair;with call bell/phone within reach;with chair alarm set;with family/visitor present Nurse Communication: Mobility status PT Visit Diagnosis: Other abnormalities of gait and mobility (R26.89)    Time: 8657-8592 PT Time Calculation (min) (ACUTE ONLY): 25 min   Charges:   PT Evaluation $PT Eval Moderate Complexity: 1 Mod   PT General Charges $$ ACUTE PT VISIT: 1 Visit         Sueellen NOVAK, PT, DPT Acute Rehab Services [phone]   Shondell Poulson 11/27/2023, 3:49 PM

## 2023-11-27 NOTE — Progress Notes (Signed)
   11/27/23 1824  What Happened  Was fall witnessed? No  Was patient injured? Yes (laceration on nose reopened)  Patient found on floor  Found by Staff-comment (Esha)  Stated prior activity ambulating-unassisted  Provider Notification  Provider Name/Title Dr. Raenelle  Date Provider Notified 11/27/23  Time Provider Notified [phone]  Method of Notification Page  Notification Reason Fall  Follow Up  Family notified Yes - comment (Wife - Cy)  Time family notified 0825  Additional tests Yes-comment (CT)  Simple treatment Ice  Progress note created (see row info) Yes  Adult Fall Risk Assessment  Risk Factor Category (scoring not indicated) Fall has occurred during this admission (document High fall risk)  Patient Fall Risk Level High fall risk  Adult Fall Risk Interventions  Required Bundle Interventions *See Row Information* High fall risk  Screening for Fall Injury Risk (To be completed on HIGH fall risk patients) - Assessing Need for Floor Mats  Risk For Fall Injury- Criteria for Floor Mats Admitted as a result of a fall  Will Implement Floor Mats Yes  Vitals  Temp 99.1 F (37.3 C)  Temp Source Oral  BP (!) 146/62  MAP (mmHg) 78  BP Location Left Arm  BP Method Automatic  Patient Position (if appropriate) Lying  Pulse Rate (!) 58  Pulse Rate Source Monitor  Resp 14  Oxygen Therapy  O2 Device Nasal Cannula  O2 Flow Rate (L/min) 2 L/min  Pain Assessment  Pain Scale 0-10  Pain Score 0  PCA/Epidural/Spinal Assessment  Respiratory Pattern Regular;Unlabored  Neurological  Neuro (WDL) WDL  Level of Consciousness Alert  Orientation Level Oriented X4  Cognition Poor safety awareness  Speech Slurred/Dysarthria  R Pupil Size (mm) 4  R Pupil Shape Round  R Pupil Reaction Brisk  L Pupil Size (mm) 4  L Pupil Shape Round  L Pupil Reaction Brisk  Motor Function/Sensation Assessment Motor strength  RUE Motor Strength 5  LUE Motor Strength 5  RLE Motor Strength 4  LLE  Motor Strength 4  Neuro Symptoms Drowsiness  Neuro symptoms relieved by Rest  Glasgow Coma Scale  Eye Opening 4  Best Verbal Response (NON-intubated) 5  Best Motor Response 6  Glasgow Coma Scale Score 15  Musculoskeletal  Musculoskeletal (WDL) X  Assistive Device None  Generalized Weakness Yes  Weight Bearing Restrictions Per Provider Order No  Integumentary  Integumentary (WDL) X  Skin Integrity Abrasion  Abrasion Location Face;Hand  Abrasion Location Orientation Left  Pain Assessment  Result of Injury No   Found face down on floor by Nursing Tech. Multiple Staff Members assisted the patient back to bed. Assessment and Vital Signs were taken.  Patient was alert and oriented. Bed Alarm set to movement in bed. Floor Mats were placed on floor. Call Light within reach and patient reminded to push the Red Button on the call light to call staff.

## 2023-11-27 NOTE — Procedures (Signed)
 Patient Name: Christian King  MRN: [id number]  Epilepsy Attending: Arlin MALVA Krebs  Referring Physician/Provider: Khaliqdina, Salman, MD  Date: 11/27/2023 Duration: 22.22 mins  Patient history: 53 y.o. male ith hx of chronic EtOh use, GERD, gout, HLD who presented to [facility] ED after fall along with a L facial droop. CT head with  with new edema in the anterior left temporal lobe along with small amount of subarachnoid hemorrhage within a single sulcus of the right frontal lobe and two sulci of the left frontal lobe. EEG to evaluate for seizure  Level of alertness: Awake  AEDs during EEG study: Phenobarb, ativan   Technical aspects: This EEG study was done with scalp electrodes positioned according to the 10-20 International system of electrode placement. Electrical activity was reviewed with band pass filter of 1-70Hz , sensitivity of 7 uV/mm, display speed of 32mm/sec with a 60Hz  notched filter applied as appropriate. EEG data were recorded continuously and digitally stored.  Video monitoring was available and reviewed as appropriate.  Description: The posterior dominant rhythm consists of 8 Hz activity of moderate voltage (25-35 uV) seen predominantly in posterior head regions, symmetric and reactive to eye opening and eye closing. EEG showed intermittent generalized 3 to 6 Hz theta-delta slowing. Hyperventilation and photic stimulation were not performed.     ABNORMALITY - Intermittent slow, generalized  IMPRESSION: This study is suggestive of mild diffuse encephalopathy. No seizures or epileptiform discharges were seen throughout the recording.  Lashann Hagg O Kiari Hosmer

## 2023-11-27 NOTE — Progress Notes (Signed)
 Pt arrived via EMS. Admission sugar check 59. MD paged.

## 2023-11-27 NOTE — Evaluation (Signed)
 Clinical/Bedside Swallow Evaluation Patient Details  Name: Christian King MRN: [id number] Date of Birth: 01-Jan-1971  Today's Date: 11/27/2023 Time: SLP Start Time (ACUTE ONLY): 9094 SLP Stop Time (ACUTE ONLY): 0945 SLP Time Calculation (min) (ACUTE ONLY): 40 min  Past Medical History:  Past Medical History:  Diagnosis Date   Alcohol abuse 07/24/2010   B12 deficiency    Blood dyscrasia    hemachromatosis   BMI 25.0-25.9,adult 01/28/2015   Bronchitis    Degenerative joint disease (DJD) of hip    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   Depression    Essential hypertension 04/26/2010   Qualifier: Diagnosis of   By: Gwenn Grimes         GERD (gastroesophageal reflux disease)    Gout    Gout 04/26/2010   Qualifier: Diagnosis of   By: Gwenn Grimes         Hemochromatosis 10/03/2013   History of DVT (deep vein thrombosis) 05/15/2016   History of kidney stones    HLD (hyperlipidemia)    HTN (hypertension)    Hyperlipidemia 04/26/2010   Qualifier: Diagnosis of   By: Gwenn Grimes         Hypokalemia 03/17/2016   Hypothyroidism    Insomnia 02/05/2014   QT prolongation 03/20/2015   Severe alcohol use disorder (HCC) 07/21/2023   Ventricular tachycardia (HCC)    in setting of hypokalemia/ hypomagnesemia and ETOH   Vitamin B12 deficiency 04/26/2010   Qualifier: Diagnosis of   By: Gwenn Grimes         Wears glasses    Past Surgical History:  Past Surgical History:  Procedure Laterality Date   TOTAL HIP ARTHROPLASTY Right    TOTAL HIP ARTHROPLASTY Left 03/16/2016   Procedure: TOTAL HIP ARTHROPLASTY;  Surgeon: Shari Sieving, MD;  Location: [facility] OR;  Service: Orthopedics;  Laterality: Left;   vascularized fibular graft     right hip for avascular necrosis   HPI:  53yo male admitted 11/25/23 after a fall, left facial droop. PMH: severe alcohol use disorder, HLD, depression, GERD, HTN, hypothyroidism, insomnia, hip replacements    Assessment / Plan /  Recommendation  Clinical Impression  Pt presents with altered mentation due to pain medications. He was cooperative with evaluation, able to follow commands. CN exam remarkable for left labial and lingual weakness with moderately dysarthric speech. Pt presents with natural dentition. No history of swallowing difficulty prior to admit, aside from hx GERD for which he takes PPI.   Oral care completed with suction, following which pt accepted trials of thin liquid, puree, and solid textures. Cues required to maintain attention to task given effect of medication. No overt s/s aspiration across multiple presentations of each texture. Recommend dys3 (soft) solids with thin liquids when pt is FULLY AWAKE, ALERT, and UPRIGHT. Meds whole with liquid. Safe swallow precautions reviewed with pt, wife, and RN, and posted at [facility]. SLE deferred at this time due to altered mentation. SLP will continue to follow to assess diet tolerance and readiness to advance solids.  SLP Visit Diagnosis: Dysphagia, unspecified (R13.10)    Aspiration Risk  Moderate aspiration risk;Mild aspiration risk    Diet Recommendation Dysphagia 3 (Mech soft);Thin liquid    Liquid Administration via: Cup;Straw Medication Administration: Whole meds with liquid Supervision: Patient able to self feed;Full supervision/cueing for compensatory strategies Compensations: Minimize environmental distractions;Slow rate;Small sips/bites Postural Changes: Seated upright at 90 degrees;Remain upright for at least 30 minutes after po intake    Other  Recommendations Oral  Care Recommendations: Oral care BID Caregiver Recommendations: Have oral suction available     Assistance Recommended at Discharge  TBD  Functional Status Assessment Patient has had a recent decline in their functional status and demonstrates the ability to make significant improvements in function in a reasonable and predictable amount of time.   Frequency and Duration min 1 x/week   2 weeks       Prognosis Prognosis for improved oropharyngeal function: Good      Swallow Study   General Date of Onset: 11/25/23 HPI: 53yo male admitted 11/25/23 after a fall, left facial droop. PMH: severe alcohol use disorder, HLD, depression, GERD, HTN, hypothyroidism, insomnia, hip replacements Type of Study: Bedside Swallow Evaluation Previous Swallow Assessment: none Diet Prior to this Study: NPO Temperature Spikes Noted: No Respiratory Status: Room air History of Recent Intubation: No Behavior/Cognition: Lethargic/Drowsy;Requires cueing;Cooperative Oral Cavity Assessment: Within Functional Limits Oral Care Completed by SLP: Yes Oral Cavity - Dentition: Adequate natural dentition Vision: Functional for self-feeding Self-Feeding Abilities: Able to feed self;Needs assist Patient Positioning: Upright in bed Baseline Vocal Quality: Normal;Low vocal intensity Volitional Cough: Weak Volitional Swallow: Able to elicit    Oral/Motor/Sensory Function Overall Oral Motor/Sensory Function: Moderate impairment Facial ROM: Reduced left Facial Symmetry: Abnormal symmetry left Facial Strength: Reduced left Facial Sensation: Within Functional Limits Lingual ROM: Reduced left Lingual Symmetry: Abnormal symmetry left Lingual Strength: Reduced Lingual Sensation: Within Functional Limits Mandible: Within Functional Limits   Ice Chips Ice chips: Within functional limits Presentation: Spoon   Thin Liquid Thin Liquid: Within functional limits Presentation: Self Fed;Cup;Straw    Nectar Thick Nectar Thick Liquid: Not tested   Honey Thick Honey Thick Liquid: Not tested   Puree Puree: Within functional limits Presentation: Spoon   Solid     Solid: Impaired Oral Phase Impairments: Poor awareness of bolus     Christian King B. Dory, MSP, CCC-SLP Speech Language Pathologist Office: [phone]  Dory Caprice Daring 11/27/2023,10:04 AM

## 2023-11-27 NOTE — Progress Notes (Signed)
 EEG complete, results are pending.

## 2023-11-27 NOTE — Plan of Care (Signed)
  Problem: Health Behavior/Discharge Planning: Goal: Ability to manage health-related needs will improve Outcome: Progressing   Problem: Coping: Goal: Level of anxiety will decrease Outcome: Progressing   Problem: Pain Managment: Goal: General experience of comfort will improve and/or be controlled Outcome: Progressing   Problem: Clinical Measurements: Goal: Respiratory complications will improve Outcome: Not Progressing

## 2023-11-27 NOTE — Evaluation (Signed)
 Occupational Therapy Evaluation Patient Details Name: Christian King MRN: [id number] DOB: Jul 05, 1970 Today's Date: 11/27/2023   History of Present Illness   Patient is a 53 y.o. male presented with left facial droop-upon further evaluation with neuroimaging including MRI brain-patient was found to have findings consistent with traumatic brain injury including multifocal bilateral SAH, bilateral SDH, cerebral contusions with edema/enhancements in the left temporal lobe. Past history of EtOH use-numerous falls recently (per spouse-ongoing for the past several months)     Clinical Impressions At baseline, pt is Ind with ADLs, IADLs, and functional mobility without an AD. Pt now presents with decreased cognition, increased agitation, decreased B UE coordination (worse on Left); decreased proprioception, decreased activity tolerance, mildly decreased balance, and decreased safety and independence with functional tasks. Pt currently demonstrates ability to complete ADLs largely with Set up to Min assist with cues for sequencing and safety and performs functional step-pivot transfers with Contact guard assist +2 for safety/assistance with lines/equipment. Pt VSS on RA. Pt will benefit from acute skilled OT services to address deficits and increase safety and independence with functional tasks. Post acute discharge, pt will benefit from outpatient OT to maximize rehab potential paired with 24/7 supervision/assistance from family/caregiver in the home for safety.      If plan is discharge home, recommend the following:   A little help with walking and/or transfers;A little help with bathing/dressing/bathroom;Assistance with cooking/housework;Direct supervision/assist for medications management;Direct supervision/assist for financial management;Assist for transportation;Help with stairs or ramp for entrance;Supervision due to cognitive status (Pt currently requires 24/7 supervision for safety)      Functional Status Assessment   Patient has had a recent decline in their functional status and demonstrates the ability to make significant improvements in function in a reasonable and predictable amount of time.     Equipment Recommendations   Other (comment) (TBD based on pt progress)     Recommendations for Other Services         Precautions/Restrictions   Precautions Precautions: Fall Recall of Precautions/Restrictions: Impaired Restrictions Weight Bearing Restrictions Per Provider Order: No     Mobility Bed Mobility Overal bed mobility: Needs Assistance Bed Mobility: Supine to Sit     Supine to sit: Supervision, Contact guard     General bed mobility comments: Supervision for safety and assist with line management    Transfers Overall transfer level: Needs assistance Equipment used: Rolling walker (2 wheels) Transfers: Bed to chair/wheelchair/BSC, Sit to/from Stand Sit to Stand: Contact guard assist, +2 safety/equipment     Step pivot transfers: Contact guard assist, +2 safety/equipment     General transfer comment: CGA for safety due to impulsivity. +2 for lines and leads.      Balance Overall balance assessment: Mild deficits observed, not formally tested                                         ADL either performed or assessed with clinical judgement   ADL Overall ADL's : Needs assistance/impaired Eating/Feeding: Set up;Sitting   Grooming: Set up;Sitting   Upper Body Bathing: Contact guard assist;Cueing for sequencing;Sitting   Lower Body Bathing: Minimal assistance;Sitting/lateral leans;Sit to/from stand;Cueing for sequencing;Cueing for safety   Upper Body Dressing : Minimal assistance;Sitting;Cueing for sequencing   Lower Body Dressing: Minimal assistance;Cueing for safety;Cueing for sequencing;Sitting/lateral leans;Sit to/from stand   Toilet Transfer: Contact guard assist;BSC/3in1;Rolling walker (2 wheels);Cueing  for safety;Cueing for sequencing (  step-pivot); +2 for safety/equipment Toilet Transfer Details (indicate cue type and reason): simulated from bed to recliner Toileting- Clothing Manipulation and Hygiene: Minimal assistance;Cueing for safety;Cueing for sequencing;Sit to/from stand       Functional mobility during ADLs:  (deferred for pt/therapist safety this sesison due to pt impulsivness and pt distracted by IV and telemetry cords)       Vision Baseline Vision/History: 1 Wears glasses Ability to See in Adequate Light: 0 Adequate Patient Visual Report: No change from baseline Vision Assessment?: Yes Eye Alignment: Within Functional Limits Ocular Range of Motion: Within Functional Limits Alignment/Gaze Preference: Within Defined Limits Tracking/Visual Pursuits: Able to track stimulus in all quads without difficulty Saccades: Within functional limits Convergence: Impaired (comment) (increased time Bilaterally, worse on Right) Visual Fields: No apparent deficits Diplopia Assessment:  (Pt reports no diplopia) Depth Perception:  [facility]) Additional Comments: Pt requiring occasional cues and increased time for following instructions for vision screen     Perception         Praxis         Pertinent Vitals/Pain Pain Assessment Pain Assessment: No/denies pain Pain Intervention(s): Monitored during session     Extremity/Trunk Assessment Upper Extremity Assessment Upper Extremity Assessment: Right hand dominant;RUE deficits/detail;LUE deficits/detail RUE Deficits / Details: strength and AROm WFL; impaired fine and gross motor coordination (worse on Left); decreased proprioception (worse on Left) RUE Sensation: decreased proprioception RUE Coordination: decreased fine motor;decreased gross motor LUE Deficits / Details: strength and AROm WFL; impaired fine and gross motor coordination (worse on Left); decreased proprioception (worse on Left) LUE Sensation: decreased proprioception LUE  Coordination: decreased gross motor;decreased fine motor   Lower Extremity Assessment Lower Extremity Assessment: Defer to PT evaluation   Cervical / Trunk Assessment Cervical / Trunk Assessment: Normal   Communication Communication Communication: Impaired Factors Affecting Communication: Reduced clarity of speech   Cognition Arousal: Lethargic, Alert (Initially lethargic but alerting with activity) Behavior During Therapy: Agitated, Impulsive, Flat affect Cognition: Cognition impaired     Awareness: Intellectual awareness intact, Online awareness impaired Memory impairment (select all impairments): Working memory Attention impairment (select first level of impairment): Selective attention (Pt easily distracted by IV and telemetry cords) Executive functioning impairment (select all impairments): Organization, Sequencing, Reasoning, Problem solving OT - Cognition Comments: Pt oriented x4 with noted cognitive deficits above. Pt impulsive and intermittently agitated during session. Pt with poor insight into deficits and poor safety awareness.                 Following commands: Impaired Following commands impaired: Follows one step commands inconsistently, Follows multi-step commands inconsistently     Cueing  General Comments   Cueing Techniques: Verbal cues;Tactile cues  VSS on RA. Pt's wife present during majority of session. RN present during a portion of session.   Exercises     Shoulder Instructions      Home Living Family/patient expects to be discharged to:: Private residence Living Arrangements: Alone Available Help at Discharge: Family;Available PRN/intermittently Type of Home: House Home Access: Stairs to enter [employer] of Steps: 5 at front porch, 14 at back deck Entrance Stairs-Rails: Right;Left (at both doors) Home Layout: Multi-level;Bed/bath upstairs;Able to live on main level with bedroom/bathroom Alternate Level Stairs-Number of Steps:  flight   Bathroom Shower/Tub: Walk-in shower         Home Equipment: Agricultural consultant (2 wheels);Cane - single point   Additional Comments: Pt has 2 dogs. Pt's wife recently moved out of the home; however, wife present during session and  reports she can provide intermittent assistance in the home      Prior Functioning/Environment Prior Level of Function : Independent/Modified Independent;Working/employed;Driving             Mobility Comments: Per spouse he was independent prior to admission. Per chart. pt with multiple recent falls. ADLs Comments: Pt reports he is Ind with ADLs and IADLs at baseline. 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 since last hospital admission    OT Problem List: Decreased activity tolerance;Decreased coordination;Decreased cognition;Decreased safety awareness;Decreased knowledge of precautions   OT Treatment/Interventions: Self-care/ADL training;Therapeutic exercise;DME and/or AE instruction;Energy conservation;Therapeutic activities;Cognitive remediation/compensation;Patient/family education;Balance training      OT Goals(Current goals can be found in the care plan section)   Acute Rehab OT Goals Patient Stated Goal: to return home OT Goal Formulation: With patient/family Time For Goal Achievement: 12/11/23 Potential to Achieve Goals: Good ADL Goals Pt Will Perform Grooming: with supervision;standing Pt Will Perform Lower Body Bathing: with supervision;sit to/from stand Pt Will Perform Upper Body Dressing: with modified independence;sitting Pt Will Perform Lower Body Dressing: with supervision;sit to/from stand Pt Will Transfer to Toilet: with supervision;ambulating;regular height toilet (with least restrictive AD) Pt Will Perform Toileting - Clothing Manipulation and hygiene: with supervision;sitting/lateral leans;sit to/from stand Additional ADL Goal #1: Patient will participate in home exercise program to increased B  UE fine motor coordination with Supervision and with written HEP provided.   OT Frequency:  Min 2X/week    Co-evaluation              AM-PAC OT 6 Clicks Daily Activity     Outcome Measure Help from another person eating meals?: A Little Help from another person taking care of personal grooming?: A Little Help from another person toileting, which includes using toliet, bedpan, or urinal?: A Little Help from another person bathing (including washing, rinsing, drying)?: A Little Help from another person to put on and taking off regular upper body clothing?: A Little Help from another person to put on and taking off regular lower body clothing?: A Little 6 Click Score: 18   End of Session Equipment Utilized During Treatment: Rolling walker (2 wheels) Nurse Communication: Mobility status  Activity Tolerance: Patient tolerated treatment well;Other (comment) (Pt limited by agitation and current cognitve level) Patient left: in chair;with call bell/phone within reach;with chair alarm set;with family/visitor present  OT Visit Diagnosis: Unsteadiness on feet (R26.81);Repeated falls (R29.6);History of falling (Z91.81);Ataxia, unspecified (R27.0);Other symptoms and signs involving cognitive function                Time: 8657-8592 OT Time Calculation (min): 25 min Charges:  OT General Charges $OT Visit: 1 Visit OT Evaluation $OT Eval Moderate Complexity: 1 Mod  Margarie Rockey HERO., OTR/L, MA Acute Rehab [phone]   Margarie FORBES Horns 11/27/2023, 5:46 PM

## 2023-11-27 NOTE — Progress Notes (Signed)
 EEG not able to be performed at this time. Patient would be best served by two EEG techs.   Deferred to Day team.

## 2023-11-27 NOTE — Plan of Care (Signed)
 Neurology plan of care  Please see neurology consult yesterday for full findings and recommendations. EEG showed diffuse slowing but no epileptiform discharges. Neurology to sign off but feel free to re-engage if additional neurologic concerns arise.  Christian Ross, MD [facility] [phone]  If 7pm- 7am, please page neurology on call as listed in AMION.

## 2023-11-28 DIAGNOSIS — F10930 Alcohol use, unspecified with withdrawal, uncomplicated: Secondary | ICD-10-CM | POA: Diagnosis not present

## 2023-11-28 DIAGNOSIS — F10939 Alcohol use, unspecified with withdrawal, unspecified: Secondary | ICD-10-CM | POA: Diagnosis not present

## 2023-11-28 DIAGNOSIS — R29810 Facial weakness: Secondary | ICD-10-CM

## 2023-11-28 DIAGNOSIS — S2232XA Fracture of one rib, left side, initial encounter for closed fracture: Secondary | ICD-10-CM | POA: Diagnosis not present

## 2023-11-28 DIAGNOSIS — W19XXXA Unspecified fall, initial encounter: Secondary | ICD-10-CM | POA: Diagnosis not present

## 2023-11-28 LAB — CBC
HCT: 33.8 % — ABNORMAL LOW (ref 39.0–52.0)
Hemoglobin: 11.8 g/dL — ABNORMAL LOW (ref 13.0–17.0)
MCH: 35.8 pg — ABNORMAL HIGH (ref 26.0–34.0)
MCHC: 34.9 g/dL (ref 30.0–36.0)
MCV: 102.4 fL — ABNORMAL HIGH (ref 80.0–100.0)
Platelets: 189 K/uL (ref 150–400)
RBC: 3.3 MIL/uL — ABNORMAL LOW (ref 4.22–5.81)
RDW: 13 % (ref 11.5–15.5)
WBC: 7.3 K/uL (ref 4.0–10.5)
nRBC: 0 % (ref 0.0–0.2)

## 2023-11-28 LAB — BASIC METABOLIC PANEL WITH GFR
Anion gap: 15 (ref 5–15)
BUN: <5 mg/dL — ABNORMAL LOW (ref 6–20)
CO2: 21 mmol/L — ABNORMAL LOW (ref 22–32)
Calcium: 8.4 mg/dL — ABNORMAL LOW (ref 8.9–10.3)
Chloride: 97 mmol/L — ABNORMAL LOW (ref 98–111)
Creatinine, Ser: 0.68 mg/dL (ref 0.61–1.24)
GFR, Estimated: >60 mL/min (ref >60)
Glucose, Bld: 95 mg/dL (ref 70–99)
Potassium: 3.3 mmol/L — ABNORMAL LOW (ref 3.5–5.1)
Sodium: 133 mmol/L — ABNORMAL LOW (ref 135–145)

## 2023-11-28 LAB — VITAMIN B1: Vitamin B1 (Thiamine): 226.5 nmol/L — ABNORMAL HIGH (ref 66.5–200.0)

## 2023-11-28 LAB — GLUCOSE, CAPILLARY
Glucose-Capillary: 94 mg/dL (ref 70–99)
Glucose-Capillary: 81 mg/dL (ref 70–99)
Glucose-Capillary: 67 mg/dL — ABNORMAL LOW (ref 70–99)
Glucose-Capillary: 95 mg/dL (ref 70–99)
Glucose-Capillary: 77 mg/dL (ref 70–99)
Glucose-Capillary: 85 mg/dL (ref 70–99)

## 2023-11-28 LAB — MAGNESIUM: Magnesium: 1.6 mg/dL — ABNORMAL LOW (ref 1.7–2.4)

## 2023-11-28 LAB — PHOSPHORUS: Phosphorus: 3.2 mg/dL (ref 2.5–4.6)

## 2023-11-28 MED ORDER — MAGNESIUM SULFATE 2 GM/50ML IV SOLN
2.0000 g | Freq: Once | INTRAVENOUS | Status: AC
  Administered 2023-11-28: 2 g via INTRAVENOUS
  Filled 2023-11-28: qty 50

## 2023-11-28 MED ORDER — POTASSIUM CHLORIDE 20 MEQ PO PACK
40.0000 meq | PACK | Freq: Four times a day (QID) | ORAL | Status: AC
  Administered 2023-11-28 (×2): 40 meq via ORAL
  Filled 2023-11-28 (×2): qty 2

## 2023-11-28 MED ORDER — CHLORHEXIDINE GLUCONATE CLOTH 2 % EX PADS
6.0000 | MEDICATED_PAD | Freq: Every day | CUTANEOUS | Status: DC
  Administered 2023-11-28 – 2023-12-06 (×9): 6 via TOPICAL

## 2023-11-28 NOTE — Progress Notes (Signed)
 Initial Nutrition Assessment  DOCUMENTATION CODES:   Non-severe (moderate) malnutrition in context of social or environmental circumstances  INTERVENTION:  Recommend short-term nutrition support until mentation improves If appropriate and access obtained, initiate tube feeding via Cortrak: Osmolite 1.5 at 55 ml/h (1320 ml per day) Initiate at 25ml/hr and increase by 10ml/hr q8h until goal rate achieved Prosource TF20 60 ml 1x daily  Provides 2060 kcal, 103 gm protein, 1006 ml free water daily   Initiate calorie count to assess intake and need for nutrition support Continue thiamine /folic acid ; add MVI w/ minerals r/t EtOH use Continue to monitor magnesium , potassium, and phosphorus daily for at least 3 days, MD to replete as needed Monitor bowels and the need for modification of current bowel regimen Monitor diet advancement/tolerance  Add Ensure Plus High Protein po BID, each supplement provides 350 kcal and 20 grams of protein    NUTRITION DIAGNOSIS:  Moderate Malnutrition related to social / environmental circumstances as evidenced by percent weight loss, moderate muscle depletion, mild fat depletion.  GOAL:  Patient will meet greater than or equal to 90% of their needs  MONITOR:  PO intake, Supplement acceptance, Diet advancement, Labs, Weight trends  REASON FOR ASSESSMENT:  Consult Assessment of nutrition requirement/status  ASSESSMENT:   Pt with PMH significant for: severe EtOH use disorder with multiple falls over last few months, HTN, HLD, VT, depression, GERD, gout, and B12 deficiency. Presented to [facility] ED with left facial droop after reported fall approximately one week ago where he hit his head. 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  10/6: presented to [facility] ED; MRI brain: multifocal b/l SAH, b/l SDH, cerebral contusions w/ edema/enhancements in the left  temporal lobe  10/7: txr to [facility] 10/8: mechanical fall in patient room 10/9: downgraded to DYS1/thins (ONLY WHILE ALERT)  Received secure chat from speech regarding downgrade to patient diet due to AMS. Patient with orders for diet only to be consumed with supervision and when alert enough. Recommending calorie count. Consult received for assessment of nutrition status.   24 Hour Recall B: skips or pastry w/ coffee mug of milk D: burger Snacks: nuts  Reported to patient bedside. Patient very lethargic and unable to engage. Spoke with his wife who is also at bedside. She states they have been separated for approximately two months.  She is visibly upset/tearful and immediately voicing concern over his nutrition status. Concerned regarding his prolonged poor appetite/intake and feels he is regressing due to lack of nutrition. Discussed contributors to his currently altered state, such as withdrawals. She is unwilling to consider this stating, he has not had a drink in days and was admitted with no alcohol in his system.  She reports he eats very little to begin with and drinks most of the time. She is concerned about his low blood sugars and his electrolyte derangements. Discussed refeeding and management of these issues as well as varying time frames associated with improvement in trends.  She brings up nutrition support. Feels he will not get better without it. Spoke with MD who would prefer to wait and see if he improves, but acknowledges that he could benefit from short term nutrition support, if needed, until altered mental status improves. Not scheduled to complete phenobarbitol taper until later in the day tomorrow. Relayed this to patient's wife and that we would re-assess for Cortrak placement tomorrow as that is when they are next on service.  She continues to voice concerns that he is so sedated, he cannot consume adequate calories/protein.   Admit/Current Weight: 68.9 kg  Wife states  UBW around 175lbs in April of this year. Has lost down to 158lbs more recently. This is a 9.7% weight loss in six months. While concerning, it is not considered clinically significant for the time frame reviewed. Per chart review, he is 7lbs lower that reported UBW now. This is a 13.3% weight loss in six months and considered clinically significant for the time frame reviewed. She reports no edema at baseline and none on exam today.   Drains/Lines: Foley catheter UOP: 3525 x24 hours  He is refeeding on dextrose . Has required potassium, phosphorus, and magnesium  repletion. Regular lab draws ongoing. High dose thiamine  via IV for a couple of days, then back to 100mcg daily.   Meds: folic acid , MVI, thiamine , phenobarbitol  Labs:  Na+ 134>132>133 (L) K+ 3.2>2.9>3.3 (L) PHOS 2.3>1.9>3.2 (wdl) Mg 1.6>1.4>1.6 (wdl) CBGs 82-163 x24 hours A1c 5.3 (07/2023)  NUTRITION - FOCUSED PHYSICAL EXAM:  Flowsheet Row Most Recent Value  Orbital Region No depletion  Upper Arm Region No depletion  Thoracic and Lumbar Region Moderate depletion  Buccal Region Mild depletion  Temple Region No depletion  Clavicle Bone Region Mild depletion  Clavicle and Acromion Bone Region Mild depletion  Scapular Bone Region Moderate depletion  Dorsal Hand No depletion  Patellar Region Moderate depletion  Anterior Thigh Region Severe depletion  Posterior Calf Region Mild depletion  Edema (RD Assessment) None  Hair Reviewed  Eyes Unable to assess  Mouth Reviewed  Skin Reviewed  Nails Reviewed   Diet Order:   Diet Order             DIET - DYS 1 Room service appropriate? Yes; Fluid consistency: Thin  Diet effective now            EDUCATION NEEDS:  Not appropriate for education at this time  Skin:  Skin Assessment: Reviewed RN Assessment  Last BM:  10/6  Height:  Ht Readings from Last 1 Encounters:  11/25/23 5' 9 (1.753 m)   Weight:  Wt Readings from Last 1 Encounters:  11/25/23 68.9 kg   Ideal  Body Weight:  72.7 kg  BMI:  Body mass index is 22.43 kg/m.  Estimated Nutritional Needs:   Kcal:  1900-2100 kcals  Protein:  90-105g  Fluid:  1.9-2.1L/day  Blair Deaner MS, RD, LDN Registered Dietitian Clinical Nutrition RD Inpatient Contact Info in Amion

## 2023-11-28 NOTE — Progress Notes (Signed)
 PROGRESS NOTE        PATIENT DETAILS Name: Christian King Age: 53 y.o. Sex: male Date of Birth: 06/24/1970 Admit Date: 11/25/2023 Admitting Physician Posey Maier, DO ERE:[id number], No Pcp Per  Brief Summary: Patient is a 53 y.o.  male with history of EtOH use-numerous falls recently (per spouse-ongoing for the past several months)-presented with left facial droop-upon further evaluation with neuroimaging including MRI brain-patient was found to have findings consistent with traumatic brain injury including multifocal bilateral SAH, bilateral SDH, cerebral contusions with edema/enhancements in the left temporal lobe.  Significant events: 10/6>> presented with facial droop-to [name] 10/7>> transferred to [facility]. 10/8>> mechanical fall after spouse left the room-repeat CT head/maxillofacial-stable.  Significant studies: 10/6>> x-ray left hand: No fracture 10/6>> x-ray pelvis/right hip: No fracture. 10/6>> CTA head/neck: No LVO/high-grade stenosis. 10/6>> CT C-spine: No acute abnormality of the C-spine 10/6>> CT maxillofacial: No fracture 10/6>> CT chest: Left 10th rib fracture. 10/7>> MRI brain:  multifocal bilateral SAH, bilateral SDH, cerebral contusions with edema/enhancements in the left temporal lobe. 10/8>> EEG: Negative for seizures. 10/8>> CT head: Stable prior SDH versus hygroma-stable SAH. 10/8>> CT maxillofacial: No facial bone fracture.  Significant microbiology data: 10/7>> blood culture: No growth  Procedures: None  Consults: Neurosurgery Neurology  Subjective: Sleepy-but answers questions appropriately-spouse at bedside.  Objective: Vitals: Blood pressure 100/65, pulse 62, temperature 98 F (36.7 C), temperature source Oral, resp. rate 18, height 5' 9 (1.753 m), weight 68.9 kg, SpO2 96%.   Exam: Gen Exam:Alert awake-not in any distress HEENT:atraumatic, normocephalic Chest: B/L clear to auscultation  anteriorly CVS:S1S2 regular Abdomen:soft non tender, non distended Extremities:no edema Neurology: Non focal Skin: no rash  Pertinent Labs/Radiology:    Latest Ref Rng & Units 11/28/2023    2:20 AM 11/27/2023    2:23 AM 11/26/2023    6:12 AM  CBC  WBC 4.0 - 10.5 K/uL 7.3  6.6  6.1   Hemoglobin 13.0 - 17.0 g/dL 88.1  88.5  89.8   Hematocrit 39.0 - 52.0 % 33.8  34.4  31.1   Platelets 150 - 400 K/uL 189  181  205     Lab Results  Component Value Date   NA 133 (L) 11/28/2023   K 3.3 (L) 11/28/2023   CL 97 (L) 11/28/2023   CO2 21 (L) 11/28/2023      Assessment/Plan: TBI with left temporal lobe contusion/edema/bilateral SAH/bilateral SDH Secondary to multiple falls that the patient has sustained in the past several weeks/months-suspect in the context of EtOH intoxication. Appreciate neurosurgical input-no surgical indications Appreciated neurology input-initially there was some suspicion for HSV encephalitis but felt unlikely after MRI brain findings.  No need for LP per neurology. Discussed with trauma service-[name]-PA-C on 10/8-care is mostly supportive-PT/OT/SLP eval. Unfortunately-had another mechanical fall 10/8-has been counseled to let nursing staff know before he gets out of bed.  Discussed with nursing staff-bedside sitter ordered-fall precautions.  ?  Oropharyngeal dysphagia Has subtle left facial droop-apparently has been struggling with oral intake for the past several days Still accumulating some secretions but better than yesterday Appreciate SLP eval Per spouse at bedside-he has been tolerating diet Continue to maintain strict aspiration precautions.  Left 10th rib fracture Supportive care Incentive spirometry/flutter valve  Hypokalemia/hypomagnesemia/hypophosphatemia Secondary to EtOH use Replete/recheck.  Rhabdomyolysis Mild CK levels have now normalized with supportive care.  Acute urinary  retention Foley catheter inserted overnight (per  flowsheet-around 800 cc residual urine drained) Continue Foley catheter Continue Flomax A voiding trial when is a bit more ambulatory-and a bit more awake/alert.  EtOH withdrawal Awake-but some tremors Continue phenobarbital taper. Thiamine  levels pending-but looks like this was drawn after patient has received several doses of thiamine -given neurological issues which are likely due to TBI-and not likely related to Warnicke's/Korsakoff's-Will give IV thiamine  high-dose for a few days and then switch back to switch back to 100 mg daily.  Nutrition Status: Nutrition Problem: Moderate Malnutrition Etiology: social / environmental circumstances Signs/Symptoms: percent weight loss, moderate muscle depletion, mild fat depletion Percent weight loss: 13.3 % Interventions: Ensure Enlive (each supplement provides 350kcal and 20 grams of protein), MVI, Refer to RD note for recommendations, Calorie Count    Code status:   Code Status: Full Code   DVT Prophylaxis: SCDs Start: 11/26/23 0338   Family Communication: Spouse at bedside   Disposition Plan: Status is: Inpatient Remains inpatient appropriate because: Severity of illness   Planned Discharge Destination:Home health versus rehab facility   Diet: Diet Order             DIET DYS 3 Room service appropriate? Yes; Fluid consistency: Thin  Diet effective now                     Antimicrobial agents: Anti-infectives (From admission, onward)    Start     Dose/Rate Route Frequency Ordered Stop   11/26/23 0000  acyclovir (ZOVIRAX) 700 mg in dextrose  5 % 100 mL IVPB  Status:  Discontinued        700 mg 114 mL/hr over 60 Minutes Intravenous Every 8 hours 11/25/23 2334 11/27/23 0346        MEDICATIONS: Scheduled Meds:  folic acid   1 mg Oral Daily   lidocaine   1 patch Transdermal Q24H   multivitamin with minerals  1 tablet Oral Daily   PHENObarbital  64.8 mg Intravenous Q8H   Followed by   PHENObarbital  32.4 mg  Intravenous Q8H   potassium chloride   40 mEq Oral Q6H   scopolamine  1 patch Transdermal Q72H   tamsulosin  0.4 mg Oral Daily   [START ON 11/30/2023] thiamine  (VITAMIN B1) injection  100 mg Intravenous Daily   Continuous Infusions:  magnesium  sulfate bolus IVPB     thiamine  (VITAMIN B1) injection 500 mg (11/27/23 1348)   PRN Meds:.acetaminophen  **OR** acetaminophen , dextrose , HYDROmorphone  (DILAUDID ) injection, LORazepam , ondansetron  **OR** ondansetron  (ZOFRAN ) IV   I have personally reviewed following labs and imaging studies  LABORATORY DATA: CBC: Recent Labs  Lab 11/25/23 2044 11/25/23 2110 11/26/23 0612 11/27/23 0223 11/28/23 0220  WBC 7.7  --  6.1 6.6 7.3  NEUTROABS 6.7  --   --   --   --   HGB 12.2* 11.9* 10.1* 11.4* 11.8*  HCT 36.1* 35.0* 31.1* 34.4* 33.8*  MCV 106.8*  --  108.0* 105.5* 102.4*  PLT 277  --  205 181 189    Basic Metabolic Panel: Recent Labs  Lab 11/25/23 2044 11/25/23 2053 11/25/23 2110 11/26/23 0102 11/26/23 0612 11/27/23 0223 11/27/23 0736 11/28/23 0220  NA 138  --  137  --  136 134* 132* 133*  K 3.4*  --  3.4*  --  3.1* 3.2* 2.9* 3.3*  CL 95*  --  99  --  99 98 97* 97*  CO2 19*  --   --   --  18* 23 24 21*  GLUCOSE 71  --  71  --  73 163* 82 95  BUN 6  --  5*  --  <5* <5* <5* <5*  CREATININE 0.87  --  0.80  --  0.67 0.70 0.64 0.68  CALCIUM  9.3  --   --   --  8.0* 7.6* 7.7* 8.4*  MG  --  1.1*  --  1.6*  --  1.6* 1.4* 1.6*  PHOS  --   --   --  2.3*  --   --  1.9* 3.2    GFR: Estimated Creatinine Clearance: 101.7 mL/min (by C-G formula based on SCr of 0.68 mg/dL).  Liver Function Tests: Recent Labs  Lab 11/25/23 2044 11/26/23 0612 11/27/23 0223  AST 47* 33 20  ALT 23 16 14   ALKPHOS 63 47 40  BILITOT 1.0 0.7 1.1  PROT 7.1 5.5* 5.4*  ALBUMIN 4.1 3.1* 2.3*   No results for input(s): LIPASE, AMYLASE in the last 168 hours. Recent Labs  Lab 11/25/23 2105 11/26/23 1806  AMMONIA <13 36*    Coagulation Profile: Recent  Labs  Lab 11/25/23 2044  INR 0.9    Cardiac Enzymes: Recent Labs  Lab 11/25/23 2044 11/27/23 0223  CKTOTAL 902* 395    BNP (last 3 results) No results for input(s): PROBNP in the last 8760 hours.  Lipid Profile: No results for input(s): CHOL, HDL, LDLCALC, TRIG, CHOLHDL, LDLDIRECT in the last 72 hours.  Thyroid  Function Tests: No results for input(s): TSH, T4TOTAL, FREET4, T3FREE, THYROIDAB in the last 72 hours.  Anemia Panel: No results for input(s): VITAMINB12, FOLATE, FERRITIN, TIBC, IRON, RETICCTPCT in the last 72 hours.  Urine analysis:    Component Value Date/Time   COLORURINE STRAW (A) 05/22/2020 0044   APPEARANCEUR CLEAR 05/22/2020 0044   LABSPEC 1.009 05/22/2020 0044   PHURINE 6.0 05/22/2020 0044   GLUCOSEU NEGATIVE 05/22/2020 0044   HGBUR SMALL (A) 05/22/2020 0044   BILIRUBINUR NEGATIVE 05/22/2020 0044   KETONESUR NEGATIVE 05/22/2020 0044   PROTEINUR NEGATIVE 05/22/2020 0044   NITRITE NEGATIVE 05/22/2020 0044   LEUKOCYTESUR NEGATIVE 05/22/2020 0044    Sepsis Labs: Lactic Acid, Venous    Component Value Date/Time   LATICACIDVEN 2.2 (HH) 05/23/2020 1216    MICROBIOLOGY: Recent Results (from the past 240 hours)  Blood culture (routine x 2)     Status: None (Preliminary result)   Collection Time: 11/26/23 12:59 AM   Specimen: BLOOD  Result Value Ref Range Status   Specimen Description BLOOD BLOOD LEFT ARM  Final   Special Requests   Final    BOTTLES DRAWN AEROBIC AND ANAEROBIC Blood Culture adequate volume   Culture   Final    NO GROWTH 2 DAYS Performed at [facility], [address] [place], [place] [address]    Report Status PENDING  Incomplete  Blood culture (routine x 2)     Status: None (Preliminary result)   Collection Time: 11/26/23  1:02 AM   Specimen: BLOOD  Result Value Ref Range Status   Specimen Description BLOOD BLOOD LEFT HAND  Final   Special Requests AEROBIC BOTTLE ONLY Blood Culture  adequate volume  Final   Culture   Final    NO GROWTH 2 DAYS Performed at [facility], [address] [place], [place] [address]    Report Status PENDING  Incomplete    RADIOLOGY STUDIES/RESULTS: CT HEAD WO CONTRAST ( ) Result Date: 11/27/2023 CLINICAL DATA:  Facial trauma, blunt; Polytrauma, blunt EXAM: CT HEAD WITHOUT CONTRAST CT MAXILLOFACIAL  WITHOUT CONTRAST TECHNIQUE: Multidetector CT imaging of the head and maxillofacial structures were performed using the standard protocol without intravenous contrast. Multiplanar CT image reconstructions of the maxillofacial structures were also generated. RADIATION DOSE REDUCTION: This exam was performed according to the departmental dose-optimization program which includes automated exposure control, adjustment of the mA and/or kV according to patient size and/or use of iterative reconstruction technique. 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 Stable mass effect with effacement of the sulci of the left cerebral hemisphere and 2 mm left right midline shift. Stable subarachnoid hemorrhage. No interval hemorrhage identified. Ventricular size is normal. Cerebellum is unremarkable. No evidence of acute infarct. New Vascular: No hyperdense vessel or unexpected calcification. Skull: Normal. Negative for fracture or focal lesion. Other: Mastoid air cells and middle ear cavities are clear. CT MAXILLOFACIAL FINDINGS Osseous: No fracture or mandibular dislocation. No destructive process. Orbits: Negative. No traumatic or inflammatory finding. Sinuses: Clear. Soft tissues: Soft tissue swelling superficial to the mandibular mentum noted slightly progressive since prior examination. IMPRESSION: 1. 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 Stable mass effect with effacement of the sulci of the left cerebral hemisphere and 2 mm left right midline shift. 2. Stable subarachnoid hemorrhage. No interval hemorrhage identified. 3. No acute facial bone fracture. 4. Soft tissue swelling superficial to the mandibular mentum, slightly progressive since prior examination. Electronically Signed   By: Dorethia Molt M.D.   On: 11/27/2023 21:36   CT MAXILLOFACIAL WO CONTRAST Result Date: 11/27/2023 CLINICAL DATA:  Facial trauma, blunt; Polytrauma, blunt EXAM: CT HEAD WITHOUT CONTRAST CT MAXILLOFACIAL WITHOUT CONTRAST TECHNIQUE: Multidetector CT imaging of the head and maxillofacial structures were performed using the standard protocol without intravenous contrast. Multiplanar CT image reconstructions of the maxillofacial structures were also generated. RADIATION DOSE REDUCTION: This exam was performed according to the departmental dose-optimization program which includes automated exposure control, adjustment of the mA and/or kV according to patient size and/or use of iterative reconstruction technique. 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 Stable mass effect with effacement of the sulci of the left cerebral hemisphere and 2 mm left right midline shift. Stable subarachnoid hemorrhage. No interval hemorrhage identified. Ventricular size is normal. Cerebellum is unremarkable. No evidence of acute infarct. New Vascular: No hyperdense vessel or unexpected calcification. Skull: Normal. Negative for fracture or focal lesion. Other: Mastoid air cells and middle ear cavities are clear. CT MAXILLOFACIAL FINDINGS Osseous: No fracture or mandibular dislocation. No destructive process. Orbits: Negative. No traumatic or inflammatory finding.  Sinuses: Clear. Soft tissues: Soft tissue swelling superficial to the mandibular mentum noted slightly progressive since prior examination. IMPRESSION: 1. 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 Stable mass effect with effacement of the sulci of the left cerebral hemisphere and 2 mm left right midline shift. 2. Stable subarachnoid hemorrhage. No interval hemorrhage identified. 3. No acute facial bone fracture. 4. Soft tissue swelling superficial to the mandibular mentum, slightly progressive since prior examination. Electronically Signed   By: Dorethia Molt M.D.   On: 11/27/2023 21:36   EEG adult Result Date:  11/27/2023 Shelton Arlin KIDD, MD     11/27/2023 12:45 PM Patient Name: MANNY VITOLO MRN: [id number] Epilepsy Attending: Arlin KIDD Shelton Referring Physician/Provider: Khaliqdina, Salman, MD Date: 11/27/2023 Duration: 22.22 mins Patient history: 53 y.o. male ith hx of chronic EtOh use, GERD, gout, HLD who presented to [facility] ED after fall along with a L facial droop. CT head with  with new edema in the anterior left temporal lobe along with small amount of subarachnoid hemorrhage within a single sulcus of the right frontal lobe and two sulci of the left frontal lobe. EEG to evaluate for seizure Level of alertness: Awake AEDs during EEG study: Phenobarb, ativan  Technical aspects: This EEG study was done with scalp electrodes positioned according to the 10-20 International system of electrode placement. Electrical activity was reviewed with band pass filter of 1-70Hz , sensitivity of 7 uV/mm, display speed of 3mm/sec with a 60Hz  notched filter applied as appropriate. EEG data were recorded continuously and digitally stored.  Video monitoring was available and reviewed as appropriate. Description: The posterior dominant rhythm consists of 8 Hz activity of moderate voltage (25-35 uV)  seen predominantly in posterior head regions, symmetric and reactive to eye opening and eye closing. EEG showed intermittent generalized 3 to 6 Hz theta-delta slowing. Hyperventilation and photic stimulation were not performed.   ABNORMALITY - Intermittent slow, generalized IMPRESSION: This study is suggestive of mild diffuse encephalopathy. No seizures or epileptiform discharges were seen throughout the recording. Priyanka KIDD Shelton     LOS: 2 days   Donalda Applebaum, MD  [facility]    To contact the attending provider between 7A-7P or the covering provider during after hours 7P-7A, please log into the web site [email or web address] and access using universal [place] password for that web site. If you do not have the password, please call the hospital operator.  11/28/2023, 10:16 AM

## 2023-11-28 NOTE — Plan of Care (Signed)
  Problem: Education: Goal: Knowledge of General Education information will improve Description: Including pain rating scale, medication(s)/side effects and non-pharmacologic comfort measures Outcome: Progressing   Problem: Clinical Measurements: Goal: Ability to maintain clinical measurements within normal limits will improve Outcome: Progressing Goal: Will remain free from infection Outcome: Progressing   Problem: Activity: Goal: Risk for activity intolerance will decrease Outcome: Progressing   Problem: Coping: Goal: Level of anxiety will decrease Outcome: Progressing   Problem: Elimination: Goal: Will not experience complications related to urinary retention Outcome: Progressing   Problem: Pain Managment: Goal: General experience of comfort will improve and/or be controlled Outcome: Progressing   Problem: Safety: Goal: Ability to remain free from injury will improve Outcome: Progressing

## 2023-11-28 NOTE — TOC Initial Note (Addendum)
 Transition of Care (TOC) - Initial/Assessment Note   PT/OT recommending OP therapy and 24/7 assistance at home.    Spoke to wife at bedside. Patient does not have 24/7 assistance at home. They are recently separated therefore do not live together . NCM asked if patient has other family /friends. Wife will speak with them. Inpatient Care Management Team will follow up.   No DME recommended.   Patient does not have PCP . Appointment scheduled and on AVS. Patient will need to establish care prior to starting OP PT/OT   Wife asking for charity care paperwork since patient has not worked since March 2025. Financial counselor referral since. However, NCM explained patient will receive a bill about 2 weeks after discharge and there will be a number on bill to call to set up a payment plan / discuss charity care. Wife voiced understanding  Patient Details  Name: Christian King MRN: [id number] Date of Birth: Apr 01, 1970  Transition of Care [facility]) CM/SW Contact:    Stephane Powell Jansky, RN Phone Number: 11/28/2023, 9:32 AM  Clinical Narrative:                   Expected Discharge Plan:  (to be determined) Barriers to Discharge: Continued Medical Work up   Patient Goals and CMS Choice            Expected Discharge Plan and Services In-house Referral: Artist Discharge Planning Services: CM Consult   Living arrangements for the past 2 months: Single Family Home                 DME Arranged: N/A DME Agency: NA       HH Arranged: NA HH Agency: NA        Prior Living Arrangements/Services Living arrangements for the past 2 months: Single Family Home Lives with:: Self Patient language and need for interpreter reviewed:: Yes        Need for Family Participation in Patient Care: Yes (Comment) Care giver support system in place?: No (comment)   Criminal Activity/Legal Involvement Pertinent to Current Situation/Hospitalization: No - Comment as needed  Activities  of Daily Living   ADL Screening (condition at time of admission) Independently performs ADLs?: Yes (appropriate for developmental age) Is the patient deaf or have difficulty hearing?: No Does the patient have difficulty seeing, even when wearing glasses/contacts?: No Does the patient have difficulty concentrating, remembering, or making decisions?: Yes  Permission Sought/Granted   Permission granted to share information with : Yes, Verbal Permission Granted  Share Information with NAME: Ravonda wife           Emotional Assessment Appearance:: Appears stated age Attitude/Demeanor/Rapport: Unable to Assess, Lethargic          Admission diagnosis:  Alcohol withdrawal (HCC) [F10.939] Subarachnoid hemorrhage (HCC) [I60.9] Hypokalemia [E87.6] Hypomagnesemia [E83.42] Fall, initial encounter [W19.XXXA] Alcohol withdrawal syndrome with complication (HCC) [F10.939] Closed fracture of one rib of left side, initial encounter [S22.32XA] Patient Active Problem List   Diagnosis Date Noted   Alcohol withdrawal (HCC) 11/26/2023   Falls, initial encounter 11/26/2023   Subarachnoid hemorrhage (HCC) 11/26/2023   Subdural hematoma (HCC) 11/26/2023   Hypomagnesemia 11/26/2023   Elevated CK 11/26/2023   Elevated troponin 11/26/2023   Substance induced mood disorder (HCC) 07/21/2023   Severe alcohol use disorder (HCC) 07/21/2023   Chronic hypokalemia 07/21/2023   Hyperlipidemia 07/21/2023   Hypokalemia 03/17/2016   Cerebral edema (HCC) 03/20/2015   PCP:  Patient, No Pcp Per Pharmacy:  [facility] [address] [place] [address] [place] [address] Phone: [phone] Fax: [phone]     Social Drivers of Health (SDOH) Social History: SDOH Screenings   Food Insecurity: No Food Insecurity (11/27/2023)  Housing: Low Risk  (11/27/2023)  Transportation Needs: No Transportation Needs (11/27/2023)  Utilities: Not At Risk (11/27/2023)  Alcohol Screen:  High Risk (11/10/2023)  Depression (PHQ2-9): Low Risk  (11/27/2022)  Financial Resource Strain: Low Risk  (11/27/2022)  Physical Activity: Insufficiently Active (11/27/2022)  Social Connections: Unknown (07/21/2023)  Stress: No Stress Concern Present (11/27/2022)  Tobacco Use: High Risk (11/25/2023)   SDOH Interventions:     Readmission Risk Interventions     No data to display

## 2023-11-28 NOTE — Progress Notes (Signed)
 Transition of Care [facility]) - CAGE-AID Screening   Patient Details  Name: KAY RICCIUTI MRN: [id number] Date of Birth: 15-Jul-1970  [facility]) CM/SW Contact:    Sallyanne MALVA Mettle, RN Phone Number: 11/28/2023, 7:17 PM    CAGE-AID Screening: Substance Abuse Screening unable to be completed due to: : Patient unable to participate (altered LOC)

## 2023-11-28 NOTE — Progress Notes (Addendum)
 Speech Language Pathology Treatment: Dysphagia  Patient Details Name: Christian King MRN: [id number] DOB: May 01, 1970 Today's Date: 11/28/2023 Time: [id number] SLP Time Calculation (min) (ACUTE ONLY): 25 min  Assessment / Plan / Recommendation Clinical Impression  Pt seen at bedside to attempt SLE and assess diet tolerance. Pt continues to be mentally altered, so SLE was deferred at this time. Wife present, and reports oral pocketing of solid textures. SLP reiterated importance of feeding pt only when he is fully awake and alert. Wife is concerned about his blood sugar, and really wants him to eat/drink, likely feeding him when he isn't alert enough.   Pt unable to demonstrate ability to masticate a piece of banana during this session. Banana removed from oral cavity. Pt appears to tolerate trials of puree and thin liquid without overt s/s aspiration. Diet downgraded to puree (Dys1) thin liquids with crushed meds providing PO ONLY WHEN PT IS FULLY AWAKE AND ALERT. On BSE completed yesterday, pt appeared safe for PO intake when alert. He is not safe for PO intake if not fully awake and alert, and PO trials should not be attempted.  Recommend consideration of dietician consult for calorie count given poor alertness and subsequent inadequate PO intake. If pt is unable to maintain alertness for safe PO intake, NPO status will be recommended until alertness is consistently adequate.   RN/MD/RD informed of recommendations.    HPI HPI: 53yo male admitted 11/25/23 after a fall, left facial droop. PMH: severe alcohol use disorder, HLD, depression, GERD, HTN, hypothyroidism, insomnia, hip replacements      SLP Plan  Continue with current plan of care Downgrade diet to puree/thin, safe swallow precautions updated.         Recommendations  Diet recommendations: Dysphagia 1 (puree);Thin liquid Liquids provided via: Straw;Cup Medication Administration: Crushed with puree Supervision: Full  supervision/cueing for compensatory strategies;Trained caregiver to feed patient Compensations: Minimize environmental distractions;Slow rate;Small sips/bites Postural Changes and/or Swallow Maneuvers: Seated upright 90 degrees;Upright 30-60 min after meal        Oral care BID     Dysphagia, unspecified (R13.10)     Continue with current plan of care    Asharia Lotter B. Dory, MSP, CCC-SLP Speech Language Pathologist Office: [phone]  Dory Caprice Daring 11/28/2023, 12:13 PM

## 2023-11-29 DIAGNOSIS — E440 Moderate protein-calorie malnutrition: Secondary | ICD-10-CM | POA: Insufficient documentation

## 2023-11-29 DIAGNOSIS — F10939 Alcohol use, unspecified with withdrawal, unspecified: Secondary | ICD-10-CM | POA: Diagnosis not present

## 2023-11-29 DIAGNOSIS — S2232XA Fracture of one rib, left side, initial encounter for closed fracture: Secondary | ICD-10-CM | POA: Diagnosis not present

## 2023-11-29 DIAGNOSIS — E876 Hypokalemia: Secondary | ICD-10-CM | POA: Diagnosis not present

## 2023-11-29 LAB — BASIC METABOLIC PANEL WITH GFR
Anion gap: 16 — ABNORMAL HIGH (ref 5–15)
BUN: <5 mg/dL — ABNORMAL LOW (ref 6–20)
CO2: 22 mmol/L (ref 22–32)
Calcium: 8.8 mg/dL — ABNORMAL LOW (ref 8.9–10.3)
Chloride: 96 mmol/L — ABNORMAL LOW (ref 98–111)
Creatinine, Ser: 0.81 mg/dL (ref 0.61–1.24)
GFR, Estimated: >60 mL/min (ref >60)
Glucose, Bld: 80 mg/dL (ref 70–99)
Potassium: 3.4 mmol/L — ABNORMAL LOW (ref 3.5–5.1)
Sodium: 134 mmol/L — ABNORMAL LOW (ref 135–145)

## 2023-11-29 LAB — GLUCOSE, CAPILLARY
Glucose-Capillary: 112 mg/dL — ABNORMAL HIGH (ref 70–99)
Glucose-Capillary: 128 mg/dL — ABNORMAL HIGH (ref 70–99)
Glucose-Capillary: 73 mg/dL (ref 70–99)
Glucose-Capillary: 77 mg/dL (ref 70–99)
Glucose-Capillary: 79 mg/dL (ref 70–99)
Glucose-Capillary: 85 mg/dL (ref 70–99)

## 2023-11-29 LAB — MAGNESIUM: Magnesium: 1.3 mg/dL — ABNORMAL LOW (ref 1.7–2.4)

## 2023-11-29 MED ORDER — MAGNESIUM SULFATE 4 GM/100ML IV SOLN
4.0000 g | Freq: Once | INTRAVENOUS | Status: AC
  Administered 2023-11-29: 4 g via INTRAVENOUS
  Filled 2023-11-29: qty 100

## 2023-11-29 MED ORDER — POTASSIUM CHLORIDE 10 MEQ/100ML IV SOLN
10.0000 meq | INTRAVENOUS | Status: AC
  Administered 2023-11-29 (×2): 10 meq via INTRAVENOUS
  Filled 2023-11-29 (×2): qty 100

## 2023-11-29 MED ORDER — MELATONIN 5 MG PO TABS
5.0000 mg | ORAL_TABLET | Freq: Every day | ORAL | Status: DC
  Administered 2023-11-29 – 2023-11-30 (×2): 5 mg via ORAL
  Filled 2023-11-29 (×2): qty 1

## 2023-11-29 MED ORDER — OSMOLITE 1.2 CAL PO LIQD
1000.0000 mL | ORAL | Status: DC
  Filled 2023-11-29: qty 1000

## 2023-11-29 MED ORDER — OSMOLITE 1.5 CAL PO LIQD
1000.0000 mL | ORAL | Status: AC
Start: 2023-11-29 — End: ?
  Administered 2023-11-29 – 2023-12-04 (×3): 1000 mL
  Filled 2023-11-29 (×10): qty 1000

## 2023-11-29 MED ORDER — FREE WATER
150.0000 mL | Status: DC
  Administered 2023-11-29 – 2023-12-06 (×41): 150 mL

## 2023-11-29 MED ORDER — LORAZEPAM 2 MG/ML IJ SOLN
1.0000 mg | INTRAMUSCULAR | Status: AC | PRN
  Administered 2023-11-29 – 2023-12-01 (×4): 2 mg via INTRAVENOUS
  Filled 2023-11-29 (×4): qty 1

## 2023-11-29 MED ORDER — PROSOURCE TF20 ENFIT COMPATIBL EN LIQD
60.0000 mL | Freq: Every day | ENTERAL | Status: DC
  Administered 2023-11-29 – 2023-12-06 (×8): 60 mL
  Filled 2023-11-29 (×8): qty 60

## 2023-11-29 MED ORDER — LORAZEPAM 1 MG PO TABS
1.0000 mg | ORAL_TABLET | ORAL | Status: AC | PRN
  Filled 2023-11-29: qty 2

## 2023-11-29 NOTE — Progress Notes (Addendum)
 PROGRESS NOTE        PATIENT DETAILS Name: Christian King Age: 53 y.o. Sex: male Date of Birth: 1970/04/23 Admit Date: 11/25/2023 Admitting Physician Donalda CHRISTELLA Applebaum, MD ERE:[facility], No Pcp Per  Brief Summary: Patient is a 53 y.o.  male with history of EtOH use-numerous falls recently (per spouse-ongoing for the past several months)-presented with left facial droop-upon further evaluation with neuroimaging including MRI brain-patient was found to have findings consistent with traumatic brain injury including multifocal bilateral SAH, bilateral SDH, cerebral contusions with edema/enhancements in the left temporal lobe.  Significant events: 10/6>> presented with facial droop-to [name] 10/7>> transferred to [facility]. 10/8>> mechanical fall after spouse left the room-repeat CT head/maxillofacial-stable. 10/9>>Cortrak tube inserted  Significant studies: 10/6>> x-ray left hand: No fracture 10/6>> x-ray pelvis/right hip: No fracture. 10/6>> CTA head/neck: No LVO/high-grade stenosis. 10/6>> CT C-spine: No acute abnormality of the C-spine 10/6>> CT maxillofacial: No fracture 10/6>> CT chest: Left 10th rib fracture. 10/7>> MRI brain:  multifocal bilateral SAH, bilateral SDH, cerebral contusions with edema/enhancements in the left temporal lobe. 10/8>> EEG: Negative for seizures. 10/8>> CT head: Stable prior SDH versus hygroma-stable SAH. 10/8>> CT maxillofacial: No facial bone fracture.  Significant microbiology data: 10/7>> blood culture: No growth  Procedures: None  Consults: Neurosurgery Neurology  Subjective: Was agitated-attempting to get out of bed last night.  Now in restraints.  Spoke with spouse at bedside-his oral intake is very poor for the past several days-she is agreeable to place a NG tube and begin feeds.  Although somewhat confused-he is answering questions appropriately.  Per spouse-patient was already exhibiting some signs of  confabulation prior to this hospitalization at home.  Objective: Vitals: Blood pressure 128/81, pulse 66, temperature 98.6 F (37 C), temperature source Axillary, resp. rate 18, height 5' 9 (1.753 m), weight 68.9 kg, SpO2 97%.   Exam: Gen Exam: Confused but answers questions appropriately. HEENT:atraumatic, normocephalic Chest: B/L clear to auscultation anteriorly.  Some minimal transmitted upper airway sounds today. CVS:S1S2 regular Abdomen:soft non tender, non distended Extremities:no edema Neurology: Subtle left facial droop-but moving all 4 extremities. Skin: no rash  Pertinent Labs/Radiology:    Latest Ref Rng & Units 11/28/2023    2:20 AM 11/27/2023    2:23 AM 11/26/2023    6:12 AM  CBC  WBC 4.0 - 10.5 K/uL 7.3  6.6  6.1   Hemoglobin 13.0 - 17.0 g/dL 88.1  88.5  89.8   Hematocrit 39.0 - 52.0 % 33.8  34.4  31.1   Platelets 150 - 400 K/uL 189  181  205     Lab Results  Component Value Date   NA 134 (L) 11/29/2023   K 3.4 (L) 11/29/2023   CL 96 (L) 11/29/2023   CO2 22 11/29/2023      Assessment/Plan: TBI with left temporal lobe contusion/edema/bilateral SAH/bilateral SDH Secondary to multiple falls that the patient has sustained in the past several weeks/months-suspect in the context of EtOH intoxication. Appreciate neurosurgical input-no surgical indications Appreciated neurology input-initially there was some suspicion for HSV encephalitis but felt unlikely after MRI brain findings.  No need for LP per neurology. Discussed with trauma service-[name]-PA-C on 10/8-care is mostly supportive-PT/OT/SLP eval. Unfortunately-had another mechanical fall 10/8-has been counseled to let nursing staff know before he gets out of bed.  Discussed with nursing staff-bedside sitter ordered-fall precautions.  Oropharyngeal  dysphagia Has subtle left facial droop-apparently has been struggling with oral intake for the past several days-still accumulating secretions but much better  compared to the past several days Continue dysphagia 1 diet-SLP following Due to poor oral intake-Will go ahead and place Cortrak tube today-begin tube feeds-hopefully this is short-term and we will be able to discontinue it in the next several days once he is better from alcohol withdrawals/encephalopathy issues.  Left 10th rib fracture Supportive care Incentive spirometry/flutter valve  Hypokalemia/hypomagnesemia/hypophosphatemia Secondary to EtOH use Replete/recheck.  Rhabdomyolysis Mild CK levels have now normalized with supportive care.  Acute urinary retention Foley catheter inserted on admit (per flowsheet-around 800 cc residual urine drained) Continue Foley catheter Continue Flomax Voiding trial when is a bit more ambulatory-and a bit more awake/alert.  EtOH withdrawal ?  Hospital delirium More awake but now agitated-attempting to get out of bed Remains on phenobarb taper Add Ativan  per CIWA protocol On high-dose IV thiamine -until 10/12 following which he needs to be switched over to oral thiamine  Discussed with spouse-he was already exhibiting some cognitive/confabulation issues at home prior to this hospitalization-so there is some concern that he could have had Wernicke's encephalopathy/Korsakoff's at baseline. Note-thiamine  levels on 10/7-elevated-likely erroneous as it was obtained after he got several doses of thiamine .  Note-patient tends to underestimate his amount of EtOH use-apparently per spouse-he drinks much more than he acknowledges-and that she actually has separated from him because of his alcoholism.  Nutrition Status: Nutrition Problem: Moderate Malnutrition Etiology: social / environmental circumstances Signs/Symptoms: percent weight loss, moderate muscle depletion, mild fat depletion Percent weight loss: 13.3 % Interventions: Ensure Enlive (each supplement provides 350kcal and 20 grams of protein), MVI, Refer to RD note for recommendations, Calorie  Count    Code status:   Code Status: Full Code   DVT Prophylaxis: SCDs Start: 11/26/23 0338   Family Communication: Spouse at bedside   Disposition Plan: Status is: Inpatient Remains inpatient appropriate because: Severity of illness   Planned Discharge Destination:Home health versus rehab facility   Diet: Diet Order             DIET - DYS 1 Room service appropriate? Yes; Fluid consistency: Thin  Diet effective now                     Antimicrobial agents: Anti-infectives (From admission, onward)    Start     Dose/Rate Route Frequency Ordered Stop   11/26/23 0000  acyclovir (ZOVIRAX) 700 mg in dextrose  5 % 100 mL IVPB  Status:  Discontinued        700 mg 114 mL/hr over 60 Minutes Intravenous Every 8 hours 11/25/23 2334 11/27/23 0346        MEDICATIONS: Scheduled Meds:  Chlorhexidine  Gluconate Cloth  6 each Topical Daily   feeding supplement (PROSource TF20)  60 mL Per Tube Daily   folic acid   1 mg Oral Daily   lidocaine   1 patch Transdermal Q24H   melatonin  5 mg Oral QHS   multivitamin with minerals  1 tablet Oral Daily   PHENObarbital  32.4 mg Intravenous Q8H   scopolamine  1 patch Transdermal Q72H   tamsulosin  0.4 mg Oral Daily   [START ON 12/02/2023] thiamine  (VITAMIN B1) injection  100 mg Intravenous Daily   Continuous Infusions:  feeding supplement (OSMOLITE 1.2 CAL)     feeding supplement (OSMOLITE 1.5 CAL)     thiamine  (VITAMIN B1) injection 500 mg (11/28/23 1307)   PRN Meds:.acetaminophen  **OR** acetaminophen ,  dextrose , HYDROmorphone  (DILAUDID ) injection, LORazepam  **OR** LORazepam , ondansetron  **OR** ondansetron  (ZOFRAN ) IV   I have personally reviewed following labs and imaging studies  LABORATORY DATA: CBC: Recent Labs  Lab 11/25/23 2044 11/25/23 2110 11/26/23 0612 11/27/23 0223 11/28/23 0220  WBC 7.7  --  6.1 6.6 7.3  NEUTROABS 6.7  --   --   --   --   HGB 12.2* 11.9* 10.1* 11.4* 11.8*  HCT 36.1* 35.0* 31.1* 34.4* 33.8*   MCV 106.8*  --  108.0* 105.5* 102.4*  PLT 277  --  205 181 189    Basic Metabolic Panel: Recent Labs  Lab 11/26/23 0102 11/26/23 0612 11/27/23 0223 11/27/23 0736 11/28/23 0220 11/29/23 0246  NA  --  136 134* 132* 133* 134*  K  --  3.1* 3.2* 2.9* 3.3* 3.4*  CL  --  99 98 97* 97* 96*  CO2  --  18* 23 24 21* 22  GLUCOSE  --  73 163* 82 95 80  BUN  --  <5* <5* <5* <5* <5*  CREATININE  --  0.67 0.70 0.64 0.68 0.81  CALCIUM   --  8.0* 7.6* 7.7* 8.4* 8.8*  MG 1.6*  --  1.6* 1.4* 1.6* 1.3*  PHOS 2.3*  --   --  1.9* 3.2  --     GFR: Estimated Creatinine Clearance: 100.4 mL/min (by C-G formula based on SCr of 0.81 mg/dL).  Liver Function Tests: Recent Labs  Lab 11/25/23 2044 11/26/23 0612 11/27/23 0223  AST 47* 33 20  ALT 23 16 14   ALKPHOS 63 47 40  BILITOT 1.0 0.7 1.1  PROT 7.1 5.5* 5.4*  ALBUMIN 4.1 3.1* 2.3*   No results for input(s): LIPASE, AMYLASE in the last 168 hours. Recent Labs  Lab 11/25/23 2105 11/26/23 1806  AMMONIA <13 36*    Coagulation Profile: Recent Labs  Lab 11/25/23 2044  INR 0.9    Cardiac Enzymes: Recent Labs  Lab 11/25/23 2044 11/27/23 0223  CKTOTAL 902* 395    BNP (last 3 results) No results for input(s): PROBNP in the last 8760 hours.  Lipid Profile: No results for input(s): CHOL, HDL, LDLCALC, TRIG, CHOLHDL, LDLDIRECT in the last 72 hours.  Thyroid  Function Tests: No results for input(s): TSH, T4TOTAL, FREET4, T3FREE, THYROIDAB in the last 72 hours.  Anemia Panel: No results for input(s): VITAMINB12, FOLATE, FERRITIN, TIBC, IRON, RETICCTPCT in the last 72 hours.  Urine analysis:    Component Value Date/Time   COLORURINE STRAW (A) 05/22/2020 0044   APPEARANCEUR CLEAR 05/22/2020 0044   LABSPEC 1.009 05/22/2020 0044   PHURINE 6.0 05/22/2020 0044   GLUCOSEU NEGATIVE 05/22/2020 0044   HGBUR SMALL (A) 05/22/2020 0044   BILIRUBINUR NEGATIVE 05/22/2020 0044   KETONESUR NEGATIVE  05/22/2020 0044   PROTEINUR NEGATIVE 05/22/2020 0044   NITRITE NEGATIVE 05/22/2020 0044   LEUKOCYTESUR NEGATIVE 05/22/2020 0044    Sepsis Labs: Lactic Acid, Venous    Component Value Date/Time   LATICACIDVEN 2.2 (HH) 05/23/2020 1216    MICROBIOLOGY: Recent Results (from the past 240 hours)  Blood culture (routine x 2)     Status: None (Preliminary result)   Collection Time: 11/26/23 12:59 AM   Specimen: BLOOD  Result Value Ref Range Status   Specimen Description BLOOD BLOOD LEFT ARM  Final   Special Requests   Final    BOTTLES DRAWN AEROBIC AND ANAEROBIC Blood Culture adequate volume   Culture   Final    NO GROWTH 3 DAYS Performed at [facility], [address] [place], [place] [address]    Report Status PENDING  Incomplete  Blood culture (routine x 2)     Status: None (Preliminary result)   Collection Time: 11/26/23  1:02 AM   Specimen: BLOOD  Result Value Ref Range Status   Specimen Description BLOOD BLOOD LEFT HAND  Final   Special Requests AEROBIC BOTTLE ONLY Blood Culture adequate volume  Final   Culture   Final    NO GROWTH 3 DAYS Performed at [facility], [address], [place], [place] [address]    Report Status PENDING  Incomplete    RADIOLOGY STUDIES/RESULTS: CT HEAD WO CONTRAST ( ) Result Date: 11/27/2023 CLINICAL DATA:  Facial trauma, blunt; Polytrauma, blunt EXAM: CT HEAD WITHOUT CONTRAST CT MAXILLOFACIAL WITHOUT CONTRAST TECHNIQUE: Multidetector CT imaging of the head and maxillofacial structures were performed using the standard protocol without intravenous contrast. Multiplanar CT image reconstructions of the maxillofacial structures were also generated. RADIATION DOSE REDUCTION: This exam was performed according to the departmental dose-optimization program which includes automated exposure control, adjustment of the mA and/or kV according to patient size and/or use of iterative reconstruction technique. 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 Stable mass effect with effacement of the sulci of the left cerebral hemisphere and 2 mm left right midline shift. Stable subarachnoid hemorrhage. No interval hemorrhage identified. Ventricular size is normal. Cerebellum is unremarkable. No evidence of acute infarct. New Vascular: No hyperdense vessel or unexpected calcification. Skull: Normal. Negative for fracture or focal lesion. Other: Mastoid air cells and middle ear cavities are clear. CT MAXILLOFACIAL FINDINGS Osseous: No fracture or mandibular dislocation. No destructive process. Orbits: Negative. No traumatic or inflammatory finding. Sinuses: Clear. Soft tissues: Soft tissue swelling superficial to the mandibular mentum noted slightly progressive since prior examination. IMPRESSION: 1. 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 Stable mass effect with effacement of the sulci of the left cerebral hemisphere and 2 mm left right midline shift. 2. Stable subarachnoid hemorrhage. No interval hemorrhage identified. 3. No acute facial bone fracture. 4. Soft tissue swelling superficial to the mandibular mentum, slightly progressive since prior examination. Electronically Signed   By: Dorethia Molt M.D.   On: 11/27/2023 21:36   CT MAXILLOFACIAL WO CONTRAST Result Date: 11/27/2023 CLINICAL DATA:  Facial trauma, blunt; Polytrauma, blunt EXAM: CT HEAD WITHOUT CONTRAST CT MAXILLOFACIAL WITHOUT CONTRAST TECHNIQUE: Multidetector CT imaging of the head and maxillofacial structures were performed using the standard protocol without intravenous contrast. Multiplanar CT image reconstructions of the maxillofacial structures were also generated. RADIATION DOSE REDUCTION: This exam was performed  according to the departmental dose-optimization program which includes automated exposure control, adjustment of the mA and/or kV according to patient size and/or use of iterative reconstruction technique. 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 Stable mass effect with effacement of the sulci of the left cerebral hemisphere and 2 mm left right midline shift. Stable subarachnoid hemorrhage. No interval hemorrhage identified. Ventricular size is normal. Cerebellum is unremarkable. No evidence of acute infarct. New Vascular: No hyperdense vessel or unexpected calcification. Skull: Normal. Negative for fracture or focal lesion. Other: Mastoid air cells and middle ear cavities are  clear. CT MAXILLOFACIAL FINDINGS Osseous: No fracture or mandibular dislocation. No destructive process. Orbits: Negative. No traumatic or inflammatory finding. Sinuses: Clear. Soft tissues: Soft tissue swelling superficial to the mandibular mentum noted slightly progressive since prior examination. IMPRESSION: 1. 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 Stable mass effect with effacement of the sulci of the left cerebral hemisphere and 2 mm left right midline shift. 2. Stable subarachnoid hemorrhage. No interval hemorrhage identified. 3. No acute facial bone fracture. 4. Soft tissue swelling superficial to the mandibular mentum, slightly progressive since prior examination. Electronically Signed   By: Dorethia Molt M.D.   On: 11/27/2023 21:36   EEG adult Result Date: 11/27/2023 Shelton Arlin KIDD, MD     11/29/2023  8:37 AM Patient Name: JACEY PELC MRN: [id number] Epilepsy Attending: Arlin KIDD Shelton Referring Physician/Provider: Khaliqdina,  Salman, MD Date: 11/27/2023 Duration: 22.22 mins Patient history: 53 y.o. male ith hx of chronic EtOh use, GERD, gout, HLD who presented to [facility] ED after fall along with a L facial droop. CT head with  with new edema in the anterior left temporal lobe along with small amount of subarachnoid hemorrhage within a single sulcus of the right frontal lobe and two sulci of the left frontal lobe. EEG to evaluate for seizure Level of alertness: Awake AEDs during EEG study: Phenobarb, ativan  Technical aspects: This EEG study was done with scalp electrodes positioned according to the 10-20 International system of electrode placement. Electrical activity was reviewed with band pass filter of 1-70Hz , sensitivity of 7 uV/mm, display speed of 26mm/sec with a 60Hz  notched filter applied as appropriate. EEG data were recorded continuously and digitally stored.  Video monitoring was available and reviewed as appropriate. Description: The posterior dominant rhythm consists of 8 Hz activity of moderate voltage (25-35 uV) seen predominantly in posterior head regions, symmetric and reactive to eye opening and eye closing. EEG showed intermittent generalized 3 to 6 Hz theta-delta slowing. Hyperventilation and photic stimulation were not performed.   ABNORMALITY - Intermittent slow, generalized IMPRESSION: This study is suggestive of mild diffuse encephalopathy. No seizures or epileptiform discharges were seen throughout the recording. Priyanka KIDD Shelton     LOS: 3 days   Donalda Applebaum, MD  [facility]    To contact the attending provider between 7A-7P or the covering provider during after hours 7P-7A, please log into the web site [email or web address] and access using universal Boyd password for that web site. If you do not have the password, please call the hospital operator.  11/29/2023, 11:19 AM

## 2023-11-29 NOTE — Progress Notes (Signed)
 OT Cancellation Note  Patient Details Name: Christian King MRN: [id number] DOB: 1970/09/21   Cancelled Treatment:    Reason Eval/Treat Not Completed: Medical issues which prohibited therapy (Per RN, pt with increased agitation and decreased mentation with pt not appropriate for participation in OT treat at this time. OT to reattempt to see pt at a later time as appropriate/available.)  Margarie Rockey HERO., OTR/L, MA Acute Rehab [phone]   Margarie FORBES Horns 11/29/2023, 1:55 PM

## 2023-11-29 NOTE — Progress Notes (Signed)
 Speech Language Pathology Treatment: Dysphagia  Patient Details Name: Christian King MRN: [id number] DOB: 07/06/1970 Today's Date: 11/29/2023 Time: 8887-8876 SLP Time Calculation (min) (ACUTE ONLY): 11 min  Assessment / Plan / Recommendation Clinical Impression  Pt's overall condition and mentation has decreased since yesterday. Wife and nurses present (repositioning). He is agitated, resisting restraints placed, speech mostly incoherent, very restless and unable to sustain attention to therapist and not presently safe to have po's; Cortrak placed this morning. Wife stated she tried to give some applesauce yesterday when alert. Pt allowed oral suction for hygiene and removed mild amount moist mucous in posterior cavity. Educated on plan for NPO (MD in agreement). Wife asked if she could use swabs and provided education to wet swab, squeezing excess water and to call RN if he need oral suction with [place]. Reiterated with wife to not attempt po's with pt. ST will continue to follow. Have orders for SLE and will initiate once appropriate.    HPI HPI: 53yo male admitted 11/25/23 after a fall, left facial droop, with bilateral SAH and SDH. PMH: severe alcohol use disorder, HLD, depression, GERD, HTN, hypothyroidism, insomnia, hip replacements      SLP Plan  Continue with current plan of care          Recommendations  Diet recommendations: NPO Medication Administration: Via alternative means                  Oral care QID   Frequent or constant Supervision/Assistance Dysphagia, unspecified (R13.10)     Continue with current plan of care     Dustin Olam Bull  11/29/2023, 11:46 AM

## 2023-11-29 NOTE — Plan of Care (Signed)
 ?  Problem: Clinical Measurements: ?Goal: Ability to maintain clinical measurements within normal limits will improve ?Outcome: Progressing ?Goal: Will remain free from infection ?Outcome: Progressing ?Goal: Diagnostic test results will improve ?Outcome: Progressing ?  ?

## 2023-11-29 NOTE — Progress Notes (Addendum)
 Nutrition Brief Note  Received consult to initiate nutrition support. Full assessment completed yesterday. Orders entered for initiation when Cortrak placed. Will likely only need short term nutrition support until mentation improves. Calorie count discontinued as SLP advises he will be made NPO today d/t AMS.  INTERVENTION:  Recommend short-term nutrition support until mentation improves Once access obtained, initiate tube feeding via Cortrak: Osmolite 1.5 at 55 ml/h (1320 ml per day) Initiate at 25ml/hr and increase by 10ml/hr q8h until goal rate achieved Prosource TF20 60 ml 1x daily FWF 150 ml q4h (900 ml per day)   Provides 2060 kcal, 103 gm protein, 1906 ml free water daily    Discontinue calorie count as patient now NPO Continue thiamine /folic acid ; add MVI w/ minerals r/t EtOH use Continue to monitor magnesium , potassium, and phosphorus daily for at least 3 days, MD to replete as needed Monitor bowels and the need for modification of current bowel regimen Monitor diet advancement/tolerance  Add Ensure Plus High Protein po BID, each supplement provides 350 kcal and 20 grams of protein      NUTRITION DIAGNOSIS:  Moderate Malnutrition related to social / environmental circumstances as evidenced by percent weight loss, moderate muscle depletion, mild fat depletion. - remains applicable   GOAL:  Patient will meet greater than or equal to 90% of their needs - to be met via TF regimen at goal rate  Blair Deaner MS, RD, LDN Registered Dietitian Clinical Nutrition RD Inpatient Contact Info in Amion

## 2023-11-29 NOTE — Procedures (Signed)
 Cortrak  Person Inserting Tube:  Salinda Snedeker, Olivia SAUNDERS, RD Tube Type:  Cortrak - 43 inches Tube Size:  10 Tube Location:  Left nare Secured by: Bridle Initial Placement:  Gastric Technique Used to Measure Tube Placement:  Marking at nare/corner of mouth Cortrak Secured At:  78 cm Initial Placement Verification:  Cortrak device (Registered Dieticians Only)  Cortrak Tube Team Note:  Consult received to place a Cortrak feeding tube.   No x-ray is required. RN may begin using tube.   If the tube becomes dislodged please keep the tube and contact the Cortrak team at [email or web address] for replacement.  If after hours and replacement cannot be delayed, place a NG tube and confirm placement with an abdominal x-ray.   Olivia Kenning, RD Registered Dietitian  See Amion for more information

## 2023-11-29 NOTE — Progress Notes (Signed)
 PT Cancellation Note  Patient Details Name: Christian [facility] MRN: [id number] DOB: [birth year]   Cancelled Treatment:    Reason Eval/Treat Not Completed: Medical issues which prohibited therapy (Per RN, pt has medically declined since last visit and has been agitated and put in restraints. Requests PT and OT to hold for today.)   Randol Zumstein 11/29/2023, 1:56 PM

## 2023-11-30 ENCOUNTER — Inpatient Hospital Stay (HOSPITAL_COMMUNITY)

## 2023-11-30 DIAGNOSIS — E876 Hypokalemia: Secondary | ICD-10-CM | POA: Diagnosis not present

## 2023-11-30 DIAGNOSIS — R0602 Shortness of breath: Secondary | ICD-10-CM | POA: Diagnosis not present

## 2023-11-30 DIAGNOSIS — S2232XA Fracture of one rib, left side, initial encounter for closed fracture: Secondary | ICD-10-CM | POA: Diagnosis not present

## 2023-11-30 DIAGNOSIS — Z4682 Encounter for fitting and adjustment of non-vascular catheter: Secondary | ICD-10-CM | POA: Diagnosis not present

## 2023-11-30 DIAGNOSIS — F10939 Alcohol use, unspecified with withdrawal, unspecified: Secondary | ICD-10-CM | POA: Diagnosis not present

## 2023-11-30 DIAGNOSIS — S2242XA Multiple fractures of ribs, left side, initial encounter for closed fracture: Secondary | ICD-10-CM | POA: Diagnosis not present

## 2023-11-30 LAB — GLUCOSE, CAPILLARY
Glucose-Capillary: 125 mg/dL — ABNORMAL HIGH (ref 70–99)
Glucose-Capillary: 125 mg/dL — ABNORMAL HIGH (ref 70–99)
Glucose-Capillary: 126 mg/dL — ABNORMAL HIGH (ref 70–99)
Glucose-Capillary: 134 mg/dL — ABNORMAL HIGH (ref 70–99)
Glucose-Capillary: 138 mg/dL — ABNORMAL HIGH (ref 70–99)
Glucose-Capillary: 141 mg/dL — ABNORMAL HIGH (ref 70–99)

## 2023-11-30 LAB — MAGNESIUM: Magnesium: 1.4 mg/dL — ABNORMAL LOW (ref 1.7–2.4)

## 2023-11-30 LAB — CBC
HCT: 33.5 % — ABNORMAL LOW (ref 39.0–52.0)
Hemoglobin: 11.7 g/dL — ABNORMAL LOW (ref 13.0–17.0)
MCH: 35.2 pg — ABNORMAL HIGH (ref 26.0–34.0)
MCHC: 34.9 g/dL (ref 30.0–36.0)
MCV: 100.9 fL — ABNORMAL HIGH (ref 80.0–100.0)
Platelets: 195 K/uL (ref 150–400)
RBC: 3.32 MIL/uL — ABNORMAL LOW (ref 4.22–5.81)
RDW: 12.8 % (ref 11.5–15.5)
WBC: 5.5 K/uL (ref 4.0–10.5)
nRBC: 0 % (ref 0.0–0.2)

## 2023-11-30 LAB — BASIC METABOLIC PANEL WITH GFR
Anion gap: 12 (ref 5–15)
BUN: <5 mg/dL — ABNORMAL LOW (ref 6–20)
CO2: 26 mmol/L (ref 22–32)
Calcium: 9 mg/dL (ref 8.9–10.3)
Chloride: 95 mmol/L — ABNORMAL LOW (ref 98–111)
Creatinine, Ser: 0.57 mg/dL — ABNORMAL LOW (ref 0.61–1.24)
GFR, Estimated: >60 mL/min (ref >60)
Glucose, Bld: 120 mg/dL — ABNORMAL HIGH (ref 70–99)
Potassium: 3.2 mmol/L — ABNORMAL LOW (ref 3.5–5.1)
Sodium: 133 mmol/L — ABNORMAL LOW (ref 135–145)

## 2023-11-30 LAB — PROCALCITONIN: Procalcitonin: <0.1 ng/mL

## 2023-11-30 LAB — AMMONIA: Ammonia: 38 umol/L — ABNORMAL HIGH (ref 9–35)

## 2023-11-30 LAB — PHOSPHORUS: Phosphorus: 3 mg/dL (ref 2.5–4.6)

## 2023-11-30 MED ORDER — POTASSIUM CHLORIDE 20 MEQ PO PACK
40.0000 meq | PACK | Freq: Four times a day (QID) | ORAL | Status: AC
  Administered 2023-11-30 (×2): 40 meq
  Filled 2023-11-30 (×2): qty 2

## 2023-11-30 MED ORDER — POTASSIUM CHLORIDE 20 MEQ PO PACK
40.0000 meq | PACK | Freq: Four times a day (QID) | ORAL | Status: DC
Start: 2023-11-30 — End: 2023-11-30

## 2023-11-30 MED ORDER — QUETIAPINE FUMARATE 25 MG PO TABS
25.0000 mg | ORAL_TABLET | Freq: Every day | ORAL | Status: DC
  Administered 2023-11-30: 25 mg
  Filled 2023-11-30: qty 1

## 2023-11-30 MED ORDER — MAGNESIUM SULFATE 4 GM/100ML IV SOLN
4.0000 g | Freq: Once | INTRAVENOUS | Status: AC
  Administered 2023-11-30: 4 g via INTRAVENOUS
  Filled 2023-11-30: qty 100

## 2023-11-30 NOTE — Progress Notes (Signed)
 PROGRESS NOTE        PATIENT DETAILS Name: Christian King Age: 53 y.o. Sex: male Date of Birth: 12-01-1970 Admit Date: 11/25/2023 Admitting Physician Christian CHRISTELLA Applebaum, MD ERE:[facility], No Pcp Per  Brief Summary: Patient is a 53 y.o.  male with history of EtOH use-numerous falls recently (per spouse-ongoing for the past several months)-presented with left facial droop-upon further evaluation with neuroimaging including MRI brain-patient was found to have findings consistent with traumatic brain injury including multifocal bilateral SAH, bilateral SDH, cerebral contusions with edema/enhancements in the left temporal lobe.  Significant events: 10/6>> presented with facial droop-to [facility] 10/7>> transferred to [facility] 10/8>> mechanical fall after spouse left the room-repeat CT head/maxillofacial-stable. 10/9>>Cortrak tube inserted  Significant studies: 10/6>> x-ray left hand: No fracture 10/6>> x-ray pelvis/right hip: No fracture. 10/6>> CTA head/neck: No LVO/high-grade stenosis. 10/6>> CT C-spine: No acute abnormality of the C-spine 10/6>> CT maxillofacial: No fracture 10/6>> CT chest: Left 10th rib fracture. 10/7>> MRI brain:  multifocal bilateral SAH, bilateral SDH, cerebral contusions with edema/enhancements in the left temporal lobe. 10/8>> EEG: Negative for seizures. 10/8>> CT head: Stable prior SDH versus hygroma-stable SAH. 10/8>> CT maxillofacial: No facial bone fracture.  Significant microbiology data: 10/7>> blood culture: No growth  Procedures: None  Consults: Neurosurgery Neurology  Subjective: Uneventful night-sleeping this morning-easily arouses-moves lower extremity/upper extremities on command.  Had attempted to get out of bed yesterday afternoon but since then has been relatively calm/quiet.  Still in restraints-due to concern that he may pull out his NG tube.  Objective: Vitals: Blood pressure 123/73, pulse 81, temperature  99.1 F (37.3 C), temperature source Axillary, resp. rate 19, height 5' 9 (1.753 m), weight 68.8 kg, SpO2 93%.   Exam: Gen Exam: Awake-not in any distress-slightly confused but following commands. HEENT:atraumatic, normocephalic Chest: B/L clear to auscultation anteriorly CVS:S1S2 regular Abdomen:soft non tender, non distended Extremities:no edema Neurology: Focal exam-but seems to be moving all 4 extremities. Skin: no rash  Pertinent Labs/Radiology:    Latest Ref Rng & Units 11/30/2023    5:33 AM 11/28/2023    2:20 AM 11/27/2023    2:23 AM  CBC  WBC 4.0 - 10.5 K/uL 5.5  7.3  6.6   Hemoglobin 13.0 - 17.0 g/dL 88.2  88.1  88.5   Hematocrit 39.0 - 52.0 % 33.5  33.8  34.4   Platelets 150 - 400 K/uL 195  189  181     Lab Results  Component Value Date   NA 133 (L) 11/30/2023   K 3.2 (L) 11/30/2023   CL 95 (L) 11/30/2023   CO2 26 11/30/2023      Assessment/Plan: TBI with left temporal lobe contusion/edema/bilateral SAH/bilateral SDH Secondary to multiple falls that the patient has sustained in the past several weeks/months-suspect in the context of EtOH intoxication. Appreciate neurosurgical input-no surgical indications Appreciated neurology input-initially there was some suspicion for HSV encephalitis but felt unlikely after MRI brain findings.  No need for LP per neurology. Discussed with trauma service-[facility]-PA-C on 10/8-care is mostly supportive-PT/OT/SLP eval. Unfortunately-had another mechanical fall 10/8-has been counseled to let nursing staff know before he gets out of bed.  Discussed with nursing staff-bedside sitter ordered-fall precautions.  Oropharyngeal dysphagia Likely secondary to above-continues to have a subtle left facial droop. Followed closely by SLP-now n.p.o.-previously placed on dysphagia 1 diet Significant less accumulation of  secretions over the past several days  Left 10th rib fracture Supportive care Incentive spirometry/flutter  valve  Hypokalemia/hypomagnesemia/hypophosphatemia Secondary to EtOH use Continue to replete/recheck.  Rhabdomyolysis Mild CK levels have now normalized with supportive care.  Acute urinary retention Foley catheter inserted on admit (per flowsheet-around 800 cc residual urine drained) Continue Foley catheter Continue Flomax Voiding trial when is a bit more ambulatory-and a bit more awake/alert.  EtOH withdrawal ?  Hospital delirium Should be in the latter stages of EtOH withdrawal at this point Phenobarbital taper to complete today-subsequently will start him on low-dose Seroquel nightly. Remains on Ativan  per [facility]WA protocol On high-dose IV thiamine -until 10/12 following which he needs to be switched over to oral thiamine  Discussed with spouse-he was already exhibiting some cognitive/confabulation issues at home prior to this hospitalization-so there is some concern that he could have had Wernicke's encephalopathy/Korsakoff's at baseline. Note-thiamine  levels on 10/7-elevated-likely erroneous as it was obtained after he got several doses of thiamine . If mental status does not improve in the next several days-May need repeat EEG.  Note-patient tends to underestimate his amount of EtOH use-apparently per spouse-he drinks much more than he acknowledges-and that she actually has separated from him because of his alcoholism.  Nutrition Status: Nutrition Problem: Moderate Malnutrition Etiology: social / environmental circumstances Signs/Symptoms: percent weight loss, moderate muscle depletion, mild fat depletion Percent weight loss: 13.3 % Interventions: Ensure Enlive (each supplement provides 350kcal and 20 grams of protein), MVI, Refer to RD note for recommendations, Calorie Count    Code status:   Code Status: Full Code   DVT Prophylaxis: SCDs Start: 11/26/23 0338   Family Communication: Spouse at bedside   Disposition Plan: Status is: Inpatient Remains inpatient appropriate  because: Severity of illness   Planned Discharge Destination:Home health versus rehab facility   Diet: Diet Order             Diet NPO time specified  Diet effective now                     Antimicrobial agents: Anti-infectives (From admission, onward)    Start     Dose/Rate Route Frequency Ordered Stop   11/26/23 0000  acyclovir (ZOVIRAX) 700 mg in dextrose  5 % 100 mL IVPB  Status:  Discontinued        700 mg 114 mL/hr over 60 Minutes Intravenous Every 8 hours 11/25/23 2334 11/27/23 0346        MEDICATIONS: Scheduled Meds:  Chlorhexidine  Gluconate Cloth  6 each Topical Daily   feeding supplement (PROSource TF20)  60 mL Per Tube Daily   folic acid   1 mg Oral Daily   free water  150 mL Per Tube Q4H   lidocaine   1 patch Transdermal Q24H   melatonin  5 mg Oral QHS   multivitamin with minerals  1 tablet Oral Daily   PHENObarbital  32.4 mg Intravenous Q8H   scopolamine  1 patch Transdermal Q72H   tamsulosin  0.4 mg Oral Daily   [START ON 12/02/2023] thiamine  (VITAMIN B1) injection  100 mg Intravenous Daily   Continuous Infusions:  feeding supplement (OSMOLITE 1.5 CAL) 55 mL/hr at 11/30/23 0945   thiamine  (VITAMIN B1) injection 500 mg (11/30/23 1000)   PRN Meds:.acetaminophen  **OR** acetaminophen , dextrose , HYDROmorphone  (DILAUDID ) injection, LORazepam  **OR** LORazepam , ondansetron  **OR** ondansetron  (ZOFRAN ) IV   I have personally reviewed following labs and imaging studies  LABORATORY DATA: CBC: Recent Labs  Lab 11/25/23 2044 11/25/23 2110 11/26/23 0612 11/27/23 0223 11/28/23 0220  11/30/23 0533  WBC 7.7  --  6.1 6.6 7.3 5.5  NEUTROABS 6.7  --   --   --   --   --   HGB 12.2* 11.9* 10.1* 11.4* 11.8* 11.7*  HCT 36.1* 35.0* 31.1* 34.4* 33.8* 33.5*  MCV 106.8*  --  108.0* 105.5* 102.4* 100.9*  PLT 277  --  205 181 189 195    Basic Metabolic Panel: Recent Labs  Lab 11/26/23 0102 11/26/23 0612 11/27/23 0223 11/27/23 0736 11/28/23 0220 11/29/23 0246  11/30/23 0533  NA  --    < > 134* 132* 133* 134* 133*  K  --    < > 3.2* 2.9* 3.3* 3.4* 3.2*  CL  --    < > 98 97* 97* 96* 95*  CO2  --    < > 23 24 21* 22 26  GLUCOSE  --    < > 163* 82 95 80 120*  BUN  --    < > <5* <5* <5* <5* <5*  CREATININE  --    < > 0.70 0.64 0.68 0.81 0.57*  CALCIUM   --    < > 7.6* 7.7* 8.4* 8.8* 9.0  MG 1.6*  --  1.6* 1.4* 1.6* 1.3* 1.4*  PHOS 2.3*  --   --  1.9* 3.2  --  3.0   < > = values in this interval not displayed.    GFR: Estimated Creatinine Clearance: 101.5 mL/min (A) (by C-G formula based on SCr of 0.57 mg/dL (L)).  Liver Function Tests: Recent Labs  Lab 11/25/23 2044 11/26/23 0612 11/27/23 0223  AST 47* 33 20  ALT 23 16 14   ALKPHOS 63 47 40  BILITOT 1.0 0.7 1.1  PROT 7.1 5.5* 5.4*  ALBUMIN 4.1 3.1* 2.3*   No results for input(s): LIPASE, AMYLASE in the last 168 hours. Recent Labs  Lab 11/25/23 2105 11/26/23 1806 11/30/23 0533  AMMONIA <13 36* 38*    Coagulation Profile: Recent Labs  Lab 11/25/23 2044  INR 0.9    Cardiac Enzymes: Recent Labs  Lab 11/25/23 2044 11/27/23 0223  CKTOTAL 902* 395    BNP (last 3 results) No results for input(s): PROBNP in the last 8760 hours.  Lipid Profile: No results for input(s): CHOL, HDL, LDLCALC, TRIG, CHOLHDL, LDLDIRECT in the last 72 hours.  Thyroid  Function Tests: No results for input(s): TSH, T4TOTAL, FREET4, T3FREE, THYROIDAB in the last 72 hours.  Anemia Panel: No results for input(s): VITAMINB12, FOLATE, FERRITIN, TIBC, IRON, RETICCTPCT in the last 72 hours.  Urine analysis:    Component Value Date/Time   COLORURINE STRAW (A) 05/22/2020 0044   APPEARANCEUR CLEAR 05/22/2020 0044   LABSPEC 1.009 05/22/2020 0044   PHURINE 6.0 05/22/2020 0044   GLUCOSEU NEGATIVE 05/22/2020 0044   HGBUR SMALL (A) 05/22/2020 0044   BILIRUBINUR NEGATIVE 05/22/2020 0044   KETONESUR NEGATIVE 05/22/2020 0044   PROTEINUR NEGATIVE 05/22/2020 0044    NITRITE NEGATIVE 05/22/2020 0044   LEUKOCYTESUR NEGATIVE 05/22/2020 0044    Sepsis Labs: Lactic Acid, Venous    Component Value Date/Time   LATICACIDVEN 2.2 (HH) 05/23/2020 1216    MICROBIOLOGY: Recent Results (from the past 240 hours)  Blood culture (routine x 2)     Status: None (Preliminary result)   Collection Time: 11/26/23 12:59 AM   Specimen: BLOOD  Result Value Ref Range Status   Specimen Description BLOOD BLOOD LEFT ARM  Final   Special Requests   Final    BOTTLES DRAWN AEROBIC AND ANAEROBIC  Blood Culture adequate volume   Culture   Final    NO GROWTH 4 DAYS Performed at [facility], [address] [place], [place] [address]    Report Status PENDING  Incomplete  Blood culture (routine x 2)     Status: None (Preliminary result)   Collection Time: 11/26/23  1:02 AM   Specimen: BLOOD  Result Value Ref Range Status   Specimen Description BLOOD BLOOD LEFT HAND  Final   Special Requests AEROBIC BOTTLE ONLY Blood Culture adequate volume  Final   Culture   Final    NO GROWTH 4 DAYS Performed at [facility], [address], [place], [place] [address]    Report Status PENDING  Incomplete    RADIOLOGY STUDIES/RESULTS: DG Chest Port 1 View Result Date: 11/30/2023 EXAM: 1 VIEW(S) XRAY OF THE CHEST 11/30/2023 07:00:00 AM COMPARISON: 11/10/2023 and 11/25/2023 CLINICAL HISTORY: SOB (shortness of breath) FINDINGS: LINES, TUBES AND DEVICES: Enteric tube in place below diaphragm with tip collimated off view. LUNGS AND PLEURA: No focal pulmonary opacity. No pulmonary edema. No pleural effusion. No pneumothorax. HEART AND MEDIASTINUM: No acute abnormality of the cardiac and mediastinal silhouettes. BONES AND SOFT TISSUES: Suboptimal visualization of recently noted left 10th rib fractures. IMPRESSION: 1. No acute cardiopulmonary process detected. 2. Enteric tube present with which courses below the level of the hemidiaphragms. Tip not visualized. Electronically signed by: Waddell Calk  MD 11/30/2023 07:45 AM EDT RP Workstation: [id number]     LOS: 4 days   Christian Applebaum, MD  [facility]    To contact the attending provider between 7A-7P or the covering provider during after hours 7P-7A, please log into the web site [email or web address] and access using universal [place] password for that web site. If you do not have the password, please call the hospital operator.  11/30/2023, 10:43 AM

## 2023-11-30 NOTE — Plan of Care (Signed)

## 2023-11-30 NOTE — Plan of Care (Signed)
  Problem: Education: Goal: Knowledge of General Education information will improve Description: Including pain rating scale, medication(s)/side effects and non-pharmacologic comfort measures Outcome: Progressing   Problem: Health Behavior/Discharge Planning: Goal: Ability to manage health-related needs will improve Outcome: Progressing   Problem: Clinical Measurements: Goal: Ability to maintain clinical measurements within normal limits will improve Outcome: Progressing   Problem: Clinical Measurements: Goal: Ability to maintain clinical measurements within normal limits will improve Outcome: Progressing   Problem: Clinical Measurements: Goal: Ability to maintain clinical measurements within normal limits will improve Outcome: Progressing Goal: Will remain free from infection Outcome: Progressing Goal: Diagnostic test results will improve Outcome: Progressing Goal: Cardiovascular complication will be avoided Outcome: Progressing   Problem: Activity: Goal: Risk for activity intolerance will decrease Outcome: Progressing   Problem: Nutrition: Goal: Adequate nutrition will be maintained Outcome: Progressing   Problem: Coping: Goal: Level of anxiety will decrease Outcome: Progressing

## 2023-12-01 ENCOUNTER — Inpatient Hospital Stay (HOSPITAL_COMMUNITY)

## 2023-12-01 DIAGNOSIS — Z4682 Encounter for fitting and adjustment of non-vascular catheter: Secondary | ICD-10-CM | POA: Diagnosis not present

## 2023-12-01 DIAGNOSIS — F10939 Alcohol use, unspecified with withdrawal, unspecified: Secondary | ICD-10-CM | POA: Diagnosis not present

## 2023-12-01 DIAGNOSIS — R0602 Shortness of breath: Secondary | ICD-10-CM | POA: Diagnosis not present

## 2023-12-01 DIAGNOSIS — E876 Hypokalemia: Secondary | ICD-10-CM | POA: Diagnosis not present

## 2023-12-01 DIAGNOSIS — S2232XA Fracture of one rib, left side, initial encounter for closed fracture: Secondary | ICD-10-CM | POA: Diagnosis not present

## 2023-12-01 LAB — BASIC METABOLIC PANEL WITH GFR
Anion gap: 9 (ref 5–15)
BUN: 5 mg/dL — ABNORMAL LOW (ref 6–20)
CO2: 25 mmol/L (ref 22–32)
Calcium: 9 mg/dL (ref 8.9–10.3)
Chloride: 99 mmol/L (ref 98–111)
Creatinine, Ser: 0.59 mg/dL — ABNORMAL LOW (ref 0.61–1.24)
GFR, Estimated: >60 mL/min (ref >60)
Glucose, Bld: 150 mg/dL — ABNORMAL HIGH (ref 70–99)
Potassium: 3.5 mmol/L (ref 3.5–5.1)
Sodium: 133 mmol/L — ABNORMAL LOW (ref 135–145)

## 2023-12-01 LAB — CULTURE, BLOOD (ROUTINE X 2)
Culture: NO GROWTH
Culture: NO GROWTH
Special Requests: ADEQUATE
Special Requests: ADEQUATE

## 2023-12-01 LAB — GLUCOSE, CAPILLARY
Glucose-Capillary: 109 mg/dL — ABNORMAL HIGH (ref 70–99)
Glucose-Capillary: 118 mg/dL — ABNORMAL HIGH (ref 70–99)
Glucose-Capillary: 158 mg/dL — ABNORMAL HIGH (ref 70–99)
Glucose-Capillary: 186 mg/dL — ABNORMAL HIGH (ref 70–99)
Glucose-Capillary: 97 mg/dL (ref 70–99)

## 2023-12-01 LAB — MAGNESIUM: Magnesium: 1.5 mg/dL — ABNORMAL LOW (ref 1.7–2.4)

## 2023-12-01 MED ORDER — MAGNESIUM SULFATE 4 GM/100ML IV SOLN
4.0000 g | Freq: Once | INTRAVENOUS | Status: AC
  Administered 2023-12-01: 4 g via INTRAVENOUS
  Filled 2023-12-01: qty 100

## 2023-12-01 MED ORDER — IPRATROPIUM-ALBUTEROL 0.5-2.5 (3) MG/3ML IN SOLN: 3.0000 mL | RESPIRATORY_TRACT | Status: DC | PRN

## 2023-12-01 MED ORDER — POTASSIUM CHLORIDE 20 MEQ PO PACK
40.0000 meq | PACK | Freq: Once | ORAL | Status: AC
  Administered 2023-12-01: 40 meq
  Filled 2023-12-01: qty 2

## 2023-12-01 MED ORDER — MELATONIN 5 MG PO TABS
10.0000 mg | ORAL_TABLET | Freq: Every day | ORAL | Status: DC
  Administered 2023-12-01 – 2023-12-03 (×3): 10 mg via ORAL
  Filled 2023-12-01 (×3): qty 2

## 2023-12-01 MED ORDER — QUETIAPINE FUMARATE 50 MG PO TABS
50.0000 mg | ORAL_TABLET | Freq: Every day | ORAL | Status: DC
  Administered 2023-12-01 – 2023-12-06 (×6): 50 mg
  Filled 2023-12-01 (×6): qty 1

## 2023-12-01 NOTE — Progress Notes (Addendum)
 PROGRESS NOTE        PATIENT DETAILS Name: [facility] Age: 53 y.o. Sex: male Date of Birth: 10/08/1970 Admit Date: 11/25/2023 Admitting Physician Donalda CHRISTELLA Applebaum, MD ERE:[facility], No Pcp Per  Brief Summary: Patient is a 52 y.o.  male with history of EtOH use-numerous falls recently (per spouse-ongoing for the past several months)-presented with left facial droop-upon further evaluation with neuroimaging including MRI brain-patient was found to have findings consistent with traumatic brain injury including multifocal bilateral SAH, bilateral SDH, cerebral contusions with edema/enhancements in the left temporal lobe.  Significant events: 10/6>> presented with facial droop-to [facility] 10/7>> transferred to [facility]. 10/8>> mechanical fall after spouse left the room-repeat CT head/maxillofacial-stable. 10/9>>Cortrak tube inserted  Significant studies: 10/6>> x-ray left hand: No fracture 10/6>> x-ray pelvis/right hip: No fracture. 10/6>> CTA head/neck: No LVO/high-grade stenosis. 10/6>> CT C-spine: No acute abnormality of the C-spine 10/6>> CT maxillofacial: No fracture 10/6>> CT chest: Left 10th rib fracture. 10/7>> MRI brain:  multifocal bilateral SAH, bilateral SDH, cerebral contusions with edema/enhancements in the left temporal lobe. 10/8>> EEG: Negative for seizures. 10/8>> CT head: Stable prior SDH versus hygroma-stable SAH. 10/8>> CT maxillofacial: No facial bone fracture.  Significant microbiology data: 10/7>> blood culture: No growth  Procedures: None  Consults: Neurosurgery Neurology  Subjective: Per nursing staff-no major issues overnight-he slept through most of the night-but around 3 AM-attempted to get out of bed-and was given Ativan .  He is groggy this morning but able to follow commands (moves all 4 extremities when asked) briefly opens his eyes and looks at me.  Speech is slightly dysarthric-continues to have subtle left facial  droop.  RN was concerned that he may have aspirated-as he started to accumulate more secretions this morning but he remains stable on room air-chest x-ray without any infiltrates.  No longer on restraints since midnight-stable on just mittens.  Objective: Vitals: Blood pressure (!) 143/78, pulse 98, temperature 99.5 F (37.5 C), temperature source Axillary, resp. rate (!) 26, height 5' 9 (1.753 m), weight 68.4 kg, SpO2 97%.   Exam: Gen Exam: Confused-slightly somnolent but not in any distress.  Appears comfortable. HEENT:atraumatic, normocephalic Chest: Some transmitted upper airway sounds. CVS:S1S2 regular Abdomen:soft non tender, non distended Extremities:no edema Neurology: Difficult exam but seems to be moving all 4 extremities. Skin: no rash  Pertinent Labs/Radiology:    Latest Ref Rng & Units 11/30/2023    5:33 AM 11/28/2023    2:20 AM 11/27/2023    2:23 AM  CBC  WBC 4.0 - 10.5 K/uL 5.5  7.3  6.6   Hemoglobin 13.0 - 17.0 g/dL 88.2  88.1  88.5   Hematocrit 39.0 - 52.0 % 33.5  33.8  34.4   Platelets 150 - 400 K/uL 195  189  181     Lab Results  Component Value Date   NA 133 (L) 12/01/2023   K 3.5 12/01/2023   CL 99 12/01/2023   CO2 25 12/01/2023      Assessment/Plan: TBI with left temporal lobe contusion/edema/bilateral SAH/bilateral SDH Secondary to multiple falls that the patient has sustained in the past several weeks/months-suspect in the context of EtOH intoxication. Appreciate neurosurgical input-no surgical indications Appreciated neurology input-initially there was some suspicion for HSV encephalitis but felt unlikely after MRI brain findings.  No need for LP per neurology. Discussed with trauma service-[facility]-PA-C on  10/8-care is mostly supportive-PT/OT/SLP eval. Unfortunately-had another mechanical fall 10/8-has been counseled to let nursing staff know before he gets out of bed.  Discussed with nursing staff-bedside sitter ordered-fall  precautions.  Oropharyngeal dysphagia Likely secondary to above-continues to have a subtle left facial droop. Remains n.p.o.-NG tube in place-SLP following. Mental status still not optimal but slowly improving-he continues to intermittently accumulate secretions requiring frequent suctioning.  Thankfully stable on room air-chest x-ray without infiltrates.  He is coughing-able to clear his throat-seems to be protecting his airway well.  Will watch him closely.  Left 10th rib fracture Supportive care Incentive spirometry/flutter valve  Hypokalemia/hypomagnesemia/hypophosphatemia Secondary to EtOH use Continue to replete/recheck.  Rhabdomyolysis Mild CK levels have now normalized with supportive care.  Acute urinary retention Foley catheter inserted on admit (per flowsheet-around 800 cc residual urine drained) Continue Foley catheter Continue Flomax Voiding trial when is a bit more ambulatory-and a bit more awake/alert.  EtOH withdrawal ?  Hospital delirium versus acute metabolic encephalopathy Should be almost out of EtOH withdrawal at this point-completed phenobarbital taper.  Suspicion that given his longstanding history of EtOH use (per spouse-started drinking around 10 years-father/grandfather alcoholics as well)-concerned he probably already had some amount of alcohol related cognitive issues (Korsakoff/Wernicke's) at baseline.  Spouse acknowledges that he did have cognitive issues and used to confabulate prior to this hospitalization. Will complete high-dose thiamine  x 5 days on 10/12-subsequently will be started on usual dosing of thiamine  starting 10/11. Completed phenobarbital taper 10/11 Remains on Ativan  per CIWA protocol Started on Seroquel 10/11-Will increase to 50 mg nightly.  Continue melatonin. Continue supportive care-if his mental status does not improve in the next day or 2-May need to repeat EEG.   Nutrition Status: Nutrition Problem: Moderate  Malnutrition Etiology: social / environmental circumstances Signs/Symptoms: percent weight loss, moderate muscle depletion, mild fat depletion Percent weight loss: 13.3 % Interventions: Ensure Enlive (each supplement provides 350kcal and 20 grams of protein), MVI, Refer to RD note for recommendations, Calorie Count    Code status:   Code Status: Full Code   DVT Prophylaxis: SCDs Start: 11/26/23 0338   Family Communication: [name] [phone] updated 10/13   Disposition Plan: Status is: Inpatient Remains inpatient appropriate because: Severity of illness   Planned Discharge Destination:Home health versus rehab facility   Diet: Diet Order             Diet NPO time specified Except for: Sips with Meds  Diet effective now                     Antimicrobial agents: Anti-infectives (From admission, onward)    Start     Dose/Rate Route Frequency Ordered Stop   11/26/23 0000  acyclovir (ZOVIRAX) 700 mg in dextrose  5 % 100 mL IVPB  Status:  Discontinued        700 mg 114 mL/hr over 60 Minutes Intravenous Every 8 hours 11/25/23 2334 11/27/23 0346        MEDICATIONS: Scheduled Meds:  Chlorhexidine  Gluconate Cloth  6 each Topical Daily   feeding supplement (PROSource TF20)  60 mL Per Tube Daily   folic acid   1 mg Oral Daily   free water  150 mL Per Tube Q4H   lidocaine   1 patch Transdermal Q24H   melatonin  10 mg Oral QHS   multivitamin with minerals  1 tablet Oral Daily   potassium chloride   40 mEq Per Tube Once   QUEtiapine  25 mg Per Tube QHS  scopolamine  1 patch Transdermal Q72H   tamsulosin  0.4 mg Oral Daily   [START ON 12/02/2023] thiamine  (VITAMIN B1) injection  100 mg Intravenous Daily   Continuous Infusions:  feeding supplement (OSMOLITE 1.5 CAL) 55 mL/hr at 11/30/23 1915   magnesium  sulfate bolus IVPB 4 g (12/01/23 0826)   thiamine  (VITAMIN B1) injection 500 mg (11/30/23 1000)   PRN Meds:.acetaminophen  **OR** acetaminophen ,  dextrose , HYDROmorphone  (DILAUDID ) injection, ipratropium-albuterol , LORazepam  **OR** LORazepam , ondansetron  **OR** ondansetron  (ZOFRAN ) IV   I have personally reviewed following labs and imaging studies  LABORATORY DATA: CBC: Recent Labs  Lab 11/25/23 2044 11/25/23 2110 11/26/23 0612 11/27/23 0223 11/28/23 0220 11/30/23 0533  WBC 7.7  --  6.1 6.6 7.3 5.5  NEUTROABS 6.7  --   --   --   --   --   HGB 12.2* 11.9* 10.1* 11.4* 11.8* 11.7*  HCT 36.1* 35.0* 31.1* 34.4* 33.8* 33.5*  MCV 106.8*  --  108.0* 105.5* 102.4* 100.9*  PLT 277  --  205 181 189 195    Basic Metabolic Panel: Recent Labs  Lab 11/26/23 0102 11/26/23 0612 11/27/23 0736 11/28/23 0220 11/29/23 0246 11/30/23 0533 12/01/23 0516  NA  --    < > 132* 133* 134* 133* 133*  K  --    < > 2.9* 3.3* 3.4* 3.2* 3.5  CL  --    < > 97* 97* 96* 95* 99  CO2  --    < > 24 21* 22 26 25   GLUCOSE  --    < > 82 95 80 120* 150*  BUN  --    < > <5* <5* <5* <5* 5*  CREATININE  --    < > 0.64 0.68 0.81 0.57* 0.59*  CALCIUM   --    < > 7.7* 8.4* 8.8* 9.0 9.0  MG 1.6*   < > 1.4* 1.6* 1.3* 1.4* 1.5*  PHOS 2.3*  --  1.9* 3.2  --  3.0  --    < > = values in this interval not displayed.    GFR: Estimated Creatinine Clearance: 100.9 mL/min (A) (by C-G formula based on SCr of 0.59 mg/dL (L)).  Liver Function Tests: Recent Labs  Lab 11/25/23 2044 11/26/23 0612 11/27/23 0223  AST 47* 33 20  ALT 23 16 14   ALKPHOS 63 47 40  BILITOT 1.0 0.7 1.1  PROT 7.1 5.5* 5.4*  ALBUMIN 4.1 3.1* 2.3*   No results for input(s): LIPASE, AMYLASE in the last 168 hours. Recent Labs  Lab 11/25/23 2105 11/26/23 1806 11/30/23 0533  AMMONIA <13 36* 38*    Coagulation Profile: Recent Labs  Lab 11/25/23 2044  INR 0.9    Cardiac Enzymes: Recent Labs  Lab 11/25/23 2044 11/27/23 0223  CKTOTAL 902* 395    BNP (last 3 results) No results for input(s): PROBNP in the last 8760 hours.  Lipid Profile: No results for input(s): CHOL,  HDL, LDLCALC, TRIG, CHOLHDL, LDLDIRECT in the last 72 hours.  Thyroid  Function Tests: No results for input(s): TSH, T4TOTAL, FREET4, T3FREE, THYROIDAB in the last 72 hours.  Anemia Panel: No results for input(s): VITAMINB12, FOLATE, FERRITIN, TIBC, IRON, RETICCTPCT in the last 72 hours.  Urine analysis:    Component Value Date/Time   COLORURINE STRAW (A) 05/22/2020 0044   APPEARANCEUR CLEAR 05/22/2020 0044   LABSPEC 1.009 05/22/2020 0044   PHURINE 6.0 05/22/2020 0044   GLUCOSEU NEGATIVE 05/22/2020 0044   HGBUR SMALL (A) 05/22/2020 0044   BILIRUBINUR NEGATIVE 05/22/2020 0044  KETONESUR NEGATIVE 05/22/2020 0044   PROTEINUR NEGATIVE 05/22/2020 0044   NITRITE NEGATIVE 05/22/2020 0044   LEUKOCYTESUR NEGATIVE 05/22/2020 0044    Sepsis Labs: Lactic Acid, Venous    Component Value Date/Time   LATICACIDVEN 2.2 (HH) 05/23/2020 1216    MICROBIOLOGY: Recent Results (from the past 240 hours)  Blood culture (routine x 2)     Status: None (Preliminary result)   Collection Time: 11/26/23 12:59 AM   Specimen: BLOOD  Result Value Ref Range Status   Specimen Description BLOOD BLOOD LEFT ARM  Final   Special Requests   Final    BOTTLES DRAWN AEROBIC AND ANAEROBIC Blood Culture adequate volume   Culture   Final    NO GROWTH 4 DAYS Performed at [facility], [address], [place], [place] [address]    Report Status PENDING  Incomplete  Blood culture (routine x 2)     Status: None (Preliminary result)   Collection Time: 11/26/23  1:02 AM   Specimen: BLOOD  Result Value Ref Range Status   Specimen Description BLOOD BLOOD LEFT HAND  Final   Special Requests AEROBIC BOTTLE ONLY Blood Culture adequate volume  Final   Culture   Final    NO GROWTH 4 DAYS Performed at [facility], [address] [place], [place] [address]    Report Status PENDING  Incomplete    RADIOLOGY STUDIES/RESULTS: DG Chest Port 1V same Day Result Date: 12/01/2023 EXAM: 1  VIEW(S) XRAY OF THE CHEST 12/01/2023 07:52:00 AM COMPARISON: 11/30/2023 CLINICAL HISTORY: SOB (shortness of breath) 141880. Reason for exam: pt order states SOB (shortness of breath) FINDINGS: LINES, TUBES AND DEVICES: Enteric tube in place with tip below hemidiaphragm, collimated off view. LUNGS AND PLEURA: No focal pulmonary opacity. No pulmonary edema. No pleural effusion. No pneumothorax. HEART AND MEDIASTINUM: No acute abnormality of the cardiac and mediastinal silhouettes. BONES AND SOFT TISSUES: Left 10 th posterior rib fracture, unchanged. Better seen on recent CT of the chest from 11/24/21 IMPRESSION: 1. No acute cardiopulmonary abnormality detected 2. Left rib fracture, better characterized on prior CT chest Electronically signed by: Waddell Calk MD 12/01/2023 08:13 AM EDT RP Workstation: [id number]   DG Chest Port 1 View Result Date: 11/30/2023 EXAM: 1 VIEW(S) XRAY OF THE CHEST 11/30/2023 07:00:00 AM COMPARISON: 11/10/2023 and 11/25/2023 CLINICAL HISTORY: SOB (shortness of breath) FINDINGS: LINES, TUBES AND DEVICES: Enteric tube in place below diaphragm with tip collimated off view. LUNGS AND PLEURA: No focal pulmonary opacity. No pulmonary edema. No pleural effusion. No pneumothorax. HEART AND MEDIASTINUM: No acute abnormality of the cardiac and mediastinal silhouettes. BONES AND SOFT TISSUES: Suboptimal visualization of recently noted left 10th rib fractures. IMPRESSION: 1. No acute cardiopulmonary process detected. 2. Enteric tube present with which courses below the level of the hemidiaphragms. Tip not visualized. Electronically signed by: Waddell Calk MD 11/30/2023 07:45 AM EDT RP Workstation: [id number]     LOS: 5 days   Donalda Applebaum, MD  [facility]    To contact the attending provider between 7A-7P or the covering provider during after hours 7P-7A, please log into the web site [email or web address] and access using universal [place] password for that web site. If you do not  have the password, please call the hospital operator.  12/01/2023, 8:46 AM

## 2023-12-01 NOTE — Progress Notes (Signed)
 RT note. Patient NTS at this time per MD request.  Small amount of clear thin secretions suctioned at this time. Patient sat 96% on Rm air, RT will continue to monitor.    12/01/23 0741  Therapy Vitals  Pulse Rate 98  Resp (!) 26  MEWS Score/Color  MEWS Score 3  MEWS Score Color Yellow  Respiratory Assessment  Assessment Type Post-treatment  Respiratory Pattern Irregular  Chest Assessment Chest expansion symmetrical  Cough Non-productive;Congested;Weak  Sputum Amount Small  Sputum Color Clear  Sputum Consistency Thick  Bilateral Breath Sounds Rhonchi;Diminished  Oxygen Therapy/Pulse Ox  SpO2 97 %  O2 Device Room Air

## 2023-12-01 NOTE — Plan of Care (Signed)
  Problem: Clinical Measurements: Goal: Will remain free from infection Outcome: Progressing Goal: Diagnostic test results will improve Outcome: Progressing Goal: Respiratory complications will improve Outcome: Progressing   Problem: Nutrition: Goal: Adequate nutrition will be maintained Outcome: Progressing   Problem: Safety: Goal: Ability to remain free from injury will improve Outcome: Progressing   Problem: Skin Integrity: Goal: Risk for impaired skin integrity will decrease Outcome: Progressing

## 2023-12-01 NOTE — Plan of Care (Signed)

## 2023-12-01 NOTE — Progress Notes (Signed)
 Pt. Experienced acute tachypnea with coarse lungs sounds at change of shift. TF stopped, placed high fowlers and suctioned as per verbal order: Dr. Raenelle. RN Manisha made aware at change of shift and assumed care.

## 2023-12-02 ENCOUNTER — Inpatient Hospital Stay (HOSPITAL_COMMUNITY)

## 2023-12-02 DIAGNOSIS — S2232XA Fracture of one rib, left side, initial encounter for closed fracture: Secondary | ICD-10-CM | POA: Diagnosis not present

## 2023-12-02 DIAGNOSIS — E876 Hypokalemia: Secondary | ICD-10-CM | POA: Diagnosis not present

## 2023-12-02 DIAGNOSIS — F10939 Alcohol use, unspecified with withdrawal, unspecified: Secondary | ICD-10-CM | POA: Diagnosis not present

## 2023-12-02 LAB — BASIC METABOLIC PANEL WITH GFR
Anion gap: 12 (ref 5–15)
BUN: 9 mg/dL (ref 6–20)
CO2: 23 mmol/L (ref 22–32)
Calcium: 9 mg/dL (ref 8.9–10.3)
Chloride: 99 mmol/L (ref 98–111)
Creatinine, Ser: 0.61 mg/dL (ref 0.61–1.24)
GFR, Estimated: >60 mL/min (ref >60)
Glucose, Bld: 118 mg/dL — ABNORMAL HIGH (ref 70–99)
Potassium: 4 mmol/L (ref 3.5–5.1)
Sodium: 134 mmol/L — ABNORMAL LOW (ref 135–145)

## 2023-12-02 LAB — MAGNESIUM: Magnesium: 1.7 mg/dL (ref 1.7–2.4)

## 2023-12-02 LAB — GLUCOSE, CAPILLARY
Glucose-Capillary: 104 mg/dL — ABNORMAL HIGH (ref 70–99)
Glucose-Capillary: 128 mg/dL — ABNORMAL HIGH (ref 70–99)
Glucose-Capillary: 134 mg/dL — ABNORMAL HIGH (ref 70–99)
Glucose-Capillary: 148 mg/dL — ABNORMAL HIGH (ref 70–99)
Glucose-Capillary: 156 mg/dL — ABNORMAL HIGH (ref 70–99)
Glucose-Capillary: 169 mg/dL — ABNORMAL HIGH (ref 70–99)

## 2023-12-02 LAB — CBC
HCT: 34.9 % — ABNORMAL LOW (ref 39.0–52.0)
Hemoglobin: 11.7 g/dL — ABNORMAL LOW (ref 13.0–17.0)
MCH: 34.5 pg — ABNORMAL HIGH (ref 26.0–34.0)
MCHC: 33.5 g/dL (ref 30.0–36.0)
MCV: 102.9 fL — ABNORMAL HIGH (ref 80.0–100.0)
Platelets: 230 K/uL (ref 150–400)
RBC: 3.39 MIL/uL — ABNORMAL LOW (ref 4.22–5.81)
RDW: 13.2 % (ref 11.5–15.5)
WBC: 5.4 K/uL (ref 4.0–10.5)
nRBC: 0 % (ref 0.0–0.2)

## 2023-12-02 LAB — PROCALCITONIN: Procalcitonin: 0.67 ng/mL

## 2023-12-02 MED ORDER — MAGNESIUM SULFATE 2 GM/50ML IV SOLN
2.0000 g | Freq: Once | INTRAVENOUS | Status: AC
  Administered 2023-12-02: 2 g via INTRAVENOUS
  Filled 2023-12-02: qty 50

## 2023-12-02 NOTE — Plan of Care (Signed)

## 2023-12-02 NOTE — Progress Notes (Addendum)
 Speech Language Pathology Treatment: Dysphagia  Patient Details Name: [facility] MRN: [id number] DOB: [birth year] Today's Date: 12/02/2023 Time: 8892-8876 SLP Time Calculation (min) (ACUTE ONLY): 16 min  Assessment / Plan / Recommendation Clinical Impression  Pt is alert and is not agitated or significantly delirious compared to 10/10. Yesterday. Per MD note he required increased decreased management of secretions with increased suctioning with [place]. He is tangential in conversation and needs frequent redirecting. Has lingual coating suggestive of possible candidias. Noted to cough prior to po's with oral suctioning. Following oral care able to sustain attention to po trials of ice chips and cup sips, Anterior spill noted and swallows initiated without immediate s/s aspiration but coughing at end of study. Given bil SAH and SDH and cognitive deficits he requires instrumental study with MBS which is scheduled today.    HPI HPI: 53yo male admitted 11/25/23 after a fall, left facial droop, bilateral SAH and SDH. PMH: severe alcohol use disorder, HLD, depression, GERD, HTN, hypothyroidism, insomnia, hip replacements      SLP Plan  MBS          Recommendations  Diet recommendations: NPO Medication Administration: Via alternative means                  Oral care QID   Frequent or constant Supervision/Assistance Dysphagia, unspecified (R13.10)     MBS     Dustin Olam Bull  12/02/2023, 12:29 PM

## 2023-12-02 NOTE — Progress Notes (Addendum)
 PROGRESS NOTE        PATIENT DETAILS Name: Christian King Age: 53 y.o. Sex: male Date of Birth: November 12, 1970 Admit Date: 11/25/2023 Admitting Physician Donalda CHRISTELLA Applebaum, MD ERE:[place], No Pcp Per  Brief Summary: Patient is a 53 y.o.  male with history of EtOH use-numerous falls recently (per spouse-ongoing for the past several months)-presented with left facial droop-upon further evaluation with neuroimaging including MRI brain-patient was found to have findings consistent with traumatic brain injury including multifocal bilateral SAH, bilateral SDH, cerebral contusions with edema/enhancements in the left temporal lobe.  Significant events: 10/6>> presented with facial droop-to [name] 10/7>> transferred to [facility]. 10/8>> mechanical fall after spouse left the room-repeat CT head/maxillofacial-stable. 10/9>>Cortrak tube inserted-somnolent-accumulating secretions-NPO per SLP 10/13>>improved-much more awake/alert  Significant studies: 10/6>> x-ray left hand: No fracture 10/6>> x-ray pelvis/right hip: No fracture. 10/6>> CTA head/neck: No LVO/high-grade stenosis. 10/6>> CT C-spine: No acute abnormality of the C-spine 10/6>> CT maxillofacial: No fracture 10/6>> CT chest: Left 10th rib fracture. 10/7>> MRI brain:  multifocal bilateral SAH, bilateral SDH, cerebral contusions with edema/enhancements in the left temporal lobe. 10/8>> EEG: Negative for seizures. 10/8>> CT head: Stable prior SDH versus hygroma-stable SAH. 10/8>> CT maxillofacial: No facial bone fracture.  Significant microbiology data: 10/7>> blood culture: No growth  Procedures: None  Consults: Neurosurgery Neurology  Subjective: Awake and alert this morning-just on mittens-no restraints-anwswering questins appropriately!!  Objective: Vitals: Blood pressure 113/81, pulse 80, temperature 98.2 F (36.8 C), temperature source Oral, resp. rate 16, height 5' 9 (1.753 m), weight 70.6  kg, SpO2 97%.   Exam: Gen Exam:Alert awake-not in any distress HEENT:atraumatic, normocephalic Chest: B/L clear to auscultation anteriorly CVS:S1S2 regular Abdomen:soft non tender, non distended Extremities:no edema Neurology: Non focal Skin: no rash  Pertinent Labs/Radiology:    Latest Ref Rng & Units 12/02/2023    3:26 AM 11/30/2023    5:33 AM 11/28/2023    2:20 AM  CBC  WBC 4.0 - 10.5 K/uL 5.4  5.5  7.3   Hemoglobin 13.0 - 17.0 g/dL 88.2  88.2  88.1   Hematocrit 39.0 - 52.0 % 34.9  33.5  33.8   Platelets 150 - 400 K/uL 230  195  189     Lab Results  Component Value Date   NA 134 (L) 12/02/2023   K 4.0 12/02/2023   CL 99 12/02/2023   CO2 23 12/02/2023      Assessment/Plan: TBI with left temporal lobe contusion/edema/bilateral SAH/bilateral SDH Secondary to multiple falls that the patient has sustained in the past several weeks/months-suspect in the context of EtOH intoxication. Appreciate neurosurgical input-no surgical indications Appreciated neurology input-initially there was some suspicion for HSV encephalitis but felt unlikely after MRI brain findings.  No need for LP per neurology. Discussed with trauma service-[name]-PA-C on 10/8-care is mostly supportive-PT/OT/SLP eval. Unfortunately-had another mechanical fall 10/8-has been counseled to let nursing staff know before he gets out of bed.  Discussed with nursing staff-bedside sitter ordered-fall precautions.  Oropharyngeal dysphagia Likely secondary to above-then lately due to confusion associated with ETOH withdrrawls Mentation much improved today-hoping that dysphagia will improve as well-await SLP eval In the interim continue with NG feeds Maintain aspiration precautions  Left 10th rib fracture Supportive care Incentive spirometry/flutter valve  Hypokalemia/hypomagnesemia/hypophosphatemia Secondary to EtOH use Continue to replete/recheck.  Rhabdomyolysis Mild CK levels have now normalized with  supportive care.  Acute urinary retention Foley catheter inserted on admit (per flowsheet-around 800 cc residual urine drained) Continue Foley catheter Continue Flomax Voiding trial soon-now that patient more awake  EtOH withdrawal ?  Hospital delirium versus acute metabolic encephalopathy Much better-MUCH more awake and alert this am. Completed phenobarbital and Ativan  taper. Should be almost out of EtOH withdrawal at this point. Suspicion that given his longstanding history of EtOH use (per spouse-started drinking around 10 years-father/grandfather alcoholics as well)-concerned he probably already had some amount of alcohol related cognitive issues (Korsakoff/Wernicke's) at baseline.  Spouse acknowledges that he did have cognitive issues and used to confabulate prior to this hospitalization. Completed high-dose thiamine  x 5 days on 10/12-and now on 100 mg thiamine  daily    Nutrition Status: Nutrition Problem: Moderate Malnutrition Etiology: social / environmental circumstances Signs/Symptoms: percent weight loss, moderate muscle depletion, mild fat depletion Percent weight loss: 13.3 % Interventions: Ensure Enlive (each supplement provides 350kcal and 20 grams of protein), MVI, Refer to RD note for recommendations, Calorie Count    Code status:   Code Status: Full Code   DVT Prophylaxis: SCDs Start: 11/26/23 0338   Family Communication: [name] [phone] updated 10/13   Disposition Plan: Status is: Inpatient Remains inpatient appropriate because: Severity of illness   Planned Discharge Destination:Home health versus rehab facility   Diet: Diet Order             Diet NPO time specified Except for: Sips with Meds  Diet effective now                     Antimicrobial agents: Anti-infectives (From admission, onward)    Start     Dose/Rate Route Frequency Ordered Stop   11/26/23 0000  acyclovir (ZOVIRAX) 700 mg in dextrose  5 % 100 mL IVPB   Status:  Discontinued        700 mg 114 mL/hr over 60 Minutes Intravenous Every 8 hours 11/25/23 2334 11/27/23 0346        MEDICATIONS: Scheduled Meds:  Chlorhexidine  Gluconate Cloth  6 each Topical Daily   feeding supplement (PROSource TF20)  60 mL Per Tube Daily   folic acid   1 mg Oral Daily   free water  150 mL Per Tube Q4H   lidocaine   1 patch Transdermal Q24H   melatonin  10 mg Oral QHS   multivitamin with minerals  1 tablet Oral Daily   QUEtiapine  50 mg Per Tube QHS   scopolamine  1 patch Transdermal Q72H   tamsulosin  0.4 mg Oral Daily   thiamine  (VITAMIN B1) injection  100 mg Intravenous Daily   Continuous Infusions:  feeding supplement (OSMOLITE 1.5 CAL) 55 mL/hr at 12/01/23 1920   PRN Meds:.acetaminophen  **OR** acetaminophen , dextrose , HYDROmorphone  (DILAUDID ) injection, ipratropium-albuterol , ondansetron  **OR** ondansetron  (ZOFRAN ) IV   I have personally reviewed following labs and imaging studies  LABORATORY DATA: CBC: Recent Labs  Lab 11/25/23 2044 11/25/23 2110 11/26/23 0612 11/27/23 0223 11/28/23 0220 11/30/23 0533 12/02/23 0326  WBC 7.7  --  6.1 6.6 7.3 5.5 5.4  NEUTROABS 6.7  --   --   --   --   --   --   HGB 12.2*   < > 10.1* 11.4* 11.8* 11.7* 11.7*  HCT 36.1*   < > 31.1* 34.4* 33.8* 33.5* 34.9*  MCV 106.8*  --  108.0* 105.5* 102.4* 100.9* 102.9*  PLT 277  --  205 181 189 195 230   < > =  values in this interval not displayed.    Basic Metabolic Panel: Recent Labs  Lab 11/26/23 0102 11/26/23 0612 11/27/23 9263 11/28/23 0220 11/29/23 0246 11/30/23 0533 12/01/23 0516 12/02/23 0326  NA  --    < > 132* 133* 134* 133* 133* 134*  K  --    < > 2.9* 3.3* 3.4* 3.2* 3.5 4.0  CL  --    < > 97* 97* 96* 95* 99 99  CO2  --    < > 24 21* 22 26 25 23   GLUCOSE  --    < > 82 95 80 120* 150* 118*  BUN  --    < > <5* <5* <5* <5* 5* 9  CREATININE  --    < > 0.64 0.68 0.81 0.57* 0.59* 0.61  CALCIUM   --    < > 7.7* 8.4* 8.8* 9.0 9.0 9.0  MG 1.6*   < >  1.4* 1.6* 1.3* 1.4* 1.5* 1.7  PHOS 2.3*  --  1.9* 3.2  --  3.0  --   --    < > = values in this interval not displayed.    GFR: Estimated Creatinine Clearance: 104.2 mL/min (by C-G formula based on SCr of 0.61 mg/dL).  Liver Function Tests: Recent Labs  Lab 11/25/23 2044 11/26/23 0612 11/27/23 0223  AST 47* 33 20  ALT 23 16 14   ALKPHOS 63 47 40  BILITOT 1.0 0.7 1.1  PROT 7.1 5.5* 5.4*  ALBUMIN 4.1 3.1* 2.3*   No results for input(s): LIPASE, AMYLASE in the last 168 hours. Recent Labs  Lab 11/25/23 2105 11/26/23 1806 11/30/23 0533  AMMONIA <13 36* 38*    Coagulation Profile: Recent Labs  Lab 11/25/23 2044  INR 0.9    Cardiac Enzymes: Recent Labs  Lab 11/25/23 2044 11/27/23 0223  CKTOTAL 902* 395    BNP (last 3 results) No results for input(s): PROBNP in the last 8760 hours.  Lipid Profile: No results for input(s): CHOL, HDL, LDLCALC, TRIG, CHOLHDL, LDLDIRECT in the last 72 hours.  Thyroid  Function Tests: No results for input(s): TSH, T4TOTAL, FREET4, T3FREE, THYROIDAB in the last 72 hours.  Anemia Panel: No results for input(s): VITAMINB12, FOLATE, FERRITIN, TIBC, IRON, RETICCTPCT in the last 72 hours.  Urine analysis:    Component Value Date/Time   COLORURINE STRAW (A) 05/22/2020 0044   APPEARANCEUR CLEAR 05/22/2020 0044   LABSPEC 1.009 05/22/2020 0044   PHURINE 6.0 05/22/2020 0044   GLUCOSEU NEGATIVE 05/22/2020 0044   HGBUR SMALL (A) 05/22/2020 0044   BILIRUBINUR NEGATIVE 05/22/2020 0044   KETONESUR NEGATIVE 05/22/2020 0044   PROTEINUR NEGATIVE 05/22/2020 0044   NITRITE NEGATIVE 05/22/2020 0044   LEUKOCYTESUR NEGATIVE 05/22/2020 0044    Sepsis Labs: Lactic Acid, Venous    Component Value Date/Time   LATICACIDVEN 2.2 (HH) 05/23/2020 1216    MICROBIOLOGY: Recent Results (from the past 240 hours)  Blood culture (routine x 2)     Status: None   Collection Time: 11/26/23 12:59 AM   Specimen: BLOOD   Result Value Ref Range Status   Specimen Description BLOOD BLOOD LEFT ARM  Final   Special Requests   Final    BOTTLES DRAWN AEROBIC AND ANAEROBIC Blood Culture adequate volume   Culture   Final    NO GROWTH 5 DAYS Performed at [facility], [address], [place], [place] [address]    Report Status 12/01/2023 FINAL  Final  Blood culture (routine x 2)     Status: None  Collection Time: 11/26/23  1:02 AM   Specimen: BLOOD  Result Value Ref Range Status   Specimen Description BLOOD BLOOD LEFT HAND  Final   Special Requests AEROBIC BOTTLE ONLY Blood Culture adequate volume  Final   Culture   Final    NO GROWTH 5 DAYS Performed at [facility], [address], [place], [place] [address]    Report Status 12/01/2023 FINAL  Final    RADIOLOGY STUDIES/RESULTS: DG Chest Port 1 View Result Date: 12/02/2023 CLINICAL DATA:  141880 SOB (shortness of breath) [id number] EXAM: PORTABLE CHEST 1 VIEW COMPARISON:  December 01, 2023 FINDINGS: The cardiomediastinal silhouette is unchanged in contour. The enteric tube courses through the chest to the abdomen beyond the field-of-view. No pleural effusion. No significant pneumothorax. No acute pleuroparenchymal abnormality. Revisualization of LEFT tenth rib fracture. IMPRESSION: No acute cardiopulmonary abnormality. Electronically Signed   By: Corean Salter M.D.   On: 12/02/2023 07:51   DG Chest Port 1V same Day Result Date: 12/01/2023 EXAM: 1 VIEW(S) XRAY OF THE CHEST 12/01/2023 07:52:00 AM COMPARISON: 11/30/2023 CLINICAL HISTORY: SOB (shortness of breath) 141880. Reason for exam: pt order states SOB (shortness of breath) FINDINGS: LINES, TUBES AND DEVICES: Enteric tube in place with tip below hemidiaphragm, collimated off view. LUNGS AND PLEURA: No focal pulmonary opacity. No pulmonary edema. No pleural effusion. No pneumothorax. HEART AND MEDIASTINUM: No acute abnormality of the cardiac and mediastinal silhouettes. BONES AND SOFT TISSUES: Left 10 th  posterior rib fracture, unchanged. Better seen on recent CT of the chest from 11/24/21 IMPRESSION: 1. No acute cardiopulmonary abnormality detected 2. Left rib fracture, better characterized on prior CT chest Electronically signed by: Waddell Calk MD 12/01/2023 08:13 AM EDT RP Workstation: [id number]     LOS: 6 days   Donalda Applebaum, MD  [facility]    To contact the attending provider between 7A-7P or the covering provider during after hours 7P-7A, please log into the web site [email or web address] and access using universal [place] password for that web site. If you do not have the password, please call the hospital operator.  12/02/2023, 1:43 PM

## 2023-12-02 NOTE — Progress Notes (Signed)
 Modified Barium Swallow Study  Patient Details  Name: STONEY KARCZEWSKI MRN: [id number] Date of Birth: [birth year]  Today's Date: 12/02/2023  Modified Barium Swallow completed.  Full report located under Chart Review in the Imaging Section.  History of Present Illness 53yo male admitted 11/25/23 after a fall, left facial droop, bilateral SAH and SDH. PMH: severe alcohol use disorder, HLD, depression, GERD, HTN, hypothyroidism, insomnia, hip replacements   Clinical Impression Pt presents with moderate oropharyngeal dysphagia. Reduced oral control/cohesion, sensation and inability to clear oral cavity with significant pooling of secretions in anterior sulci throughout the study. Episode of silent aspiration with nectar thick suspected  after the swallow and was observed when fluoro turned on with cued cough appearing to clear trachea. During initial swallows his epiglottis adequately inverted however there was consistent penetration during multiple subsequent swallows attempting to clear oral/pharyngeal residue of thin (PAS 3,5), nectar and honey thick (PAS 3.5) with suspicion of feeding tube impacting inversion. Postural strategy was ineffective to prevent penetration and cues to cough only temporarily cleared vestibule. There was no esophageal retention observed. Recommend continue NPO, allowing ice chips after oral care. Recommend po trials with puree consistency with ST only for facilitation of musculature/oral clearance and removing Cortrak prior to repeat MBS. Factors that may increase risk of adverse event in presence of aspiration Noe & Lianne 2021): Reduced cognitive function  Swallow Evaluation Recommendations Recommendations: NPO (except ice chips) Medication Administration: Via alternative means Oral care recommendations: Oral care QID (4x/day);Oral care before ice chips/water      Dustin Olam Bull 12/02/2023,3:14 PM

## 2023-12-03 DIAGNOSIS — E876 Hypokalemia: Secondary | ICD-10-CM | POA: Diagnosis not present

## 2023-12-03 DIAGNOSIS — S2232XA Fracture of one rib, left side, initial encounter for closed fracture: Secondary | ICD-10-CM | POA: Diagnosis not present

## 2023-12-03 DIAGNOSIS — F10939 Alcohol use, unspecified with withdrawal, unspecified: Secondary | ICD-10-CM | POA: Diagnosis not present

## 2023-12-03 LAB — GLUCOSE, CAPILLARY
Glucose-Capillary: 149 mg/dL — ABNORMAL HIGH (ref 70–99)
Glucose-Capillary: 171 mg/dL — ABNORMAL HIGH (ref 70–99)
Glucose-Capillary: 157 mg/dL — ABNORMAL HIGH (ref 70–99)
Glucose-Capillary: 180 mg/dL — ABNORMAL HIGH (ref 70–99)
Glucose-Capillary: 167 mg/dL — ABNORMAL HIGH (ref 70–99)

## 2023-12-03 MED ORDER — ALUM & MAG HYDROXIDE-SIMETH 200-200-20 MG/5ML PO SUSP
30.0000 mL | Freq: Four times a day (QID) | ORAL | Status: DC | PRN
  Administered 2023-12-03: 30 mL
  Filled 2023-12-03: qty 30

## 2023-12-03 MED ORDER — PHENOL 1.4 % MT LIQD
1.0000 | OROMUCOSAL | Status: DC | PRN
  Administered 2023-12-03: 1 via OROMUCOSAL
  Filled 2023-12-03: qty 177

## 2023-12-03 MED ORDER — PANTOPRAZOLE SODIUM 40 MG IV SOLR
40.0000 mg | Freq: Two times a day (BID) | INTRAVENOUS | Status: DC
  Administered 2023-12-03 – 2023-12-06 (×8): 40 mg via INTRAVENOUS
  Filled 2023-12-03 (×8): qty 10

## 2023-12-03 NOTE — TOC Progression Note (Signed)
 Transition of Care [facility]) - Progression Note    Patient Details  Name: Christian King MRN: [id number] Date of Birth: 01/20/1971  Transition of Care [facility]) CM/SW Contact  Luise JAYSON Pan, [place] Phone Number: 12/03/2023, 12:39 PM  Clinical Narrative:   CSW addressed substance use consult and provided patient with resources on his AVS.   Expected Discharge Plan:  (to be determined) Barriers to Discharge: Continued Medical Work up               Expected Discharge Plan and Services In-house Referral: Artist Discharge Planning Services: CM Consult   Living arrangements for the past 2 months: Single Family Home                 DME Arranged: N/A DME Agency: NA       HH Arranged: NA HH Agency: NA         Social Drivers of Health (SDOH) Interventions SDOH Screenings   Food Insecurity: No Food Insecurity (11/27/2023)  Housing: Low Risk  (11/27/2023)  Transportation Needs: No Transportation Needs (11/27/2023)  Utilities: Not At Risk (11/27/2023)  Alcohol Screen: High Risk (11/10/2023)  Depression (PHQ2-9): Low Risk  (11/27/2022)  Financial Resource Strain: Low Risk  (11/27/2022)  Physical Activity: Insufficiently Active (11/27/2022)  Social Connections: Unknown (07/21/2023)  Stress: No Stress Concern Present (11/27/2022)  Tobacco Use: High Risk (11/25/2023)    Readmission Risk Interventions     No data to display

## 2023-12-03 NOTE — Progress Notes (Signed)
 Physical Therapy Treatment Patient Details Name: Christian King MRN: [id number] DOB: January 26, 1971 Today's Date: 12/03/2023   History of Present Illness Patient is a 53 y.o. male presented with left facial droop-upon further evaluation with neuroimaging including MRI brain-patient was found to have findings consistent with traumatic brain injury including multifocal bilateral SAH, bilateral SDH, cerebral contusions with edema/enhancements in the left temporal lobe. Past history of EtOH use-numerous falls recently (per spouse-ongoing for the past several months)    PT Comments  Pt more alert compared to last few days and attempted session. Pt however presenting with both cognitive and functional deficits requiring modA and max directional multimodal cues to sequence tasks. Pt hyperfocused on not having phone and tablet. Pt re-oriented to situation, time, and place. Pt with noted decreased insight to safety and deficits, impaired co-ordination, generalized weakness, continuous drooling, shuffling gait pattern, impaired balance, and currently requires modA for all mobility. Pt to greatly benefit from aggressive inpatient rehab program > 3 hrs a day to address both cognitive and functional deficits for maximal functional recovery. Acute PT to cont to follow.    If plan is discharge home, recommend the following: A lot of help with walking and/or transfers;A lot of help with bathing/dressing/bathroom;Supervision due to cognitive status;Help with stairs or ramp for entrance;Assist for transportation;Direct supervision/assist for medications management;Direct supervision/assist for financial management;Assistance with cooking/housework   Can travel by private vehicle        Equipment Recommendations  Other (comment) (TBD at next venue)    Recommendations for Other Services Rehab consult     Precautions / Restrictions Precautions Precautions: Fall Recall of Precautions/Restrictions: Impaired      Mobility  Bed Mobility Overal bed mobility: Needs Assistance Bed Mobility: Supine to Sit     Supine to sit: Min assist     General bed mobility comments: max directional multimodal cues to sit EOB, increased time    Transfers Overall transfer level: Needs assistance Equipment used: 1 person hand held assist Transfers: Sit to/from Stand Sit to Stand: Min assist           General transfer comment: pt with wide base of support, shakiness, impaired coordination, increased time, max directional cues, minA to power up and steady    Ambulation/Gait Ambulation/Gait assistance: Mod assist, +2 safety/equipment Gait Distance (Feet): 10 Feet (to/from sink) Assistive device: 1 person hand held assist, IV Pole Gait Pattern/deviations: Step-through pattern, Decreased stride length, Shuffle, Ataxic, Festinating, Trunk flexed, Wide base of support Gait velocity: dec Gait velocity interpretation: <1.8 ft/sec, indicate of risk for recurrent falls   General Gait Details: pt shaky, unable to clear feet, shuffled gait pattern, HR up to 141bpm, trunk flexion, requiring UE support   Stairs             Wheelchair Mobility     Tilt Bed    Modified Rankin (Stroke Patients Only)       Balance Overall balance assessment: Needs assistance Sitting-balance support: Feet supported, No upper extremity supported Sitting balance-Leahy Scale: Fair     Standing balance support: Single extremity supported, During functional activity Standing balance-Leahy Scale: Poor Standing balance comment: reliant on external support to complete ADLs at sink                            Communication Communication Communication: Impaired Factors Affecting Communication: Reduced clarity of speech  Cognition Arousal: Alert Behavior During Therapy: Restless   PT - Cognitive impairments: Orientation, Awareness,  Sequencing, Problem solving, Safety/Judgement, Rancho level                    Rancho Levels of Cognitive Functioning Rancho Los Amigos Scales of Cognitive Functioning: Confused, Inappropriate Non-Agitated: Maximal Assistance [email or web address] Scales of Cognitive Functioning: Confused, Inappropriate Non-Agitated: Maximal Assistance [V] PT - Cognition Comments: pt more alert compared to last few days however is confused regarding situation,  time, and place. Pt with decreased insight to deficits and safety. Pt with difficulty sequencing and processing. Following commands: Impaired Following commands impaired: Follows one step commands with increased time, Follows one step commands inconsistently    Cueing Cueing Techniques: Verbal cues, Gestural cues, Tactile cues, Visual cues  Exercises      General Comments General comments (skin integrity, edema, etc.): HR increased to 141bpm when standing at sink, pt reported dizziness upon initial sitting, BP stable, dissipated and denied dizziness in standing      Pertinent Vitals/Pain Pain Assessment Pain Assessment: No/denies pain    Home Living                          Prior Function            PT Goals (current goals can now be found in the care plan section) Acute Rehab PT Goals PT Goal Formulation: With patient Time For Goal Achievement: 12/11/23 Potential to Achieve Goals: Good Progress towards PT goals: Progressing toward goals    Frequency    Min 4X/week      PT Plan      Co-evaluation              AM-PAC PT 6 Clicks Mobility   Outcome Measure  Help needed turning from your back to your side while in a flat bed without using bedrails?: A Little Help needed moving from lying on your back to sitting on the side of a flat bed without using bedrails?: A Little Help needed moving to and from a bed to a chair (including a wheelchair)?: A Lot Help needed standing up from a chair using your arms (e.g., wheelchair or bedside chair)?: A Lot Help needed to walk in hospital  room?: Total (distance) Help needed climbing 3-5 steps with a railing? : Total 6 Click Score: 12    End of Session Equipment Utilized During Treatment: Gait belt Activity Tolerance: Patient tolerated treatment well Patient left: in bed;with call bell/phone within reach;with nursing/sitter in room (sitting EOB with OT and sitter) Nurse Communication: Mobility status PT Visit Diagnosis: Difficulty in walking, not elsewhere classified (R26.2)     Time: 9245-9179 PT Time Calculation (min) (ACUTE ONLY): 26 min  Charges:    $Gait Training: 8-22 mins $Therapeutic Activity: 8-22 mins PT General Charges $$ ACUTE PT VISIT: 1 Visit                     Norene Ames, PT, DPT Acute Rehabilitation Services Secure chat preferred Office #: [phone]    Norene CHRISTELLA Ames 12/03/2023, 8:38 AM

## 2023-12-03 NOTE — Evaluation (Signed)
 Speech Language Pathology Evaluation Patient Details Name: Christian King MRN: [id number] DOB: 08/09/1970 Today's Date: 12/03/2023 Time: 8450-8397 SLP Time Calculation (min) (ACUTE ONLY): 13 min  Problem List:  Patient Active Problem List   Diagnosis Date Noted   Malnutrition of moderate degree 11/29/2023   Alcohol withdrawal (HCC) 11/26/2023   Falls, initial encounter 11/26/2023   Subarachnoid hemorrhage (HCC) 11/26/2023   Subdural hematoma (HCC) 11/26/2023   Hypomagnesemia 11/26/2023   Elevated CK 11/26/2023   Elevated troponin 11/26/2023   Substance induced mood disorder (HCC) 07/21/2023   Severe alcohol use disorder (HCC) 07/21/2023   Chronic hypokalemia 07/21/2023   Hyperlipidemia 07/21/2023   Hypokalemia 03/17/2016   Cerebral edema (HCC) 03/20/2015   Past Medical History:  Past Medical History:  Diagnosis Date   Alcohol abuse 07/24/2010   B12 deficiency    Blood dyscrasia    hemachromatosis   BMI 25.0-25.9,adult 01/28/2015   Bronchitis    Degenerative joint disease (DJD) of hip    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   Depression    Essential hypertension 04/26/2010   Qualifier: Diagnosis of   By: Gwenn Grimes         GERD (gastroesophageal reflux disease)    Gout    Gout 04/26/2010   Qualifier: Diagnosis of   By: Gwenn Grimes         Hemochromatosis 10/03/2013   History of DVT (deep vein thrombosis) 05/15/2016   History of kidney stones    HLD (hyperlipidemia)    HTN (hypertension)    Hyperlipidemia 04/26/2010   Qualifier: Diagnosis of   By: Gwenn Grimes         Hypokalemia 03/17/2016   Hypothyroidism    Insomnia 02/05/2014   QT prolongation 03/20/2015   Severe alcohol use disorder (HCC) 07/21/2023   Ventricular tachycardia (HCC)    in setting of hypokalemia/ hypomagnesemia and ETOH   Vitamin B12 deficiency 04/26/2010   Qualifier: Diagnosis of   By: Gwenn Grimes         Wears glasses    Past Surgical History:   Past Surgical History:  Procedure Laterality Date   TOTAL HIP ARTHROPLASTY Right    TOTAL HIP ARTHROPLASTY Left 03/16/2016   Procedure: TOTAL HIP ARTHROPLASTY;  Surgeon: Shari Sieving, MD;  Location: [facility] OR;  Service: Orthopedics;  Laterality: Left;   vascularized fibular graft     right hip for avascular necrosis   HPI:  53yo male admitted 11/25/23 after a fall, left facial droop, bilateral SAH and SDH. PMH: severe alcohol use disorder, HLD, depression, GERD, HTN, hypothyroidism, insomnia, hip replacements   Assessment / Plan / Recommendation Clinical Impression  Pt reports he was working PTA and independent. He presents with L labial and lingual weakness, contributing to dysarthria and reduced intelligibility at the conversation level. He presents with cognitive deficits related to awareness, attention, memory, problem solving, and executive functioning. He is generally tangential but can be redirected except when perseverative on his diagnosis of GERD and his meds. He performs functionally on structured tasks but this does not carry over into conversation or other self-care activities. His performance is characteristic of [place] Level V (confused, inappropriate) and requires full supervision. Recommend intensive SLP f/u to target deficits listed above, >3 hrs/day. Will continue following.    SLP Assessment  SLP Recommendation/Assessment: Patient needs continued Speech Language Pathology Services SLP Visit Diagnosis: Dysarthria and anarthria (R47.1);Cognitive communication deficit (R41.841)     Assistance Recommended at Discharge  Frequent or constant Supervision/Assistance  Functional  Status Assessment Patient has had a recent decline in their functional status and demonstrates the ability to make significant improvements in function in a reasonable and predictable amount of time.  Frequency and Duration min 2x/week  2 weeks      SLP Evaluation Cognition  Overall Cognitive Status:  Within Functional Limits for tasks assessed Arousal/Alertness: Awake/alert Orientation Level: Oriented to person;Oriented to place;Oriented to time;Disoriented to situation Attention: Sustained Sustained Attention: Impaired Sustained Attention Impairment: Verbal basic;Functional basic Memory: Impaired Memory Impairment: Retrieval deficit;Decreased recall of new information Awareness: Impaired Awareness Impairment: Emergent impairment Problem Solving: Impaired Problem Solving Impairment: Functional basic Executive Function: Reasoning Reasoning: Impaired Reasoning Impairment: Functional basic [place] Scales of Cognitive Functioning: Confused, Inappropriate Non-Agitated: Maximal Assistance       Comprehension  Auditory Comprehension Overall Auditory Comprehension: Appears within functional limits for tasks assessed    Expression Expression Primary Mode of Expression: Verbal Verbal Expression Overall Verbal Expression: Appears within functional limits for tasks assessed   Oral / Motor  Oral Motor/Sensory Function Overall Oral Motor/Sensory Function: Moderate impairment Facial ROM: Reduced left Facial Symmetry: Abnormal symmetry left Facial Strength: Reduced left Facial Sensation: Within Functional Limits Lingual ROM: Reduced left Lingual Symmetry: Abnormal symmetry left Lingual Strength: Reduced Lingual Sensation: Within Functional Limits Motor Speech Overall Motor Speech: Impaired Respiration: Within functional limits Phonation: Normal Resonance: Within functional limits Articulation: Impaired Level of Impairment: Conversation Intelligibility: Intelligibility reduced Conversation: 75-100% accurate            Damien Blumenthal, M.A., CCC-SLP Speech Language Pathology, Acute Rehabilitation Services  Secure Chat preferred [phone]  12/03/2023, 4:45 PM

## 2023-12-03 NOTE — Progress Notes (Addendum)
 PROGRESS NOTE        PATIENT DETAILS Name: Christian King Age: 53 y.o. Sex: male Date of Birth: 1971-01-18 Admit Date: 11/25/2023 Admitting Physician Christian CHRISTELLA Applebaum, MD ERE:[place], No Pcp Per  Brief Summary: Patient is a 53 y.o.  male with history of EtOH use-numerous falls recently (per spouse-ongoing for the past several months)-presented with left facial droop-upon further evaluation with neuroimaging including MRI brain-patient was found to have findings consistent with traumatic brain injury including multifocal bilateral SAH, bilateral SDH, cerebral contusions with edema/enhancements in the left temporal lobe.  Significant events: 10/6>> presented with facial droop-to [facility] 10/7>> transferred to [facility]. 10/8>> mechanical fall after spouse left the room-repeat CT head/maxillofacial-stable. 10/9>>Cortrak tube inserted-somnolent-accumulating secretions-NPO per SLP 10/13>>improved-much more awake/alert  Significant studies: 10/6>> x-ray left hand: No fracture 10/6>> x-ray pelvis/right hip: No fracture. 10/6>> CTA head/neck: No LVO/high-grade stenosis. 10/6>> CT C-spine: No acute abnormality of the C-spine 10/6>> CT maxillofacial: No fracture 10/6>> CT chest: Left 10th rib fracture. 10/7>> MRI brain:  multifocal bilateral SAH, bilateral SDH, cerebral contusions with edema/enhancements in the left temporal lobe. 10/8>> EEG: Negative for seizures. 10/8>> CT head: Stable prior SDH versus hygroma-stable SAH. 10/8>> CT maxillofacial: No facial bone fracture.  Significant microbiology data: 10/7>> blood culture: No growth  Procedures: None  Consults: Neurosurgery Neurology  Subjective: Awake/alert this morning-no major issues.  Objective: Vitals: Blood pressure 139/87, pulse 80, temperature 98.9 F (37.2 C), temperature source Oral, resp. rate 20, height 5' 9 (1.753 m), weight 70.6 kg, SpO2 96%.   Exam: Gen Exam:Alert awake-not in  any distress HEENT:atraumatic, normocephalic Chest: B/L clear to auscultation anteriorly CVS:S1S2 regular Abdomen:soft non tender, non distended Extremities:no edema Neurology: Non focal Skin: no rash  Pertinent Labs/Radiology:    Latest Ref Rng & Units 12/02/2023    3:26 AM 11/30/2023    5:33 AM 11/28/2023    2:20 AM  CBC  WBC 4.0 - 10.5 K/uL 5.4  5.5  7.3   Hemoglobin 13.0 - 17.0 g/dL 88.2  88.2  88.1   Hematocrit 39.0 - 52.0 % 34.9  33.5  33.8   Platelets 150 - 400 K/uL 230  195  189     Lab Results  Component Value Date   NA 134 (L) 12/02/2023   K 4.0 12/02/2023   CL 99 12/02/2023   CO2 23 12/02/2023      Assessment/Plan: TBI with left temporal lobe contusion/edema/bilateral SAH/bilateral SDH Secondary to multiple falls that the patient has sustained in the past several weeks/months-suspect in the context of EtOH intoxication. Appreciate neurosurgical input-no surgical indications Appreciated neurology input-initially there was some suspicion for HSV encephalitis but felt unlikely after MRI brain findings.  No need for LP per neurology. Discussed with trauma service-[facility]-PA-C on 10/8-care is mostly supportive-PT/OT/SLP eval. Unfortunately-had another mechanical fall 10/8-has been counseled to let nursing staff know before he gets out of bed.  Discussed with nursing staff-bedside sitter ordered-fall precautions.  Oropharyngeal dysphagia Likely secondary to above-then lately due to confusion associated with ETOH withdrrawls Although may patient much improved-continues to exhibit oropharyngeal dysphagia on MBS done on 10/13 SLP following-plans are to repeat MBS later this week. Maintain strict aspiration precautions.   Left 10th rib fracture Supportive care Incentive spirometry/flutter valve  Hypokalemia/hypomagnesemia/hypophosphatemia Secondary to EtOH use Continue to replete/recheck.  Rhabdomyolysis Mild CK levels have now normalized with  supportive  care.  Acute urinary retention Foley catheter inserted on admit (per flowsheet-around 800 cc residual urine drained) Continue Foley catheter Continue Flomax Voiding trial in the next day or 2-if patient continues to ambulate/mobilize with PT/OT.  GERD PPI  EtOH withdrawal ?  Hospital delirium versus acute metabolic encephalopathy Much better-MUCH more awake and alert this am. Completed phenobarbital and Ativan  taper. Should be almost out of EtOH withdrawal at this point. Suspicion that given his longstanding history of EtOH use (per spouse-started drinking around 10 years-father/grandfather alcoholics as well)-concerned he probably already had some amount of alcohol related cognitive issues (Korsakoff/Wernicke's) at baseline.  Spouse acknowledges that he did have cognitive issues and used to confabulate prior to this hospitalization. Completed high-dose thiamine  x 5 days on 10/12-and now on 100 mg thiamine  daily  Nutrition Status: Nutrition Problem: Moderate Malnutrition Etiology: social / environmental circumstances Signs/Symptoms: percent weight loss, moderate muscle depletion, mild fat depletion Percent weight loss: 13.3 % Interventions: Ensure Enlive (each supplement provides 350kcal and 20 grams of protein), MVI, Refer to RD note for recommendations, Calorie Count    Code status:   Code Status: Full Code   DVT Prophylaxis: SCDs Start: 11/26/23 0338   Family Communication: [facility] [phone] updated 10/14   Disposition Plan: Status is: Inpatient Remains inpatient appropriate because: Severity of illness   Planned Discharge Destination:Home health versus rehab facility   Diet: Diet Order             Diet NPO time specified Except for: Sips with Meds  Diet effective now                     Antimicrobial agents: Anti-infectives (From admission, onward)    Start     Dose/Rate Route Frequency Ordered Stop   11/26/23 0000  acyclovir  (ZOVIRAX) 700 mg in dextrose  5 % 100 mL IVPB  Status:  Discontinued        700 mg 114 mL/hr over 60 Minutes Intravenous Every 8 hours 11/25/23 2334 11/27/23 0346        MEDICATIONS: Scheduled Meds:  Chlorhexidine  Gluconate Cloth  6 each Topical Daily   feeding supplement (PROSource TF20)  60 mL Per Tube Daily   folic acid   1 mg Oral Daily   free water  150 mL Per Tube Q4H   lidocaine   1 patch Transdermal Q24H   melatonin  10 mg Oral QHS   multivitamin with minerals  1 tablet Oral Daily   pantoprazole  (PROTONIX ) IV  40 mg Intravenous Q12H   QUEtiapine  50 mg Per Tube QHS   scopolamine  1 patch Transdermal Q72H   tamsulosin  0.4 mg Oral Daily   thiamine  (VITAMIN B1) injection  100 mg Intravenous Daily   Continuous Infusions:  feeding supplement (OSMOLITE 1.5 CAL) 55 mL/hr at 12/01/23 1920   PRN Meds:.acetaminophen  **OR** acetaminophen , alum & mag hydroxide-simeth, dextrose , HYDROmorphone  (DILAUDID ) injection, ipratropium-albuterol , ondansetron  **OR** ondansetron  (ZOFRAN ) IV, phenol   I have personally reviewed following labs and imaging studies  LABORATORY DATA: CBC: Recent Labs  Lab 11/27/23 0223 11/28/23 0220 11/30/23 0533 12/02/23 0326  WBC 6.6 7.3 5.5 5.4  HGB 11.4* 11.8* 11.7* 11.7*  HCT 34.4* 33.8* 33.5* 34.9*  MCV 105.5* 102.4* 100.9* 102.9*  PLT 181 189 195 230    Basic Metabolic Panel: Recent Labs  Lab 11/27/23 0736 11/28/23 0220 11/29/23 0246 11/30/23 0533 12/01/23 0516 12/02/23 0326  NA 132* 133* 134* 133* 133* 134*  K 2.9* 3.3* 3.4*  3.2* 3.5 4.0  CL 97* 97* 96* 95* 99 99  CO2 24 21* 22 26 25 23   GLUCOSE 82 95 80 120* 150* 118*  BUN <5* <5* <5* <5* 5* 9  CREATININE 0.64 0.68 0.81 0.57* 0.59* 0.61  CALCIUM  7.7* 8.4* 8.8* 9.0 9.0 9.0  MG 1.4* 1.6* 1.3* 1.4* 1.5* 1.7  PHOS 1.9* 3.2  --  3.0  --   --     GFR: Estimated Creatinine Clearance: 104.2 mL/min (by C-G formula based on SCr of 0.61 mg/dL).  Liver Function Tests: Recent Labs  Lab  11/27/23 0223  AST 20  ALT 14  ALKPHOS 40  BILITOT 1.1  PROT 5.4*  ALBUMIN 2.3*   No results for input(s): LIPASE, AMYLASE in the last 168 hours. Recent Labs  Lab 11/26/23 1806 11/30/23 0533  AMMONIA 36* 38*    Coagulation Profile: No results for input(s): INR, PROTIME in the last 168 hours.   Cardiac Enzymes: Recent Labs  Lab 11/27/23 0223  CKTOTAL 395    BNP (last 3 results) No results for input(s): PROBNP in the last 8760 hours.  Lipid Profile: No results for input(s): CHOL, HDL, LDLCALC, TRIG, CHOLHDL, LDLDIRECT in the last 72 hours.  Thyroid  Function Tests: No results for input(s): TSH, T4TOTAL, FREET4, T3FREE, THYROIDAB in the last 72 hours.  Anemia Panel: No results for input(s): VITAMINB12, FOLATE, FERRITIN, TIBC, IRON, RETICCTPCT in the last 72 hours.  Urine analysis:    Component Value Date/Time   COLORURINE STRAW (A) 05/22/2020 0044   APPEARANCEUR CLEAR 05/22/2020 0044   LABSPEC 1.009 05/22/2020 0044   PHURINE 6.0 05/22/2020 0044   GLUCOSEU NEGATIVE 05/22/2020 0044   HGBUR SMALL (A) 05/22/2020 0044   BILIRUBINUR NEGATIVE 05/22/2020 0044   KETONESUR NEGATIVE 05/22/2020 0044   PROTEINUR NEGATIVE 05/22/2020 0044   NITRITE NEGATIVE 05/22/2020 0044   LEUKOCYTESUR NEGATIVE 05/22/2020 0044    Sepsis Labs: Lactic Acid, Venous    Component Value Date/Time   LATICACIDVEN 2.2 (HH) 05/23/2020 1216    MICROBIOLOGY: Recent Results (from the past 240 hours)  Blood culture (routine x 2)     Status: None   Collection Time: 11/26/23 12:59 AM   Specimen: BLOOD  Result Value Ref Range Status   Specimen Description BLOOD BLOOD LEFT ARM  Final   Special Requests   Final    BOTTLES DRAWN AEROBIC AND ANAEROBIC Blood Culture adequate volume   Culture   Final    NO GROWTH 5 DAYS Performed at [facility], [address], [place], [place] [address]    Report Status 12/01/2023 FINAL  Final  Blood culture (routine x  2)     Status: None   Collection Time: 11/26/23  1:02 AM   Specimen: BLOOD  Result Value Ref Range Status   Specimen Description BLOOD BLOOD LEFT HAND  Final   Special Requests AEROBIC BOTTLE ONLY Blood Culture adequate volume  Final   Culture   Final    NO GROWTH 5 DAYS Performed at [facility], [address] [place], [place] [address]    Report Status 12/01/2023 FINAL  Final    RADIOLOGY STUDIES/RESULTS: DG Swallowing Func-Speech Pathology Result Date: 12/02/2023 Table formatting from the original result was not included. Modified Barium Swallow Study Patient Details Name: Christian [facility] MRN: [id number] Date of Birth: Aug 30, 1970 Today's Date: 12/02/2023 HPI/PMH: HPI: 53yo male admitted 11/25/23 after a fall, left facial droop, bilateral SAH and SDH. PMH: severe alcohol use disorder, HLD, depression, GERD, HTN, hypothyroidism, insomnia, hip  replacements Clinical Impression: Clinical Impression: Pt presents with moderate oropharyngeal dysphagia. Reduced oral control/cohesion, sensation and inability to clear oral cavity with significant pooling of secretions in anterior sulci throughout the study. Episode of silent aspiration with nectar thick suspected  after the swallow and was observed when fluoro turned on with cued cough appearing to clear trachea. During initial swallows his epiglottis adequately inverted however there was consistent penetration during multiple subsequent swallows attempting to clear oral/pharyngeal residue of thin (PAS 3,5), nectar and honey thick (PAS 3.5) with suspicion of feeding tube impacting inversion. Postural strategy was ineffective to prevent penetration and cues to cough only temporarily cleared vestibule. There was no esophageal retention observed. Recommend continue NPO, allowing ice chips after oral care. Recommend po trials with puree consistency with ST only for facilitation of musculature/oral clearance and removing Cortrak prior to repeat MBS. Factors that  may increase risk of adverse event in presence of aspiration Noe & Lianne 2021): Factors that may increase risk of adverse event in presence of aspiration Noe & Lianne 2021): Reduced cognitive function Recommendations/Plan: Swallowing Evaluation Recommendations Swallowing Evaluation Recommendations Recommendations: NPO (except ice chips) Medication Administration: Via alternative means Oral care recommendations: Oral care QID (4x/day); Oral care before ice chips/water Treatment Plan Treatment Plan Treatment recommendations: Therapy as outlined in treatment plan below Follow-up recommendations: -- (TBD) Functional status assessment: Patient has had a recent decline in their functional status and demonstrates the ability to make significant improvements in function in a reasonable and predictable amount of time. Treatment frequency: Min 2x/week Interventions: Trials of upgraded texture/liquids; Patient/family education Recommendations Recommendations for follow up therapy are one component of a multi-disciplinary discharge planning process, led by the attending physician.  Recommendations may be updated based on patient status, additional functional criteria and insurance authorization. Assessment: Orofacial Exam: Orofacial Exam Oral Cavity: Oral Hygiene: Lingual coating Oral Cavity - Dentition: Adequate natural dentition Orofacial Anatomy: WFL Oral Motor/Sensory Function: WFL Anatomy: No data recorded Boluses Administered: Boluses Administered Boluses Administered: Thin liquids (Level 0); Mildly thick liquids (Level 2, nectar thick); Moderately thick liquids (Level 3, honey thick); Puree; Solid  Oral Impairment Domain: Oral Impairment Domain Lip Closure: Escape beyond mid-chin Tongue control during bolus hold: Escape to lateral buccal cavity/floor of mouth (trace) Bolus preparation/mastication: Slow prolonged chewing/mashing with complete recollection Bolus transport/lingual motion: Delayed initiation of  tongue motion (oral holding) Oral residue: Residue collection on oral structures Location of oral residue : Floor of mouth; Tongue; Lateral sulci (lateral sulci with thin) Initiation of pharyngeal swallow : Valleculae  Pharyngeal Impairment Domain: Pharyngeal Impairment Domain Soft palate elevation: No bolus between soft palate (SP)/pharyngeal wall (PW) Laryngeal elevation: Partial superior movement of thyroid  cartilage/partial approximation of arytenoids to epiglottic petiole Anterior hyoid excursion: Partial anterior movement Epiglottic movement: Complete inversion Laryngeal vestibule closure: Incomplete, narrow column air/contrast in laryngeal vestibule Pharyngeal stripping wave : Present - complete Pharyngeal contraction (A/P view only): N/A Pharyngoesophageal segment opening: Partial distention/partial duration, partial obstruction of flow Tongue base retraction: Trace column of contrast or air between tongue base and PPW Pharyngeal residue: Collection of residue within or on pharyngeal structures Location of pharyngeal residue: Tongue base; Pyriform sinuses; Valleculae  Esophageal Impairment Domain: Esophageal Impairment Domain Esophageal clearance upright position: Complete clearance, esophageal coating Pill: Pill Consistency administered: Puree Puree: WFL Penetration/Aspiration Scale Score: Penetration/Aspiration Scale Score 1.  Material does not enter airway: Solid; Puree 3.  Material enters airway, remains ABOVE vocal cords and not ejected out: Thin liquids (Level 0); Moderately thick liquids (Level 3,  honey thick) 5.  Material enters airway, CONTACTS cords and not ejected out: Thin liquids (Level 0); Moderately thick liquids (Level 3, honey thick) 8.  Material enters airway, passes BELOW cords without attempt by patient to eject out (silent aspiration) : Mildly thick liquids (Level 2, nectar thick) (thinned from residue in mouth and penetrated and aspirated after the swallow) Compensatory Strategies:  Compensatory Strategies Compensatory strategies: Yes Straw: Ineffective Ineffective Straw: Mildly thick liquid (Level 2, nectar thick) Chin tuck: Ineffective Ineffective Chin Tuck: Thin liquid (Level 0)   General Information: Caregiver present: No  Diet Prior to this Study: NPO; Cortrak/Small bore NG tube   Temperature : Normal   Respiratory Status: WFL   Supplemental O2: None (Room air)   History of Recent Intubation: No  Behavior/Cognition: Alert; Cooperative; Pleasant mood; Requires cueing Self-Feeding Abilities: Able to self-feed Baseline vocal quality/speech: Normal Volitional Cough: Able to elicit Volitional Swallow: Able to elicit Exam Limitations: No limitations Goal Planning: Prognosis for improved oropharyngeal function: Good Barriers to Reach Goals: Cognitive deficits No data recorded No data recorded Consulted and agree with results and recommendations: Patient; Physician; Nurse Pain: Pain Assessment Pain Assessment: No/denies pain End of Session: Start Time:SLP Start Time (ACUTE ONLY): 1240 Stop Time: SLP Stop Time (ACUTE ONLY): 1301 Time Calculation:SLP Time Calculation (min) (ACUTE ONLY): 21 min Charges: SLP Evaluations $ SLP Speech Visit: 1 Visit SLP Evaluations $MBS Swallow: 1 Procedure $Swallowing Treatment: 1 Procedure SLP visit diagnosis: SLP Visit Diagnosis: Dysphagia, oropharyngeal phase (R13.12) Past Medical History: Past Medical History: Diagnosis Date  Alcohol abuse 07/24/2010  B12 deficiency   Blood dyscrasia   hemachromatosis  BMI 25.0-25.9,adult 01/28/2015  Bronchitis   Degenerative joint disease (DJD) of hip   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  Depression   Essential hypertension 04/26/2010  Qualifier: Diagnosis of   By: Gwenn Grimes        GERD (gastroesophageal reflux disease)   Gout   Gout 04/26/2010  Qualifier: Diagnosis of   By: Gwenn Grimes        Hemochromatosis 10/03/2013  History of DVT (deep vein thrombosis) 05/15/2016  History of kidney stones   HLD  (hyperlipidemia)   HTN (hypertension)   Hyperlipidemia 04/26/2010  Qualifier: Diagnosis of   By: Gwenn Grimes        Hypokalemia 03/17/2016  Hypothyroidism   Insomnia 02/05/2014  QT prolongation 03/20/2015  Severe alcohol use disorder (HCC) 07/21/2023  Ventricular tachycardia (HCC)   in setting of hypokalemia/ hypomagnesemia and ETOH  Vitamin B12 deficiency 04/26/2010  Qualifier: Diagnosis of   By: Gwenn Grimes        Wears glasses  Past Surgical History: Past Surgical History: Procedure Laterality Date  TOTAL HIP ARTHROPLASTY Right   TOTAL HIP ARTHROPLASTY Left 03/16/2016  Procedure: TOTAL HIP ARTHROPLASTY;  Surgeon: Shari Sieving, MD;  Location: [facility] OR;  Service: Orthopedics;  Laterality: Left;  vascularized fibular graft    right hip for avascular necrosis Dustin Olam Bull 12/02/2023, 3:15 PM  DG Chest Port 1 View Result Date: 12/02/2023 CLINICAL DATA:  141880 SOB (shortness of breath) [id number] EXAM: PORTABLE CHEST 1 VIEW COMPARISON:  December 01, 2023 FINDINGS: The cardiomediastinal silhouette is unchanged in contour. The enteric tube courses through the chest to the abdomen beyond the field-of-view. No pleural effusion. No significant pneumothorax. No acute pleuroparenchymal abnormality. Revisualization of LEFT tenth rib fracture. IMPRESSION: No acute cardiopulmonary abnormality. Electronically Signed   By: Corean Salter M.D.   On: 12/02/2023 07:51     LOS: 7  days   [facility] Applebaum, MD  [facility]    To contact the attending provider between 7A-7P or the covering provider during after hours 7P-7A, please log into the web site [email or web address] and access using universal [place] password for that web site. If you do not have the password, please call the hospital operator.  12/03/2023, 10:57 AM

## 2023-12-03 NOTE — Progress Notes (Signed)
   Inpatient Rehab Admissions Coordinator :  Per therapy change in recommendations, patient was screened for CIR candidacy by Ottie Glazier RN MSN.  At this time patient appears to be a potential candidate for CIR. I will place a rehab consult per protocol for full assessment. Please call me with any questions.  Ottie Glazier RN MSN Admissions Coordinator [phone]

## 2023-12-03 NOTE — Discharge Instructions (Signed)
   Outpatient Substance Abuse  Treatment- uninsured  Narcotics Anonymous 24-HOUR HELPLINE Pre-recorded for Meeting Schedules [facility] [phone]  [email or web address] ALCOHOLICS ANONYMOUS  [place]   Answering Service [phone] Please Note: All [place] Meetings are Non-smoking [email or web address]  Alcohol and Drug Services -  Insurance: Medicaid /State funding/private insurance Methadone, suboxone/Intensive outpatient  [place] [phone] Fax: [phone] [address] [place], [place], [address] [place] [phone] Fax: [phone]    [address] [place], [place], [address] ([address] [place], [place], [place], [place], [place], [place], [place], [place]) [facility] [email or web address] Accepts State funding/Medicaid Transitional housing, Intensive Outpatient Treatment, Outpatient treatment, Veterans Services  Phone: [phone] Fax: [phone] Address: [address] [place] [address]   [employer] [facility] ([email or web address]/) Insurance: Medicaid Case Management, Administrator, arts, Medication Management, Outpatient Therapy, Psychosocial Rehabilitation, Substance Abuse Intensive Outpatient  Phone: [phone] Fax: [phone], [place], [place], [address]  [facility] Medicaid, most private insurance providers Types of Program: Individual/Group Therapy, Substance Abuse Treatment  Phone: [place] [phone] Fax: [phone] [address], [place], [place], [address] [place] [phone] [address], [address] [facility] [place], [place], [address]  [facility]  Medicaid Types of Program: SAIOP  Phone: [phone] Fax: [phone] [address] [place], [place], [place], [address] [facility] Medicaid/state funds Crisis line [phone] [place] [facility] [phone] [place] [phone] [place] [phone]  [facility] [address];  [place], [place] [address] [phone]  Substance Abuse Intensive Outpatient Program [facility] [address] [place], [place] [address] [phone]- Substance abuse treatment  Successful Transitions  Insurance: [facility], [address], sliding scale Types of Program: substance abuse treatment, transportation assistance Phone: [phone] Fax: [phone] Address: [address], [place] [address] [facility] ([email or web address]) Insurance: UHC, [place], [place], [place] of [place] Program: addiction counseling, detoxification,  Phone: [phone]  Fax: [phone] Address: [address], [place] [address]  [facility] (statewide facilities/programs) [address] (Medicaid/state funds) [place], [place] [address]                      [email or web address] [phone] Christian mcalpine- [phone] [place]- [phone] [facility] [place] (2 Locations) (Medicaid/state funds) --[address]  walk in 8:30-12 and 1-2:30 [place], [id number]   [facility]- [phone] --[address] [place], [place] [address]  [phone] walk in 8:30-12 and 2-3:30  [facility] state funds/medicaid [address] [place], [place] [address] [id number] [facility] (Suboxone clinic) Medicaid/state funds  [address] [place], [place] [address] [phone]   [facility]  [address] [place], [place] [address]  [phone] (24 hours) [place]- [address] [place], [place] [address]  [phone] (24 hours) [place]- [address] [place] [phone] [place]- [address] [place] [phone] [place] [address] [place] [phone] [facility]- Medicaid and state funds  [place]- [address] [place], [place] [address] [phone] (24 hours) [place]- [address] [place], [place]  [phone] [facility]- [address] [place], [place] [address] [phone] (24 hours) [place] [address] [place], [place]  [phone] [place]- [address] [place] [phone]

## 2023-12-03 NOTE — Progress Notes (Signed)
 Nutrition Follow-up  DOCUMENTATION CODES:   Non-severe (moderate) malnutrition in context of social or environmental circumstances  INTERVENTION:  Continue short-term nutrition support until mentation improves Continue tube feeding via Cortrak: Osmolite 1.5 at 55 ml/h (1320 ml per day) Prosource TF20 60 ml 1x daily FWF 150 ml q4h (900 ml per day)   Provides 2060 kcal, 103 gm protein, 1906 ml free water daily   Continue thiamine /folic acid /MVI r/t EtOH use Monitor diet advancement/tolerance   NUTRITION DIAGNOSIS:  Moderate Malnutrition related to social / environmental circumstances as evidenced by percent weight loss, moderate muscle depletion, mild fat depletion. -remains applicable  GOAL:  Patient will meet greater than or equal to 90% of their needs - meeting via TF  MONITOR:  PO intake, Supplement acceptance, Diet advancement, Labs, Weight trends  REASON FOR ASSESSMENT:   Consult Assessment of nutrition requirement/status  ASSESSMENT:   Pt with PMH significant for: severe EtOH use disorder with multiple falls over last few months, HTN, HLD, VT, depression, GERD, gout, and B12 deficiency. Presented to [facility] ED with left facial droop after reported fall approximately one week ago where he hit his head. 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  10/6: presented to [facility] ED; MRI brain: multifocal b/l SAH, b/l SDH, cerebral contusions w/ edema/enhancements in the left temporal lobe  10/7: txr to [facility] 10/8: mechanical fall in patient room 10/9: downgraded to DYS1/thins (ONLY WHILE ALERT) 10/10: NPO d/t mentation; Cortrak placed and TF started  10/13: oropharyngeal dysphagia MBS; remains NPO   Patient has achieved goal rate tube feeding over the weekend. Tolerating well, per RN report. Mentation improving, but failed MBS yesterday. Plan is for removal of Cortrak prior to MBS  later in the week. MD amicable to re-placing feeding tube should be not pass. He is sleeping in bed this morning and in no distress. He remains with mitten in place, but no restraints.    Admit Weight: 68.9 kg Current Weight: 70.6 kg   Weight stable. No edema noted. Plan for voiding trial in next 1-2 days.   Drains/Lines: Cortrak (gastric) placed 10/10 Foley catheter UOP: 750 x24 hours - suspect inaccurate, as 12 hours of output not documented   Phenobarbitol taper completed. Also completed course of high dose thiamine  2/2 suspected Korsakoff/Wernicke's. Now on 100mg  daily. Refeeding labs have stabilized.   Meds: folic acid , melatonin, MVI, thiamine , pantoprazole   Labs from 10/13 reviewed:  Na+ 134 (L) K+ 3.2>3.5>4.0 (wdl) PHOS 2.3>1.9>3.2 (wdl) Mg 1.4>1.5>1.7 (wdl) CBGs 118-150 x24 hours A1c 5.3 (07/2023)  Diet Order:   Diet Order             Diet NPO time specified Except for: Sips with Meds  Diet effective now            EDUCATION NEEDS:  Not appropriate for education at this time  Skin:  Skin Assessment: Reviewed RN Assessment  Last BM:  10/13 - type 6 x2  Height:  Ht Readings from Last 1 Encounters:  11/25/23 5' 9 (1.753 m)   Weight:  Wt Readings from Last 1 Encounters:  12/02/23 70.6 kg   Ideal Body Weight:  72.7 kg  BMI:  Body mass index is 22.98 kg/m.  Estimated Nutritional Needs:   Kcal:  1900-2100 kcals  Protein:  90-105g  Fluid:  1.9-2.1L/day  Blair Deaner MS, RD, LDN Registered Dietitian Clinical Nutrition RD Inpatient Contact Info in Amion

## 2023-12-03 NOTE — Plan of Care (Signed)
  Problem: Education: Goal: Knowledge of General Education information will improve Description: Including pain rating scale, medication(s)/side effects and non-pharmacologic comfort measures Outcome: Progressing   Problem: Health Behavior/Discharge Planning: Goal: Ability to manage health-related needs will improve Outcome: Progressing   Problem: Clinical Measurements: Goal: Ability to maintain clinical measurements within normal limits will improve Outcome: Progressing Goal: Will remain free from infection Outcome: Progressing Goal: Diagnostic test results will improve Outcome: Progressing Goal: Respiratory complications will improve Outcome: Progressing   Problem: Nutrition: Goal: Adequate nutrition will be maintained Outcome: Progressing   Problem: Coping: Goal: Level of anxiety will decrease Outcome: Progressing   Problem: Elimination: Goal: Will not experience complications related to urinary retention Outcome: Progressing   Problem: Pain Managment: Goal: General experience of comfort will improve and/or be controlled Outcome: Progressing

## 2023-12-03 NOTE — Plan of Care (Signed)
   Problem: Education: Goal: Knowledge of General Education information will improve Description: Including pain rating scale, medication(s)/side effects and non-pharmacologic comfort measures Outcome: Progressing   Problem: Activity: Goal: Risk for activity intolerance will decrease Outcome: Progressing

## 2023-12-03 NOTE — Progress Notes (Signed)
 Occupational Therapy Treatment Patient Details Name: Christian King MRN: [id number] DOB: Apr 02, 1970 Today's Date: 12/03/2023   History of present illness Patient is a 53 y.o. male presented with left facial droop-upon further evaluation with neuroimaging including MRI brain-patient was found to have findings consistent with traumatic brain injury including multifocal bilateral SAH, bilateral SDH, cerebral contusions with edema/enhancements in the left temporal lobe. Past history of EtOH use-numerous falls recently (per spouse-ongoing for the past several months)   OT comments  Pt making gradual progress towards OT goals though limited by weakness, imbalance and tachycardia with activity (up to 141bpm). Pt requiring Min A x 2 for safety to mobilize in room w/ handheld assist. Pt able to stand at sink for various grooming tasks though reliant on one UE support and cues for sequencing tasks. Pt w/ decreased control of secretions requiring continuous mouth wiping. Based on current deficits and rehab potential, recommend consideration of intensive rehab services upon DC.      If plan is discharge home, recommend the following:  A little help with walking and/or transfers;A little help with bathing/dressing/bathroom;Assistance with cooking/housework;Direct supervision/assist for financial management;Direct supervision/assist for medications management;Assist for transportation;Help with stairs or ramp for entrance;Supervision due to cognitive status   Equipment Recommendations  Other (comment) (RW; TBD)    Recommendations for Other Services Rehab consult    Precautions / Restrictions Precautions Precautions: Fall Recall of Precautions/Restrictions: Impaired Precaution/Restrictions Comments: watch HR, cortrak Restrictions Weight Bearing Restrictions Per Provider Order: No       Mobility Bed Mobility               General bed mobility comments: received standing at bedside with  PT    Transfers Overall transfer level: Needs assistance Equipment used: 1 person hand held assist Transfers: Sit to/from Stand Sit to Stand: Min assist                 Balance Overall balance assessment: Needs assistance Sitting-balance support: Feet supported, No upper extremity supported Sitting balance-Leahy Scale: Fair     Standing balance support: Single extremity supported, During functional activity Standing balance-Leahy Scale: Poor Standing balance comment: reliant on external support to complete ADLs at sink                           ADL either performed or assessed with clinical judgement   ADL Overall ADL's : Needs assistance/impaired     Grooming: Contact guard assist;Minimal assistance;Standing;Oral care;Wash/dry face;Brushing hair Grooming Details (indicate cue type and reason): assist for balance without BUE support, cues to place toothpaste on toothbrush                             Functional mobility during ADLs: Minimal assistance;+2 for safety/equipment;+2 for physical assistance General ADL Comments: Able to walk to sink, stand to complete grooming tasks and mobilize back to bed. Limited by weakness and increasing HR    Extremity/Trunk Assessment Upper Extremity Assessment Upper Extremity Assessment: Generalized weakness;Right hand dominant   Lower Extremity Assessment Lower Extremity Assessment: Defer to PT evaluation        Vision   Vision Assessment?: Vision impaired- to be further tested in functional context   Perception     Praxis     Communication Communication Communication: Impaired Factors Affecting Communication: Reduced clarity of speech   Cognition Arousal: Alert Behavior During Therapy: Restless Cognition: Cognition impaired   Orientation impairments:  Situation Awareness: Intellectual awareness impaired, Online awareness impaired Memory impairment (select all impairments): Working [employer] fast streamer,  Conservation officer, historic buildings, Short-term memory Attention impairment (select first level of impairment): Selective attention Executive functioning impairment (select all impairments): Organization, Sequencing, Reasoning, Problem solving OT - Cognition Comments: following commands fairly well.restless with activity, decreased insight into deficits. 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 slower processing                 Following commands: Impaired Following commands impaired: Follows one step commands with increased time, Follows one step commands inconsistently      Cueing   Cueing Techniques: Verbal cues, Gestural cues, Tactile cues, Visual cues  Exercises      Shoulder Instructions       General Comments Safety sitter present during session. HR to 141    Pertinent Vitals/ Pain       Pain Assessment Pain Assessment: No/denies pain  Home Living                                          Prior Functioning/Environment              Frequency  Min 2X/week        Progress Toward Goals  OT Goals(current goals can now be found in the care plan section)  Progress towards OT goals: Progressing toward goals  Acute Rehab OT Goals Patient Stated Goal: none stated OT Goal Formulation: With patient/family Time For Goal Achievement: 12/11/23 Potential to Achieve Goals: Good ADL Goals Pt Will Perform Grooming: with supervision;standing Pt Will Perform Lower Body Bathing: with supervision;sit to/from stand Pt Will Perform Upper Body Dressing: with modified independence;sitting Pt Will Perform Lower Body Dressing: with supervision;sit to/from stand Pt Will Transfer to Toilet: with supervision;ambulating;regular height toilet Pt Will Perform Toileting - Clothing Manipulation and hygiene: with supervision;sitting/lateral leans;sit to/from stand Additional ADL Goal #1: Patient will participate in  home exercise program to increased B UE fine motor coordination with Supervision and with written HEP provided.  Plan      Co-evaluation                 AM-PAC OT 6 Clicks Daily Activity     Outcome Measure   Help from another person eating meals?: A Little Help from another person taking care of personal grooming?: A Little Help from another person toileting, which includes using toliet, bedpan, or urinal?: A Little Help from another person bathing (including washing, rinsing, drying)?: A Little Help from another person to put on and taking off regular upper body clothing?: A Little Help from another person to put on and taking off regular lower body clothing?: A Little 6 Click Score: 18    End of Session Equipment Utilized During Treatment: Gait belt  OT Visit Diagnosis: Unsteadiness on feet (R26.81);Repeated falls (R29.6);History of falling (Z91.81);Ataxia, unspecified (R27.0);Other symptoms and signs involving cognitive function   Activity Tolerance Patient tolerated treatment well   Patient Left in bed;with call bell/phone within reach;with nursing/sitter in room   Nurse Communication Mobility status (discussed with NT)        Time: 0811-0829 OT Time Calculation (min): 18 min  Charges: OT General Charges $OT Visit: 1 Visit OT Treatments $Self Care/Home Management : 8-22 mins  Mliss NOVAK, OTR/L Acute Rehab Services Office: [phone]   Mliss Fish 12/03/2023,  10:06 AM

## 2023-12-03 NOTE — Progress Notes (Addendum)
  Inpatient Rehabilitation Admissions Coordinator   Met with patient at bedside for rehab assessment. He was fixated on the need for his Anti reflux medications so he could sleep at night. I spoke to his wife by phone. They are separated since September. Wife works and can not provide him 24/7 care after a CIR admit. She has spoken to his family and states his Dad states he will not care for him. Unless caregiver supports can be arranged, he will need SNF. He is not expected to reach independent level to return home alone in the short term.  I will alert TOC, Janee, SW.Other rehab venues should be pursued at this time. Please call me with any questions.   Heron Leavell, RN, MSN Rehab Admissions Coordinator [phone]

## 2023-12-03 NOTE — Progress Notes (Signed)
 Speech Language Pathology Treatment: Dysphagia  Patient Details Name: Christian King MRN: [id number] DOB: 12/06/1970 Today's Date: 12/03/2023 Time: 8397-8384 SLP Time Calculation (min) (ACUTE ONLY): 13 min  Assessment / Plan / Recommendation Clinical Impression  Pt initially hesitant to trial any POs, stating he feels full. Provided education with pt recalling MBS completed previous date as well as recommendations from SLP. Provided oral care and offered bites of pudding, resulting in multiple swallows. Throat clearance was intermittently observed after a delay. After limited trials, pt declined anything further due to reported globus sensation. Pt's cognitive deficits, Cortrak, and baseline GERD are suspected to be impacting his swallowing. Recommend he continue to be NPO except ice chips after oral care with full supervision. SLP will continue following to therapeutically trial purees and assess readiness to repeat MBS.    HPI HPI: 53yo male admitted 11/25/23 after a fall, left facial droop, bilateral SAH and SDH. PMH: severe alcohol use disorder, HLD, depression, GERD, HTN, hypothyroidism, insomnia, hip replacements      SLP Plan  Continue with current plan of care  Patient needs continued Speech Language Pathology Services       Recommendations  Diet recommendations: NPO Medication Administration: Via alternative means                  Oral care QID;Oral care prior to ice chip/H20   Frequent or constant Supervision/Assistance Dysphagia, oropharyngeal phase (R13.12)     Continue with current plan of care     Damien Blumenthal, M.A., CCC-SLP Speech Language Pathology, Acute Rehabilitation Services  Secure Chat preferred [phone]   12/03/2023, 4:48 PM

## 2023-12-04 DIAGNOSIS — E876 Hypokalemia: Secondary | ICD-10-CM | POA: Diagnosis not present

## 2023-12-04 DIAGNOSIS — F10939 Alcohol use, unspecified with withdrawal, unspecified: Secondary | ICD-10-CM | POA: Diagnosis not present

## 2023-12-04 DIAGNOSIS — S2232XA Fracture of one rib, left side, initial encounter for closed fracture: Secondary | ICD-10-CM | POA: Diagnosis not present

## 2023-12-04 LAB — BASIC METABOLIC PANEL WITH GFR
Anion gap: 11 (ref 5–15)
BUN: 9 mg/dL (ref 6–20)
CO2: 28 mmol/L (ref 22–32)
Calcium: 9.3 mg/dL (ref 8.9–10.3)
Chloride: 96 mmol/L — ABNORMAL LOW (ref 98–111)
Creatinine, Ser: 0.66 mg/dL (ref 0.61–1.24)
GFR, Estimated: >60 mL/min (ref >60)
Glucose, Bld: 149 mg/dL — ABNORMAL HIGH (ref 70–99)
Potassium: 3.9 mmol/L (ref 3.5–5.1)
Sodium: 135 mmol/L (ref 135–145)

## 2023-12-04 LAB — CBC
HCT: 34.4 % — ABNORMAL LOW (ref 39.0–52.0)
Hemoglobin: 11.7 g/dL — ABNORMAL LOW (ref 13.0–17.0)
MCH: 34.8 pg — ABNORMAL HIGH (ref 26.0–34.0)
MCHC: 34 g/dL (ref 30.0–36.0)
MCV: 102.4 fL — ABNORMAL HIGH (ref 80.0–100.0)
Platelets: 318 K/uL (ref 150–400)
RBC: 3.36 MIL/uL — ABNORMAL LOW (ref 4.22–5.81)
RDW: 13.2 % (ref 11.5–15.5)
WBC: 8.1 K/uL (ref 4.0–10.5)
nRBC: 0 % (ref 0.0–0.2)

## 2023-12-04 LAB — GLUCOSE, CAPILLARY
Glucose-Capillary: 131 mg/dL — ABNORMAL HIGH (ref 70–99)
Glucose-Capillary: 134 mg/dL — ABNORMAL HIGH (ref 70–99)
Glucose-Capillary: 135 mg/dL — ABNORMAL HIGH (ref 70–99)
Glucose-Capillary: 140 mg/dL — ABNORMAL HIGH (ref 70–99)
Glucose-Capillary: 147 mg/dL — ABNORMAL HIGH (ref 70–99)
Glucose-Capillary: 149 mg/dL — ABNORMAL HIGH (ref 70–99)
Glucose-Capillary: 181 mg/dL — ABNORMAL HIGH (ref 70–99)

## 2023-12-04 LAB — PHOSPHORUS: Phosphorus: 4.3 mg/dL (ref 2.5–4.6)

## 2023-12-04 LAB — MAGNESIUM: Magnesium: 1.6 mg/dL — ABNORMAL LOW (ref 1.7–2.4)

## 2023-12-04 MED ORDER — ADULT MULTIVITAMIN W/MINERALS CH
1.0000 | ORAL_TABLET | Freq: Every day | ORAL | Status: DC
  Administered 2023-12-05 – 2023-12-06 (×2): 1
  Filled 2023-12-04 (×2): qty 1

## 2023-12-04 MED ORDER — POTASSIUM CHLORIDE 20 MEQ PO PACK
20.0000 meq | PACK | Freq: Once | ORAL | Status: AC
  Administered 2023-12-04: 20 meq
  Filled 2023-12-04: qty 1

## 2023-12-04 MED ORDER — ACETAMINOPHEN 325 MG PO TABS
650.0000 mg | ORAL_TABLET | Freq: Four times a day (QID) | ORAL | Status: DC | PRN
  Administered 2023-12-04 – 2023-12-06 (×3): 650 mg
  Filled 2023-12-04 (×3): qty 2

## 2023-12-04 MED ORDER — MAGNESIUM SULFATE 4 GM/100ML IV SOLN
4.0000 g | Freq: Once | INTRAVENOUS | Status: AC
  Administered 2023-12-04: 4 g via INTRAVENOUS
  Filled 2023-12-04: qty 100

## 2023-12-04 MED ORDER — ACETAMINOPHEN 650 MG RE SUPP: 650.0000 mg | Freq: Four times a day (QID) | RECTAL | Status: DC | PRN

## 2023-12-04 MED ORDER — FOLIC ACID 1 MG PO TABS
1.0000 mg | ORAL_TABLET | Freq: Every day | ORAL | Status: DC
  Administered 2023-12-05 – 2023-12-06 (×2): 1 mg
  Filled 2023-12-04 (×2): qty 1

## 2023-12-04 MED ORDER — MELATONIN 5 MG PO TABS
10.0000 mg | ORAL_TABLET | Freq: Every day | ORAL | Status: DC
  Administered 2023-12-04 – 2023-12-06 (×3): 10 mg
  Filled 2023-12-04 (×3): qty 2

## 2023-12-04 NOTE — Progress Notes (Signed)
 Physical Therapy Treatment Patient Details Name: Christian King MRN: [id number] DOB: 12-14-1970 Today's Date: 12/04/2023   History of Present Illness Patient is a 53 y.o. male presented with left facial droop-upon further evaluation with neuroimaging including MRI brain-patient was found to have findings consistent with traumatic brain injury including multifocal bilateral SAH, bilateral SDH, cerebral contusions with edema/enhancements in the left temporal lobe. Past history of EtOH use-numerous falls recently (per spouse-ongoing for the past several months)    PT Comments  Pt with improved alertness and ability to follow commands this date however remains very tangential with hyperfocus on getting his mortgage paid. Pt amb 150 with RW this date with modA for walker management, prevent veering to the L, and to stay in walker. Pt with noted decreased ability to clear R foot leading to decreased step height and length of R LE during swing face. Became more evident with onset of fatigue. Pt continues to present with characteristics of Rancho Level V. Continue to recommend inpatient rehab program > 3 hrs a day to address above deficits and to achieve maximal functional recovery. Acute PT to cont to follow.    If plan is discharge home, recommend the following: A lot of help with walking and/or transfers;A lot of help with bathing/dressing/bathroom;Supervision due to cognitive status;Help with stairs or ramp for entrance;Assist for transportation;Direct supervision/assist for medications management;Direct supervision/assist for financial management;Assistance with cooking/housework   Can travel by private vehicle        Equipment Recommendations  Other (comment) (TBD at next venue)    Recommendations for Other Services Rehab consult     Precautions / Restrictions Precautions Precautions: Fall Recall of Precautions/Restrictions: Impaired Precaution/Restrictions Comments: watch HR,  cortrak Restrictions Weight Bearing Restrictions Per Provider Order: No     Mobility  Bed Mobility Overal bed mobility: Needs Assistance Bed Mobility: Supine to Sit, Sit to Supine     Supine to sit: Min assist Sit to supine: Contact guard assist   General bed mobility comments: pt with good initiation, minA for line management and max directional verbal cues for safety, pt with noted shakiness t/o transfer to EOB    Transfers Overall transfer level: Needs assistance Equipment used: Rolling walker (2 wheels) Transfers: Sit to/from Stand Sit to Stand: Min assist           General transfer comment: despite max verbal and tactile cues to push up from bed patient pulled up on RW requiring PT to push down and anchor walker to floor, minA to power up, noted shakiness    Ambulation/Gait Ambulation/Gait assistance: Mod assist, +2 safety/equipment Gait Distance (Feet): 120 Feet Assistive device: Rolling walker (2 wheels) Gait Pattern/deviations: Step-through pattern, Decreased stride length, Ataxic, Trunk flexed, Wide base of support, Decreased dorsiflexion - right, Decreased step length - right, Decreased stance time - right Gait velocity: dec Gait velocity interpretation: <1.8 ft/sec, indicate of risk for recurrent falls   General Gait Details: pt shaky, short step height and length, pt with PF in R foot and only stepping on ball of R foot and is unable to clear foot for full step. Pt given visual demonstration, tactile, and verbal cues however pt continued to step only on ball of R foot. modA for walker management, veering to the L. pt tangential and with contiuous talking about paying his mortgage and how his Dad and wife (in which they are seperated curently) are trying to pay it but aren't listening to him on where the money is.   Stairs  Wheelchair Mobility     Tilt Bed    Modified Rankin (Stroke Patients Only)       Balance Overall balance  assessment: Needs assistance Sitting-balance support: Feet supported, No upper extremity supported Sitting balance-Leahy Scale: Fair     Standing balance support: Single extremity supported, During functional activity Standing balance-Leahy Scale: Poor Standing balance comment: reliant on external support to complete ADLs at sink                            Communication Communication Communication: Impaired Factors Affecting Communication: Reduced clarity of speech (constant drooling out of L side of mouth)  Cognition Arousal: Alert Behavior During Therapy: Restless   PT - Cognitive impairments: Orientation, Awareness, Sequencing, Problem solving, Safety/Judgement, Rancho level                   Rancho Levels of Cognitive Functioning Rancho Los Amigos Scales of Cognitive Functioning: Confused, Inappropriate Non-Agitated: Maximal Assistance Rancho Mirant Scales of Cognitive Functioning: Confused, Inappropriate Non-Agitated: Maximal Assistance [V] PT - Cognition Comments: pt very tangential and difficult to keep on task requiring freq re-direction however unable to maintain. Pt hyperfocused on paying his mortgage and getting his money from the house. Pt continues to present with decreased insight to safety and deficits. 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 Following commands: Impaired Following commands impaired: Follows one step commands with increased time, Follows one step commands inconsistently    Cueing Cueing Techniques: Verbal cues, Gestural cues, Tactile cues, Visual cues  Exercises      General Comments        Pertinent Vitals/Pain Pain Assessment Pain Assessment: No/denies pain    Home Living                          Prior Function            PT Goals (current goals can now be found in the care plan section)  Acute Rehab PT Goals Patient Stated Goal: to get out of here PT Goal Formulation: With patient Time For Goal Achievement: 12/11/23 Potential to Achieve Goals: Good Progress towards PT goals: Progressing toward goals    Frequency    Min 3X/week      PT Plan      Co-evaluation              AM-PAC PT 6 Clicks Mobility   Outcome Measure  Help needed turning from your back to your side while in a flat bed without using bedrails?: A Little Help needed moving from lying on your back to sitting on the side of a flat bed without using bedrails?: A Little Help needed moving to and from a bed to a chair (including a wheelchair)?: A Lot Help needed standing up from a chair using your arms (e.g., wheelchair or bedside chair)?: A Lot Help needed to walk in hospital room?: Total (distance) Help needed climbing 3-5 steps with a railing? : Total 6 Click Score: 12    End of Session Equipment Utilized During Treatment: Gait belt Activity Tolerance: Patient tolerated treatment well Patient left: in bed;with call bell/phone within reach;with nursing/sitter in room;with bed alarm set Nurse Communication: Mobility status PT Visit Diagnosis: Difficulty in walking, not elsewhere classified (R26.2)     Time: 8679-8664 PT  Time Calculation (min) (ACUTE ONLY): 15 min  Charges:    $Gait Training: 8-22 mins PT General Charges $$ ACUTE PT VISIT: 1 Visit                     Norene Ames, PT, DPT Acute Rehabilitation Services Secure chat preferred Office #: [phone]    Norene CHRISTELLA Ames 12/04/2023, 2:09 PM

## 2023-12-04 NOTE — Progress Notes (Signed)
 PROGRESS NOTE        PATIENT DETAILS Name: Christian King Age: 53 y.o. Sex: male Date of Birth: [birth year] Admit Date: 11/25/2023 Admitting Physician Donalda CHRISTELLA Applebaum, MD ERE:[place], No Pcp Per  Brief Summary: Patient is a 53 y.o.  male with history of EtOH use-numerous falls recently (per spouse-ongoing for the past several months)-presented with left facial droop-upon further evaluation with neuroimaging including MRI brain-patient was found to have findings consistent with traumatic brain injury including multifocal bilateral SAH, bilateral SDH, cerebral contusions with edema/enhancements in the left temporal lobe.  Significant events: 10/6>> presented with facial droop-to [name] 10/7>> transferred to [facility]. 10/8>> mechanical fall after spouse left the room-repeat CT head/maxillofacial-stable. 10/9>>Cortrak tube inserted-somnolent-accumulating secretions-NPO per SLP 10/13>>improved-much more awake/alert  Significant studies: 10/6>> x-ray left hand: No fracture 10/6>> x-ray pelvis/right hip: No fracture. 10/6>> CTA head/neck: No LVO/high-grade stenosis. 10/6>> CT C-spine: No acute abnormality of the C-spine 10/6>> CT maxillofacial: No fracture 10/6>> CT chest: Left 10th rib fracture. 10/7>> MRI brain:  multifocal bilateral SAH, bilateral SDH, cerebral contusions with edema/enhancements in the left temporal lobe. 10/8>> EEG: Negative for seizures. 10/8>> CT head: Stable prior SDH versus hygroma-stable SAH. 10/8>> CT maxillofacial: No facial bone fracture.  Significant microbiology data: 10/7>> blood culture: No growth  Procedures: None  Consults: Neurosurgery Neurology  Subjective: No major issues overnight-lying comfortably in bed-asking questions about how he ended up in the hospital-he does not remember what happened to him during the initial part of his hospital stay.  Tried to reassure him that he had withdrawal symptoms-was on  multiple medications (phenobarb/Ativan ) and he was very somnolent and at times required restraints.  He then asked about NG tube-explained why that was placed-plans for MBS on Friday.  Father at bedside this morning.  Objective: Vitals: Blood pressure 122/73, pulse 92, temperature 100.2 F (37.9 C), temperature source Oral, resp. rate 15, height 5' 9 (1.753 m), weight 70.6 kg, SpO2 96%.   Exam: Gen Exam:Alert awake-not in any distress HEENT:atraumatic, normocephalic Chest: B/L clear to auscultation anteriorly CVS:S1S2 regular Abdomen:soft non tender, non distended Extremities:no edema Neurology: Non focal Skin: no rash  Pertinent Labs/Radiology:    Latest Ref Rng & Units 12/04/2023    3:48 AM 12/02/2023    3:26 AM 11/30/2023    5:33 AM  CBC  WBC 4.0 - 10.5 K/uL 8.1  5.4  5.5   Hemoglobin 13.0 - 17.0 g/dL 88.2  88.2  88.2   Hematocrit 39.0 - 52.0 % 34.4  34.9  33.5   Platelets 150 - 400 K/uL 318  230  195     Lab Results  Component Value Date   NA 135 12/04/2023   K 3.9 12/04/2023   CL 96 (L) 12/04/2023   CO2 28 12/04/2023      Assessment/Plan: TBI with left temporal lobe contusion/edema/bilateral SAH/bilateral SDH Secondary to multiple falls that the patient has sustained in the past several weeks/months-suspect in the context of EtOH intoxication (confirmed by spouse-several days back-and then by father on 10/15). Appreciate neurosurgical input-no surgical indications Appreciated neurology input-initially there was some suspicion for HSV encephalitis but felt unlikely after MRI brain findings.  No need for LP per neurology. Discussed with trauma service-[name]-PA-C on 10/8-care is mostly supportive-PT/OT/SLP eval. Unfortunately-had another mechanical fall 10/8-has been counseled to let nursing staff know before he gets out  of bed.  Discussed with nursing staff-bedside sitter ordered-fall precautions. Currently stable for the past several days-and much more awake  and alert.  Oropharyngeal dysphagia Likely secondary to above-then lately due to confusion associated with ETOH withdrrawls Although may patient much improved-continues to exhibit oropharyngeal dysphagia on MBS done on 10/13 SLP following-plans are to repeat MBS on 10/17-after removing NG tube.  If he demonstrates aspiration issues-NG tube to be reinserted. Maintain strict aspiration precautions.   Left 10th rib fracture Supportive care Incentive spirometry/flutter valve  Hypokalemia Repleted  Hypophosphatemia Repleted  Hypomagnesemia Replete/recheck.  Rhabdomyolysis Mild CK levels have now normalized with supportive care.  Acute urinary retention Foley catheter inserted on admit (per flowsheet-around 800 cc residual urine drained) Continue Foley catheter Continue Flomax Voiding trial in the next day or 2-if patient continues to ambulate/mobilize with PT/OT.  GERD PPI  EtOH withdrawal ?  Hospital delirium versus acute metabolic encephalopathy Much better-MUCH more awake and alert this am. Completed phenobarbital and Ativan  taper. Should be almost out of EtOH withdrawal at this point. Suspicion that given his longstanding history of EtOH use (per spouse-started drinking around 10 years-father/grandfather alcoholics as well)-concerned he probably already had some amount of alcohol related cognitive issues (Korsakoff/Wernicke's) at baseline.  Spouse acknowledges that he did have cognitive issues and used to confabulate prior to this hospitalization. Completed high-dose thiamine  x 5 days on 10/12-and now on 100 mg thiamine  daily  Nutrition Status: Nutrition Problem: Moderate Malnutrition Etiology: social / environmental circumstances Signs/Symptoms: percent weight loss, moderate muscle depletion, mild fat depletion Percent weight loss: 13.3 % Interventions: Ensure Enlive (each supplement provides 350kcal and 20 grams of protein), MVI, Refer to RD note for recommendations,  Calorie Count    Code status:   Code Status: Full Code   DVT Prophylaxis: SCDs Start: 11/26/23 9661   Family Communication:  Father at bedside on 10/15. Spouse-Ravonda Cy Fallow [phone] updated 10/14   Disposition Plan: Status is: Inpatient Remains inpatient appropriate because: Severity of illness   Planned Discharge Destination:Home health versus rehab facility   Diet: Diet Order             Diet NPO time specified Except for: Sips with Meds  Diet effective now                     Antimicrobial agents: Anti-infectives (From admission, onward)    Start     Dose/Rate Route Frequency Ordered Stop   11/26/23 0000  acyclovir (ZOVIRAX) 700 mg in dextrose  5 % 100 mL IVPB  Status:  Discontinued        700 mg 114 mL/hr over 60 Minutes Intravenous Every 8 hours 11/25/23 2334 11/27/23 0346        MEDICATIONS: Scheduled Meds:  Chlorhexidine  Gluconate Cloth  6 each Topical Daily   feeding supplement (PROSource TF20)  60 mL Per Tube Daily   [START ON 12/05/2023] folic acid   1 mg Per Tube Daily   free water  150 mL Per Tube Q4H   lidocaine   1 patch Transdermal Q24H   melatonin  10 mg Per Tube QHS   [START ON 12/05/2023] multivitamin with minerals  1 tablet Per Tube Daily   pantoprazole  (PROTONIX ) IV  40 mg Intravenous Q12H   QUEtiapine  50 mg Per Tube QHS   scopolamine  1 patch Transdermal Q72H   tamsulosin  0.4 mg Oral Daily   thiamine  (VITAMIN B1) injection  100 mg Intravenous Daily   Continuous Infusions:  feeding supplement (OSMOLITE  1.5 CAL) 55 mL/hr at 12/04/23 0418   PRN Meds:.acetaminophen  **OR** acetaminophen , alum & mag hydroxide-simeth, dextrose , HYDROmorphone  (DILAUDID ) injection, ipratropium-albuterol , ondansetron  **OR** ondansetron  (ZOFRAN ) IV, phenol   I have personally reviewed following labs and imaging studies  LABORATORY DATA: CBC: Recent Labs  Lab 11/28/23 0220 11/30/23 0533 12/02/23 0326 12/04/23 0348  WBC 7.3 5.5 5.4 8.1   HGB 11.8* 11.7* 11.7* 11.7*  HCT 33.8* 33.5* 34.9* 34.4*  MCV 102.4* 100.9* 102.9* 102.4*  PLT 189 195 230 318    Basic Metabolic Panel: Recent Labs  Lab 11/28/23 0220 11/29/23 0246 11/30/23 0533 12/01/23 0516 12/02/23 0326 12/04/23 0348  NA 133* 134* 133* 133* 134* 135  K 3.3* 3.4* 3.2* 3.5 4.0 3.9  CL 97* 96* 95* 99 99 96*  CO2 21* 22 26 25 23 28   GLUCOSE 95 80 120* 150* 118* 149*  BUN <5* <5* <5* 5* 9 9  CREATININE 0.68 0.81 0.57* 0.59* 0.61 0.66  CALCIUM  8.4* 8.8* 9.0 9.0 9.0 9.3  MG 1.6* 1.3* 1.4* 1.5* 1.7 1.6*  PHOS 3.2  --  3.0  --   --  4.3    GFR: Estimated Creatinine Clearance: 104.2 mL/min (by C-G formula based on SCr of 0.66 mg/dL).  Liver Function Tests: No results for input(s): AST, ALT, ALKPHOS, BILITOT, PROT, ALBUMIN in the last 168 hours.  No results for input(s): LIPASE, AMYLASE in the last 168 hours. Recent Labs  Lab 11/30/23 0533  AMMONIA 38*    Coagulation Profile: No results for input(s): INR, PROTIME in the last 168 hours.   Cardiac Enzymes: No results for input(s): CKTOTAL, CKMB, CKMBINDEX, TROPONINI in the last 168 hours.   BNP (last 3 results) No results for input(s): PROBNP in the last 8760 hours.  Lipid Profile: No results for input(s): CHOL, HDL, LDLCALC, TRIG, CHOLHDL, LDLDIRECT in the last 72 hours.  Thyroid  Function Tests: No results for input(s): TSH, T4TOTAL, FREET4, T3FREE, THYROIDAB in the last 72 hours.  Anemia Panel: No results for input(s): VITAMINB12, FOLATE, FERRITIN, TIBC, IRON, RETICCTPCT in the last 72 hours.  Urine analysis:    Component Value Date/Time   COLORURINE STRAW (A) 05/22/2020 0044   APPEARANCEUR CLEAR 05/22/2020 0044   LABSPEC 1.009 05/22/2020 0044   PHURINE 6.0 05/22/2020 0044   GLUCOSEU NEGATIVE 05/22/2020 0044   HGBUR SMALL (A) 05/22/2020 0044   BILIRUBINUR NEGATIVE 05/22/2020 0044   KETONESUR NEGATIVE 05/22/2020 0044    PROTEINUR NEGATIVE 05/22/2020 0044   NITRITE NEGATIVE 05/22/2020 0044   LEUKOCYTESUR NEGATIVE 05/22/2020 0044    Sepsis Labs: Lactic Acid, Venous    Component Value Date/Time   LATICACIDVEN 2.2 (HH) 05/23/2020 1216    MICROBIOLOGY: Recent Results (from the past 240 hours)  Blood culture (routine x 2)     Status: None   Collection Time: 11/26/23 12:59 AM   Specimen: BLOOD  Result Value Ref Range Status   Specimen Description BLOOD BLOOD LEFT ARM  Final   Special Requests   Final    BOTTLES DRAWN AEROBIC AND ANAEROBIC Blood Culture adequate volume   Culture   Final    NO GROWTH 5 DAYS Performed at [facility], [address], [place], [place] [address]    Report Status 12/01/2023 FINAL  Final  Blood culture (routine x 2)     Status: None   Collection Time: 11/26/23  1:02 AM   Specimen: BLOOD  Result Value Ref Range Status   Specimen Description BLOOD BLOOD LEFT HAND  Final   Special Requests  AEROBIC BOTTLE ONLY Blood Culture adequate volume  Final   Culture   Final    NO GROWTH 5 DAYS Performed at [facility], [address], [place], [place] [address]    Report Status 12/01/2023 FINAL  Final    RADIOLOGY STUDIES/RESULTS: DG Swallowing Func-Speech Pathology Result Date: 12/02/2023 Table formatting from the original result was not included. Modified Barium Swallow Study Patient Details Name: Christian King MRN: [id number] Date of Birth: 1970-11-17 Today's Date: 12/02/2023 HPI/PMH: HPI: 53yo male admitted 11/25/23 after a fall, left facial droop, bilateral SAH and SDH. PMH: severe alcohol use disorder, HLD, depression, GERD, HTN, hypothyroidism, insomnia, hip replacements Clinical Impression: Clinical Impression: Pt presents with moderate oropharyngeal dysphagia. Reduced oral control/cohesion, sensation and inability to clear oral cavity with significant pooling of secretions in anterior sulci throughout the study. Episode of silent aspiration with nectar thick suspected  after  the swallow and was observed when fluoro turned on with cued cough appearing to clear trachea. During initial swallows his epiglottis adequately inverted however there was consistent penetration during multiple subsequent swallows attempting to clear oral/pharyngeal residue of thin (PAS 3,5), nectar and honey thick (PAS 3.5) with suspicion of feeding tube impacting inversion. Postural strategy was ineffective to prevent penetration and cues to cough only temporarily cleared vestibule. There was no esophageal retention observed. Recommend continue NPO, allowing ice chips after oral care. Recommend po trials with puree consistency with ST only for facilitation of musculature/oral clearance and removing Cortrak prior to repeat MBS. Factors that may increase risk of adverse event in presence of aspiration Noe & Lianne 2021): Factors that may increase risk of adverse event in presence of aspiration Noe & Lianne 2021): Reduced cognitive function Recommendations/Plan: Swallowing Evaluation Recommendations Swallowing Evaluation Recommendations Recommendations: NPO (except ice chips) Medication Administration: Via alternative means Oral care recommendations: Oral care QID (4x/day); Oral care before ice chips/water Treatment Plan Treatment Plan Treatment recommendations: Therapy as outlined in treatment plan below Follow-up recommendations: -- (TBD) Functional status assessment: Patient has had a recent decline in their functional status and demonstrates the ability to make significant improvements in function in a reasonable and predictable amount of time. Treatment frequency: Min 2x/week Interventions: Trials of upgraded texture/liquids; Patient/family education Recommendations Recommendations for follow up therapy are one component of a multi-disciplinary discharge planning process, led by the attending physician.  Recommendations may be updated based on patient status, additional functional criteria and  insurance authorization. Assessment: Orofacial Exam: Orofacial Exam Oral Cavity: Oral Hygiene: Lingual coating Oral Cavity - Dentition: Adequate natural dentition Orofacial Anatomy: WFL Oral Motor/Sensory Function: WFL Anatomy: No data recorded Boluses Administered: Boluses Administered Boluses Administered: Thin liquids (Level 0); Mildly thick liquids (Level 2, nectar thick); Moderately thick liquids (Level 3, honey thick); Puree; Solid  Oral Impairment Domain: Oral Impairment Domain Lip Closure: Escape beyond mid-chin Tongue control during bolus hold: Escape to lateral buccal cavity/floor of mouth (trace) Bolus preparation/mastication: Slow prolonged chewing/mashing with complete recollection Bolus transport/lingual motion: Delayed initiation of tongue motion (oral holding) Oral residue: Residue collection on oral structures Location of oral residue : Floor of mouth; Tongue; Lateral sulci (lateral sulci with thin) Initiation of pharyngeal swallow : Valleculae  Pharyngeal Impairment Domain: Pharyngeal Impairment Domain Soft palate elevation: No bolus between soft palate (SP)/pharyngeal wall (PW) Laryngeal elevation: Partial superior movement of thyroid  cartilage/partial approximation of arytenoids to epiglottic petiole Anterior hyoid excursion: Partial anterior movement Epiglottic movement: Complete inversion Laryngeal vestibule closure: Incomplete, narrow column air/contrast in laryngeal vestibule Pharyngeal stripping wave : Present -  complete Pharyngeal contraction (A/P view only): N/A Pharyngoesophageal segment opening: Partial distention/partial duration, partial obstruction of flow Tongue base retraction: Trace column of contrast or air between tongue base and PPW Pharyngeal residue: Collection of residue within or on pharyngeal structures Location of pharyngeal residue: Tongue base; Pyriform sinuses; Valleculae  Esophageal Impairment Domain: Esophageal Impairment Domain Esophageal clearance upright position:  Complete clearance, esophageal coating Pill: Pill Consistency administered: Puree Puree: WFL Penetration/Aspiration Scale Score: Penetration/Aspiration Scale Score 1.  Material does not enter airway: Solid; Puree 3.  Material enters airway, remains ABOVE vocal cords and not ejected out: Thin liquids (Level 0); Moderately thick liquids (Level 3, honey thick) 5.  Material enters airway, CONTACTS cords and not ejected out: Thin liquids (Level 0); Moderately thick liquids (Level 3, honey thick) 8.  Material enters airway, passes BELOW cords without attempt by patient to eject out (silent aspiration) : Mildly thick liquids (Level 2, nectar thick) (thinned from residue in mouth and penetrated and aspirated after the swallow) Compensatory Strategies: Compensatory Strategies Compensatory strategies: Yes Straw: Ineffective Ineffective Straw: Mildly thick liquid (Level 2, nectar thick) Chin tuck: Ineffective Ineffective Chin Tuck: Thin liquid (Level 0)   General Information: Caregiver present: No  Diet Prior to this Study: NPO; Cortrak/Small bore NG tube   Temperature : Normal   Respiratory Status: WFL   Supplemental O2: None (Room air)   History of Recent Intubation: No  Behavior/Cognition: Alert; Cooperative; Pleasant mood; Requires cueing Self-Feeding Abilities: Able to self-feed Baseline vocal quality/speech: Normal Volitional Cough: Able to elicit Volitional Swallow: Able to elicit Exam Limitations: No limitations Goal Planning: Prognosis for improved oropharyngeal function: Good Barriers to Reach Goals: Cognitive deficits No data recorded No data recorded Consulted and agree with results and recommendations: Patient; Physician; Nurse Pain: Pain Assessment Pain Assessment: No/denies pain End of Session: Start Time:SLP Start Time (ACUTE ONLY): 1240 Stop Time: SLP Stop Time (ACUTE ONLY): 1301 Time Calculation:SLP Time Calculation (min) (ACUTE ONLY): 21 min Charges: SLP Evaluations $ SLP Speech Visit: 1 Visit SLP  Evaluations $MBS Swallow: 1 Procedure $Swallowing Treatment: 1 Procedure SLP visit diagnosis: SLP Visit Diagnosis: Dysphagia, oropharyngeal phase (R13.12) Past Medical History: Past Medical History: Diagnosis Date  Alcohol abuse 07/24/2010  B12 deficiency   Blood dyscrasia   hemachromatosis  BMI 25.0-25.9,adult 01/28/2015  Bronchitis   Degenerative joint disease (DJD) of hip   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  Depression   Essential hypertension 04/26/2010  Qualifier: Diagnosis of   By: Gwenn Grimes        GERD (gastroesophageal reflux disease)   Gout   Gout 04/26/2010  Qualifier: Diagnosis of   By: Gwenn Grimes        Hemochromatosis 10/03/2013  History of DVT (deep vein thrombosis) 05/15/2016  History of kidney stones   HLD (hyperlipidemia)   HTN (hypertension)   Hyperlipidemia 04/26/2010  Qualifier: Diagnosis of   By: Gwenn Grimes        Hypokalemia 03/17/2016  Hypothyroidism   Insomnia 02/05/2014  QT prolongation 03/20/2015  Severe alcohol use disorder (HCC) 07/21/2023  Ventricular tachycardia (HCC)   in setting of hypokalemia/ hypomagnesemia and ETOH  Vitamin B12 deficiency 04/26/2010  Qualifier: Diagnosis of   By: Gwenn Grimes        Wears glasses  Past Surgical History: Past Surgical History: Procedure Laterality Date  TOTAL HIP ARTHROPLASTY Right   TOTAL HIP ARTHROPLASTY Left 03/16/2016  Procedure: TOTAL HIP ARTHROPLASTY;  Surgeon: Shari Sieving, MD;  Location: [facility] OR;  Service: Orthopedics;  Laterality:  Left;  vascularized fibular graft    right hip for avascular necrosis Dustin Olam Bull 12/02/2023, 3:15 PM    LOS: 8 days   Donalda Applebaum, MD  [facility]    To contact the attending provider between 7A-7P or the covering provider during after hours 7P-7A, please log into the web site [email or web address] and access using universal [place] password for that web site. If you do not have the password, please call the hospital operator.  12/04/2023,  12:19 PM

## 2023-12-05 LAB — BASIC METABOLIC PANEL WITH GFR
Anion gap: 11 (ref 5–15)
BUN: 11 mg/dL (ref 6–20)
CO2: 26 mmol/L (ref 22–32)
Calcium: 8.9 mg/dL (ref 8.9–10.3)
Chloride: 96 mmol/L — ABNORMAL LOW (ref 98–111)
Creatinine, Ser: 0.59 mg/dL — ABNORMAL LOW (ref 0.61–1.24)
GFR, Estimated: >60 mL/min (ref >60)
Glucose, Bld: 156 mg/dL — ABNORMAL HIGH (ref 70–99)
Potassium: 3.7 mmol/L (ref 3.5–5.1)
Sodium: 133 mmol/L — ABNORMAL LOW (ref 135–145)

## 2023-12-05 LAB — GLUCOSE, CAPILLARY
Glucose-Capillary: 168 mg/dL — ABNORMAL HIGH (ref 70–99)
Glucose-Capillary: 136 mg/dL — ABNORMAL HIGH (ref 70–99)
Glucose-Capillary: 115 mg/dL — ABNORMAL HIGH (ref 70–99)
Glucose-Capillary: 125 mg/dL — ABNORMAL HIGH (ref 70–99)
Glucose-Capillary: 118 mg/dL — ABNORMAL HIGH (ref 70–99)
Glucose-Capillary: 181 mg/dL — ABNORMAL HIGH (ref 70–99)

## 2023-12-05 LAB — MAGNESIUM: Magnesium: 2.1 mg/dL (ref 1.7–2.4)

## 2023-12-05 MED ORDER — POLYETHYLENE GLYCOL 3350 17 G PO PACK: 17.0000 g | PACK | Freq: Every day | ORAL | Status: DC | PRN

## 2023-12-05 MED ORDER — MELATONIN 5 MG PO TABS: 5.0000 mg | ORAL_TABLET | Freq: Every evening | ORAL | Status: DC | PRN

## 2023-12-05 NOTE — Progress Notes (Signed)
 Speech Language Pathology Treatment: Dysphagia;Cognitive-Linguistic  Patient Details Name: Christian King MRN: [id number] DOB: 13-Feb-1971 Today's Date: 12/05/2023 Time: 8356-8298 SLP Time Calculation (min) (ACUTE ONLY): 18 min  Assessment / Plan / Recommendation Clinical Impression  Pt continues to exhibit pragmatic deficits with reduced awareness of need for turn taking and topic maintenance. He does recall recent hospital events more accurately and asks appropriate questions regarding plan to remove Cortrak for repeat MBS tomorrow. He fed himself bites of puree today, initiating prompt oral clearance. Wet vocal quality and throat clearance were noted after ~5 bites. Pt independently clears his throat and reswallows, which improves vocal quality. Continue allowing ice chips in moderation with frequent oral care and SLP will f/u for MBS next date to provide updated recommendations.    HPI HPI: 53yo male admitted 11/25/23 after a fall, left facial droop, bilateral SAH and SDH. PMH: severe alcohol use disorder, HLD, depression, GERD, HTN, hypothyroidism, insomnia, hip replacements      SLP Plan  MBS          Recommendations  Diet recommendations: NPO Medication Administration: Via alternative means                  Oral care QID;Oral care prior to ice chip/H20   Frequent or constant Supervision/Assistance Dysphagia, oropharyngeal phase (R13.12)     MBS     Damien Blumenthal, M.A., CCC-SLP Speech Language Pathology, Acute Rehabilitation Services  Secure Chat preferred [phone]   12/05/2023, 5:05 PM

## 2023-12-05 NOTE — Plan of Care (Signed)

## 2023-12-05 NOTE — TOC Progression Note (Signed)
 Transition of Care [facility]) - Progression Note    Patient Details  Name: Christian King MRN: [id number] Date of Birth: 1970-03-22  [facility] CM/SW Contact  Inocente GORMAN Kindle, LCSW Phone Number: 12/05/2023, 9:53 AM  Clinical Narrative:    CSW continuing to follow for SNF referrals once Cortrak is removed.    Expected Discharge Plan:  (to be determined) Barriers to Discharge: Continued Medical Work up               Expected Discharge Plan and Services In-house Referral: Artist Discharge Planning Services: CM Consult   Living arrangements for the past 2 months: Single Family Home                 DME Arranged: N/A DME Agency: NA       HH Arranged: NA HH Agency: NA         Social Drivers of Health (SDOH) Interventions SDOH Screenings   Food Insecurity: No Food Insecurity (11/27/2023)  Housing: Low Risk  (11/27/2023)  Transportation Needs: No Transportation Needs (11/27/2023)  Utilities: Not At Risk (11/27/2023)  Alcohol Screen: High Risk (11/10/2023)  Depression (PHQ2-9): Low Risk  (11/27/2022)  Financial Resource Strain: Low Risk  (11/27/2022)  Physical Activity: Insufficiently Active (11/27/2022)  Social Connections: Unknown (07/21/2023)  Stress: No Stress Concern Present (11/27/2022)  Tobacco Use: High Risk (11/25/2023)    Readmission Risk Interventions     No data to display

## 2023-12-05 NOTE — Progress Notes (Signed)
 PROGRESS NOTE        PATIENT DETAILS Name: Christian [facility] Age: 53 y.o. Sex: male Date of Birth: [birth year] Admit Date: 11/25/2023 Admitting Physician Donalda CHRISTELLA Applebaum, MD ERE:[place], No Pcp Per  Brief Summary: Patient is a 53 y.o.  male with history of EtOH use-numerous falls recently (per spouse-ongoing for the past several months)-presented with left facial droop-upon further evaluation with neuroimaging including MRI brain-patient was found to have findings consistent with traumatic brain injury including multifocal bilateral SAH, bilateral SDH, cerebral contusions with edema/enhancements in the left temporal lobe.  Patient was admitted and managed with supportive care-Hospital course was complicated by alcohol withdrawal/somnolence/oropharyngeal dysphagia requiring Cortrak tube.  Patient was managed with phenobarb/Ativan  taper-he slowly improved and on 10/13-was much more awake and alert.  See below for further details.  Significant events: 10/6>> presented with facial droop-to [facility] 10/7>> transferred to [facility]. 10/8>> mechanical fall after spouse left the room-repeat CT head/maxillofacial-stable. 10/9>>Cortrak tube inserted-somnolent-accumulating secretions-NPO per SLP 10/13>>improved-much more awake/alert  Significant studies: 10/6>> x-ray left hand: No fracture 10/6>> x-ray pelvis/right hip: No fracture. 10/6>> CTA head/neck: No LVO/high-grade stenosis. 10/6>> CT C-spine: No acute abnormality of the C-spine 10/6>> CT maxillofacial: No fracture 10/6>> CT chest: Left 10th rib fracture. 10/7>> MRI brain:  multifocal bilateral SAH, bilateral SDH, cerebral contusions with edema/enhancements in the left temporal lobe. 10/8>> EEG: Negative for seizures. 10/8>> CT head: Stable prior SDH versus hygroma-stable SAH. 10/8>> CT maxillofacial: No facial bone fracture.  Significant microbiology data: 10/7>> blood culture: No  growth  Procedures: None  Consults: Neurosurgery Neurology  Subjective: Continues to improve-doing well-no major issues overnight-completely awake and alert this morning.  Still inquiring as to why he does not have any recollection of what happened to him for a week when he was going through withdrawals.  Understands that we will remove his Foley and his NG tube tomorrow.  No family at bedside this morning.  Objective: Vitals: Blood pressure 119/65, pulse 86, temperature 97.6 F (36.4 C), temperature source Oral, resp. rate 16, height 5' 9 (1.753 m), weight 69.9 kg, SpO2 100%.   Exam: Gen Exam:Alert awake-not in any distress HEENT:atraumatic, normocephalic Chest: B/L clear to auscultation anteriorly CVS:S1S2 regular Abdomen:soft non tender, non distended Extremities:no edema Neurology: Non focal Skin: no rash  Pertinent Labs/Radiology:    Latest Ref Rng & Units 12/04/2023    3:48 AM 12/02/2023    3:26 AM 11/30/2023    5:33 AM  CBC  WBC 4.0 - 10.5 K/uL 8.1  5.4  5.5   Hemoglobin 13.0 - 17.0 g/dL 88.2  88.2  88.2   Hematocrit 39.0 - 52.0 % 34.4  34.9  33.5   Platelets 150 - 400 K/uL 318  230  195     Lab Results  Component Value Date   NA 133 (L) 12/05/2023   K 3.7 12/05/2023   CL 96 (L) 12/05/2023   CO2 26 12/05/2023      Assessment/Plan: TBI with left temporal lobe contusion/edema/bilateral SAH/bilateral SDH Secondary to multiple falls that the patient has sustained in the past several weeks/months-suspect in the context of EtOH intoxication (confirmed by spouse-several days back-and then by father on 10/15). Appreciate neurosurgical input-no surgical indications Appreciated neurology input-initially there was some suspicion for HSV encephalitis but felt unlikely after MRI brain findings.  No need for LP per neurology. Discussed with  trauma service-[name]-PA-C on 10/8-care is mostly supportive-PT/OT/SLP eval. Unfortunately-had another mechanical fall  10/8-has been counseled to let nursing staff know before he gets out of bed.  Discussed with nursing staff-bedside sitter ordered-fall precautions. Currently stable for the past several days-and much more awake and alert.  Oropharyngeal dysphagia Likely secondary to above-then lately due to confusion associated with ETOH withdrrawls Although may patient much improved-continues to exhibit oropharyngeal dysphagia on MBS done on 10/13 SLP following-plans are to repeat MBS on 10/17-after removing NG tube.  If he demonstrates aspiration issues-NG tube to be reinserted. Maintain strict aspiration precautions.   Left 10th rib fracture Supportive care Incentive spirometry/flutter valve  Hypokalemia Repleted  Hypophosphatemia Repleted  Hypomagnesemia Replete/recheck.  Rhabdomyolysis Mild CK levels have now normalized with supportive care.  Acute urinary retention Foley catheter inserted on admit (per flowsheet-around 800 cc residual urine drained) Continue Foley catheter Continue Flomax Voiding trial in the next day or 2-if patient continues to ambulate/mobilize with PT/OT.  GERD PPI  EtOH withdrawal ?  Hospital delirium versus acute metabolic encephalopathy MUCH more awake and alert on starting on 10/13-stable for the past several days and continues to improve. Completed phenobarbital and Ativan  taper. Should be out of EtOH withdrawal at this point. Suspicion that given his longstanding history of EtOH use (per spouse-started drinking around 10 years-father/grandfather alcoholics as well)-concerned he probably already had some amount of alcohol related cognitive issues (Korsakoff/Wernicke's) at baseline.  Spouse acknowledges that he did have cognitive issues and used to confabulate prior to this hospitalization. Completed high-dose thiamine  x 5 days on 10/12-and now on 100 mg thiamine  daily  Nutrition Status: Nutrition Problem: Moderate Malnutrition Etiology: social / environmental  circumstances Signs/Symptoms: percent weight loss, moderate muscle depletion, mild fat depletion Percent weight loss: 13.3 % Interventions: Ensure Enlive (each supplement provides 350kcal and 20 grams of protein), MVI, Refer to RD note for recommendations, Calorie Count    Code status:   Code Status: Full Code   DVT Prophylaxis: SCDs Start: 11/26/23 9661   Family Communication:  Father at bedside on 10/15. Spouse-[facility] [phone] updated 10/14   Disposition Plan: Status is: Inpatient Remains inpatient appropriate because: Severity of illness   Planned Discharge Destination:Home health versus rehab facility   Diet: Diet Order             Diet NPO time specified Except for: Sips with Meds  Diet effective now                     Antimicrobial agents: Anti-infectives (From admission, onward)    Start     Dose/Rate Route Frequency Ordered Stop   11/26/23 0000  acyclovir (ZOVIRAX) 700 mg in dextrose  5 % 100 mL IVPB  Status:  Discontinued        700 mg 114 mL/hr over 60 Minutes Intravenous Every 8 hours 11/25/23 2334 11/27/23 0346        MEDICATIONS: Scheduled Meds:  Chlorhexidine  Gluconate Cloth  6 each Topical Daily   feeding supplement (PROSource TF20)  60 mL Per Tube Daily   folic acid   1 mg Per Tube Daily   free water  150 mL Per Tube Q4H   lidocaine   1 patch Transdermal Q24H   melatonin  10 mg Per Tube QHS   multivitamin with minerals  1 tablet Per Tube Daily   pantoprazole  (PROTONIX ) IV  40 mg Intravenous Q12H   QUEtiapine  50 mg Per Tube QHS   scopolamine  1 patch Transdermal  Q72H   tamsulosin  0.4 mg Oral Daily   thiamine  (VITAMIN B1) injection  100 mg Intravenous Daily   Continuous Infusions:  feeding supplement (OSMOLITE 1.5 CAL) 1,000 mL (12/04/23 1547)   PRN Meds:.acetaminophen  **OR** acetaminophen , alum & mag hydroxide-simeth, dextrose , HYDROmorphone  (DILAUDID ) injection, ipratropium-albuterol , ondansetron  **OR** ondansetron   (ZOFRAN ) IV, phenol   I have personally reviewed following labs and imaging studies  LABORATORY DATA: CBC: Recent Labs  Lab 11/30/23 0533 12/02/23 0326 12/04/23 0348  WBC 5.5 5.4 8.1  HGB 11.7* 11.7* 11.7*  HCT 33.5* 34.9* 34.4*  MCV 100.9* 102.9* 102.4*  PLT 195 230 318    Basic Metabolic Panel: Recent Labs  Lab 11/30/23 0533 12/01/23 0516 12/02/23 0326 12/04/23 0348 12/05/23 0256  NA 133* 133* 134* 135 133*  K 3.2* 3.5 4.0 3.9 3.7  CL 95* 99 99 96* 96*  CO2 26 25 23 28 26   GLUCOSE 120* 150* 118* 149* 156*  BUN <5* 5* 9 9 11   CREATININE 0.57* 0.59* 0.61 0.66 0.59*  CALCIUM  9.0 9.0 9.0 9.3 8.9  MG 1.4* 1.5* 1.7 1.6* 2.1  PHOS 3.0  --   --  4.3  --     GFR: Estimated Creatinine Clearance: 103.2 mL/min (A) (by C-G formula based on SCr of 0.59 mg/dL (L)).  Liver Function Tests: No results for input(s): AST, ALT, ALKPHOS, BILITOT, PROT, ALBUMIN in the last 168 hours.  No results for input(s): LIPASE, AMYLASE in the last 168 hours. Recent Labs  Lab 11/30/23 0533  AMMONIA 38*    Coagulation Profile: No results for input(s): INR, PROTIME in the last 168 hours.   Cardiac Enzymes: No results for input(s): CKTOTAL, CKMB, CKMBINDEX, TROPONINI in the last 168 hours.   BNP (last 3 results) No results for input(s): PROBNP in the last 8760 hours.  Lipid Profile: No results for input(s): CHOL, HDL, LDLCALC, TRIG, CHOLHDL, LDLDIRECT in the last 72 hours.  Thyroid  Function Tests: No results for input(s): TSH, T4TOTAL, FREET4, T3FREE, THYROIDAB in the last 72 hours.  Anemia Panel: No results for input(s): VITAMINB12, FOLATE, FERRITIN, TIBC, IRON, RETICCTPCT in the last 72 hours.  Urine analysis:    Component Value Date/Time   COLORURINE STRAW (A) 05/22/2020 0044   APPEARANCEUR CLEAR 05/22/2020 0044   LABSPEC 1.009 05/22/2020 0044   PHURINE 6.0 05/22/2020 0044   GLUCOSEU NEGATIVE 05/22/2020 0044    HGBUR SMALL (A) 05/22/2020 0044   BILIRUBINUR NEGATIVE 05/22/2020 0044   KETONESUR NEGATIVE 05/22/2020 0044   PROTEINUR NEGATIVE 05/22/2020 0044   NITRITE NEGATIVE 05/22/2020 0044   LEUKOCYTESUR NEGATIVE 05/22/2020 0044    Sepsis Labs: Lactic Acid, Venous    Component Value Date/Time   LATICACIDVEN 2.2 (HH) 05/23/2020 1216    MICROBIOLOGY: Recent Results (from the past 240 hours)  Blood culture (routine x 2)     Status: None   Collection Time: 11/26/23 12:59 AM   Specimen: BLOOD  Result Value Ref Range Status   Specimen Description BLOOD BLOOD LEFT ARM  Final   Special Requests   Final    BOTTLES DRAWN AEROBIC AND ANAEROBIC Blood Culture adequate volume   Culture   Final    NO GROWTH 5 DAYS Performed at [facility], [address], [place], [place] [address]    Report Status 12/01/2023 FINAL  Final  Blood culture (routine x 2)     Status: None   Collection Time: 11/26/23  1:02 AM   Specimen: BLOOD  Result Value Ref Range Status   Specimen Description  BLOOD BLOOD LEFT HAND  Final   Special Requests AEROBIC BOTTLE ONLY Blood Culture adequate volume  Final   Culture   Final    NO GROWTH 5 DAYS Performed at [facility], [address], [place], [place] [address]    Report Status 12/01/2023 FINAL  Final    RADIOLOGY STUDIES/RESULTS: No results found.    LOS: 9 days   Donalda Applebaum, MD  [facility]    To contact the attending provider between 7A-7P or the covering provider during after hours 7P-7A, please log into the web site [email or web address] and access using universal [place] password for that web site. If you do not have the password, please call the hospital operator.  12/05/2023, 11:53 AM

## 2023-12-06 ENCOUNTER — Inpatient Hospital Stay (HOSPITAL_COMMUNITY)

## 2023-12-06 LAB — BASIC METABOLIC PANEL WITH GFR
Anion gap: 11 (ref 5–15)
BUN: 11 mg/dL (ref 6–20)
CO2: 25 mmol/L (ref 22–32)
Calcium: 9.1 mg/dL (ref 8.9–10.3)
Chloride: 99 mmol/L (ref 98–111)
Creatinine, Ser: 0.64 mg/dL (ref 0.61–1.24)
GFR, Estimated: >60 mL/min (ref >60)
Glucose, Bld: 127 mg/dL — ABNORMAL HIGH (ref 70–99)
Potassium: 3.6 mmol/L (ref 3.5–5.1)
Sodium: 135 mmol/L (ref 135–145)

## 2023-12-06 LAB — GLUCOSE, CAPILLARY
Glucose-Capillary: 133 mg/dL — ABNORMAL HIGH (ref 70–99)
Glucose-Capillary: 87 mg/dL (ref 70–99)

## 2023-12-06 MED ORDER — ENSURE PLUS HIGH PROTEIN PO LIQD
237.0000 mL | Freq: Two times a day (BID) | ORAL | Status: DC
  Administered 2023-12-06: 237 mL via ORAL

## 2023-12-06 NOTE — Plan of Care (Signed)
  Problem: Education: Goal: Knowledge of General Education information will improve Description: Including pain rating scale, medication(s)/side effects and non-pharmacologic comfort measures Outcome: Progressing   Problem: Health Behavior/Discharge Planning: Goal: Ability to manage health-related needs will improve Outcome: Progressing   Problem: Clinical Measurements: Goal: Ability to maintain clinical measurements within normal limits will improve Outcome: Progressing Goal: Will remain free from infection Outcome: Progressing Goal: Diagnostic test results will improve Outcome: Progressing Goal: Respiratory complications will improve Outcome: Progressing   Problem: Activity: Goal: Risk for activity intolerance will decrease Outcome: Progressing   Problem: Nutrition: Goal: Adequate nutrition will be maintained Outcome: Progressing   Problem: Coping: Goal: Level of anxiety will decrease Outcome: Progressing   

## 2023-12-06 NOTE — Progress Notes (Signed)
 Occupational Therapy Treatment Patient Details Name: [facility] MRN: [id number] DOB: 26-Jul-1970 Today's Date: 12/06/2023   History of present illness Patient is a 53 y.o. male presented with left facial droop-upon further evaluation with neuroimaging including MRI brain-patient was found to have findings consistent with traumatic brain injury including multifocal bilateral SAH, bilateral SDH, cerebral contusions with edema/enhancements in the left temporal lobe. Past history of EtOH use-numerous falls recently (per spouse-ongoing for the past several months)   OT comments  Pt seen in conjunction with PT to maximize pts activity tolerance and optimize pt participation. Pt continues to present with cognitive deficits impacting safety with ADLs. Pt currently requires supervision for bed mobility, and supervision for functional ambulation with RW greater than a household distance. Focused session on alternating attention while ambulating in hallway to facilitate safety with IADL/ ADL tasks. Pt had difficulty alternating attention between cognitive task and walking as pt noted to stop in hallway to think.  CIR signed off on pt d/t inadequate family support at DC, therefore update DC recs to home with 24/7 support as ex wife confirms working on setting up caregiver support. Fear pt is too high level for SNF at this time. Will continue to follow acutely and update DC recs as needed.        If plan is discharge home, recommend the following:  A little help with walking and/or transfers;A little help with bathing/dressing/bathroom;Assistance with cooking/housework;Direct supervision/assist for financial management;Direct supervision/assist for medications management;Assist for transportation;Help with stairs or ramp for entrance;Supervision due to cognitive status   Equipment Recommendations  Other (comment) (RW)    Recommendations for Other Services      Precautions / Restrictions  Precautions Precautions: Fall Recall of Precautions/Restrictions: Impaired Precaution/Restrictions Comments: watch HR Restrictions Weight Bearing Restrictions Per Provider Order: No       Mobility Bed Mobility Overal bed mobility: Needs Assistance Bed Mobility: Supine to Sit, Sit to Supine     Supine to sit: Supervision Sit to supine: Supervision   General bed mobility comments: supervision for safety with line mgmt    Transfers Overall transfer level: Needs assistance Equipment used: Rolling walker (2 wheels) Transfers: Sit to/from Stand Sit to Stand: Supervision           General transfer comment: supervision for safety with line mgmt     Balance Overall balance assessment: Needs assistance Sitting-balance support: Feet supported, No upper extremity supported Sitting balance-Leahy Scale: Fair     Standing balance support: Single extremity supported, During functional activity Standing balance-Leahy Scale: Fair                             ADL either performed or assessed with clinical judgement   ADL Overall ADL's : Needs assistance/impaired                 Upper Body Dressing : Contact guard assist;Sitting Upper Body Dressing Details (indicate cue type and reason): to don back side gown     Toilet Transfer: Supervision/safety;Ambulation;Rolling walker (2 wheels) Toilet Transfer Details (indicate cue type and reason): simulated via functional mobility         Functional mobility during ADLs: Supervision/safety;Rolling walker (2 wheels) General ADL Comments: ADL participation impacted by cognition and impaired attention and awareness    Extremity/Trunk Assessment Upper Extremity Assessment Upper Extremity Assessment: Generalized weakness;Overall [facility] for tasks assessed   Lower Extremity Assessment Lower Extremity Assessment: Defer to PT evaluation   Cervical /  Trunk Assessment Cervical / Trunk Assessment: Normal    Vision  Baseline Vision/History: 1 Wears glasses Ability to See in Adequate Light: 0 Adequate Patient Visual Report: No change from baseline Additional Comments: no visual deficits noted during functional mobility, did not formally assess   Perception Perception Perception: Not tested   Praxis Praxis Praxis: Not tested   Communication Communication Communication: No apparent difficulties   Cognition Arousal: Alert Behavior During Therapy: WFL for tasks assessed/performed (brief moment of irritability but overall appropriate) Cognition: Cognition impaired     Awareness: Intellectual awareness impaired, Online awareness impaired Memory impairment (select all impairments): Declarative long-term memory Attention impairment (select first level of impairment): Selective attention Executive functioning impairment (select all impairments): Organization, Sequencing, Reasoning, Problem solving OT - Cognition Comments: pt following all commands during session, pt with moment of irritability but overall appropriate. focused on challenging attention for safety with ADLs with pt instructed to sequence through letters of alphabet to state correalting animal names. pt did have difficulties multitasking noted to stop to think.               [place] Scales of Cognitive Functioning: Confused, Appropriate: Moderate Assistance [VI] Following commands: Intact Following commands impaired: Only follows one step commands consistently, Follows multi-step commands with increased time      Cueing   Cueing Techniques: Verbal cues  Exercises      Shoulder Instructions       General Comments VSS on RA    Pertinent Vitals/ Pain       Pain Assessment Pain Assessment: No/denies pain  Home Living                                          Prior Functioning/Environment              Frequency  Min 2X/week        Progress Toward Goals  OT Goals(current goals can now  be found in the care plan section)  Progress towards OT goals: Progressing toward goals  Acute Rehab OT Goals 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 OT Goal Formulation: With patient Time For Goal Achievement: 12/11/23 Potential to Achieve Goals: Good  Plan      Co-evaluation    PT/OT/SLP Co-Evaluation/Treatment: Yes Reason for Co-Treatment: Necessary to address cognition/behavior during functional activity;To address functional/ADL transfers   OT goals addressed during session: ADL's and self-care      AM-PAC OT 6 Clicks Daily Activity     Outcome Measure   Help from another person eating meals?: None Help from another person taking care of personal grooming?: A Little Help from another person toileting, which includes using toliet, bedpan, or urinal?: A Little Help from another person bathing (including washing, rinsing, drying)?: A Little Help from another person to put on and taking off regular upper body clothing?: None Help from another person to put on and taking off regular lower body clothing?: A Little 6 Click Score: 20    End of Session Equipment Utilized During Treatment: Gait belt;Rolling walker (2 wheels)  OT Visit Diagnosis: Unsteadiness on feet (R26.81);Repeated falls (R29.6);History of falling (Z91.81);Ataxia, unspecified (R27.0);Other symptoms and signs involving cognitive function   Activity Tolerance Patient tolerated treatment well   Patient Left in bed;with call bell/phone within reach;with bed alarm set   Nurse Communication Mobility status (nurse observed session in hallway)  Time: 8899-8876 OT Time Calculation (min): 23 min  Charges: OT General Charges $OT Visit: 1 Visit OT Treatments $Therapeutic Activity: 8-22 mins  Ronal Mallie POUR., COTA/L Acute Rehabilitation Services [phone]   Ronal Mallie Needy 12/06/2023, 12:13 PM

## 2023-12-06 NOTE — Progress Notes (Signed)
 Physical Therapy Treatment Patient Details Name: Christian King MRN: [id number] DOB: 1970-10-22 Today's Date: 12/06/2023   History of Present Illness Patient is a 53 y.o. male presented with left facial droop-upon further evaluation with neuroimaging including MRI brain-patient was found to have findings consistent with traumatic brain injury including multifocal bilateral SAH, bilateral SDH, cerebral contusions with edema/enhancements in the left temporal lobe. Past history of EtOH use-numerous falls recently (per spouse-ongoing for the past several months)    PT Comments  Pt tolerated treatment well today. Co-treat with OT. Pt able to perform dual tasking exercise with OT while ambulating. Pt noted to stop several to try to answer OT's questions and had to be given cues to stay on task. Supervision overall for ambulation. DC recs updated to HHPT if pt can get 24 hour supervision which spouse in room states that she is working on. CIR signed off on patient due to lack of caregiver support however pt is now too high level for SNF. PT will continue to follow.     If plan is discharge home, recommend the following: A lot of help with walking and/or transfers;A lot of help with bathing/dressing/bathroom;Supervision due to cognitive status;Help with stairs or ramp for entrance;Assist for transportation;Direct supervision/assist for medications management;Direct supervision/assist for financial management;Assistance with cooking/housework   Can travel by private vehicle        Equipment Recommendations  Rolling walker (2 wheels)    Recommendations for Other Services       Precautions / Restrictions Precautions Precautions: Fall Recall of Precautions/Restrictions: Impaired Precaution/Restrictions Comments: watch HR Restrictions Weight Bearing Restrictions Per Provider Order: No     Mobility  Bed Mobility Overal bed mobility: Needs Assistance Bed Mobility: Supine to Sit, Sit to  Supine     Supine to sit: Supervision Sit to supine: Supervision   General bed mobility comments: supervision for safety with line mgmt    Transfers Overall transfer level: Needs assistance Equipment used: Rolling walker (2 wheels) Transfers: Sit to/from Stand Sit to Stand: Supervision           General transfer comment: supervision for safety with line mgmt    Ambulation/Gait Ambulation/Gait assistance: Supervision, +2 safety/equipment Gait Distance (Feet): 600 Feet Assistive device: Rolling walker (2 wheels) Gait Pattern/deviations: Step-through pattern, Decreased stride length, Ataxic, Trunk flexed, Wide base of support, Decreased dorsiflexion - right, Decreased step length - right, Decreased stance time - right Gait velocity: dec     General Gait Details: Pt able to perform dual tasking exercise with OT while ambulating. Pt noted to stop several to try to answer OT's questions and had to be given cues to stay on task. Supervision overall for ambulation.   Stairs             Wheelchair Mobility     Tilt Bed    Modified Rankin (Stroke Patients Only)       Balance Overall balance assessment: Needs assistance Sitting-balance support: Feet supported, No upper extremity supported Sitting balance-Leahy Scale: Fair     Standing balance support: Single extremity supported, During functional activity Standing balance-Leahy Scale: Fair                              Hotel manager: No apparent difficulties  Cognition Arousal: Alert Behavior During Therapy: WFL for tasks assessed/performed (brief moment of irritability but overall appropriate)  Rancho Levels of Cognitive Functioning Rancho Los Amigos Scales of Cognitive Functioning: Confused, Appropriate: Moderate Assistance [email or web address] Scales of Cognitive Functioning: Confused, Appropriate: Moderate Assistance [VI]   Following  commands: Intact Following commands impaired: Only follows one step commands consistently, Follows multi-step commands with increased time    Cueing Cueing Techniques: Verbal cues  Exercises      General Comments General comments (skin integrity, edema, etc.): VSS on RA      Pertinent Vitals/Pain Pain Assessment Pain Assessment: No/denies pain    Home Living                          Prior Function            PT Goals (current goals can now be found in the care plan section) Progress towards PT goals: Progressing toward goals    Frequency    Min 3X/week      PT Plan      Co-evaluation PT/OT/SLP Co-Evaluation/Treatment: Yes Reason for Co-Treatment: Necessary to address cognition/behavior during functional activity;To address functional/ADL transfers PT goals addressed during session: Mobility/safety with mobility;Proper use of DME OT goals addressed during session: ADL's and self-care      AM-PAC PT 6 Clicks Mobility   Outcome Measure  Help needed turning from your back to your side while in a flat bed without using bedrails?: A Little Help needed moving from lying on your back to sitting on the side of a flat bed without using bedrails?: A Little Help needed moving to and from a bed to a chair (including a wheelchair)?: A Lot Help needed standing up from a chair using your arms (e.g., wheelchair or bedside chair)?: A Lot Help needed to walk in hospital room?: Total Help needed climbing 3-5 steps with a railing? : Total 6 Click Score: 12    End of Session Equipment Utilized During Treatment: Gait belt Activity Tolerance: Patient tolerated treatment well Patient left: in bed;with call bell/phone within reach;with family/visitor present Nurse Communication: Mobility status PT Visit Diagnosis: Difficulty in walking, not elsewhere classified (R26.2)     Time: 8940-8873 PT Time Calculation (min) (ACUTE ONLY): 27 min  Charges:    $Gait Training:  8-22 mins PT General Charges $$ ACUTE PT VISIT: 1 Visit                     Sueellen NOVAK, PT, DPT Acute Rehab Services [phone]    Kerriann Kamphuis 12/06/2023, 3:58 PM

## 2023-12-06 NOTE — Progress Notes (Signed)
 PROGRESS NOTE        PATIENT DETAILS Name: Christian King Age: 53 y.o. Sex: male Date of Birth: Oct 02, 1970 Admit Date: 11/25/2023 Admitting Physician Donalda CHRISTELLA Applebaum, MD ERE:[facility], No Pcp Per  Brief Summary: Patient is a 53 y.o.  male with history of EtOH use-numerous falls recently (per spouse-ongoing for the past several months)-presented with left facial droop-upon further evaluation with neuroimaging including MRI brain-patient was found to have findings consistent with traumatic brain injury including multifocal bilateral SAH, bilateral SDH, cerebral contusions with edema/enhancements in the left temporal lobe.  Patient was admitted and managed with supportive care-Hospital course was complicated by alcohol withdrawal/somnolence/oropharyngeal dysphagia requiring Cortrak tube.  Patient was managed with phenobarb/Ativan  taper-he slowly improved and on 10/13-was much more awake and alert.  See below for further details.  Significant events: 10/6>> presented with facial droop-to [name] 10/7>> transferred to [facility] 10/8>> mechanical fall after spouse left the room-repeat CT head/maxillofacial-stable. 10/9>>Cortrak tube inserted-somnolent-accumulating secretions-NPO per SLP 10/13>>improved-much more awake/alert 10/17>> Foley removed-s/p MBS-started on regular diet-Cortrak removed.  Significant studies: 10/6>> x-ray left hand: No fracture 10/6>> x-ray pelvis/right hip: No fracture. 10/6>> CTA head/neck: No LVO/high-grade stenosis. 10/6>> CT C-spine: No acute abnormality of the C-spine 10/6>> CT maxillofacial: No fracture 10/6>> CT chest: Left 10th rib fracture. 10/7>> MRI brain:  multifocal bilateral SAH, bilateral SDH, cerebral contusions with edema/enhancements in the left temporal lobe. 10/8>> EEG: Negative for seizures. 10/8>> CT head: Stable prior SDH versus hygroma-stable SAH. 10/8>> CT maxillofacial: No facial bone fracture.  Significant  microbiology data: 10/7>> blood culture: No growth  Procedures: None  Consults: Neurosurgery Neurology  Subjective: No issues overnight-Foley/NG tube removed earlier this morning.  Remains completely awake and alert.  Objective: Vitals: Blood pressure 107/78, pulse 62, temperature 97.6 F (36.4 C), temperature source Oral, resp. rate 17, height 5' 9 (1.753 m), weight 70.5 kg, SpO2 94%.   Exam: Gen Exam:Alert awake-not in any distress HEENT:atraumatic, normocephalic Chest: B/L clear to auscultation anteriorly CVS:S1S2 regular Abdomen:soft non tender, non distended Extremities:no edema Neurology: Non focal Skin: no rash  Pertinent Labs/Radiology:    Latest Ref Rng & Units 12/04/2023    3:48 AM 12/02/2023    3:26 AM 11/30/2023    5:33 AM  CBC  WBC 4.0 - 10.5 K/uL 8.1  5.4  5.5   Hemoglobin 13.0 - 17.0 g/dL 88.2  88.2  88.2   Hematocrit 39.0 - 52.0 % 34.4  34.9  33.5   Platelets 150 - 400 K/uL 318  230  195     Lab Results  Component Value Date   NA 135 12/06/2023   K 3.6 12/06/2023   CL 99 12/06/2023   CO2 25 12/06/2023      Assessment/Plan: TBI with left temporal lobe contusion/edema/bilateral SAH/bilateral SDH Secondary to multiple falls that the patient has sustained in the past several weeks/months-suspect in the context of EtOH intoxication (confirmed by spouse-several days back-and then by father on 10/15). Appreciate neurosurgical input-no surgical indications Appreciated neurology input-initially there was some suspicion for HSV encephalitis but felt unlikely after MRI brain findings.  No need for LP per neurology. Discussed with trauma service-[name]-PA-C on 10/8-care is mostly supportive-PT/OT/SLP eval. Unfortunately-had another mechanical fall 10/8-has been counseled to let nursing staff know before he gets out of bed.  Discussed with nursing staff-bedside sitter ordered-fall precautions. Currently stable for  the past several days-and much more  awake and alert.  Oropharyngeal dysphagia Likely secondary to above-then lately due to confusion associated with ETOH withdrrawls Repeat MBS on 10/17-started on a diet-no longer with NG tube.    Left 10th rib fracture Supportive care Incentive spirometry/flutter valve  Hypokalemia Repleted  Hypophosphatemia Repleted  Hypomagnesemia Repleted.  Rhabdomyolysis Mild CK levels have now normalized with supportive care.  Acute urinary retention Foley catheter inserted on admit (per flowsheet-around 800 cc residual urine drained) Continue follow-up recs Foley catheter removed 10/17-voiding spontaneously.    GERD PPI  EtOH withdrawal ?  Hospital delirium versus acute metabolic encephalopathy MUCH more awake and alert on starting on 10/13-stable for the past several days and continues to improve. Completed phenobarbital and Ativan  taper. Should be out of EtOH withdrawal at this point. Suspicion that given his longstanding history of EtOH use (per spouse-started drinking around 10 years-father/grandfather alcoholics as well)-concerned he probably already had some amount of alcohol related cognitive issues (Korsakoff/Wernicke's) at baseline.  Spouse acknowledges that he did have cognitive issues and used to confabulate prior to this hospitalization. Completed high-dose thiamine  x 5 days on 10/12-and now on 100 mg thiamine  daily  Nutrition Status: Nutrition Problem: Moderate Malnutrition Etiology: social / environmental circumstances Signs/Symptoms: percent weight loss, moderate muscle depletion, mild fat depletion Percent weight loss: 13.3 % Interventions: Ensure Enlive (each supplement provides 350kcal and 20 grams of protein), MVI, Refer to RD note for recommendations, Calorie Count    Code status:   Code Status: Full Code   DVT Prophylaxis: SCDs Start: 11/26/23 9661   Family Communication:  Father at bedside on 10/15. Spouse-Ravonda Cy Fallow [phone] updated  10/14   Disposition Plan: Status is: Inpatient Remains inpatient appropriate because: Severity of illness   Planned Discharge Destination:Home health versus rehab facility   Diet: Diet Order             Diet regular Room service appropriate? Yes; Fluid consistency: Thin  Diet effective now                     Antimicrobial agents: Anti-infectives (From admission, onward)    Start     Dose/Rate Route Frequency Ordered Stop   11/26/23 0000  acyclovir (ZOVIRAX) 700 mg in dextrose  5 % 100 mL IVPB  Status:  Discontinued        700 mg 114 mL/hr over 60 Minutes Intravenous Every 8 hours 11/25/23 2334 11/27/23 0346        MEDICATIONS: Scheduled Meds:  Chlorhexidine  Gluconate Cloth  6 each Topical Daily   feeding supplement  237 mL Oral BID BM   folic acid   1 mg Per Tube Daily   lidocaine   1 patch Transdermal Q24H   melatonin  10 mg Per Tube QHS   multivitamin with minerals  1 tablet Per Tube Daily   pantoprazole  (PROTONIX ) IV  40 mg Intravenous Q12H   QUEtiapine  50 mg Per Tube QHS   scopolamine  1 patch Transdermal Q72H   tamsulosin  0.4 mg Oral Daily   thiamine  (VITAMIN B1) injection  100 mg Intravenous Daily   Continuous Infusions:   PRN Meds:.acetaminophen  **OR** acetaminophen , alum & mag hydroxide-simeth, dextrose , HYDROmorphone  (DILAUDID ) injection, ipratropium-albuterol , melatonin, ondansetron  **OR** ondansetron  (ZOFRAN ) IV, phenol, polyethylene glycol   I have personally reviewed following labs and imaging studies  LABORATORY DATA: CBC: Recent Labs  Lab 11/30/23 0533 12/02/23 0326 12/04/23 0348  WBC 5.5 5.4 8.1  HGB 11.7* 11.7* 11.7*  HCT 33.5*  34.9* 34.4*  MCV 100.9* 102.9* 102.4*  PLT 195 230 318    Basic Metabolic Panel: Recent Labs  Lab 11/30/23 0533 12/01/23 0516 12/02/23 0326 12/04/23 0348 12/05/23 0256 12/06/23 0328  NA 133* 133* 134* 135 133* 135  K 3.2* 3.5 4.0 3.9 3.7 3.6  CL 95* 99 99 96* 96* 99  CO2 26 25 23 28 26 25    GLUCOSE 120* 150* 118* 149* 156* 127*  BUN <5* 5* 9 9 11 11   CREATININE 0.57* 0.59* 0.61 0.66 0.59* 0.64  CALCIUM  9.0 9.0 9.0 9.3 8.9 9.1  MG 1.4* 1.5* 1.7 1.6* 2.1  --   PHOS 3.0  --   --  4.3  --   --     GFR: Estimated Creatinine Clearance: 104 mL/min (by C-G formula based on SCr of 0.64 mg/dL).  Liver Function Tests: No results for input(s): AST, ALT, ALKPHOS, BILITOT, PROT, ALBUMIN in the last 168 hours.  No results for input(s): LIPASE, AMYLASE in the last 168 hours. Recent Labs  Lab 11/30/23 0533  AMMONIA 38*    Coagulation Profile: No results for input(s): INR, PROTIME in the last 168 hours.   Cardiac Enzymes: No results for input(s): CKTOTAL, CKMB, CKMBINDEX, TROPONINI in the last 168 hours.   BNP (last 3 results) No results for input(s): PROBNP in the last 8760 hours.  Lipid Profile: No results for input(s): CHOL, HDL, LDLCALC, TRIG, CHOLHDL, LDLDIRECT in the last 72 hours.  Thyroid  Function Tests: No results for input(s): TSH, T4TOTAL, FREET4, T3FREE, THYROIDAB in the last 72 hours.  Anemia Panel: No results for input(s): VITAMINB12, FOLATE, FERRITIN, TIBC, IRON, RETICCTPCT in the last 72 hours.  Urine analysis:    Component Value Date/Time   COLORURINE STRAW (A) 05/22/2020 0044   APPEARANCEUR CLEAR 05/22/2020 0044   LABSPEC 1.009 05/22/2020 0044   PHURINE 6.0 05/22/2020 0044   GLUCOSEU NEGATIVE 05/22/2020 0044   HGBUR SMALL (A) 05/22/2020 0044   BILIRUBINUR NEGATIVE 05/22/2020 0044   KETONESUR NEGATIVE 05/22/2020 0044   PROTEINUR NEGATIVE 05/22/2020 0044   NITRITE NEGATIVE 05/22/2020 0044   LEUKOCYTESUR NEGATIVE 05/22/2020 0044    Sepsis Labs: Lactic Acid, Venous    Component Value Date/Time   LATICACIDVEN 2.2 (HH) 05/23/2020 1216    MICROBIOLOGY: No results found for this or any previous visit (from the past 240 hours).   RADIOLOGY STUDIES/RESULTS: DG Swallowing  Func-Speech Pathology Result Date: 12/06/2023 Table formatting from the original result was not included. Modified Barium Swallow Study Patient Details Name: Christian King MRN: [id number] Date of Birth: Jan 13, 1971 Today's Date: 12/06/2023 HPI/PMH: HPI: 53yo male admitted 11/25/23 after a fall, left facial droop, bilateral SAH and SDH. PMH: severe alcohol use disorder, HLD, depression, GERD, HTN, hypothyroidism, insomnia, hip replacements Clinical Impression: Pt exhibits significant improvements related to oral cohesion, pharyngeal clearance, and airway protection. Cortrak was removed earlier this date and he now exhibits functional oropharyngeal swallowing with only transient penetration of thin liquids (PAS 2, considered WFL). He achieves consistent epiglottic inversion with complete laryngeal closure, preventing further penetration/aspiration with subsequent trials of liquids and solids. Administered the 13 mm barium tablet with thin liquids, resulting in brief retention in the pyriform sinuses before clearing from the pharynx and esophagus completely after a subswallow. Recommend regular solids and thin liquids with full supervision to cue him to follow large bites/pills with liquid. SLP will continue following. Factors that may increase risk of adverse event in presence of aspiration Noe & Lianne 2021): Factors that may increase risk  of adverse event in presence of aspiration Noe & Lianne 2021): Reduced cognitive function Recommendations/Plan: Swallowing Evaluation Recommendations Swallowing Evaluation Recommendations Recommendations: PO diet PO Diet Recommendation: Regular; Thin liquids (Level 0) Liquid Administration via: Cup; Straw Medication Administration: Whole meds with liquid Supervision: Patient able to self-feed; Full supervision/cueing for swallowing strategies Swallowing strategies  : Minimize environmental distractions; Slow rate; Small bites/sips; Follow solids with liquids Postural  changes: Position pt fully upright for meals Oral care recommendations: Oral care BID (2x/day) Treatment Plan Treatment Plan Treatment recommendations: Therapy as outlined in treatment plan below Follow-up recommendations: Acute inpatient rehab (3 hours/day) Functional status assessment: Patient has had a recent decline in their functional status and demonstrates the ability to make significant improvements in function in a reasonable and predictable amount of time. Treatment frequency: Min 2x/week Treatment duration: 2 weeks Interventions: Patient/family education; Diet toleration management by SLP Recommendations Recommendations for follow up therapy are one component of a multi-disciplinary discharge planning process, led by the attending physician.  Recommendations may be updated based on patient status, additional functional criteria and insurance authorization. Assessment: Orofacial Exam: Orofacial Exam Oral Cavity: Oral Hygiene: Lingual coating Oral Cavity - Dentition: Adequate natural dentition Orofacial Anatomy: WFL Oral Motor/Sensory Function: WFL Anatomy: Anatomy: Suspected cervical osteophytes Boluses Administered: Boluses Administered Boluses Administered: Thin liquids (Level 0); Mildly thick liquids (Level 2, nectar thick); Moderately thick liquids (Level 3, honey thick); Puree; Solid  Oral Impairment Domain: Oral Impairment Domain Lip Closure: No labial escape Tongue control during bolus hold: Cohesive bolus between tongue to palatal seal Bolus preparation/mastication: Timely and efficient chewing and mashing Bolus transport/lingual motion: Brisk tongue motion Oral residue: Complete oral clearance Location of oral residue : N/A Initiation of pharyngeal swallow : Posterior angle of the ramus  Pharyngeal Impairment Domain: Pharyngeal Impairment Domain Soft palate elevation: No bolus between soft palate (SP)/pharyngeal wall (PW) Laryngeal elevation: Complete superior movement of thyroid  cartilage with  complete approximation of arytenoids to epiglottic petiole Anterior hyoid excursion: Partial anterior movement Epiglottic movement: Complete inversion Laryngeal vestibule closure: Incomplete, narrow column air/contrast in laryngeal vestibule Pharyngeal stripping wave : Present - complete Pharyngeal contraction (A/P view only): N/A Pharyngoesophageal segment opening: Partial distention/partial duration, partial obstruction of flow Tongue base retraction: No contrast between tongue base and posterior pharyngeal wall (PPW) Pharyngeal residue: Trace residue within or on pharyngeal structures Location of pharyngeal residue: Valleculae; Pyriform sinuses  Esophageal Impairment Domain: Esophageal Impairment Domain Esophageal clearance upright position: Complete clearance, esophageal coating Pill: Pill Consistency administered: Thin liquids (Level 0) Thin liquids (Level 0): [facility] Penetration/Aspiration Scale Score: Penetration/Aspiration Scale Score 1.  Material does not enter airway: Mildly thick liquids (Level 2, nectar thick); Moderately thick liquids (Level 3, honey thick); Puree; Solid; Pill 2.  Material enters airway, remains ABOVE vocal cords then ejected out: Thin liquids (Level 0) Compensatory Strategies: Compensatory Strategies Compensatory strategies: No   General Information: Caregiver present: No  Diet Prior to this Study: NPO; Cortrak/Small bore NG tube   Temperature : Normal   Respiratory Status: WFL   Supplemental O2: None (Room air)   History of Recent Intubation: No  Behavior/Cognition: Alert; Cooperative; Pleasant mood; Requires cueing Self-Feeding Abilities: Able to self-feed Baseline vocal quality/speech: Normal Volitional Cough: Able to elicit Volitional Swallow: Able to elicit Exam Limitations: No limitations Goal Planning: Prognosis for improved oropharyngeal function: Good Barriers to Reach Goals: Cognitive deficits No data recorded Patient/Family Stated Goal: wants to go home Consulted and agree with  results and recommendations: Patient; Family member/caregiver; Physician Pain: Pain  Assessment Pain Assessment: No/denies pain End of Session: Start Time:SLP Start Time (ACUTE ONLY): 0940 Stop Time: SLP Stop Time (ACUTE ONLY): 1000 Time Calculation:SLP Time Calculation (min) (ACUTE ONLY): 20 min Charges: SLP Evaluations $ SLP Speech Visit: 1 Visit SLP Evaluations $MBS Swallow: 1 Procedure $Swallowing Treatment: 1 Procedure $Speech Treatment for Individual: 1 Procedure SLP visit diagnosis: SLP Visit Diagnosis: Dysphagia, oropharyngeal phase (R13.12) Past Medical History: Past Medical History: Diagnosis Date  Alcohol abuse 07/24/2010  B12 deficiency   Blood dyscrasia   hemachromatosis  BMI 25.0-25.9,adult 01/28/2015  Bronchitis   Degenerative joint disease (DJD) of hip   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  Depression   Essential hypertension 04/26/2010  Qualifier: Diagnosis of   By: Gwenn Grimes        GERD (gastroesophageal reflux disease)   Gout   Gout 04/26/2010  Qualifier: Diagnosis of   By: Gwenn Grimes        Hemochromatosis 10/03/2013  History of DVT (deep vein thrombosis) 05/15/2016  History of kidney stones   HLD (hyperlipidemia)   HTN (hypertension)   Hyperlipidemia 04/26/2010  Qualifier: Diagnosis of   By: Gwenn Grimes        Hypokalemia 03/17/2016  Hypothyroidism   Insomnia 02/05/2014  QT prolongation 03/20/2015  Severe alcohol use disorder (HCC) 07/21/2023  Ventricular tachycardia (HCC)   in setting of hypokalemia/ hypomagnesemia and ETOH  Vitamin B12 deficiency 04/26/2010  Qualifier: Diagnosis of   By: Gwenn Grimes        Wears glasses  Past Surgical History: Past Surgical History: Procedure Laterality Date  TOTAL HIP ARTHROPLASTY Right   TOTAL HIP ARTHROPLASTY Left 03/16/2016  Procedure: TOTAL HIP ARTHROPLASTY;  Surgeon: Shari Sieving, MD;  Location: [facility] OR;  Service: Orthopedics;  Laterality: Left;  vascularized fibular graft    right hip for avascular necrosis Damien Blumenthal, M.A., CCC-SLP Speech Language Pathology, Acute Rehabilitation Services Secure Chat preferred [phone] 12/06/2023, 10:21 AM     LOS: 10 days   Donalda Applebaum, MD  [facility]    To contact the attending provider between 7A-7P or the covering provider during after hours 7P-7A, please log into the web site [email or web address] and access using universal [place] password for that web site. If you do not have the password, please call the hospital operator.  12/06/2023, 11:28 AM

## 2023-12-06 NOTE — Progress Notes (Signed)
 Modified Barium Swallow Study  Patient Details  Name: Christian King MRN: [id number] Date of Birth: Dec 29, 1970  Today's Date: 12/06/2023  Modified Barium Swallow completed.  Full report located under Chart Review in the Imaging Section.  History of Present Illness 53yo male admitted 11/25/23 after a fall, left facial droop, bilateral SAH and SDH. PMH: severe alcohol use disorder, HLD, depression, GERD, HTN, hypothyroidism, insomnia, hip replacements   Clinical Impression Pt exhibits significant improvements related to oral cohesion, pharyngeal clearance, and airway protection. Cortrak was removed earlier this date and he now exhibits functional oropharyngeal swallowing with only transient penetration of thin liquids (PAS 2, considered WFL). He achieves consistent epiglottic inversion with complete laryngeal closure, preventing further penetration/aspiration with subsequent trials of liquids and solids. Administered the 13 mm barium tablet with thin liquids, resulting in brief retention in the pyriform sinuses before clearing from the pharynx and esophagus completely after a subswallow. Recommend regular solids and thin liquids with full supervision to cue him to follow large bites/pills with liquid. SLP will continue following. Factors that may increase risk of adverse event in presence of aspiration Noe & Lianne 2021): Reduced cognitive function  Swallow Evaluation Recommendations Recommendations: PO diet PO Diet Recommendation: Regular;Thin liquids (Level 0) Liquid Administration via: Cup;Straw Medication Administration: Whole meds with liquid Supervision: Patient able to self-feed;Full supervision/cueing for swallowing strategies Swallowing strategies  : Minimize environmental distractions;Slow rate;Small bites/sips;Follow solids with liquids Postural changes: Position pt fully upright for meals Oral care recommendations: Oral care BID (2x/day)    Damien Blumenthal, M.A.,  CCC-SLP Speech Language Pathology, Acute Rehabilitation Services  Secure Chat preferred [phone]  12/06/2023,10:20 AM

## 2023-12-06 NOTE — Progress Notes (Signed)
 Nutrition Brief Note  Patient passed modified barium swallow today. Advanced to regular diet. Cortrak will remain out. Will monitor meal intake and add ONS to augment calorie/protein consumed.  Now much more alert and awake. Has not recollection of his time in withdrawal.   INTERVENTION:  Discontinue short-term nutrition support until mentation improves Add Ensure Plus High Protein po BID, each supplement provides 350 kcal and 20 grams of protein  Monitor diet advancement/tolerance  Continue thiamine /MVI/folic acid    NUTRITION DIAGNOSIS:  Moderate Malnutrition related to social / environmental circumstances as evidenced by percent weight loss, moderate muscle depletion, mild fat depletion. -remains applicable   GOAL:  Patient will meet greater than or equal to 90% of their needs - meeting via TF   MONITOR:  PO intake, Supplement acceptance, Diet advancement, Labs, Weight trends  Blair Deaner MS, RD, LDN Registered Dietitian Clinical Nutrition RD Inpatient Contact Info in Amion

## 2023-12-06 NOTE — TOC Progression Note (Signed)
 Transition of Care [facility]) - Progression Note    Patient Details  Name: Christian King MRN: [id number] Date of Birth: 17-Oct-1970  [facility]) CM/SW Contact  Inocente GORMAN Kindle, LCSW Phone Number: 12/06/2023, 6:52 PM  Clinical Narrative:    CSW met with patient regarding discharge plan and SNF recommendation. Patient stated he is feeling better and does not want to go to SNF. He would like to work with therapy again and discharge home. He stated he has done outpatient therapy before and can do it again and has home equipment. He has a walker at home. He stated his wife was at bedside. CSW updated MD.    Expected Discharge Plan: Home w Home Health Services Barriers to Discharge: Continued Medical Work up               Expected Discharge Plan and Services In-house Referral: Clinical Social Work, Conservator, museum/gallery Services: CM Consult Post Acute Care Choice: Home Health Living arrangements for the past 2 months: Single Family Home                 DME Arranged: N/A DME Agency: NA       HH Arranged: NA HH Agency: NA         Social Drivers of Health (SDOH) Interventions SDOH Screenings   Food Insecurity: No Food Insecurity (11/27/2023)  Housing: Low Risk  (11/27/2023)  Transportation Needs: No Transportation Needs (11/27/2023)  Utilities: Not At Risk (11/27/2023)  Alcohol Screen: High Risk (11/10/2023)  Depression (PHQ2-9): Low Risk  (11/27/2022)  Financial Resource Strain: Low Risk  (11/27/2022)  Physical Activity: Insufficiently Active (11/27/2022)  Social Connections: Unknown (07/21/2023)  Stress: No Stress Concern Present (11/27/2022)  Tobacco Use: High Risk (11/25/2023)    Readmission Risk Interventions    12/06/2023    6:51 PM  Readmission Risk Prevention Plan  Transportation Screening Complete  Medication Review (RN Care Manager) Complete  PCP or Specialist appointment within 3-5 days of discharge Complete  HRI or Home Care  Consult Complete  SW Recovery Care/Counseling Consult Complete  Palliative Care Screening Not Applicable  Skilled Nursing Facility Patient Refused

## 2023-12-06 NOTE — NC FL2 (Signed)
 [place]  MEDICAID FL2 LEVEL OF CARE FORM     IDENTIFICATION  Patient Name: Christian King Birthdate: 1970/10/17 Sex: male Admission Date (Current Location): 11/25/2023  [facility] and [place] Number:  [employer] and Address:  [facility] [place], [address] [place], [place] [address]      Provider Number: [id number]  Attending Physician Name and Address:  Raenelle Donalda HERO, MD  Relative Name and Phone Number:       Current Level of Care: Hospital Recommended Level of Care: Skilled Nursing Facility Prior Approval Number:    Date Approved/Denied:   PASRR Number: [id number]  Discharge Plan: SNF    Current Diagnoses: Patient Active Problem List   Diagnosis Date Noted   Malnutrition of moderate degree 11/29/2023   Alcohol withdrawal (HCC) 11/26/2023   Falls, initial encounter 11/26/2023   Subarachnoid hemorrhage (HCC) 11/26/2023   Subdural hematoma (HCC) 11/26/2023   Hypomagnesemia 11/26/2023   Elevated CK 11/26/2023   Elevated troponin 11/26/2023   Substance induced mood disorder (HCC) 07/21/2023   Severe alcohol use disorder (HCC) 07/21/2023   Chronic hypokalemia 07/21/2023   Hyperlipidemia 07/21/2023   Hypokalemia 03/17/2016   Cerebral edema (HCC) 03/20/2015    Orientation RESPIRATION BLADDER Height & Weight     Self, Time, Situation, Place (some confusion)  Normal Incontinent, External catheter Weight: 155 lb 6.8 oz (70.5 kg) Height:  5' 9 (175.3 cm)  BEHAVIORAL SYMPTOMS/MOOD NEUROLOGICAL BOWEL NUTRITION STATUS      Incontinent Diet (See dc summary)  AMBULATORY STATUS COMMUNICATION OF NEEDS Skin   Limited Assist Verbally Normal                       Personal Care Assistance Level of Assistance  Bathing, Feeding, Dressing Bathing Assistance: Limited assistance Feeding assistance: Independent Dressing Assistance: Independent     Functional Limitations Info  Sight Sight Info: Impaired        SPECIAL CARE  FACTORS FREQUENCY  PT (By licensed PT), OT (By licensed OT)     PT Frequency: 5x/week OT Frequency: 5x/week            Contractures Contractures Info: Not present    Additional Factors Info  Code Status, Allergies Code Status Info: Full Allergies Info: Amoxicillin , Azithromycin , Hctz (Hydrochlorothiazide), Tramadol           Current Medications (12/06/2023):  This is the current hospital active medication list Current Facility-Administered Medications  Medication Dose Route Frequency Provider Last Rate Last Admin   acetaminophen  (TYLENOL ) tablet 650 mg  650 mg Per Tube Q6H PRN Raenelle Donalda HERO, MD   650 mg at 12/06/23 0429   Or   acetaminophen  (TYLENOL ) suppository 650 mg  650 mg Rectal Q6H PRN Raenelle Donalda HERO, MD       alum & mag hydroxide-simeth (MAALOX/MYLANTA) 200-200-20 MG/5ML suspension 30 mL  30 mL Per Tube Q6H PRN Raenelle Donalda HERO, MD   30 mL at 12/03/23 0843   Chlorhexidine  Gluconate Cloth 2 % PADS 6 each  6 each Topical Daily Raenelle Donalda HERO, MD   6 each at 12/05/23 0932   dextrose  50 % solution 50 mL  1 ampule Intravenous PRN Raenelle Donalda HERO, MD       feeding supplement (OSMOLITE 1.5 CAL) liquid 1,000 mL  1,000 mL Per Tube Continuous Raenelle Donalda HERO, MD 55 mL/hr at 12/04/23 1547 1,000 mL at 12/04/23 1547   feeding supplement (PROSource TF20) liquid 60 mL  60 mL  Per Tube Daily Raenelle Donalda HERO, MD   60 mL at 12/06/23 9372   folic acid  (FOLVITE ) tablet 1 mg  1 mg Per Tube Daily Raenelle Donalda HERO, MD   1 mg at 12/06/23 9372   free water 150 mL  150 mL Per Tube Q4H Raenelle Donalda HERO, MD   150 mL at 12/06/23 0429   HYDROmorphone  (DILAUDID ) injection 0.5 mg  0.5 mg Intravenous Q8H PRN Ricky Fines, MD   0.5 mg at 12/03/23 1627   ipratropium-albuterol  (DUONEB) 0.5-2.5 (3) MG/3ML nebulizer solution 3 mL  3 mL Nebulization Q4H PRN Raenelle Donalda HERO, MD       lidocaine  (LIDODERM ) 5 % 1 patch  1 patch Transdermal Q24H Yolande Lamar BROCKS, MD   1 patch at  12/05/23 2107   melatonin tablet 10 mg  10 mg Per Tube QHS Raenelle Donalda HERO, MD   10 mg at 12/05/23 2108   melatonin tablet 5 mg  5 mg Oral QHS PRN Shona Terry SAILOR, DO       multivitamin with minerals tablet 1 tablet  1 tablet Per Tube Daily Ghimire, Shanker M, MD   1 tablet at 12/06/23 9372   ondansetron  (ZOFRAN ) tablet 4 mg  4 mg Oral Q6H PRN Adefeso, Oladapo, DO       Or   ondansetron  (ZOFRAN ) injection 4 mg  4 mg Intravenous Q6H PRN Adefeso, Oladapo, DO   4 mg at 12/03/23 9247   pantoprazole  (PROTONIX ) injection 40 mg  40 mg Intravenous Q12H Raenelle Donalda HERO, MD   40 mg at 12/05/23 2108   phenol (CHLORASEPTIC) mouth spray 1 spray  1 spray Mouth/Throat PRN Segars, Jonathan, MD   1 spray at 12/03/23 0320   polyethylene glycol (MIRALAX  / GLYCOLAX ) packet 17 g  17 g Oral Daily PRN Shona Terry N, DO       QUEtiapine (SEROQUEL) tablet 50 mg  50 mg Per Tube QHS Ghimire, Shanker M, MD   50 mg at 12/05/23 2108   scopolamine (TRANSDERM-SCOP) 1 MG/3DAYS 1 mg  1 patch Transdermal Q72H [place], Carlos, MD   1 mg at 12/05/23 0932   tamsulosin (FLOMAX) capsule 0.4 mg  0.4 mg Oral Daily Raenelle Donalda HERO, MD   0.4 mg at 12/06/23 9372   thiamine  (VITAMIN B1) injection 100 mg  100 mg Intravenous Daily Raenelle Donalda HERO, MD   100 mg at 12/05/23 9068     Discharge Medications: Please see discharge summary for a list of discharge medications.  Relevant Imaging Results:  Relevant Lab Results:   Additional Information SSN    [id number]  Inocente GORMAN Kindle, LCSW

## 2023-12-06 NOTE — Progress Notes (Signed)
 Speech Language Pathology Treatment: Cognitive-Linguistic  Patient Details Name: Christian King MRN: [id number] DOB: 1970-12-06 Today's Date: 12/06/2023 Time: 1000-1010 SLP Time Calculation (min) (ACUTE ONLY): 10 min  Assessment / Plan / Recommendation Clinical Impression  Pt participated in tasks related to problem solving, sequencing, and planning while finding the way back to his room from MBS given Min prompting overall. Today, he is perseverative on wanting to return home but cannot identify specific safety concerns that may be inhibitive. His performance is consistent with [place] Level V (confused, inappropriate) and needs Mod cues for awareness of pragmatic deficits. Recommend intensive SLP f/u to target cognitive goals, >3 hrs/day. Will continue following acutely.    HPI HPI: 53yo male admitted 11/25/23 after a fall, left facial droop, bilateral SAH and SDH. PMH: severe alcohol use disorder, HLD, depression, GERD, HTN, hypothyroidism, insomnia, hip replacements      SLP Plan  Continue with current plan of care          Recommendations                         Frequent or constant Supervision/Assistance Dysphagia, oropharyngeal phase (R13.12)     Continue with current plan of care     Damien Blumenthal, M.A., CCC-SLP Speech Language Pathology, Acute Rehabilitation Services  Secure Chat preferred [phone]   12/06/2023, 10:23 AM

## 2023-12-07 ENCOUNTER — Other Ambulatory Visit (HOSPITAL_COMMUNITY): Payer: Self-pay

## 2023-12-07 LAB — PSA: Prostatic Specific Antigen: 1.9 ng/mL (ref 0.00–4.00)

## 2023-12-07 MED ORDER — PANTOPRAZOLE SODIUM 40 MG PO TBEC
40.0000 mg | DELAYED_RELEASE_TABLET | Freq: Every day | ORAL | 2 refills | Status: AC
Start: 2023-12-07 — End: 2024-12-06
  Filled 2023-12-07 – 2023-12-14 (×2): qty 30, 30d supply, fill #0

## 2023-12-07 MED ORDER — ADULT MULTIVITAMIN W/MINERALS CH
1.0000 | ORAL_TABLET | Freq: Every day | ORAL | 2 refills | Status: AC
  Filled 2023-12-07 – 2023-12-14 (×2): qty 30, 30d supply, fill #0

## 2023-12-07 MED ORDER — TAMSULOSIN HCL 0.4 MG PO CAPS
0.4000 mg | ORAL_CAPSULE | Freq: Every day | ORAL | 2 refills | Status: AC
  Filled 2023-12-07 – 2023-12-14 (×2): qty 30, 30d supply, fill #0

## 2023-12-07 MED ORDER — THIAMINE HCL 100 MG PO TABS: 100.0000 mg | ORAL_TABLET | Freq: Every day | ORAL | 2 refills | Status: AC

## 2023-12-07 NOTE — TOC Transition Note (Signed)
 Transition of Care [facility]) - Discharge Note   Patient Details  Name: Christian King MRN: [id number] Date of Birth: 04/18/1970  [facility]) CM/SW Contact:  Marval Gell, RN Phone Number: 12/07/2023, 9:07 AM   Clinical Narrative:     [name] w patient and wife at bedside. Discussed outpatient rehab, they would like [place] office, referral has been placed and added to AVS, they are aware to call Monday morning to schedule. The have RW and needed DME at [facility] other TOC needs identified     Barriers to Discharge: Continued Medical Work up   Patient Goals and CMS Choice Patient states their goals for this hospitalization and ongoing recovery are:: Return home CMS Medicare.gov Compare Post Acute Care list provided to:: Patient Choice offered to / list presented to : Patient [place] ownership interest in [facility].provided to:: Patient    Discharge Placement                       Discharge Plan and Services Additional resources added to the After Visit Summary for   In-house Referral: Clinical Social Work, Artist Discharge Planning Services: CM Consult Post Acute Care Choice: Home Health          DME Arranged: N/A DME Agency: NA       HH Arranged: NA HH Agency: NA        Social Drivers of Health (SDOH) Interventions SDOH Screenings   Food Insecurity: No Food Insecurity (11/27/2023)  Housing: Low Risk  (11/27/2023)  Transportation Needs: No Transportation Needs (11/27/2023)  Utilities: Not At Risk (11/27/2023)  Alcohol Screen: High Risk (11/10/2023)  Depression (PHQ2-9): Low Risk  (11/27/2022)  Financial Resource Strain: Low Risk  (11/27/2022)  Physical Activity: Insufficiently Active (11/27/2022)  Social Connections: Unknown (07/21/2023)  Stress: No Stress Concern Present (11/27/2022)  Tobacco Use: High Risk (11/25/2023)     Readmission Risk Interventions    12/06/2023    6:51 PM  Readmission Risk Prevention Plan   Transportation Screening Complete  Medication Review (RN Care Manager) Complete  PCP or Specialist appointment within 3-5 days of discharge Complete  HRI or Home Care Consult Complete  SW Recovery Care/Counseling Consult Complete  Palliative Care Screening Not Applicable  Skilled Nursing Facility Patient Refused

## 2023-12-07 NOTE — Discharge Summary (Signed)
 PATIENT DETAILS Name: Christian King Age: 53 y.o. Sex: male Date of Birth: 03-28-1970 MRN: [id number]. Admitting Physician: Donalda CHRISTELLA Applebaum, MD ERE:[place], No Pcp Per  Admit Date: 11/25/2023 Discharge date: 12/07/2023  Recommendations for Outpatient Follow-up:  Follow up with PCP in 1-2 weeks Please obtain CMP/CBC in one week Please follow-up PSA-pending at the time of discharge. Please ensure outpatient follow-up with urology.   Admitted From:  Home  Disposition: Home-outpatient physical therapy   Discharge Condition: fair  CODE STATUS:   Code Status: Full Code   Diet recommendation:  Diet Order             Diet - low sodium heart healthy           Diet regular Room service appropriate? Yes; Fluid consistency: Thin  Diet effective now                    Brief Summary: Patient is a 53 y.o.  male with history of EtOH use-numerous falls recently (per spouse-ongoing for the past several months)-presented with left facial droop-upon further evaluation with neuroimaging including MRI brain-patient was found to have findings consistent with traumatic brain injury including multifocal bilateral SAH, bilateral SDH, cerebral contusions with edema/enhancements in the left temporal lobe.  Patient was admitted and managed with supportive care-Hospital course was complicated by alcohol withdrawal/somnolence/oropharyngeal dysphagia requiring Cortrak tube.  Patient was managed with phenobarb/Ativan  taper-he slowly improved and on 10/13-was much more awake and alert.  See below for further details.   Significant events: 10/6>> presented with facial droop-to [name] 10/7>> transferred to [facility]. 10/8>> mechanical fall after spouse left the room-repeat CT head/maxillofacial-stable. 10/9>>Cortrak tube inserted-somnolent-accumulating secretions-NPO per SLP 10/13>>improved-much more awake/alert 10/17>> Foley removed-s/p MBS-started on regular diet-Cortrak removed.   Reevaluated by physical therapy-remarkably better-no longer needs to go to SNF-outpatient therapy recommended.   Significant studies: 10/6>> x-ray left hand: No fracture 10/6>> x-ray pelvis/right hip: No fracture. 10/6>> CTA head/neck: No LVO/high-grade stenosis. 10/6>> CT C-spine: No acute abnormality of the C-spine 10/6>> CT maxillofacial: No fracture 10/6>> CT chest: Left 10th rib fracture. 10/7>> MRI brain:  multifocal bilateral SAH, bilateral SDH, cerebral contusions with edema/enhancements in the left temporal lobe. 10/8>> EEG: Negative for seizures. 10/8>> CT head: Stable prior SDH versus hygroma-stable SAH. 10/8>> CT maxillofacial: No facial bone fracture.   Significant microbiology data: 10/7>> blood culture: No growth   Procedures: None   Consults: Neurosurgery Neurology  Brief Hospital Course: TBI with left temporal lobe contusion/edema/bilateral SAH/bilateral SDH Secondary to multiple falls that the patient has sustained in the past several weeks/months-suspect in the context of EtOH intoxication (confirmed by spouse-several days back-and then by father on 10/15). Evaluated by neurosurgery-no surgical indication On initial presentation-there was some suspicion for appreciate neurosurgical input-hence transferred to [facility] from [facility] neurology evaluation-however MRI brain done subsequently did not show any suggestion of HSV encephalitis, it was felt that MRI changes likely were more reflective of TBI.  Neurology did not feel the need for any lumbar puncture.  No surgical indications Discussed with trauma service-[name]-PA-C on 10/8-care is mostly supportive-PT/OT/SLP eval. He did have another mechanical fall on 10/8-as he was starting to have alcohol withdrawal and was very impulsive-he was moved closer to the nurses station-repeat imaging was stable.  He had a Recruitment consultant at bedside for several days.  Thankfully he is now much better with supportive  care-completely awake/alert-reevaluated by physical therapy-recommendations are now to discharge home (previously felt to require SNF).  He has been counseled extensively regarding importance of avoiding any further alcohol use.   Oropharyngeal dysphagia Likely secondary to above-then  due to confusion associated with ETOH withdrrawls Followed closely by SLP-did require Cortrak tube insertion and feeds via tube.  Thankfully repeat MBS on 10/17 showed significant improvement-NG tube removed-patient tolerating diet without any issues.   Left 10th rib fracture Supportive care Incentive spirometry/flutter valve   Hypokalemia Repleted   Hypophosphatemia Repleted   Hypomagnesemia Repleted.   Rhabdomyolysis Mild CK levels have now normalized with supportive care.   Acute urinary retention Foley catheter inserted on admit (per flowsheet-around 800 cc residual urine drained) Continue follow-up recs Foley catheter removed 10/17-voiding spontaneously.   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  PSA pending at the time of discharge-please ensure outpatient follow-up with urology.   GERD PPI   EtOH withdrawal ?  Hospital delirium versus acute metabolic encephalopathy As noted above-Hospital course complicated by severe alcohol withdrawal-he was on phenobarb taper and then a Ativan  taper per CIWA protocol.  Thankfully with supportive care-he has improved-and by day of discharge-was completely awake and alert. Suspicion that given his longstanding history of EtOH use (per spouse-started drinking around 10 years-father/grandfather alcoholics as well)-concerned he probably already had some amount of alcohol related cognitive issues (Korsakoff/Wernicke's) at baseline.  Spouse acknowledges that he did have cognitive issues and used to confabulate  prior to this hospitalization. Completed high-dose thiamine  x 5 days on 10/12-and now on 100 mg thiamine  daily-and as noted above-completely awake/alert and significantly improved than on initial presentation. He has been counseled extensively regarding importance of stopping further alcohol use.   HTN Apparently on metoprolol  prior to this hospitalization-his blood pressure has been very stable in the low 100s.  Hold metoprolol  on discharge-follow with PCP-and resume if necessary.  Nutrition Status: Nutrition Problem: Moderate Malnutrition Etiology: social / environmental circumstances Signs/Symptoms: percent weight loss, moderate muscle depletion, mild fat depletion Percent weight loss: 13.3 % Interventions: Ensure Enlive (each supplement provides 350kcal and 20 grams of protein), MVI, Refer to RD note for recommendations, Calorie Count   Discharge Diagnoses:  Principal Problem:   Alcohol withdrawal (HCC) Active Problems:   Cerebral edema (HCC)   Hypokalemia   Falls, initial encounter   Subarachnoid hemorrhage (HCC)   Subdural hematoma (HCC)   Hypomagnesemia   Elevated CK   Elevated troponin   Malnutrition of moderate degree   Discharge Instructions:  Activity:  As tolerated   Discharge Instructions     Ambulatory referral to Physical Medicine Rehab   Complete by: As directed    Call MD for:  difficulty breathing, headache or visual disturbances   Complete by: As directed    Call MD for:  persistant nausea and vomiting   Complete by: As directed    Diet - low sodium heart healthy   Complete by: As directed    Discharge instructions   Complete by: As directed    Follow with Primary MD in 1-2 weeks  STOP FURTHER ALCOHOL USE  Please get a complete blood count and chemistry panel checked by your Primary MD at your next visit, and again as instructed by your Primary MD.  Get Medicines reviewed and adjusted: Please take all your medications with you for your next  visit with your Primary MD  Laboratory/radiological data: Please request your Primary MD to go over  all hospital tests and procedure/radiological results at the follow up, please ask your Primary MD to get all Hospital records sent to his/her office.  In some cases, they will be blood work, cultures and biopsy results pending at the time of your discharge. Please request that your primary care M.D. follows up on these results.  Also Note the following: If you experience worsening of your admission symptoms, develop shortness of breath, life threatening emergency, suicidal or homicidal thoughts you must seek medical attention immediately by calling 911 or calling your MD immediately  if symptoms less severe.  You must read complete instructions/literature along with all the possible adverse reactions/side effects for all the Medicines you take and that have been prescribed to you. Take any new Medicines after you have completely understood and accpet all the possible adverse reactions/side effects.   Do not drive when taking Pain medications or sleeping medications (Benzodaizepines)  Do not take more than prescribed Pain, Sleep and Anxiety Medications. It is not advisable to combine anxiety,sleep and pain medications without talking with your primary care practitioner  Special Instructions: If you have smoked or chewed Tobacco  in the last 2 yrs please stop smoking, stop any regular Alcohol  and or any Recreational drug use.  Wear Seat belts while driving.  Please note: You were cared for by a hospitalist during your hospital stay. Once you are discharged, your primary care physician will handle any further medical issues. Please note that NO REFILLS for any discharge medications will be authorized once you are discharged, as it is imperative that you return to your primary care physician (or establish a relationship with a primary care physician if you do not have one) for your post hospital  discharge needs so that they can reassess your need for medications and monitor your lab values.   Increase activity slowly   Complete by: As directed       Allergies as of 12/07/2023       Reactions   Amoxicillin  Hives, Itching, Rash   Turned body completely red, severe itching all over body   Azithromycin  Other (See Comments)   History of QT prolongation   Hctz [hydrochlorothiazide] Rash   Tramadol Itching        Medication List     PAUSE taking these medications    metoprolol  succinate 50 MG 24 hr tablet Wait to take this until your doctor or other care provider tells you to start again. Commonly known as: TOPROL -XL Take 1 tablet (50 mg total) by mouth daily with or immediately following a meal.       STOP taking these medications    magnesium  oxide 400 (240 Mg) MG tablet Commonly known as: MAG-OX   potassium chloride  10 MEQ tablet Commonly known as: KLOR-CON        TAKE these medications    fenofibrate  54 MG tablet Take 54 mg by mouth daily.   multivitamin with minerals Tabs tablet Place 1 tablet into feeding tube daily.   pantoprazole  40 MG tablet Commonly known as: Protonix  Take 1 tablet (40 mg total) by mouth daily.   simvastatin  40 MG tablet Commonly known as: ZOCOR  Take 40 mg by mouth daily.   tamsulosin 0.4 MG Caps capsule Commonly known as: FLOMAX Take 1 capsule (0.4 mg total) by mouth daily.   thiamine  100 MG tablet Commonly known as: VITAMIN B1 Take 1 tablet (100 mg total) by mouth daily.        Follow-up Information  Ostwalt, Janna, PA-C Follow up.   Specialty: Physician Assistant Why: TIME : 2:20 PM     ARRIVE AT 2:00 PM DATE : OCTOBER 23 , 2025  THURSDAY  PLEASE BRING ALL CURRENT MEDICATION, ID and INS CARD Contact information: [address] [place] [address] [phone]         [facility] Follow up.   Specialty: Physical Medicine and Rehabilitation Why:  Office will call with date/time, If you dont hear from them,please give them a call, Hospital follow up Contact information: [address], [address] [place]  [address] [phone]               Allergies  Allergen Reactions   Amoxicillin  Hives, Itching and Rash    Turned body completely red, severe itching all over body   Azithromycin  Other (See Comments)    History of QT prolongation   Hctz [Hydrochlorothiazide] Rash   Tramadol Itching     Other Procedures/Studies: DG Swallowing Func-Speech Pathology Result Date: 12/06/2023 Table formatting from the original result was not included. Modified Barium Swallow Study Patient Details Name: Christian King MRN: [id number] Date of Birth: Jan 28, 1971 Today's Date: 12/06/2023 HPI/PMH: HPI: 53yo male admitted 11/25/23 after a fall, left facial droop, bilateral SAH and SDH. PMH: severe alcohol use disorder, HLD, depression, GERD, HTN, hypothyroidism, insomnia, hip replacements Clinical Impression: Pt exhibits significant improvements related to oral cohesion, pharyngeal clearance, and airway protection. Cortrak was removed earlier this date and he now exhibits functional oropharyngeal swallowing with only transient penetration of thin liquids (PAS 2, considered WFL). He achieves consistent epiglottic inversion with complete laryngeal closure, preventing further penetration/aspiration with subsequent trials of liquids and solids. Administered the 13 mm barium tablet with thin liquids, resulting in brief retention in the pyriform sinuses before clearing from the pharynx and esophagus completely after a subswallow. Recommend regular solids and thin liquids with full supervision to cue him to follow large bites/pills with liquid. SLP will continue following. Factors that may increase risk of adverse event in presence of aspiration Noe & Lianne 2021): Factors that may increase risk of adverse event in presence of aspiration  Noe & Lianne 2021): Reduced cognitive function Recommendations/Plan: Swallowing Evaluation Recommendations Swallowing Evaluation Recommendations Recommendations: PO diet PO Diet Recommendation: Regular; Thin liquids (Level 0) Liquid Administration via: Cup; Straw Medication Administration: Whole meds with liquid Supervision: Patient able to self-feed; Full supervision/cueing for swallowing strategies Swallowing strategies  : Minimize environmental distractions; Slow rate; Small bites/sips; Follow solids with liquids Postural changes: Position pt fully upright for meals Oral care recommendations: Oral care BID (2x/day) Treatment Plan Treatment Plan Treatment recommendations: Therapy as outlined in treatment plan below Follow-up recommendations: Acute inpatient rehab (3 hours/day) Functional status assessment: Patient has had a recent decline in their functional status and demonstrates the ability to make significant improvements in function in a reasonable and predictable amount of time. Treatment frequency: Min 2x/week Treatment duration: 2 weeks Interventions: Patient/family education; Diet toleration management by SLP Recommendations Recommendations for follow up therapy are one component of a multi-disciplinary discharge planning process, led by the attending physician.  Recommendations may be updated based on patient status, additional functional criteria and insurance authorization. Assessment: Orofacial Exam: Orofacial Exam Oral Cavity: Oral Hygiene: Lingual coating Oral Cavity - Dentition: Adequate natural dentition Orofacial Anatomy: WFL Oral Motor/Sensory Function: WFL Anatomy: Anatomy: Suspected cervical osteophytes Boluses Administered: Boluses Administered Boluses Administered: Thin liquids (Level 0); Mildly thick liquids (Level 2, nectar thick); Moderately thick  liquids (Level 3, honey thick); Puree; Solid  Oral Impairment Domain: Oral Impairment Domain Lip Closure: No labial escape Tongue control  during bolus hold: Cohesive bolus between tongue to palatal seal Bolus preparation/mastication: Timely and efficient chewing and mashing Bolus transport/lingual motion: Brisk tongue motion Oral residue: Complete oral clearance Location of oral residue : N/A Initiation of pharyngeal swallow : Posterior angle of the ramus  Pharyngeal Impairment Domain: Pharyngeal Impairment Domain Soft palate elevation: No bolus between soft palate (SP)/pharyngeal wall (PW) Laryngeal elevation: Complete superior movement of thyroid  cartilage with complete approximation of arytenoids to epiglottic petiole Anterior hyoid excursion: Partial anterior movement Epiglottic movement: Complete inversion Laryngeal vestibule closure: Incomplete, narrow column air/contrast in laryngeal vestibule Pharyngeal stripping wave : Present - complete Pharyngeal contraction (A/P view only): N/A Pharyngoesophageal segment opening: Partial distention/partial duration, partial obstruction of flow Tongue base retraction: No contrast between tongue base and posterior pharyngeal wall (PPW) Pharyngeal residue: Trace residue within or on pharyngeal structures Location of pharyngeal residue: Valleculae; Pyriform sinuses  Esophageal Impairment Domain: Esophageal Impairment Domain Esophageal clearance upright position: Complete clearance, esophageal coating Pill: Pill Consistency administered: Thin liquids (Level 0) Thin liquids (Level 0): [facility] Penetration/Aspiration Scale Score: Penetration/Aspiration Scale Score 1.  Material does not enter airway: Mildly thick liquids (Level 2, nectar thick); Moderately thick liquids (Level 3, honey thick); Puree; Solid; Pill 2.  Material enters airway, remains ABOVE vocal cords then ejected out: Thin liquids (Level 0) Compensatory Strategies: Compensatory Strategies Compensatory strategies: No   General Information: Caregiver present: No  Diet Prior to this Study: NPO; Cortrak/Small bore NG tube   Temperature : Normal   Respiratory  Status: WFL   Supplemental O2: None (Room air)   History of Recent Intubation: No  Behavior/Cognition: Alert; Cooperative; Pleasant mood; Requires cueing Self-Feeding Abilities: Able to self-feed Baseline vocal quality/speech: Normal Volitional Cough: Able to elicit Volitional Swallow: Able to elicit Exam Limitations: No limitations Goal Planning: Prognosis for improved oropharyngeal function: Good Barriers to Reach Goals: Cognitive deficits No data recorded Patient/Family Stated Goal: wants to go home Consulted and agree with results and recommendations: Patient; Family member/caregiver; Physician Pain: Pain Assessment Pain Assessment: No/denies pain End of Session: Start Time:SLP Start Time (ACUTE ONLY): 0940 Stop Time: SLP Stop Time (ACUTE ONLY): 1000 Time Calculation:SLP Time Calculation (min) (ACUTE ONLY): 20 min Charges: SLP Evaluations $ SLP Speech Visit: 1 Visit SLP Evaluations $MBS Swallow: 1 Procedure $Swallowing Treatment: 1 Procedure $Speech Treatment for Individual: 1 Procedure SLP visit diagnosis: SLP Visit Diagnosis: Dysphagia, oropharyngeal phase (R13.12) Past Medical History: Past Medical History: Diagnosis Date  Alcohol abuse 07/24/2010  B12 deficiency   Blood dyscrasia   hemachromatosis  BMI 25.0-25.9,adult 01/28/2015  Bronchitis   Degenerative joint disease (DJD) of hip   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  Depression   Essential hypertension 04/26/2010  Qualifier: Diagnosis of   By: Gwenn Grimes        GERD (gastroesophageal reflux disease)   Gout   Gout 04/26/2010  Qualifier: Diagnosis of   By: Gwenn Grimes        Hemochromatosis 10/03/2013  History of DVT (deep vein thrombosis) 05/15/2016  History of kidney stones   HLD (hyperlipidemia)   HTN (hypertension)   Hyperlipidemia 04/26/2010  Qualifier: Diagnosis of   By: Gwenn Grimes        Hypokalemia 03/17/2016  Hypothyroidism   Insomnia 02/05/2014  QT prolongation 03/20/2015  Severe alcohol use disorder (HCC)  07/21/2023  Ventricular tachycardia (HCC)   in setting of hypokalemia/  hypomagnesemia and ETOH  Vitamin B12 deficiency 04/26/2010  Qualifier: Diagnosis of   By: Gwenn Grimes        Wears glasses  Past Surgical History: Past Surgical History: Procedure Laterality Date  TOTAL HIP ARTHROPLASTY Right   TOTAL HIP ARTHROPLASTY Left 03/16/2016  Procedure: TOTAL HIP ARTHROPLASTY;  Surgeon: Shari Sieving, MD;  Location: [facility] OR;  Service: Orthopedics;  Laterality: Left;  vascularized fibular graft    right hip for avascular necrosis Damien Blumenthal, M.A., CCC-SLP Speech Language Pathology, Acute Rehabilitation Services Secure Chat preferred [phone] 12/06/2023, 10:21 AM  DG Swallowing Func-Speech Pathology Result Date: 12/02/2023 Table formatting from the original result was not included. Modified Barium Swallow Study Patient Details Name: JACOBO MONCRIEF MRN: [id number] Date of Birth: 11/23/1970 Today's Date: 12/02/2023 HPI/PMH: HPI: 53yo male admitted 11/25/23 after a fall, left facial droop, bilateral SAH and SDH. PMH: severe alcohol use disorder, HLD, depression, GERD, HTN, hypothyroidism, insomnia, hip replacements Clinical Impression: Clinical Impression: Pt presents with moderate oropharyngeal dysphagia. Reduced oral control/cohesion, sensation and inability to clear oral cavity with significant pooling of secretions in anterior sulci throughout the study. Episode of silent aspiration with nectar thick suspected  after the swallow and was observed when fluoro turned on with cued cough appearing to clear trachea. During initial swallows his epiglottis adequately inverted however there was consistent penetration during multiple subsequent swallows attempting to clear oral/pharyngeal residue of thin (PAS 3,5), nectar and honey thick (PAS 3.5) with suspicion of feeding tube impacting inversion. Postural strategy was ineffective to prevent penetration and cues to cough only temporarily cleared vestibule. There was  no esophageal retention observed. Recommend continue NPO, allowing ice chips after oral care. Recommend po trials with puree consistency with ST only for facilitation of musculature/oral clearance and removing Cortrak prior to repeat MBS. Factors that may increase risk of adverse event in presence of aspiration Noe & Lianne 2021): Factors that may increase risk of adverse event in presence of aspiration Noe & Lianne 2021): Reduced cognitive function Recommendations/Plan: Swallowing Evaluation Recommendations Swallowing Evaluation Recommendations Recommendations: NPO (except ice chips) Medication Administration: Via alternative means Oral care recommendations: Oral care QID (4x/day); Oral care before ice chips/water Treatment Plan Treatment Plan Treatment recommendations: Therapy as outlined in treatment plan below Follow-up recommendations: -- (TBD) Functional status assessment: Patient has had a recent decline in their functional status and demonstrates the ability to make significant improvements in function in a reasonable and predictable amount of time. Treatment frequency: Min 2x/week Interventions: Trials of upgraded texture/liquids; Patient/family education Recommendations Recommendations for follow up therapy are one component of a multi-disciplinary discharge planning process, led by the attending physician.  Recommendations may be updated based on patient status, additional functional criteria and insurance authorization. Assessment: Orofacial Exam: Orofacial Exam Oral Cavity: Oral Hygiene: Lingual coating Oral Cavity - Dentition: Adequate natural dentition Orofacial Anatomy: WFL Oral Motor/Sensory Function: WFL Anatomy: No data recorded Boluses Administered: Boluses Administered Boluses Administered: Thin liquids (Level 0); Mildly thick liquids (Level 2, nectar thick); Moderately thick liquids (Level 3, honey thick); Puree; Solid  Oral Impairment Domain: Oral Impairment Domain Lip Closure:  Escape beyond mid-chin Tongue control during bolus hold: Escape to lateral buccal cavity/floor of mouth (trace) Bolus preparation/mastication: Slow prolonged chewing/mashing with complete recollection Bolus transport/lingual motion: Delayed initiation of tongue motion (oral holding) Oral residue: Residue collection on oral structures Location of oral residue : Floor of mouth; Tongue; Lateral sulci (lateral sulci with thin) Initiation of pharyngeal swallow : Valleculae  Pharyngeal Impairment Domain: Pharyngeal Impairment  Domain Soft palate elevation: No bolus between soft palate (SP)/pharyngeal wall (PW) Laryngeal elevation: Partial superior movement of thyroid  cartilage/partial approximation of arytenoids to epiglottic petiole Anterior hyoid excursion: Partial anterior movement Epiglottic movement: Complete inversion Laryngeal vestibule closure: Incomplete, narrow column air/contrast in laryngeal vestibule Pharyngeal stripping wave : Present - complete Pharyngeal contraction (A/P view only): N/A Pharyngoesophageal segment opening: Partial distention/partial duration, partial obstruction of flow Tongue base retraction: Trace column of contrast or air between tongue base and PPW Pharyngeal residue: Collection of residue within or on pharyngeal structures Location of pharyngeal residue: Tongue base; Pyriform sinuses; Valleculae  Esophageal Impairment Domain: Esophageal Impairment Domain Esophageal clearance upright position: Complete clearance, esophageal coating Pill: Pill Consistency administered: Puree Puree: WFL Penetration/Aspiration Scale Score: Penetration/Aspiration Scale Score 1.  Material does not enter airway: Solid; Puree 3.  Material enters airway, remains ABOVE vocal cords and not ejected out: Thin liquids (Level 0); Moderately thick liquids (Level 3, honey thick) 5.  Material enters airway, CONTACTS cords and not ejected out: Thin liquids (Level 0); Moderately thick liquids (Level 3, honey thick) 8.   Material enters airway, passes BELOW cords without attempt by patient to eject out (silent aspiration) : Mildly thick liquids (Level 2, nectar thick) (thinned from residue in mouth and penetrated and aspirated after the swallow) Compensatory Strategies: Compensatory Strategies Compensatory strategies: Yes Straw: Ineffective Ineffective Straw: Mildly thick liquid (Level 2, nectar thick) Chin tuck: Ineffective Ineffective Chin Tuck: Thin liquid (Level 0)   General Information: Caregiver present: No  Diet Prior to this Study: NPO; Cortrak/Small bore NG tube   Temperature : Normal   Respiratory Status: WFL   Supplemental O2: None (Room air)   History of Recent Intubation: No  Behavior/Cognition: Alert; Cooperative; Pleasant mood; Requires cueing Self-Feeding Abilities: Able to self-feed Baseline vocal quality/speech: Normal Volitional Cough: Able to elicit Volitional Swallow: Able to elicit Exam Limitations: No limitations Goal Planning: Prognosis for improved oropharyngeal function: Good Barriers to Reach Goals: Cognitive deficits No data recorded No data recorded Consulted and agree with results and recommendations: Patient; Physician; Nurse Pain: Pain Assessment Pain Assessment: No/denies pain End of Session: Start Time:SLP Start Time (ACUTE ONLY): 1240 Stop Time: SLP Stop Time (ACUTE ONLY): 1301 Time Calculation:SLP Time Calculation (min) (ACUTE ONLY): 21 min Charges: SLP Evaluations $ SLP Speech Visit: 1 Visit SLP Evaluations $MBS Swallow: 1 Procedure $Swallowing Treatment: 1 Procedure SLP visit diagnosis: SLP Visit Diagnosis: Dysphagia, oropharyngeal phase (R13.12) Past Medical History: Past Medical History: Diagnosis Date  Alcohol abuse 07/24/2010  B12 deficiency   Blood dyscrasia   hemachromatosis  BMI 25.0-25.9,adult 01/28/2015  Bronchitis   Degenerative joint disease (DJD) of hip   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  Depression   Essential hypertension 04/26/2010  Qualifier: Diagnosis of   By:  Gwenn Grimes        GERD (gastroesophageal reflux disease)   Gout   Gout 04/26/2010  Qualifier: Diagnosis of   By: Gwenn Grimes        Hemochromatosis 10/03/2013  History of DVT (deep vein thrombosis) 05/15/2016  History of kidney stones   HLD (hyperlipidemia)   HTN (hypertension)   Hyperlipidemia 04/26/2010  Qualifier: Diagnosis of   By: Gwenn Grimes        Hypokalemia 03/17/2016  Hypothyroidism   Insomnia 02/05/2014  QT prolongation 03/20/2015  Severe alcohol use disorder (HCC) 07/21/2023  Ventricular tachycardia (HCC)   in setting of hypokalemia/ hypomagnesemia and ETOH  Vitamin B12 deficiency 04/26/2010  Qualifier: Diagnosis of   By:  Gwenn Grimes        Wears glasses  Past Surgical History: Past Surgical History: Procedure Laterality Date  TOTAL HIP ARTHROPLASTY Right   TOTAL HIP ARTHROPLASTY Left 03/16/2016  Procedure: TOTAL HIP ARTHROPLASTY;  Surgeon: Shari Sieving, MD;  Location: [facility] OR;  Service: Orthopedics;  Laterality: Left;  vascularized fibular graft    right hip for avascular necrosis Dustin Olam Bull 12/02/2023, 3:15 PM  DG Chest Port 1 View Result Date: 12/02/2023 CLINICAL DATA:  141880 SOB (shortness of breath) [id number] EXAM: PORTABLE CHEST 1 VIEW COMPARISON:  December 01, 2023 FINDINGS: The cardiomediastinal silhouette is unchanged in contour. The enteric tube courses through the chest to the abdomen beyond the field-of-view. No pleural effusion. No significant pneumothorax. No acute pleuroparenchymal abnormality. Revisualization of LEFT tenth rib fracture. IMPRESSION: No acute cardiopulmonary abnormality. Electronically Signed   By: Corean Salter M.D.   On: 12/02/2023 07:51   DG Chest Port 1V same Day Result Date: 12/01/2023 EXAM: 1 VIEW(S) XRAY OF THE CHEST 12/01/2023 07:52:00 AM COMPARISON: 11/30/2023 CLINICAL HISTORY: SOB (shortness of breath) 141880. Reason for exam: pt order states SOB (shortness of breath) FINDINGS: LINES, TUBES AND DEVICES: Enteric tube in  place with tip below hemidiaphragm, collimated off view. LUNGS AND PLEURA: No focal pulmonary opacity. No pulmonary edema. No pleural effusion. No pneumothorax. HEART AND MEDIASTINUM: No acute abnormality of the cardiac and mediastinal silhouettes. BONES AND SOFT TISSUES: Left 10 th posterior rib fracture, unchanged. Better seen on recent CT of the chest from 11/24/21 IMPRESSION: 1. No acute cardiopulmonary abnormality detected 2. Left rib fracture, better characterized on prior CT chest Electronically signed by: Waddell Calk MD 12/01/2023 08:13 AM EDT RP Workstation: [id number]   DG Chest Port 1 View Result Date: 11/30/2023 EXAM: 1 VIEW(S) XRAY OF THE CHEST 11/30/2023 07:00:00 AM COMPARISON: 11/10/2023 and 11/25/2023 CLINICAL HISTORY: SOB (shortness of breath) FINDINGS: LINES, TUBES AND DEVICES: Enteric tube in place below diaphragm with tip collimated off view. LUNGS AND PLEURA: No focal pulmonary opacity. No pulmonary edema. No pleural effusion. No pneumothorax. HEART AND MEDIASTINUM: No acute abnormality of the cardiac and mediastinal silhouettes. BONES AND SOFT TISSUES: Suboptimal visualization of recently noted left 10th rib fractures. IMPRESSION: 1. No acute cardiopulmonary process detected. 2. Enteric tube present with which courses below the level of the hemidiaphragms. Tip not visualized. Electronically signed by: Waddell Calk MD 11/30/2023 07:45 AM EDT RP Workstation: [id number]   CT HEAD WO CONTRAST ( ) Result Date: 11/27/2023 CLINICAL DATA:  Facial trauma, blunt; Polytrauma, blunt EXAM: CT HEAD WITHOUT CONTRAST CT MAXILLOFACIAL WITHOUT CONTRAST TECHNIQUE: Multidetector CT imaging of the head and maxillofacial structures were performed using the standard protocol without intravenous contrast. Multiplanar CT image reconstructions of the maxillofacial structures were also generated. RADIATION DOSE REDUCTION: This exam was performed according to the departmental dose-optimization program which  includes automated exposure control, adjustment of the mA and/or kV according to patient size and/or use of iterative reconstruction technique. 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 Stable mass effect with effacement of the sulci of the left cerebral hemisphere and 2 mm left right midline shift. Stable subarachnoid hemorrhage. No interval hemorrhage identified. Ventricular size is normal. Cerebellum is unremarkable. No evidence of acute infarct. New Vascular: No hyperdense vessel or unexpected calcification. Skull: Normal. Negative for fracture or focal lesion. Other: Mastoid air cells and middle ear cavities are clear. CT  MAXILLOFACIAL FINDINGS Osseous: No fracture or mandibular dislocation. No destructive process. Orbits: Negative. No traumatic or inflammatory finding. Sinuses: Clear. Soft tissues: Soft tissue swelling superficial to the mandibular mentum noted slightly progressive since prior examination. IMPRESSION: 1. 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 Stable mass effect with effacement of the sulci of the left cerebral hemisphere and 2 mm left right midline shift. 2. Stable subarachnoid hemorrhage. No interval hemorrhage identified. 3. No acute facial bone fracture. 4. Soft tissue swelling superficial to the mandibular mentum, slightly progressive since prior examination. Electronically Signed   By: Dorethia Molt M.D.   On: 11/27/2023 21:36   CT MAXILLOFACIAL WO CONTRAST Result Date: 11/27/2023 CLINICAL DATA:  Facial trauma, blunt; Polytrauma, blunt EXAM: CT HEAD WITHOUT CONTRAST CT MAXILLOFACIAL WITHOUT CONTRAST TECHNIQUE: Multidetector CT imaging of the head and maxillofacial structures were performed using  the standard protocol without intravenous contrast. Multiplanar CT image reconstructions of the maxillofacial structures were also generated. RADIATION DOSE REDUCTION: This exam was performed according to the departmental dose-optimization program which includes automated exposure control, adjustment of the mA and/or kV according to patient size and/or use of iterative reconstruction technique. 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 Stable mass effect with effacement of the sulci of the left cerebral hemisphere and 2 mm left right midline shift. Stable subarachnoid hemorrhage. No interval hemorrhage identified. Ventricular size is normal. Cerebellum is unremarkable. No evidence of acute infarct. New Vascular: No hyperdense vessel or unexpected calcification. Skull: Normal. Negative for fracture or focal lesion. Other: Mastoid air cells and middle ear cavities are clear. CT MAXILLOFACIAL FINDINGS Osseous: No fracture or mandibular dislocation. No destructive process. Orbits: Negative. No traumatic or inflammatory finding. Sinuses: Clear. Soft tissues: Soft tissue swelling superficial to the mandibular mentum noted slightly progressive since prior examination. IMPRESSION: 1. 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 Stable mass effect with effacement of the sulci of the left cerebral hemisphere and 2 mm left right midline shift. 2. Stable subarachnoid hemorrhage. No interval hemorrhage identified. 3. No acute facial bone fracture. 4. Soft tissue swelling superficial to the mandibular mentum, slightly progressive since prior examination. Electronically Signed   By: Dorethia Molt M.D.   On: 11/27/2023 21:36   EEG adult Result  Date: 11/27/2023 Shelton Arlin KIDD, MD     11/29/2023  8:37 AM Patient Name: ITAMAR MCGOWAN MRN: [id number] Epilepsy Attending: Arlin KIDD Shelton Referring Physician/Provider: Khaliqdina, Salman, MD Date: 11/27/2023 Duration: 22.22 mins Patient history: 53 y.o. male ith hx of chronic EtOh use, GERD, gout, HLD who presented to [facility] ED after fall along with a L facial droop. CT head with  with new edema in the anterior left temporal lobe along with small amount of subarachnoid hemorrhage within a single sulcus of the right frontal lobe and two sulci of the left frontal lobe. EEG to evaluate for seizure Level of alertness: Awake AEDs during EEG study: Phenobarb, ativan  Technical aspects: This EEG study was done with scalp electrodes positioned according to the 10-20 International system of electrode placement. Electrical activity was reviewed with band pass filter of 1-70Hz , sensitivity of 7 uV/mm, display speed of 58mm/sec with a 60Hz  notched filter applied as appropriate. EEG  data were recorded continuously and digitally stored.  Video monitoring was available and reviewed as appropriate. Description: The posterior dominant rhythm consists of 8 Hz activity of moderate voltage (25-35 uV) seen predominantly in posterior head regions, symmetric and reactive to eye opening and eye closing. EEG showed intermittent generalized 3 to 6 Hz theta-delta slowing. Hyperventilation and photic stimulation were not performed.   ABNORMALITY - Intermittent slow, generalized IMPRESSION: This study is suggestive of mild diffuse encephalopathy. No seizures or epileptiform discharges were seen throughout the recording. Arlin MALVA Krebs   MR Brain W and Wo Contrast Result Date: 11/26/2023 EXAM: MRI BRAIN WITH AND WITHOUT CONTRAST 11/26/2023 07:54:39 AM TECHNIQUE: Multiplanar multisequence MRI of the head/brain was performed with and without the administration of intravenous contrast. COMPARISON: Head CT and CTA head and neck  yesterday, prior head CT 11/10/2023, and earlier. CLINICAL HISTORY: 53 year old male clinical history includes multiple recent falls. FINDINGS: BRAIN AND VENTRICLES: Bilateral intracranial hemorrhage. Broad based left sided subdural hematoma measures up to 7 mm in thickness, isointense on FLAIR (series 11 image 17 and series 17 image 15). Smaller contralateral right sided subdural hematoma measures up to 5 mm in thickness and is most pronounced along the right anterior frontal lobe convexity. Scattered bilateral subarachnoid hemorrhage in both hemispheres. SWI demonstrates the blood products mentioned above plus occasional chronic microhemorrhage in the brain (right parietal lobe white matter). No intraventricular hemorrhage. Patchy and confluent combined abnormal diffusion T2 and FLAIR hyperintensity at the right frontal operculum most resembles a cerebral contusion in the setting of acute intracranial hemorrhage. This appears to be a largely nonhemorrhagic cerebral contusion. Similar up to 3.5 cm contusion of the anterior left temporal lobe with heterogeneous T2 and FLAIR hyperintensity (series 11 image 9). Following contrast both areas demonstrate abnormal enhancement that is both patchy and cortical, also with mild adjacent dural thickening and enhancement. Mild rightward midline shift of 3 mm, appears stable from yesterday. No ventriculomegaly. Basilar cisterns remain patent. Widespread susceptibility on DWI and signal abnormality felt to be related to cerebral contusions with no convincing evidence of acute infarct. No definite chronic cortical encephalomalacia. The sella is unremarkable. Normal flow voids. ORBITS: No acute abnormality. SINUSES: Mild new paranasal sinus mucosal thickening. Trace fluid in the mastoid air cells. BONES AND SOFT TISSUES: Normal bone marrow signal and enhancement. No acute soft tissue abnormality. IMPRESSION: 1. Abnormal brain MRI most compatible with multifocal sequelae of recent  traumatic injury: 2. Multifocal Bilateral subarachnoid hemorrhages. Bilateral subdural hematoma ( 7 mm on the left, 5 mm on the right). 3. Abnormal anterior left temporal lobe and right frontal operculum most likely are cerebral contusions with edema and enhancement. Recommend repeat brain MRI without and with contrast in 4 to 6 weeks to document expected evolution. 4. Subsequent rightward midline shift of 3 mm is stable. 5. No convincing acute infarct. Electronically signed by: Helayne Hurst MD 11/26/2023 08:15 AM EDT RP Workstation: [id number]   CT Maxillofacial Wo Contrast Result Date: 11/25/2023 EXAM: CT OF THE FACE WITHOUT CONTRAST 11/25/2023 10:15:03 PM TECHNIQUE: CT of the face was performed without the administration of intravenous contrast. Multiplanar reformatted images are provided for review. Automated exposure control, iterative reconstruction, and/or weight based adjustment of the mA/kV was utilized to reduce the radiation dose to as low as reasonably achievable. COMPARISON: None available. CLINICAL HISTORY: Facial trauma. Complains of left sided facial droop and only being able to drink from right side of mouth and drooling out of other side. Patient has also  been getting choked on liquids, has slurred speech, and lives alone. Patient's aunt reports multiple falls and that all of these symptoms started Wednesday and have progressively gotten worse. Aunt says she could not get patient to come get checked out. FINDINGS: FACIAL BONES: No acute facial fracture. No mandibular dislocation. No suspicious bone lesion. ORBITS: Globes are intact. No acute traumatic injury. No inflammatory change. SINUSES AND MASTOIDS: No acute abnormality. SOFT TISSUES: No acute abnormality. IMPRESSION: 1. No acute facial fracture. Electronically signed by: Franky Stanford MD 11/25/2023 10:44 PM EDT RP Workstation: [id number]   CT C-SPINE NO CHARGE Result Date: 11/25/2023 EXAM: CT CERVICAL SPINE WITHOUT CONTRAST 11/25/2023  10:15:03 PM TECHNIQUE: CT of the cervical spine was performed without the administration of intravenous contrast. Multiplanar reformatted images are provided for review. Automated exposure control, iterative reconstruction, and/or weight based adjustment of the mA/kV was utilized to reduce the radiation dose to as low as reasonably achievable. COMPARISON: None available. CLINICAL HISTORY: c/o left sided facial droop and only being able to drink from right side of mouth and drooling out of other side. Pt has also been getting choked on liquids, pt also has slurred speech, pt bib Aunt, pt lives alone. Pt Aunt says pt has had multiple falls, Aunt reports all of these symptoms started Wednesday and have progressively gotten worse. Aunt says she could not get pt to come get checked out. FINDINGS: BONES AND ALIGNMENT: No acute fracture or traumatic malalignment. DEGENERATIVE CHANGES: No significant degenerative changes. SOFT TISSUES: No prevertebral soft tissue swelling. IMPRESSION: 1. No acute abnormality of the cervical spine. Electronically signed by: Franky Stanford MD 11/25/2023 10:42 PM EDT RP Workstation: [id number]   CT ANGIO HEAD NECK W WO CM Result Date: 11/25/2023 EXAM: CTA Head and Neck with and without Intravenous Contrast. CT Head without Contrast. CLINICAL HISTORY: Acute neuro deficit, stroke suspected. Symptoms include left-sided facial droop, difficulty speaking (slurred speech), difficulty swallowing liquids (choking), and drooling. Symptoms started Wednesday and have progressively worsened. History of multiple falls. TECHNIQUE: Axial CTA images of the head and neck performed with and without intravenous contrast. MIP reconstructed images were created and reviewed. Axial computed tomography images of the head/brain performed without intravenous contrast. Note: Per PQRS, the description of internal carotid artery percent stenosis, including 0 percent or normal exam, is based on [place]  Symptomatic Carotid Endarterectomy Trial (NASCET) criteria. Dose reduction technique was used including one or more of the following: automated exposure control, adjustment of mA and kV according to patient size, and/or iterative reconstruction. CONTRAST: With and without; 75 mL iohexol  (OMNIPAQUE ) 350 MG/ML injection. COMPARISON: 11/10/2023 FINDINGS: CT HEAD: BRAIN: There is a small amount of subarachnoid hemorrhage within a single sulcus of the right frontal lobe and 2 sulci of the left frontal lobe. Edema in the anterior left temporal lobe is new since 11/10/2023. 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 No acute intraparenchymal hemorrhage. No mass lesion. No CT evidence for acute territorial infarct. No midline shift. VENTRICLES: No hydrocephalus. ORBITS: The orbits are unremarkable. SINUSES AND MASTOIDS: The paranasal sinuses and mastoid air cells are clear. CTA NECK: COMMON CAROTID ARTERIES: No significant stenosis. No dissection or occlusion. INTERNAL CAROTID ARTERIES: No stenosis by NASCET criteria. No dissection or occlusion. VERTEBRAL ARTERIES: No significant stenosis. No dissection or occlusion. CTA HEAD: ANTERIOR CEREBRAL ARTERIES: No significant stenosis. No  occlusion. No aneurysm. MIDDLE CEREBRAL ARTERIES: No significant stenosis. No occlusion. No aneurysm. POSTERIOR CEREBRAL ARTERIES: No significant stenosis. No occlusion. No aneurysm. BASILAR ARTERY: No significant stenosis. No occlusion. No aneurysm. OTHER: SOFT TISSUES: No acute finding. No masses or lymphadenopathy. BONES: No acute osseous abnormality. IMPRESSION: 1. Small amount of subarachnoid hemorrhage within a single sulcus of the right frontal lobe and two sulci of the left frontal lobe. 2. New edema in the anterior left temporal lobe since 11/10/23. MRI with and without  contrast recommended for further characterization. 3. 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 4. 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 5. No emergent large vessel occlusion or high grade stenosis. Findings communicated to Dr. Yolande at 10:41pm on 11/25/2023. Electronically signed by: Franky Stanford MD 11/25/2023 10:41 PM EDT RP Workstation: [id number]   CT Chest Wo Contrast Result Date: 11/25/2023 CLINICAL DATA:  Recent fall with chest pain, initial encounter EXAM: CT CHEST WITHOUT CONTRAST TECHNIQUE: Multidetector CT imaging of the chest was performed following the standard protocol without IV contrast. RADIATION DOSE REDUCTION: This exam was performed according to the departmental dose-optimization program which includes automated exposure control, adjustment of the mA and/or kV according to patient size and/or use of iterative reconstruction technique. COMPARISON:  11/10/2023 FINDINGS: Cardiovascular: Somewhat limited due to lack of IV contrast. No aneurysmal dilatation is seen. Atherosclerotic calcifications are noted. No pericardial effusion is seen. Mild coronary calcifications are noted. Mediastinum/Nodes: Thoracic inlet is within normal limits. No hilar or mediastinal adenopathy is noted. The esophagus as visualized is within normal limits. Lungs/Pleura: Lungs are well aerated bilaterally. No focal infiltrate or sizable effusion is seen. No pneumothorax is noted. Upper Abdomen: Cholelithiasis without complicating factors. Musculoskeletal: Left tenth rib fracture is noted posteriorly. No complicating factors are seen. IMPRESSION: Left tenth rib fracture without complicating factors. No other acute abnormality is noted. Aortic Atherosclerosis (ICD10-I70.0). Electronically Signed   By: Oneil Devonshire M.D.   On: 11/25/2023 22:24   DG Hip Unilat W or Wo Pelvis 2-3 Views Right Result Date: 11/25/2023 CLINICAL  DATA:  Recent fall with right hip pain, initial encounter EXAM: DG HIP (WITH OR WITHOUT PELVIS) 3V RIGHT COMPARISON:  03/08/2006 FINDINGS: Bilateral hip replacements are noted. Pelvic ring appears intact. Contrast is noted within the renal collecting systems. No acute fracture or dislocation is noted. IMPRESSION: Postsurgical changes without acute abnormality. Electronically Signed   By: Oneil Devonshire M.D.   On: 11/25/2023 22:19   DG Hand Complete Left Result Date: 11/25/2023 CLINICAL DATA:  Left hand pain, no known injury EXAM: LEFT HAND - COMPLETE 3+ VIEW COMPARISON:  None Available. FINDINGS: There is no evidence of fracture or dislocation. There is no evidence of arthropathy or other focal bone abnormality. Soft tissues are unremarkable. IMPRESSION: No acute abnormality noted. Electronically Signed   By: Oneil Devonshire M.D.   On: 11/25/2023 22:18   CT Head Wo Contrast Result Date: 11/10/2023 EXAM: CT HEAD WITHOUT CONTRAST 11/10/2023 01:29:22 AM TECHNIQUE: CT of the head was performed without the administration of intravenous contrast. Automated exposure control, iterative reconstruction, and/or weight based adjustment of the mA/kV was utilized to reduce the radiation dose to as low as reasonably achievable. COMPARISON: 07/19/2023 CLINICAL HISTORY: Head trauma, moderate-severe. Pt bib [facility] from home for a fall, unknown LOC. Hematoma noted to head and laceration right side of head, bleeding controlled. No blood thinners. ETOH on board, per EMS pt stated he has  had about a gallon of liquor. EMS states that family at scene; states that pt had told them that he was going to blow his head off because he wants to die. FINDINGS: BRAIN AND VENTRICLES: No acute hemorrhage. No evidence of acute infarct. No hydrocephalus. No extra-axial collection. No mass effect or midline shift. Mild global cortical atrophy. ORBITS: No acute abnormality. SINUSES: No acute abnormality. SOFT TISSUES AND SKULL: Mild soft tissue  swelling overlying the right frontal bone. No skull fracture. IMPRESSION: 1. No acute intracranial abnormality. 2. Mild soft tissue swelling overlying the right frontal bone. Electronically signed by: Pinkie Pebbles MD 11/10/2023 01:40 AM EDT RP Workstation: [id number]   CT Cervical Spine Wo Contrast Result Date: 11/10/2023 EXAM: CT CERVICAL SPINE WITHOUT CONTRAST 11/10/2023 01:29:22 AM TECHNIQUE: CT of the cervical spine was performed without the administration of intravenous contrast. Multiplanar reformatted images are provided for review. Automated exposure control, iterative reconstruction, and/or weight based adjustment of the mA/kV was utilized to reduce the radiation dose to as low as reasonably achievable. COMPARISON: None available. CLINICAL HISTORY: Neck trauma, dangerous injury mechanism (Age 49-64y). Pt bib [facility] from home for a fall, unknown LOC. Hematoma noted to head and laceration right side of head, bleeding controlled. No blood thinners. ETOH on board, per EMS pt stated he has had about a gallon of liquor. EMS states that family at scene; states that pt had told them that he was going to blow his head off because he wants to die. FINDINGS: CERVICAL SPINE: BONES AND ALIGNMENT: No acute fracture or traumatic malalignment. DEGENERATIVE CHANGES: Mild degenerative changes, most prominent at C3-4 and C6-7. SOFT TISSUES: No prevertebral soft tissue swelling. IMPRESSION: 1. No acute abnormality of the cervical spine. Electronically signed by: Pinkie Pebbles MD 11/10/2023 01:38 AM EDT RP Workstation: [id number]   CT CHEST ABDOMEN PELVIS W CONTRAST Result Date: 11/10/2023 EXAM: CT CHEST, ABDOMEN AND PELVIS WITH CONTRAST 11/10/2023 01:29:22 AM TECHNIQUE: CT of the chest, abdomen and pelvis was performed with the administration of intravenous contrast. Multiplanar reformatted images are provided for review. Automated exposure control, iterative reconstruction, and/or weight based adjustment of  the mA/kV was utilized to reduce the radiation dose to as low as reasonably achievable. COMPARISON: None available. CLINICAL HISTORY: Polytrauma, blunt. Pt bib [facility] from home for a fall, unknown LOC. Hematoma noted to head and laceration right side of head, bleeding controlled. No blood thinners. ETOH on board, per EMS pt stated he has had about a gallon of liquor. EMS states that family at scene; states that pt had told them that he was going to blow his head off because he wants to die. FINDINGS: CHEST: MEDIASTINUM AND LYMPH NODES: Heart and pericardium are unremarkable. The central airways are clear. No mediastinal, hilar or axillary lymphadenopathy. No evidence of traumatic aortic injury. Mild thoracic aortic atherosclerosis. Mild coronary atherosclerosis of the left circumflex. LUNGS AND PLEURA: Mild patchy opacities in the bilateral lower lobes, favoring atelectasis and/or postinfectious/inflammatory scarring. No pleural effusion or pneumothorax. ABDOMEN AND PELVIS: LIVER: The liver is unremarkable. GALLBLADDER AND BILE DUCTS: Layering tiny gallstone (image 62), without associated inflammatory changes. No biliary ductal dilatation. SPLEEN: No acute abnormality. PANCREAS: No acute abnormality. ADRENAL GLANDS: No acute abnormality. KIDNEYS, URETERS AND BLADDER: No stones in the kidneys or ureters. No hydronephrosis. No perinephric or periureteral stranding. Moderately distended bladder. GI AND BOWEL: Normal appendix (image 86). Stomach demonstrates no acute abnormality. There is no bowel obstruction. REPRODUCTIVE ORGANS: Prostate is unremarkable. PERITONEUM AND RETROPERITONEUM: No hemoperitoneum or free  air. VASCULATURE: Atherosclerotic calcifications of the abdominal aorta and branch vessels. ABDOMINAL AND PELVIS LYMPH NODES: No lymphadenopathy. REPRODUCTIVE ORGANS: No acute abnormality. BONES AND SOFT TISSUES: No fracture is seen. Sternum, clavicles, and scapulae are intact. Thoracolumbar spine is within  normal limits. Bilateral hip arthroplasties. IMPRESSION: 1. No traumatic injury to the chest, abdomen, or pelvis. Electronically signed by: Pinkie Pebbles MD 11/10/2023 01:37 AM EDT RP Workstation: [id number]     TODAY-DAY OF DISCHARGE:  Subjective:   Ozell Pouch today has no headache,no chest abdominal pain,no new weakness tingling or numbness, feels much better wants to go home today.   Objective:   Blood pressure 122/76, pulse 67, temperature 98.9 F (37.2 C), temperature source Oral, resp. rate 14, height 5' 9 (1.753 m), weight 70.1 kg, SpO2 100%. No intake or output data in the 24 hours ending 12/07/23 0854 Filed Weights   12/05/23 0429 12/06/23 0442 12/07/23 0450  Weight: 69.9 kg 70.5 kg 70.1 kg    Exam: Awake Alert, Oriented *3, No new F.N deficits, Normal affect [place].AT,PERRAL Supple Neck,No JVD, No cervical lymphadenopathy appriciated.  Symmetrical Chest wall movement, Good air movement bilaterally, CTAB RRR,No Gallops,Rubs or new Murmurs, No Parasternal Heave +ve B.Sounds, Abd Soft, Non tender, No organomegaly appriciated, No rebound -guarding or rigidity. No Cyanosis, Clubbing or edema, No new Rash or bruise   PERTINENT RADIOLOGIC STUDIES: DG Swallowing Func-Speech Pathology Result Date: 12/06/2023 Table formatting from the original result was not included. Modified Barium Swallow Study Patient Details Name: Christian King MRN: [id number] Date of Birth: [birth year] Today's Date: 12/06/2023 HPI/PMH: HPI: 53yo male admitted 11/25/23 after a fall, left facial droop, bilateral SAH and SDH. PMH: severe alcohol use disorder, HLD, depression, GERD, HTN, hypothyroidism, insomnia, hip replacements Clinical Impression: Pt exhibits significant improvements related to oral cohesion, pharyngeal clearance, and airway protection. Cortrak was removed earlier this date and he now exhibits functional oropharyngeal swallowing with only transient penetration of thin liquids (PAS 2,  considered WFL). He achieves consistent epiglottic inversion with complete laryngeal closure, preventing further penetration/aspiration with subsequent trials of liquids and solids. Administered the 13 mm barium tablet with thin liquids, resulting in brief retention in the pyriform sinuses before clearing from the pharynx and esophagus completely after a subswallow. Recommend regular solids and thin liquids with full supervision to cue him to follow large bites/pills with liquid. SLP will continue following. Factors that may increase risk of adverse event in presence of aspiration Noe & Lianne 2021): Factors that may increase risk of adverse event in presence of aspiration Noe & Lianne 2021): Reduced cognitive function Recommendations/Plan: Swallowing Evaluation Recommendations Swallowing Evaluation Recommendations Recommendations: PO diet PO Diet Recommendation: Regular; Thin liquids (Level 0) Liquid Administration via: Cup; Straw Medication Administration: Whole meds with liquid Supervision: Patient able to self-feed; Full supervision/cueing for swallowing strategies Swallowing strategies  : Minimize environmental distractions; Slow rate; Small bites/sips; Follow solids with liquids Postural changes: Position pt fully upright for meals Oral care recommendations: Oral care BID (2x/day) Treatment Plan Treatment Plan Treatment recommendations: Therapy as outlined in treatment plan below Follow-up recommendations: Acute inpatient rehab (3 hours/day) Functional status assessment: Patient has had a recent decline in their functional status and demonstrates the ability to make significant improvements in function in a reasonable and predictable amount of time. Treatment frequency: Min 2x/week Treatment duration: 2 weeks Interventions: Patient/family education; Diet toleration management by SLP Recommendations Recommendations for follow up therapy are one component of a multi-disciplinary discharge planning  process, led by the attending physician.  Recommendations may be updated based on patient status, additional functional criteria and insurance authorization. Assessment: Orofacial Exam: Orofacial Exam Oral Cavity: Oral Hygiene: Lingual coating Oral Cavity - Dentition: Adequate natural dentition Orofacial Anatomy: WFL Oral Motor/Sensory Function: WFL Anatomy: Anatomy: Suspected cervical osteophytes Boluses Administered: Boluses Administered Boluses Administered: Thin liquids (Level 0); Mildly thick liquids (Level 2, nectar thick); Moderately thick liquids (Level 3, honey thick); Puree; Solid  Oral Impairment Domain: Oral Impairment Domain Lip Closure: No labial escape Tongue control during bolus hold: Cohesive bolus between tongue to palatal seal Bolus preparation/mastication: Timely and efficient chewing and mashing Bolus transport/lingual motion: Brisk tongue motion Oral residue: Complete oral clearance Location of oral residue : N/A Initiation of pharyngeal swallow : Posterior angle of the ramus  Pharyngeal Impairment Domain: Pharyngeal Impairment Domain Soft palate elevation: No bolus between soft palate (SP)/pharyngeal wall (PW) Laryngeal elevation: Complete superior movement of thyroid  cartilage with complete approximation of arytenoids to epiglottic petiole Anterior hyoid excursion: Partial anterior movement Epiglottic movement: Complete inversion Laryngeal vestibule closure: Incomplete, narrow column air/contrast in laryngeal vestibule Pharyngeal stripping wave : Present - complete Pharyngeal contraction (A/P view only): N/A Pharyngoesophageal segment opening: Partial distention/partial duration, partial obstruction of flow Tongue base retraction: No contrast between tongue base and posterior pharyngeal wall (PPW) Pharyngeal residue: Trace residue within or on pharyngeal structures Location of pharyngeal residue: Valleculae; Pyriform sinuses  Esophageal Impairment Domain: Esophageal Impairment Domain  Esophageal clearance upright position: Complete clearance, esophageal coating Pill: Pill Consistency administered: Thin liquids (Level 0) Thin liquids (Level 0): [facility] Penetration/Aspiration Scale Score: Penetration/Aspiration Scale Score 1.  Material does not enter airway: Mildly thick liquids (Level 2, nectar thick); Moderately thick liquids (Level 3, honey thick); Puree; Solid; Pill 2.  Material enters airway, remains ABOVE vocal cords then ejected out: Thin liquids (Level 0) Compensatory Strategies: Compensatory Strategies Compensatory strategies: No   General Information: Caregiver present: No  Diet Prior to this Study: NPO; Cortrak/Small bore NG tube   Temperature : Normal   Respiratory Status: WFL   Supplemental O2: None (Room air)   History of Recent Intubation: No  Behavior/Cognition: Alert; Cooperative; Pleasant mood; Requires cueing Self-Feeding Abilities: Able to self-feed Baseline vocal quality/speech: Normal Volitional Cough: Able to elicit Volitional Swallow: Able to elicit Exam Limitations: No limitations Goal Planning: Prognosis for improved oropharyngeal function: Good Barriers to Reach Goals: Cognitive deficits No data recorded Patient/Family Stated Goal: wants to go home Consulted and agree with results and recommendations: Patient; Family member/caregiver; Physician Pain: Pain Assessment Pain Assessment: No/denies pain End of Session: Start Time:SLP Start Time (ACUTE ONLY): 0940 Stop Time: SLP Stop Time (ACUTE ONLY): 1000 Time Calculation:SLP Time Calculation (min) (ACUTE ONLY): 20 min Charges: SLP Evaluations $ SLP Speech Visit: 1 Visit SLP Evaluations $MBS Swallow: 1 Procedure $Swallowing Treatment: 1 Procedure $Speech Treatment for Individual: 1 Procedure SLP visit diagnosis: SLP Visit Diagnosis: Dysphagia, oropharyngeal phase (R13.12) Past Medical History: Past Medical History: Diagnosis Date  Alcohol abuse 07/24/2010  B12 deficiency   Blood dyscrasia   hemachromatosis  BMI 25.0-25.9,adult  01/28/2015  Bronchitis   Degenerative joint disease (DJD) of hip   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  Depression   Essential hypertension 04/26/2010  Qualifier: Diagnosis of   By: Gwenn Grimes        GERD (gastroesophageal reflux disease)   Gout   Gout 04/26/2010  Qualifier: Diagnosis of   By: Gwenn Grimes        Hemochromatosis 10/03/2013  History of DVT (deep vein thrombosis) 05/15/2016  History of kidney stones   HLD (hyperlipidemia)   HTN (hypertension)   Hyperlipidemia 04/26/2010  Qualifier: Diagnosis of   By: Gwenn Grimes        Hypokalemia 03/17/2016  Hypothyroidism   Insomnia 02/05/2014  QT prolongation 03/20/2015  Severe alcohol use disorder (HCC) 07/21/2023  Ventricular tachycardia (HCC)   in setting of hypokalemia/ hypomagnesemia and ETOH  Vitamin B12 deficiency 04/26/2010  Qualifier: Diagnosis of   By: Gwenn Grimes        Wears glasses  Past Surgical History: Past Surgical History: Procedure Laterality Date  TOTAL HIP ARTHROPLASTY Right   TOTAL HIP ARTHROPLASTY Left 03/16/2016  Procedure: TOTAL HIP ARTHROPLASTY;  Surgeon: Shari Sieving, MD;  Location: [facility] OR;  Service: Orthopedics;  Laterality: Left;  vascularized fibular graft    right hip for avascular necrosis Damien Blumenthal, M.A., CCC-SLP Speech Language Pathology, Acute Rehabilitation Services Secure Chat preferred [phone] 12/06/2023, 10:21 AM    PERTINENT LAB RESULTS: CBC: No results for input(s): WBC, HGB, HCT, PLT in the last 72 hours. CMET CMP     Component Value Date/Time   NA 135 12/06/2023 0328   NA 141 04/27/2022 1235   K 3.6 12/06/2023 0328   CL 99 12/06/2023 0328   CO2 25 12/06/2023 0328   GLUCOSE 127 (H) 12/06/2023 0328   BUN 11 12/06/2023 0328   BUN 17 04/27/2022 1235   CREATININE 0.64 12/06/2023 0328   CALCIUM  9.1 12/06/2023 0328   PROT 5.4 (L) 11/27/2023 0223   PROT 7.1 04/27/2022 1235   ALBUMIN 2.3 (L) 11/27/2023 0223   ALBUMIN 4.8 04/27/2022 1235   AST 20  11/27/2023 0223   ALT 14 11/27/2023 0223   ALKPHOS 40 11/27/2023 0223   BILITOT 1.1 11/27/2023 0223   BILITOT 0.5 04/27/2022 1235   GFR 92.07 02/08/2011 1231   EGFR 103 04/27/2022 1235   GFRNONAA >60 12/06/2023 0328    GFR Estimated Creatinine Clearance: 103.4 mL/min (by C-G formula based on SCr of 0.64 mg/dL). No results for input(s): LIPASE, AMYLASE in the last 72 hours. No results for input(s): CKTOTAL, CKMB, CKMBINDEX, TROPONINI in the last 72 hours. Invalid input(s): POCBNP No results for input(s): DDIMER in the last 72 hours. No results for input(s): HGBA1C in the last 72 hours. No results for input(s): CHOL, HDL, LDLCALC, TRIG, CHOLHDL, LDLDIRECT in the last 72 hours. No results for input(s): TSH, T4TOTAL, T3FREE, THYROIDAB in the last 72 hours.  Invalid input(s): FREET3 No results for input(s): VITAMINB12, FOLATE, FERRITIN, TIBC, IRON, RETICCTPCT in the last 72 hours. Coags: No results for input(s): INR in the last 72 hours.  Invalid input(s): PT Microbiology: No results found for this or any previous visit (from the past 240 hours).  FURTHER DISCHARGE INSTRUCTIONS:  Get Medicines reviewed and adjusted: Please take all your medications with you for your next visit with your Primary MD  Laboratory/radiological data: Please request your Primary MD to go over all hospital tests and procedure/radiological results at the follow up, please ask your Primary MD to get all Hospital records sent to his/her office.  In some cases, they will be blood work, cultures and biopsy results pending at the time of your discharge. Please request that your primary care M.D. goes through all the records of your hospital data and follows up on these results.  Also Note the following: If you experience worsening of your admission symptoms, develop shortness of breath, life threatening emergency, suicidal or homicidal thoughts you must  seek medical attention immediately by calling  [phone] or calling your MD immediately  if symptoms less severe.  You must read complete instructions/literature along with all the possible adverse reactions/side effects for all the Medicines you take and that have been prescribed to you. Take any new Medicines after you have completely understood and accpet all the possible adverse reactions/side effects.   Do not drive when taking Pain medications or sleeping medications (Benzodaizepines)  Do not take more than prescribed Pain, Sleep and Anxiety Medications. It is not advisable to combine anxiety,sleep and pain medications without talking with your primary care practitioner  Special Instructions: If you have smoked or chewed Tobacco  in the last 2 yrs please stop smoking, stop any regular Alcohol  and or any Recreational drug use.  Wear Seat belts while driving.  Please note: You were cared for by a hospitalist during your hospital stay. Once you are discharged, your primary care physician will handle any further medical issues. Please note that NO REFILLS for any discharge medications will be authorized once you are discharged, as it is imperative that you return to your primary care physician (or establish a relationship with a primary care physician if you do not have one) for your post hospital discharge needs so that they can reassess your need for medications and monitor your lab values.  Total Time spent coordinating discharge including counseling, education and face to face time equals greater than 30 minutes.  SignedBETHA Donalda Applebaum 12/07/2023 8:54 AM

## 2023-12-07 NOTE — Plan of Care (Signed)
 Pt has rested quietly throughout the night with no distress noted. Alert and oriented. On room air. SR on the monitor. Ate good supper. Taking po's well. Voids per urinal. No complaints voiced.     Problem: Education: Goal: Knowledge of General Education information will improve Description: Including pain rating scale, medication(s)/side effects and non-pharmacologic comfort measures Outcome: Progressing   Problem: Health Behavior/Discharge Planning: Goal: Ability to manage health-related needs will improve Outcome: Progressing   Problem: Clinical Measurements: Goal: Respiratory complications will improve Outcome: Progressing Goal: Cardiovascular complication will be avoided Outcome: Progressing   Problem: Activity: Goal: Risk for activity intolerance will decrease Outcome: Progressing   Problem: Nutrition: Goal: Adequate nutrition will be maintained Outcome: Progressing   Problem: Coping: Goal: Level of anxiety will decrease Outcome: Progressing   Problem: Pain Managment: Goal: General experience of comfort will improve and/or be controlled Outcome: Progressing

## 2023-12-09 ENCOUNTER — Other Ambulatory Visit (HOSPITAL_COMMUNITY): Payer: Self-pay

## 2023-12-12 ENCOUNTER — Encounter: Admitting: Physician Assistant

## 2023-12-14 ENCOUNTER — Other Ambulatory Visit (HOSPITAL_COMMUNITY): Payer: Self-pay
